# Patient Record
Sex: Female | Born: 1945 | Race: White | Hispanic: No | Smoking: Former smoker
Health system: Southern US, Community
[De-identification: ages and names within clinical notes are randomized; demographics above are authoritative.]

## PROBLEM LIST (undated history)

## (undated) DIAGNOSIS — I7090 Unspecified atherosclerosis: Secondary | ICD-10-CM

## (undated) DIAGNOSIS — N200 Calculus of kidney: Secondary | ICD-10-CM

## (undated) DIAGNOSIS — J439 Emphysema, unspecified: Secondary | ICD-10-CM

## (undated) DIAGNOSIS — J449 Chronic obstructive pulmonary disease, unspecified: Secondary | ICD-10-CM

## (undated) DIAGNOSIS — R7303 Prediabetes: Secondary | ICD-10-CM

## (undated) DIAGNOSIS — F419 Anxiety disorder, unspecified: Secondary | ICD-10-CM

## (undated) DIAGNOSIS — R030 Elevated blood-pressure reading, without diagnosis of hypertension: Secondary | ICD-10-CM

## (undated) DIAGNOSIS — K219 Gastro-esophageal reflux disease without esophagitis: Secondary | ICD-10-CM

## (undated) DIAGNOSIS — K635 Polyp of colon: Secondary | ICD-10-CM

## (undated) DIAGNOSIS — T7840XA Allergy, unspecified, initial encounter: Secondary | ICD-10-CM

## (undated) DIAGNOSIS — I10 Essential (primary) hypertension: Secondary | ICD-10-CM

## (undated) DIAGNOSIS — E785 Hyperlipidemia, unspecified: Secondary | ICD-10-CM

## (undated) DIAGNOSIS — H269 Unspecified cataract: Secondary | ICD-10-CM

## (undated) DIAGNOSIS — G4700 Insomnia, unspecified: Secondary | ICD-10-CM

## (undated) DIAGNOSIS — M940 Chondrocostal junction syndrome [Tietze]: Secondary | ICD-10-CM

## (undated) HISTORY — DX: Chondrocostal junction syndrome (tietze): M94.0

## (undated) HISTORY — DX: Anxiety disorder, unspecified: F41.9

## (undated) HISTORY — DX: Insomnia, unspecified: G47.00

## (undated) HISTORY — PX: ABDOMINAL HYSTERECTOMY: SHX81

## (undated) HISTORY — DX: Prediabetes: R73.03

## (undated) HISTORY — DX: Essential (primary) hypertension: I10

## (undated) HISTORY — DX: Calculus of kidney: N20.0

## (undated) HISTORY — DX: Allergy, unspecified, initial encounter: T78.40XA

## (undated) HISTORY — PX: POLYPECTOMY: SHX149

## (undated) HISTORY — DX: Unspecified cataract: H26.9

## (undated) HISTORY — DX: Emphysema, unspecified: J43.9

## (undated) HISTORY — DX: Polyp of colon: K63.5

## (undated) HISTORY — PX: OTHER SURGICAL HISTORY: SHX169

## (undated) HISTORY — DX: Gastro-esophageal reflux disease without esophagitis: K21.9

## (undated) HISTORY — DX: Hyperlipidemia, unspecified: E78.5

## (undated) HISTORY — PX: COLONOSCOPY: SHX174

---

## 1998-08-07 ENCOUNTER — Ambulatory Visit (HOSPITAL_COMMUNITY): Admission: RE | Admit: 1998-08-07 | Discharge: 1998-08-07 | Payer: Self-pay | Admitting: Internal Medicine

## 1999-03-15 ENCOUNTER — Ambulatory Visit (HOSPITAL_COMMUNITY): Admission: RE | Admit: 1999-03-15 | Discharge: 1999-03-15 | Payer: Self-pay | Admitting: Internal Medicine

## 1999-03-15 ENCOUNTER — Encounter: Payer: Self-pay | Admitting: Internal Medicine

## 1999-08-12 ENCOUNTER — Ambulatory Visit (HOSPITAL_COMMUNITY): Admission: RE | Admit: 1999-08-12 | Discharge: 1999-08-12 | Payer: Self-pay | Admitting: Internal Medicine

## 1999-08-12 ENCOUNTER — Encounter: Payer: Self-pay | Admitting: Internal Medicine

## 2000-08-18 ENCOUNTER — Ambulatory Visit (HOSPITAL_COMMUNITY): Admission: RE | Admit: 2000-08-18 | Discharge: 2000-08-18 | Payer: Self-pay | Admitting: Internal Medicine

## 2000-08-18 ENCOUNTER — Encounter: Payer: Self-pay | Admitting: Internal Medicine

## 2001-08-26 ENCOUNTER — Encounter: Payer: Self-pay | Admitting: Internal Medicine

## 2001-08-26 ENCOUNTER — Ambulatory Visit (HOSPITAL_COMMUNITY): Admission: RE | Admit: 2001-08-26 | Discharge: 2001-08-26 | Payer: Self-pay | Admitting: Internal Medicine

## 2002-09-19 ENCOUNTER — Encounter: Payer: Self-pay | Admitting: Internal Medicine

## 2002-09-19 ENCOUNTER — Ambulatory Visit (HOSPITAL_COMMUNITY): Admission: RE | Admit: 2002-09-19 | Discharge: 2002-09-19 | Payer: Self-pay | Admitting: Family Medicine

## 2003-02-06 ENCOUNTER — Encounter: Payer: Self-pay | Admitting: Internal Medicine

## 2003-02-06 ENCOUNTER — Ambulatory Visit (HOSPITAL_COMMUNITY): Admission: RE | Admit: 2003-02-06 | Discharge: 2003-02-06 | Payer: Self-pay | Admitting: Internal Medicine

## 2004-01-17 ENCOUNTER — Ambulatory Visit (HOSPITAL_COMMUNITY): Admission: RE | Admit: 2004-01-17 | Discharge: 2004-01-17 | Payer: Self-pay | Admitting: Internal Medicine

## 2005-01-20 ENCOUNTER — Ambulatory Visit (HOSPITAL_COMMUNITY): Admission: RE | Admit: 2005-01-20 | Discharge: 2005-01-20 | Payer: Self-pay | Admitting: Internal Medicine

## 2006-01-23 ENCOUNTER — Ambulatory Visit (HOSPITAL_COMMUNITY): Admission: RE | Admit: 2006-01-23 | Discharge: 2006-01-23 | Payer: Self-pay | Admitting: Internal Medicine

## 2006-08-04 ENCOUNTER — Ambulatory Visit: Payer: Self-pay | Admitting: Gastroenterology

## 2006-08-12 ENCOUNTER — Encounter: Payer: Self-pay | Admitting: Gastroenterology

## 2006-08-12 ENCOUNTER — Ambulatory Visit: Payer: Self-pay | Admitting: Gastroenterology

## 2007-01-25 ENCOUNTER — Ambulatory Visit (HOSPITAL_COMMUNITY): Admission: RE | Admit: 2007-01-25 | Discharge: 2007-01-25 | Payer: Self-pay | Admitting: Internal Medicine

## 2007-10-22 ENCOUNTER — Ambulatory Visit (HOSPITAL_COMMUNITY): Admission: RE | Admit: 2007-10-22 | Discharge: 2007-10-22 | Payer: Self-pay | Admitting: Internal Medicine

## 2008-02-15 ENCOUNTER — Ambulatory Visit (HOSPITAL_COMMUNITY): Admission: RE | Admit: 2008-02-15 | Discharge: 2008-02-15 | Payer: Self-pay | Admitting: Internal Medicine

## 2009-04-02 ENCOUNTER — Ambulatory Visit (HOSPITAL_COMMUNITY): Admission: RE | Admit: 2009-04-02 | Discharge: 2009-04-02 | Payer: Self-pay | Admitting: Internal Medicine

## 2010-04-17 ENCOUNTER — Ambulatory Visit (HOSPITAL_COMMUNITY): Admission: RE | Admit: 2010-04-17 | Discharge: 2010-04-17 | Payer: Self-pay | Admitting: Internal Medicine

## 2011-01-18 ENCOUNTER — Encounter: Payer: Self-pay | Admitting: Internal Medicine

## 2011-03-30 LAB — HM DEXA SCAN

## 2011-04-16 ENCOUNTER — Other Ambulatory Visit (HOSPITAL_COMMUNITY): Payer: Self-pay | Admitting: Internal Medicine

## 2011-04-16 DIAGNOSIS — R05 Cough: Secondary | ICD-10-CM

## 2011-04-16 DIAGNOSIS — Z13820 Encounter for screening for osteoporosis: Secondary | ICD-10-CM

## 2011-04-16 DIAGNOSIS — Z1231 Encounter for screening mammogram for malignant neoplasm of breast: Secondary | ICD-10-CM

## 2011-04-24 ENCOUNTER — Ambulatory Visit (HOSPITAL_COMMUNITY)
Admission: RE | Admit: 2011-04-24 | Discharge: 2011-04-24 | Disposition: A | Discharge status: Home / Self Care | Payer: Medicare Other | Source: Ambulatory Visit | Attending: Internal Medicine | Admitting: Internal Medicine

## 2011-04-24 ENCOUNTER — Ambulatory Visit (HOSPITAL_COMMUNITY): Payer: Self-pay

## 2011-04-24 ENCOUNTER — Other Ambulatory Visit (HOSPITAL_COMMUNITY): Payer: Self-pay

## 2011-04-24 DIAGNOSIS — Z13820 Encounter for screening for osteoporosis: Secondary | ICD-10-CM

## 2011-04-24 DIAGNOSIS — Z1231 Encounter for screening mammogram for malignant neoplasm of breast: Secondary | ICD-10-CM

## 2011-04-24 DIAGNOSIS — R05 Cough: Secondary | ICD-10-CM

## 2011-04-24 DIAGNOSIS — R29890 Loss of height: Secondary | ICD-10-CM | POA: Insufficient documentation

## 2011-05-16 NOTE — Assessment & Plan Note (Signed)
Alison Campbell                           GASTROENTEROLOGY OFFICE NOTE   NAME:Alison Campbell, Alison Campbell                       MRN:          [id number]  DATE:08/04/2006                            DOB:          [birth year]    GI CONSULT:  Mrs. 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  She is referred through the courtesy of Dr. Oneta Rack for  evaluation of a change in bowel habits and a need for colonoscopy exam.   Mrs. Wilmot has had rather regular bowel movements until July of this year  when she became constipated after taking an Imodium because of an episode of  diarrhea.  Since that time, she has had some bloating.  She has had pressure  in the rectum with difficulty evacuating her stool, despite taking fiber  pills, Senokot and stool softeners.  She has had no real abdominal pain,  gas, bloating, melena or hematochezia.  She denies upper GI or hepatobiliary  complaints.  She has never had colonoscopy or barium enema exams, except in  1972 before a hysterectomy, and she apparently had a negative barium enema.  She is status post total abdominal hysterectomy and bilateral oophorectomy  for unclear reasons.   She follows a regular diet and denies any specific food intolerances.  She  has had no anorexia or weight loss, fever, chills or systemic complaints.  She denies abuse of NSAIDs or salicylates, and does not take Celebrex for  COX II inhibitors.   PAST MEDICAL HISTORY:  1.  Mild hypertension.  2.  Hyperlipidemia.  3.  History of fibrocystic breast disease.   MEDICATIONS:  Her only medication at this time was Vytorin 10/80 half a  tablet every other day for hypercholesterolemia.   On review of her records from Dr. Oneta Rack, he does give a history of GERD,  but has not required treatment for such.   ALLERGIES:  She has had reactions in the past to [facility].   FAMILY HISTORY:  Remarkable for breast cancer in her mother.   Family history  is remarkable for atherosclerosis and diabetes.   SOCIAL HISTORY:  The patient is married and lives with her husband.  She has  some college education.  She smoked a pack of cigarettes per day for more  than 40 years.  She denies ethanol abuse.   REVIEW OF SYSTEMS:  Entirely noncontributory.   LABORATORY DATA:  Recent metabolic profile on June 25, 2006 was normal,  except for borderline glucose of 69, and she had a normal liver profile.   PHYSICAL EXAMINATION:  GENERAL:  She is a healthy-appearing middle-aged  white female in no distress, appearing her stated age.  VITAL SIGNS:  She is 5 feet, 4 inches tall and weighs 131 pounds.  Blood  pressure is 122/76 and pulse was 76 and regular.  I could no appreciate  stigmata of chronic liver disease.  NECK:  Her thyroid was palpable but not nodular and nontender.  CHEST:  Clear to auscultation and auscultation.  HEART:  She appeared to be in a regular rhythm without significant  murmurs,  gallops or rubs.  ABDOMEN:  There was no hepatosplenomegaly, abdominal masses or tenderness.  Bowel sounds were normal.  EXTREMITIES:  Peripheral extremities were unremarkable.  MENTAL STATUS:  Clear.  RECTAL:  Inspection of the rectum was unremarkable, as was rectal exam with  rather pasty, sticky stool which was guaiac negative.   ASSESSMENT:  Mrs. Weber has definitely had a change in her bowel pattern  over the last few months.  She is greatly concerned because she has a  husband who has had colon carcinoma, which has benefits metastatic in  nature.  She never had colonoscopy screening.  I certainly agree with this  maneuver at this time.  I suspect some of her constipation is related to  lack of fiber and fluids in her diet.   RECOMMENDATIONS:  1.  Outpatient colonoscopy exam at her convenience.  2.  High fiber diet and would place her on daily Benefiber with liberal p.o.      fluids.  3.  Consider Amitizia 24 mcg twice a day for  constipation.  4.  Other medications as per Dr. Oneta Rack.                                   Vania Rea. Jarold Motto, MD, [place] [place], [place]   DRP/MedQ  DD:  08/04/2006  DT:  08/04/2006  Job #:  [id number]   cc:   Lucky Cowboy, MD

## 2011-08-05 ENCOUNTER — Ambulatory Visit (HOSPITAL_COMMUNITY)
Admission: RE | Admit: 2011-08-05 | Discharge: 2011-08-05 | Disposition: A | Discharge status: Home / Self Care | Payer: Medicare Other | Source: Ambulatory Visit | Attending: Internal Medicine | Admitting: Internal Medicine

## 2011-08-05 ENCOUNTER — Other Ambulatory Visit (HOSPITAL_COMMUNITY): Payer: Self-pay | Admitting: Internal Medicine

## 2011-08-05 DIAGNOSIS — M25529 Pain in unspecified elbow: Secondary | ICD-10-CM | POA: Insufficient documentation

## 2011-08-05 DIAGNOSIS — M25569 Pain in unspecified knee: Secondary | ICD-10-CM

## 2011-08-05 DIAGNOSIS — W19XXXA Unspecified fall, initial encounter: Secondary | ICD-10-CM | POA: Insufficient documentation

## 2011-08-06 ENCOUNTER — Ambulatory Visit (AMBULATORY_SURGERY_CENTER): Payer: Medicare Other | Admitting: *Deleted

## 2011-08-06 DIAGNOSIS — Z86010 Personal history of colonic polyps: Secondary | ICD-10-CM

## 2011-08-06 DIAGNOSIS — Z1211 Encounter for screening for malignant neoplasm of colon: Secondary | ICD-10-CM

## 2011-08-06 MED ORDER — PEG-KCL-NACL-NASULF-NA ASC-C 100 G PO SOLR: 1.0000 | Freq: Once | ORAL | Status: DC

## 2011-08-19 ENCOUNTER — Other Ambulatory Visit: Payer: Self-pay | Admitting: Internal Medicine

## 2011-08-19 ENCOUNTER — Ambulatory Visit
Admission: RE | Admit: 2011-08-19 | Discharge: 2011-08-19 | Disposition: A | Discharge status: Home / Self Care | Payer: Medicare Other | Source: Ambulatory Visit | Attending: Internal Medicine | Admitting: Internal Medicine

## 2011-08-19 ENCOUNTER — Telehealth: Payer: Self-pay | Admitting: Gastroenterology

## 2011-08-19 DIAGNOSIS — H5712 Ocular pain, left eye: Secondary | ICD-10-CM

## 2011-08-19 NOTE — Telephone Encounter (Signed)
Yes

## 2011-08-19 NOTE — Telephone Encounter (Signed)
pts colonoscopy is actually tomorrow 08/20/2011. Do you want to charge???

## 2011-08-19 NOTE — Telephone Encounter (Signed)
Please bill pt for cx of colonoscopy per agreement signed, per Dr Jarold Motto

## 2011-08-20 ENCOUNTER — Other Ambulatory Visit: Payer: Medicare Other | Admitting: Gastroenterology

## 2011-09-10 ENCOUNTER — Ambulatory Visit (AMBULATORY_SURGERY_CENTER): Payer: Medicare Other | Admitting: Gastroenterology

## 2011-09-10 ENCOUNTER — Encounter: Payer: Self-pay | Admitting: Gastroenterology

## 2011-09-10 DIAGNOSIS — K635 Polyp of colon: Secondary | ICD-10-CM

## 2011-09-10 DIAGNOSIS — D126 Benign neoplasm of colon, unspecified: Secondary | ICD-10-CM

## 2011-09-10 DIAGNOSIS — Z1211 Encounter for screening for malignant neoplasm of colon: Secondary | ICD-10-CM

## 2011-09-10 DIAGNOSIS — K5730 Diverticulosis of large intestine without perforation or abscess without bleeding: Secondary | ICD-10-CM

## 2011-09-10 DIAGNOSIS — Z860100 Personal history of colon polyps, unspecified: Secondary | ICD-10-CM

## 2011-09-10 MED ORDER — SODIUM CHLORIDE 0.9 % IV SOLN: 500.0000 mL | INTRAVENOUS | Status: DC

## 2011-09-10 NOTE — Patient Instructions (Signed)
Please refer to blue and green discharge instruction sheets. 

## 2011-09-11 ENCOUNTER — Telehealth: Payer: Self-pay | Admitting: *Deleted

## 2011-09-11 NOTE — Telephone Encounter (Signed)

## 2011-09-17 ENCOUNTER — Encounter: Payer: Self-pay | Admitting: Gastroenterology

## 2012-02-03 DIAGNOSIS — L301 Dyshidrosis [pompholyx]: Secondary | ICD-10-CM | POA: Diagnosis not present

## 2012-04-19 DIAGNOSIS — I10 Essential (primary) hypertension: Secondary | ICD-10-CM | POA: Diagnosis not present

## 2012-04-19 DIAGNOSIS — E559 Vitamin D deficiency, unspecified: Secondary | ICD-10-CM | POA: Diagnosis not present

## 2012-04-19 DIAGNOSIS — R7309 Other abnormal glucose: Secondary | ICD-10-CM | POA: Diagnosis not present

## 2012-04-19 DIAGNOSIS — Z79899 Other long term (current) drug therapy: Secondary | ICD-10-CM | POA: Diagnosis not present

## 2012-04-19 DIAGNOSIS — Z1212 Encounter for screening for malignant neoplasm of rectum: Secondary | ICD-10-CM | POA: Diagnosis not present

## 2012-04-19 DIAGNOSIS — E782 Mixed hyperlipidemia: Secondary | ICD-10-CM | POA: Diagnosis not present

## 2012-04-20 ENCOUNTER — Other Ambulatory Visit (HOSPITAL_COMMUNITY): Payer: Self-pay | Admitting: Internal Medicine

## 2012-04-20 ENCOUNTER — Other Ambulatory Visit: Payer: Self-pay | Admitting: Internal Medicine

## 2012-04-20 DIAGNOSIS — Z1231 Encounter for screening mammogram for malignant neoplasm of breast: Secondary | ICD-10-CM

## 2012-04-23 ENCOUNTER — Ambulatory Visit (HOSPITAL_COMMUNITY)
Admission: RE | Admit: 2012-04-23 | Discharge: 2012-04-23 | Disposition: A | Discharge status: Home / Self Care | Payer: Medicare Other | Source: Ambulatory Visit | Attending: Internal Medicine | Admitting: Internal Medicine

## 2012-04-23 ENCOUNTER — Other Ambulatory Visit (HOSPITAL_COMMUNITY): Payer: Self-pay | Admitting: Internal Medicine

## 2012-04-23 DIAGNOSIS — J438 Other emphysema: Secondary | ICD-10-CM | POA: Insufficient documentation

## 2012-04-23 DIAGNOSIS — I10 Essential (primary) hypertension: Secondary | ICD-10-CM | POA: Insufficient documentation

## 2012-04-23 DIAGNOSIS — J984 Other disorders of lung: Secondary | ICD-10-CM | POA: Diagnosis not present

## 2012-04-23 DIAGNOSIS — Z0000 Encounter for general adult medical examination without abnormal findings: Secondary | ICD-10-CM | POA: Insufficient documentation

## 2012-04-28 ENCOUNTER — Ambulatory Visit
Admission: RE | Admit: 2012-04-28 | Discharge: 2012-04-28 | Disposition: A | Discharge status: Home / Self Care | Payer: Medicare Other | Source: Ambulatory Visit | Attending: Internal Medicine | Admitting: Internal Medicine

## 2012-04-28 DIAGNOSIS — Z1231 Encounter for screening mammogram for malignant neoplasm of breast: Secondary | ICD-10-CM | POA: Diagnosis not present

## 2012-09-29 DIAGNOSIS — Z23 Encounter for immunization: Secondary | ICD-10-CM | POA: Diagnosis not present

## 2012-11-10 DIAGNOSIS — R935 Abnormal findings on diagnostic imaging of other abdominal regions, including retroperitoneum: Secondary | ICD-10-CM | POA: Diagnosis not present

## 2012-11-10 DIAGNOSIS — R109 Unspecified abdominal pain: Secondary | ICD-10-CM | POA: Diagnosis not present

## 2012-11-30 DIAGNOSIS — H6690 Otitis media, unspecified, unspecified ear: Secondary | ICD-10-CM | POA: Diagnosis not present

## 2012-12-07 DIAGNOSIS — M949 Disorder of cartilage, unspecified: Secondary | ICD-10-CM | POA: Diagnosis not present

## 2012-12-07 DIAGNOSIS — E559 Vitamin D deficiency, unspecified: Secondary | ICD-10-CM | POA: Diagnosis not present

## 2012-12-07 DIAGNOSIS — E785 Hyperlipidemia, unspecified: Secondary | ICD-10-CM | POA: Diagnosis not present

## 2012-12-13 DIAGNOSIS — E785 Hyperlipidemia, unspecified: Secondary | ICD-10-CM | POA: Diagnosis not present

## 2012-12-13 DIAGNOSIS — E559 Vitamin D deficiency, unspecified: Secondary | ICD-10-CM | POA: Diagnosis not present

## 2013-01-19 DIAGNOSIS — H6690 Otitis media, unspecified, unspecified ear: Secondary | ICD-10-CM | POA: Diagnosis not present

## 2013-01-31 DIAGNOSIS — H9209 Otalgia, unspecified ear: Secondary | ICD-10-CM | POA: Diagnosis not present

## 2013-02-21 DIAGNOSIS — H60399 Other infective otitis externa, unspecified ear: Secondary | ICD-10-CM | POA: Diagnosis not present

## 2013-08-24 DIAGNOSIS — L301 Dyshidrosis [pompholyx]: Secondary | ICD-10-CM | POA: Diagnosis not present

## 2013-09-20 DIAGNOSIS — R030 Elevated blood-pressure reading, without diagnosis of hypertension: Secondary | ICD-10-CM | POA: Diagnosis not present

## 2013-09-20 DIAGNOSIS — R5381 Other malaise: Secondary | ICD-10-CM | POA: Diagnosis not present

## 2013-09-20 DIAGNOSIS — E782 Mixed hyperlipidemia: Secondary | ICD-10-CM | POA: Diagnosis not present

## 2013-09-20 DIAGNOSIS — E559 Vitamin D deficiency, unspecified: Secondary | ICD-10-CM | POA: Diagnosis not present

## 2013-09-20 DIAGNOSIS — Z23 Encounter for immunization: Secondary | ICD-10-CM | POA: Diagnosis not present

## 2013-09-20 DIAGNOSIS — Z111 Encounter for screening for respiratory tuberculosis: Secondary | ICD-10-CM | POA: Diagnosis not present

## 2013-09-20 DIAGNOSIS — R7309 Other abnormal glucose: Secondary | ICD-10-CM | POA: Diagnosis not present

## 2013-09-20 DIAGNOSIS — Z1212 Encounter for screening for malignant neoplasm of rectum: Secondary | ICD-10-CM | POA: Diagnosis not present

## 2013-09-20 DIAGNOSIS — Z79899 Other long term (current) drug therapy: Secondary | ICD-10-CM | POA: Diagnosis not present

## 2013-09-23 ENCOUNTER — Other Ambulatory Visit (HOSPITAL_COMMUNITY): Payer: Self-pay | Admitting: Internal Medicine

## 2013-09-23 DIAGNOSIS — Z1231 Encounter for screening mammogram for malignant neoplasm of breast: Secondary | ICD-10-CM

## 2013-09-23 DIAGNOSIS — M810 Age-related osteoporosis without current pathological fracture: Secondary | ICD-10-CM

## 2013-09-23 DIAGNOSIS — Z720 Tobacco use: Secondary | ICD-10-CM

## 2013-09-27 DIAGNOSIS — H905 Unspecified sensorineural hearing loss: Secondary | ICD-10-CM | POA: Diagnosis not present

## 2013-09-27 DIAGNOSIS — H903 Sensorineural hearing loss, bilateral: Secondary | ICD-10-CM | POA: Diagnosis not present

## 2013-10-13 ENCOUNTER — Ambulatory Visit (HOSPITAL_COMMUNITY)
Admission: RE | Admit: 2013-10-13 | Discharge: 2013-10-13 | Disposition: A | Discharge status: Home / Self Care | Payer: Medicare Other | Source: Ambulatory Visit | Attending: Internal Medicine | Admitting: Internal Medicine

## 2013-10-13 DIAGNOSIS — F17200 Nicotine dependence, unspecified, uncomplicated: Secondary | ICD-10-CM | POA: Diagnosis not present

## 2013-10-13 DIAGNOSIS — I700 Atherosclerosis of aorta: Secondary | ICD-10-CM | POA: Diagnosis not present

## 2013-10-13 DIAGNOSIS — R091 Pleurisy: Secondary | ICD-10-CM | POA: Diagnosis not present

## 2013-10-13 DIAGNOSIS — Z1231 Encounter for screening mammogram for malignant neoplasm of breast: Secondary | ICD-10-CM | POA: Insufficient documentation

## 2013-10-13 DIAGNOSIS — M810 Age-related osteoporosis without current pathological fracture: Secondary | ICD-10-CM | POA: Diagnosis not present

## 2013-10-13 DIAGNOSIS — Z13820 Encounter for screening for osteoporosis: Secondary | ICD-10-CM | POA: Insufficient documentation

## 2013-10-13 DIAGNOSIS — Z780 Asymptomatic menopausal state: Secondary | ICD-10-CM | POA: Diagnosis not present

## 2013-10-13 DIAGNOSIS — J449 Chronic obstructive pulmonary disease, unspecified: Secondary | ICD-10-CM | POA: Insufficient documentation

## 2013-10-13 DIAGNOSIS — Z720 Tobacco use: Secondary | ICD-10-CM

## 2013-10-13 DIAGNOSIS — J4489 Other specified chronic obstructive pulmonary disease: Secondary | ICD-10-CM | POA: Insufficient documentation

## 2013-10-18 ENCOUNTER — Other Ambulatory Visit: Payer: Self-pay | Admitting: Internal Medicine

## 2013-10-18 DIAGNOSIS — R928 Other abnormal and inconclusive findings on diagnostic imaging of breast: Secondary | ICD-10-CM

## 2013-11-02 ENCOUNTER — Other Ambulatory Visit: Payer: Self-pay | Admitting: Internal Medicine

## 2013-11-02 ENCOUNTER — Ambulatory Visit
Admission: RE | Admit: 2013-11-02 | Discharge: 2013-11-02 | Disposition: A | Discharge status: Home / Self Care | Payer: Medicare Other | Source: Ambulatory Visit | Attending: Internal Medicine | Admitting: Internal Medicine

## 2013-11-02 DIAGNOSIS — R928 Other abnormal and inconclusive findings on diagnostic imaging of breast: Secondary | ICD-10-CM

## 2013-11-02 DIAGNOSIS — N6009 Solitary cyst of unspecified breast: Secondary | ICD-10-CM | POA: Diagnosis not present

## 2013-11-04 ENCOUNTER — Encounter: Payer: Self-pay | Admitting: Internal Medicine

## 2013-11-07 ENCOUNTER — Ambulatory Visit: Payer: Self-pay | Admitting: Internal Medicine

## 2013-11-07 ENCOUNTER — Encounter: Payer: Self-pay | Admitting: Internal Medicine

## 2013-11-07 VITALS — BP 130/76 | HR 68 | Temp 97.7°F | Resp 18 | Ht 63.5 in | Wt 138.6 lb

## 2013-11-07 DIAGNOSIS — I10 Essential (primary) hypertension: Secondary | ICD-10-CM | POA: Insufficient documentation

## 2013-11-07 DIAGNOSIS — R7309 Other abnormal glucose: Secondary | ICD-10-CM | POA: Insufficient documentation

## 2013-11-07 DIAGNOSIS — J42 Unspecified chronic bronchitis: Secondary | ICD-10-CM | POA: Insufficient documentation

## 2013-11-07 DIAGNOSIS — E559 Vitamin D deficiency, unspecified: Secondary | ICD-10-CM | POA: Insufficient documentation

## 2013-11-07 DIAGNOSIS — E782 Mixed hyperlipidemia: Secondary | ICD-10-CM | POA: Diagnosis not present

## 2013-11-07 DIAGNOSIS — J449 Chronic obstructive pulmonary disease, unspecified: Secondary | ICD-10-CM

## 2013-11-07 DIAGNOSIS — Z79899 Other long term (current) drug therapy: Secondary | ICD-10-CM | POA: Diagnosis not present

## 2013-11-07 NOTE — Patient Instructions (Signed)
Continue diet and meds as discussed. Further disposition pending results of labs. Cholesterol Cholesterol is a white, waxy, fat-like protein needed by your body in small amounts. The liver makes all the cholesterol you need. It is carried from the liver by the blood through the blood vessels. Deposits (plaque) may build up on blood vessel walls. This makes the arteries narrower and stiffer. Plaque increases the risk for heart attack and stroke. You cannot feel your cholesterol level even if it is very high. The only way to know is by a blood test to check your lipid (fats) levels. Once you know your cholesterol levels, you should keep a record of the test results. Work with your caregiver to to keep your levels in the desired range. WHAT THE RESULTS MEAN:  Total cholesterol is a rough measure of all the cholesterol in your blood.  LDL is the so-called bad cholesterol. This is the type that deposits cholesterol in the walls of the arteries. You want this level to be low.  HDL is the good cholesterol because it cleans the arteries and carries the LDL away. You want this level to be high.  Triglycerides are fat that the body can either burn for energy or store. High levels are closely linked to heart disease. DESIRED LEVELS:  Total cholesterol below 200.  LDL below 100 for people at risk, below 70 for very high risk.  HDL above 50 is good, above 60 is best.  Triglycerides below 150. HOW TO LOWER YOUR CHOLESTEROL:  Diet.  Choose fish or white meat chicken and [place], roasted or baked. Limit fatty cuts of red meat, fried foods, and processed meats, such as sausage and lunch meat.  Eat lots of fresh fruits and vegetables. Choose whole grains, beans, pasta, potatoes and cereals.  Use only small amounts of olive, corn or canola oils. Avoid butter, mayonnaise, shortening or palm kernel oils. Avoid foods with trans-fats.  Use skim/nonfat milk and low-fat/nonfat yogurt and cheeses. Avoid whole  milk, cream, ice cream, egg yolks and cheeses. Healthy desserts include angel food cake, ginger snaps, animal crackers, hard candy, popsicles, and low-fat/nonfat frozen yogurt. Avoid pastries, cakes, pies and cookies.  Exercise.  A regular program helps decrease LDL and raises HDL.  Helps with weight control.  Do things that increase your activity level like gardening, walking, or taking the stairs.  Medication.  May be prescribed by your caregiver to help lowering cholesterol and the risk for heart disease.  You may need medicine even if your levels are normal if you have several risk factors. HOME CARE INSTRUCTIONS   Follow your diet and exercise programs as suggested by your caregiver.  Take medications as directed.  Have blood work done when your caregiver feels it is necessary. MAKE SURE YOU:   Understand these instructions.  Will watch your condition.  Will get help right away if you are not doing well or get worse. Document Released: 09/09/2001 Document Revised: 03/08/2012 Document Reviewed: 03/01/2008 [facility] Patient Information 2014 [place], [place].

## 2013-11-07 NOTE — Progress Notes (Signed)
Patient ID: Alison Campbell, female   DOB: [birth year], 67 y.o.   MRN: [id number]  HPI: Patient presents for 6 week follow up ,hyperlipidemia with hx/o labile hypertension  Hyperlipidemia is controlled with recent switch to pravastatin for cost concerns. Patient denies myalgias or other med SE's.   BP has been controlled at home. Today's BP is 130/76. Patient denies any cardiac type chest pain, palpitations, dyspnea/orthopnea/PND, dizziness, claudication, or dependent edema.     Medication List              ALPRAZOLAM PO  Take 0.5 tablets by mouth as needed.     CALCIUM PO  Take 2 tablets by mouth daily. 600 mg  2 per day     FOSAMAX PO  Take 1 tablet by mouth once a week.     MULTIVITAMIN PO  Take 2 tablets by mouth daily.     pravastatin 40 MG tablet  Commonly known as:  PRAVACHOL  Take 40 mg by mouth daily.     VITAMIN D PO  Take 1 tablet by mouth daily.     VITAMIN E PO  Take 1 tablet by mouth daily.                Allergies  Allergen Reactions  . Fish Oil Diarrhea    Systems Review: Constitutional: Denies fever, chills, wt changes, headaches, insomnia, fatigue, night sweats, change in appetite. Eyes: Denies redness, blurred vision, diplopia, discharge, itchy, watery eyes.  ENT: Denies discharge, congestion, post nasal drip, epistaxis, sore throat, earache, hearing loss, dental pain, Tinnitus, Vertigo, Sinus pain, snoring.  CV: Denies chest pain, palpitations, irregular heartbeat, syncope, dyspnea, diaphoresis, orthopnea, PND, claudication, edema Respiratory: denies cough, dyspnea, DOE, pleurisy, hoarseness, laryngitis, wheezing.  Gastrointestinal: Denies dysphagia, odynophagia, heartburn, reflux, water brash, pain, cramps, nausea, vomiting, bloating, diarrhea, constipation, hematemesis, melena, hematochezia, jaundice, hemorrhoids Genitourinary: Denies dysuria, frequency, urgency, nocturia, hesitancy, discharge, hematuria, flank pain Musculoskeletal: Denies  arthralgias, myalgias, stiffness, Jt. Swelling, pain, limp, strain/sprain.  Skin: Denies pruritus, rash, hives, warts, acne, eczema, change in skin lesion(s) Neuro: No weakness, tremor, incoordination, spasms, paresthesia, pain Psychiatric: Denies confusion, memory loss, sensory loss Endo: Denies change in weight, skin, hair change, nocturia, diabetic polys, paresthesias, visual blurring, hyper/hypo-glycemic episodes.  Heme/Lymph: Excessive bleeding, bruising, enlarged lymph nodes  PMHx: Labile HTN, Hyperlipidemia, COPD/Chronic Bronchitis, Prediabetes, Vitamin D Deficiency, Colon Polyps, GERD, IBS, kedney stones, and osteoporosis.  FHx: Reviewed / unchanged  SHx: Reviewed / unchanged  Filed Vitals:   11/07/13 1346  BP: 130/76  Pulse: 68  Temp: 97.7 F (36.5 C)  Resp: 18    Estimated body mass index is 24.16 kg/(m^2) as calculated from the following:   Height as of this encounter: 5' 3.5" (1.613 m).   Weight as of this encounter: 138 lb 9.6 oz (62.869 kg).  On Exam: Appears well nourished - in no distress. HEENT: WNL Neck: Supple. Car 2+/2+. Thyroid nl. No LN, bruits or JVD. Chest: Respirations nl, clear  & equal BS w/o  rales, rhonchi, wheezing or stridor.  Cor: Heart sounds normal w/ regular rate and rhythm without sig. murmurs, gallops,clicks, or rubs. Peripheral pulses normal and equal  without edema.  Musculoskeletal: Full ROM all peripheral extremities, joint stability, 5/5 strength, and normal gait.  Skin: Warm, dry without exposed rashes, lesions, ecchymosis apparent.  Neuro: Cranial nerves intact, reflexes equal bilaterally. Sensory-motor testing grossly intact. Tendon reflexes grossly intact.  Pysch: Alert & oriented x 3. Insight and judgement nl & appropriate. No ideations.  Assessment and Plan:  1. Hypertension - Continue medication, monitor blood pressure at home. Continue diet/meds.  2. Hyperlipidemia - Continue diet/meds, exercise,& lifestyle modifications.  Continue monitor cholesterol/LFT's.   3. Pre-diabetes/Insulin Resistance - Continue diet, exercise, lifestyle modifications. Monitor appropriate labs.  4. Vitamin D Deficiency - Continue supplementation.

## 2013-11-08 LAB — HEPATIC FUNCTION PANEL
AST: 14 U/L (ref 0–37)
Albumin: 4.1 g/dL (ref 3.5–5.2)
Alkaline Phosphatase: 50 U/L (ref 39–117)
Total Bilirubin: 0.2 mg/dL — ABNORMAL LOW (ref 0.3–1.2)

## 2013-11-08 LAB — LIPID PANEL: HDL: 42 mg/dL (ref >39)

## 2014-01-04 ENCOUNTER — Ambulatory Visit (INDEPENDENT_AMBULATORY_CARE_PROVIDER_SITE_OTHER): Payer: Medicare Other | Admitting: Internal Medicine

## 2014-01-04 ENCOUNTER — Encounter: Payer: Self-pay | Admitting: Internal Medicine

## 2014-01-04 VITALS — BP 140/82 | HR 76 | Temp 97.7°F | Resp 16 | Wt 143.2 lb

## 2014-01-04 DIAGNOSIS — E559 Vitamin D deficiency, unspecified: Secondary | ICD-10-CM

## 2014-01-04 DIAGNOSIS — I10 Essential (primary) hypertension: Secondary | ICD-10-CM

## 2014-01-04 DIAGNOSIS — E782 Mixed hyperlipidemia: Secondary | ICD-10-CM

## 2014-01-04 DIAGNOSIS — Z79899 Other long term (current) drug therapy: Secondary | ICD-10-CM

## 2014-01-04 DIAGNOSIS — M159 Polyosteoarthritis, unspecified: Secondary | ICD-10-CM

## 2014-01-04 DIAGNOSIS — R7309 Other abnormal glucose: Secondary | ICD-10-CM

## 2014-01-04 DIAGNOSIS — L259 Unspecified contact dermatitis, unspecified cause: Secondary | ICD-10-CM

## 2014-01-04 DIAGNOSIS — F411 Generalized anxiety disorder: Secondary | ICD-10-CM

## 2014-01-04 LAB — CBC WITH DIFFERENTIAL/PLATELET
Basophils Absolute: 0 10*3/uL (ref 0.0–0.1)
Basophils Relative: 0 % (ref 0–1)
Eosinophils Absolute: 0.1 10*3/uL (ref 0.0–0.7)
Eosinophils Relative: 1 % (ref 0–5)
HCT: 41.4 % (ref 36.0–46.0)
HEMOGLOBIN: 13.8 g/dL (ref 12.0–15.0)
LYMPHS ABS: 2.2 10*3/uL (ref 0.7–4.0)
LYMPHS PCT: 25 % (ref 12–46)
MCH: 30.3 pg (ref 26.0–34.0)
MCHC: 33.3 g/dL (ref 30.0–36.0)
MCV: 90.8 fL (ref 78.0–100.0)
Monocytes Absolute: 0.4 10*3/uL (ref 0.1–1.0)
Monocytes Relative: 5 % (ref 3–12)
NEUTROS ABS: 6.1 10*3/uL (ref 1.7–7.7)
NEUTROS PCT: 69 % (ref 43–77)
PLATELETS: 302 10*3/uL (ref 150–400)
RBC: 4.56 MIL/uL (ref 3.87–5.11)
RDW: 13.4 % (ref 11.5–15.5)
WBC: 8.8 10*3/uL (ref 4.0–10.5)

## 2014-01-04 LAB — HEMOGLOBIN A1C
HEMOGLOBIN A1C: 5.5 % (ref <5.7)
MEAN PLASMA GLUCOSE: 111 mg/dL (ref <117)

## 2014-01-04 MED ORDER — MELOXICAM 15 MG PO TABS: ORAL_TABLET | ORAL | Status: DC

## 2014-01-04 MED ORDER — TRIAMCINOLONE ACETONIDE 0.1 % EX CREA: 1.0000 "application " | TOPICAL_CREAM | Freq: Two times a day (BID) | CUTANEOUS | Status: DC

## 2014-01-04 MED ORDER — ALPRAZOLAM 1 MG PO TABS: ORAL_TABLET | ORAL | Status: DC

## 2014-01-04 NOTE — Patient Instructions (Signed)

## 2014-01-04 NOTE — Progress Notes (Signed)
Patient ID: Alison Campbell, female   DOB: 15-Nov-1946, 68 y.o.   MRN: [id number]   This very nice 68 y.o. MWF presents for 3 month follow up with Hypertension, Hyperlipidemia, Pre-Diabetes and Vitamin D Deficiency.    Patient has labile HTN for several years being monitored expectantly. Today's BP: 140/82 mmHg . Patient denies any cardiac type chest pain, palpitations, dyspnea/orthopnea/PND, dizziness, claudication, or dependent edema.   Hyperlipidemia is controlled with diet. Last Cholesterol was  205, Triglycerides were 222, HDL 50 and LDL 44 - at goal . Patient denies myalgias or other med SE's.    Also, the patient has history of PreDiabetes with A1c 6.0% in Apr 2011 and with last A1c of 5.6% in Sept 2014. Patient denies any symptoms of reactive hypoglycemia, diabetic polys, paresthesias or visual blurring.   Further, Patient has history of Vitamin D Deficiency of 31 in 2008 and with last vitamin D of 52 in Sept 2014. Patient supplements vitamin D without any suspected side-effects.  Medication Sig Dispense Refill  . Alendronate Sodium (FOSAMAX PO) Take 1 tablet by mouth once a week.        Marland Kitchen CALCIUM PO Take 2 tablets by mouth daily. 600 mg  2 per day      . Cholecalciferol (VITAMIN D PO) Take 2,000 Units by mouth 2 (two) times daily.       . Multiple Vitamin (MULTIVITAMIN PO) Take 2 tablets by mouth daily.        . pravastatin (PRAVACHOL) 40 MG tablet Take 40 mg by mouth daily.         Allergies  Allergen Reactions  . Fish Oil Diarrhea    PMHx:   Past Medical History  Diagnosis Date  . Hyperlipidemia   . Osteoporosis   . Emphysema of lung   . Hypertension   . GERD (gastroesophageal reflux disease)   . Pre-diabetes   . Kidney stones   . Colon polyps   . Anxiety   . Insomnia     FHx:    Reviewed / unchanged  SHx:    Reviewed / unchanged  Systems Review: Constitutional: Denies fever, chills, wt changes, headaches, insomnia, fatigue, night sweats, change in appetite. Eyes:  Denies redness, blurred vision, diplopia, discharge, itchy, watery eyes.  ENT: Denies discharge, congestion, post nasal drip, epistaxis, sore throat, earache, hearing loss, dental pain, tinnitus, vertigo, sinus pain, snoring.  CV: Denies chest pain, palpitations, irregular heartbeat, syncope, dyspnea, diaphoresis, orthopnea, PND, claudication, edema. Respiratory: denies cough, dyspnea, DOE, pleurisy, hoarseness, laryngitis, wheezing.  Gastrointestinal: Denies dysphagia, odynophagia, heartburn, reflux, water brash, abdominal pain or cramps, nausea, vomiting, bloating, diarrhea, constipation, hematemesis, melena, hematochezia,  or hemorrhoids. Genitourinary: Denies dysuria, frequency, urgency, nocturia, hesitancy, discharge, hematuria, flank pain. Musculoskeletal: Denies arthralgias, myalgias, stiffness, jt. swelling, pain, limp, strain/sprain.  Skin: Denies pruritus, rash, hives, warts, acne, eczema, change in skin lesion(s). Neuro: No weakness, tremor, incoordination, spasms, paresthesia, or pain. Psychiatric: Denies confusion, memory loss, or sensory loss. Endo: Denies change in weight, skin, hair change.  Heme/Lymph: No excessive bleeding, bruising, orenlarged lymph nodes.  BP: 140/82  Pulse: 76  Temp: 97.7 F (36.5 C)  Resp: 16    Estimated body mass index is 24.97 kg/(m^2) as calculated from the following:   Height as of 11/07/13: 5' 3.5" (1.613 m).   Weight as of this encounter: 143 lb 3.2 oz (64.955 kg).  On Exam: Appears well nourished - in no distress. Eyes: PERRLA, EOMs, conjunctiva no swelling or erythema. Sinuses: No frontal/maxillary  tenderness ENT/Mouth: EAC's clear, TM's nl w/o erythema, bulging. Nares clear w/o erythema, swelling, exudates. Oropharynx clear without erythema or exudates. Oral hygiene is good. Tongue normal, non obstructing. Hearing intact.  Neck: Supple. Thyroid nl. Car 2+/2+ without bruits, nodes or JVD. Chest: Respirations nl with BS clear & equal w/o  rales, rhonchi, wheezing or stridor.  Cor: Heart sounds normal w/ regular rate and rhythm without sig. murmurs, gallops, clicks, or rubs. Peripheral pulses normal and equal  without edema.  Abdomen: Soft & bowel sounds normal. Non-tender w/o guarding, rebound, hernias, masses, or organomegaly.  Lymphatics: Unremarkable.  Musculoskeletal: Full ROM all peripheral extremities, joint stability, 5/5 strength, and normal gait.  Skin: Warm, dry without exposed rashes, lesions, ecchymosis apparent.  Neuro: Cranial nerves intact, reflexes equal bilaterally. Sensory-motor testing grossly intact. Tendon reflexes grossly intact.  Pysch: Alert & oriented x 3. Insight and judgement nl & appropriate. No ideations.  Assessment and Plan:  1. Hypertension - Continue monitor blood pressure at home. Continue diet/meds same.  2. Hyperlipidemia - Continue diet, exercise,& lifestyle modifications. Continue monitor periodic cholesterol/liver & renal functions   3. Pre-diabetes - Continue diet, exercise, lifestyle modifications. Monitor appropriate labs.  4. Vitamin D Deficiency - Continue supplementation.  Recommended regular exercise, BP monitoring, weight control, and discussed med and SE's. Recommended labs to assess and monitor clinical status. Further disposition pending results of labs.

## 2014-01-05 LAB — HEPATIC FUNCTION PANEL
ALBUMIN: 4.4 g/dL (ref 3.5–5.2)
ALK PHOS: 55 U/L (ref 39–117)
ALT: 16 U/L (ref 0–35)
AST: 16 U/L (ref 0–37)
Bilirubin, Direct: 0.1 mg/dL (ref 0.0–0.3)
Indirect Bilirubin: 0.2 mg/dL (ref 0.0–0.9)
TOTAL PROTEIN: 7.2 g/dL (ref 6.0–8.3)
Total Bilirubin: 0.3 mg/dL (ref 0.3–1.2)

## 2014-01-05 LAB — BASIC METABOLIC PANEL WITH GFR
BUN: 16 mg/dL (ref 6–23)
CHLORIDE: 106 meq/L (ref 96–112)
CO2: 26 meq/L (ref 19–32)
CREATININE: 0.97 mg/dL (ref 0.50–1.10)
Calcium: 9.5 mg/dL (ref 8.4–10.5)
GFR, Est African American: 69 mL/min
GFR, Est Non African American: 60 mL/min
GLUCOSE: 85 mg/dL (ref 70–99)
Potassium: 4.2 mEq/L (ref 3.5–5.3)
Sodium: 140 mEq/L (ref 135–145)

## 2014-01-05 LAB — LIPID PANEL
Cholesterol: 169 mg/dL (ref 0–200)
HDL: 44 mg/dL (ref >39)
LDL CALC: 74 mg/dL (ref 0–99)
Total CHOL/HDL Ratio: 3.8 Ratio
Triglycerides: 256 mg/dL — ABNORMAL HIGH (ref <150)
VLDL: 51 mg/dL — ABNORMAL HIGH (ref 0–40)

## 2014-01-05 LAB — TSH: TSH: 0.545 u[IU]/mL (ref 0.350–4.500)

## 2014-01-05 LAB — MAGNESIUM: Magnesium: 2 mg/dL (ref 1.5–2.5)

## 2014-01-05 LAB — INSULIN, FASTING: INSULIN FASTING, SERUM: 12 u[IU]/mL (ref 3–28)

## 2014-02-12 ENCOUNTER — Encounter: Payer: Self-pay | Admitting: Internal Medicine

## 2014-02-27 DIAGNOSIS — L301 Dyshidrosis [pompholyx]: Secondary | ICD-10-CM | POA: Diagnosis not present

## 2014-03-06 DIAGNOSIS — S61409A Unspecified open wound of unspecified hand, initial encounter: Secondary | ICD-10-CM | POA: Diagnosis not present

## 2014-04-04 ENCOUNTER — Telehealth: Payer: Self-pay | Admitting: *Deleted

## 2014-04-04 NOTE — Telephone Encounter (Signed)
Pharmacy called with label error.  RX from 12/2013 said Xanax 1 mg 1/2 to 1 tab 2-3 times a day and pharmacy labeled bottle 1/2 - 1 tab 3 times a day.  Dr Melford Aase aware and OK for 3 times a day.

## 2014-04-05 ENCOUNTER — Encounter: Payer: Self-pay | Admitting: Emergency Medicine

## 2014-04-05 ENCOUNTER — Ambulatory Visit (INDEPENDENT_AMBULATORY_CARE_PROVIDER_SITE_OTHER): Payer: Medicare Other | Admitting: Emergency Medicine

## 2014-04-05 VITALS — BP 126/64 | HR 72 | Temp 98.0°F | Resp 16 | Ht 63.5 in | Wt 140.0 lb

## 2014-04-05 DIAGNOSIS — E782 Mixed hyperlipidemia: Secondary | ICD-10-CM

## 2014-04-05 DIAGNOSIS — Z0000 Encounter for general adult medical examination without abnormal findings: Secondary | ICD-10-CM

## 2014-04-05 DIAGNOSIS — Z1331 Encounter for screening for depression: Secondary | ICD-10-CM

## 2014-04-05 DIAGNOSIS — R7309 Other abnormal glucose: Secondary | ICD-10-CM

## 2014-04-05 DIAGNOSIS — Z789 Other specified health status: Secondary | ICD-10-CM

## 2014-04-05 DIAGNOSIS — E559 Vitamin D deficiency, unspecified: Secondary | ICD-10-CM | POA: Diagnosis not present

## 2014-04-05 DIAGNOSIS — Z79899 Other long term (current) drug therapy: Secondary | ICD-10-CM

## 2014-04-05 LAB — CBC WITH DIFFERENTIAL/PLATELET
BASOS PCT: 0 % (ref 0–1)
Basophils Absolute: 0 10*3/uL (ref 0.0–0.1)
EOS PCT: 2 % (ref 0–5)
Eosinophils Absolute: 0.2 10*3/uL (ref 0.0–0.7)
HEMATOCRIT: 42.4 % (ref 36.0–46.0)
HEMOGLOBIN: 14.4 g/dL (ref 12.0–15.0)
Lymphocytes Relative: 22 % (ref 12–46)
Lymphs Abs: 2.3 10*3/uL (ref 0.7–4.0)
MCH: 30.4 pg (ref 26.0–34.0)
MCHC: 34 g/dL (ref 30.0–36.0)
MCV: 89.5 fL (ref 78.0–100.0)
MONO ABS: 0.6 10*3/uL (ref 0.1–1.0)
MONOS PCT: 6 % (ref 3–12)
NEUTROS ABS: 7.3 10*3/uL (ref 1.7–7.7)
Neutrophils Relative %: 70 % (ref 43–77)
Platelets: 275 10*3/uL (ref 150–400)
RBC: 4.74 MIL/uL (ref 3.87–5.11)
RDW: 13.4 % (ref 11.5–15.5)
WBC: 10.4 10*3/uL (ref 4.0–10.5)

## 2014-04-05 LAB — HEMOGLOBIN A1C
Hgb A1c MFr Bld: 5.6 % (ref <5.7)
Mean Plasma Glucose: 114 mg/dL (ref <117)

## 2014-04-05 NOTE — Progress Notes (Signed)
Patient ID: Alison Campbell, female   DOB: January 26, 1946, 68 y.o.   MRN: [id number] Subjective:   Alison Campbell is a 68 y.o. female who presents for Medicare Annual Wellness Visit and 3 month follow up on hypertension, prediabetes, hyperlipidemia, vitamin D def.  Date of last medicare wellness visit is unknown.   Patient notes BP is controlled with diet/exercise she has not been on BP RX. She has had elevated readings in the past but not consecutive.  Her blood pressure has been controlled at home, today their BP is BP: 126/64 mmHg She does workout. She denies chest pain, shortness of breath, dizziness.  She is on cholesterol medication and denies myalgias. Her cholesterol is at goal. The cholesterol last visit was:   Lab Results  Component Value Date   CHOL 170 04/05/2014   HDL 47 04/05/2014   LDLCALC 89 04/05/2014   TRIG 168* 04/05/2014   CHOLHDL 3.6 04/05/2014   She has been working on diet and exercise for prediabetes, and denies foot ulcerations, paresthesia of the feet and polydipsia. Last A1C in the office was:  Lab Results  Component Value Date   HGBA1C 5.6 04/05/2014   Patient is on Vitamin D supplement.  Names of Other Physician/Practitioners you currently use: Patient Care Team: [name], MD as PCP - General (Internal Medicine) Milus Banister, MD as Attending Physician (Gastroenterology) Sable Feil, MD as Consulting Physician (Gastroenterology) Rozetta Nunnery, MD as Consulting Physician (Otolaryngology) Simona Huh, MD as Consulting Physician (Dermatology) [facility], [facility])  Medication Review Current Outpatient Prescriptions on File Prior to Visit  Medication Sig Dispense Refill  . Alendronate Sodium (FOSAMAX PO) Take 1 tablet by mouth once a week.        . ALPRAZolam (XANAX) 1 MG tablet 1/2 to 1 tablet 2 to 3 x daily as needed for anxiety or sleep  90 tablet  5  . CALCIUM PO Take 2 tablets by mouth daily. 600 mg  2 per day      .  Cholecalciferol (VITAMIN D PO) Take 2,000 Units by mouth daily.       . Multiple Vitamin (MULTIVITAMIN PO) Take 2 tablets by mouth daily.        . pravastatin (PRAVACHOL) 40 MG tablet Take 40 mg by mouth daily.       No current facility-administered medications on file prior to visit.   Allergies  Allergen Reactions  . Fish Oil Diarrhea     Current Problems (verified) Patient Active Problem List   Diagnosis Date Noted  . Unspecified essential hypertension 11/07/2013  . Mixed hyperlipidemia 11/07/2013  . Other abnormal glucose 11/07/2013  . Obstructive chronic bronchitis without exacerbation 11/07/2013  . Unspecified vitamin D deficiency 11/07/2013  . Diverticulosis of colon (without mention of hemorrhage) 09/10/2011  . Colon polyp 09/10/2011  . Special screening for malignant neoplasms, colon 09/10/2011  . Personal history of colonic polyps 09/10/2011    Screening Tests Health Maintenance  Topic Date Due  . Mammogram  04/28/2014  . Influenza Vaccine  07/29/2014  . Colonoscopy  09/09/2014  . Tetanus/tdap  02/27/2019  . Pneumococcal Polysaccharide Vaccine Age 46 And Over  Completed  . Zostavax  Completed     Immunization History  Administered Date(s) Administered  . Influenza, High Dose Seasonal PF 08/29/2013  . Pneumococcal Polysaccharide-23 03/29/2010  . Tdap 02/26/2009  . Zoster 12/29/2008    Preventative care: Last colonoscopy: 09/10/11 Last mammogram: 10/13/13 Last pap smear/pelvic exam:> 5 years Patient declines  DEXA:10/13/13 Osteoporosis CXR:10/14/14COPD  Prior vaccinations: TD or Tdap: 02/2009  Influenza:08/2013 Pneumococcal: 03/2010 Shingles/Zostavax: 2010  History reviewed:  Past Medical History  Diagnosis Date  . Hyperlipidemia   . Osteoporosis   . Emphysema of lung   . Hypertension   . GERD (gastroesophageal reflux disease)   . Pre-diabetes   . Kidney stones   . Colon polyps   . Anxiety   . Insomnia    Past Surgical History  Procedure  Laterality Date  . Hysterectomy    . Colonoscopy    . Polypectomy     History  Substance Use Topics  . Smoking status: Current Every Day Smoker -- 0.50 packs/day for 32 years  . Smokeless tobacco: Not on file  . Alcohol Use: No   Family History  Problem Relation Age of Onset  . Heart disease Mother   . Breast cancer Mother   . Heart disease Father   . Diabetes Father   . Colon cancer Neg Hx   . Esophageal cancer Neg Hx   . Stomach cancer Neg Hx   . Cancer Brother     Lung  . Bipolar disorder Daughter     Risk Factors: Osteoporosis: postmenopausal estrogen deficiency History of fracture in the past year: no  Tobacco History  Substance Use Topics  . Smoking status: Current Every Day Smoker -- 0.50 packs/day for 32 years  . Smokeless tobacco: Not on file  . Alcohol Use: No   She does smoke.  Patient is not a former smoker. Are there smokers in your home (other than you)?  No  Alcohol Current alcohol use: none  Caffeine Current caffeine use: coffee 10 /day  Exercise Exercise limitations: The patient has no exercise limitations. Current exercise: housecleaning, walking and yard work  Nutrition/Diet Current diet: in general, a "healthy" diet    Cardiac risk factors: advanced age (older than 62 for men, 94 for women) and dyslipidemia.  Depression Screen Nurse depression screen reviewed.  (Note: if answer to either of the following is "Yes", a more complete depression screening is indicated)   Q1: Over the past two weeks, have you felt down, depressed or hopeless? No  Q2: Over the past two weeks, have you felt little interest or pleasure in doing things? No  Have you lost interest or pleasure in daily life? No  Do you often feel hopeless? No  Do you cry easily over simple problems? No  Activities of Daily Living Nurse ADLs screen reviewed.  In your present state of health, do you have any difficulty performing the following activities?:  Driving?  No Managing money?  No Feeding yourself? No Getting from bed to chair? No Climbing a flight of stairs? No Preparing food and eating?: No Bathing or showering? No Getting dressed: No Getting to the toilet? No Using the toilet:No Moving around from place to place: No In the past year have you fallen or had a near fall?:No   Are you sexually active?  No  Do you have more than one partner?  No  Vision Difficulties: No  Hearing Difficulties:Mild loss screen at Dr Wynelle Link 09/27/13 Do you often ask people to speak up or repeat themselves? No Do you experience ringing or noises in your ears? No Do you have difficulty understanding soft or whispered voices? No  Cognition  Do you feel that you have a problem with memory?No  Do you often misplace items? No  Do you feel safe at home?  Yes  Advanced directives Does  patient have a Health Care Power of Attorney? Yes, Clydia Llano- daughter Does patient have a Living Will? Yes    Objective:     Vision and hearing screens reviewed.   Blood pressure 126/64, pulse 72, temperature 98 F (36.7 C), temperature source Temporal, resp. rate 16, height 5' 3.5" (1.613 m), weight 140 lb (63.504 kg). Body mass index is 24.41 kg/(m^2).  General appearance: alert, no distress, WD/WN,  female Cognitive Testing  Alert? Yes  Normal Appearance?Yes  Oriented to person? Yes  Place? Yes   Time? Yes  Recall of three objects?  Yes  Can perform simple calculations? Yes  Displays appropriate judgment?Yes  Can read the correct time from a watch face?Yes  HEENT: normocephalic, sclerae anicteric, TMs pearly, nares patent, no discharge or erythema, pharynx normal Oral cavity: MMM, no lesions Neck: supple, no lymphadenopathy, no thyromegaly, no masses Heart: RRR, normal S1, S2, no murmurs Lungs: CTA bilaterally, no wheezes, rhonchi, or rales Abdomen: +bs, soft, non tender, non distended, no masses, no hepatomegaly, no splenomegaly Musculoskeletal:  nontender, no swelling, no obvious deformity Extremities: no edema, no cyanosis, no clubbing Pulses: 2+ symmetric, upper and lower extremities, normal cap refill Skin: Palms of hands with mild erythema/scaling. Neurological: alert, oriented x 3, CN2-12 intact, strength normal upper extremities and lower extremities, sensation normal throughout, DTRs 2+ throughout, no cerebellar signs, gait normal Psychiatric: normal affect, behavior normal, pleasant  Breast: Patient declines Gyn:  Patient declines  Rectal: Patient declines    Assessment:  1. Medicare wellness Update- Update screening labs/ History/ Immunizations/ Testing as needed. Advised healthy diet, QD exercise, increase H20 and continue RX/ Vitamins AD.  2.3 month F/U for Labile HTN, Cholesterol, Pre-Dm, D. Deficient. Needs healthy diet, cardio QD and obtain healthy weight. Check Labs, Check BP if >130/80 call office  3. Tobacco Dep- advised cessation techniques and need for d/c to decrease Risk  4. ? Psoriasis of hands- Keep Derm f/u, try adding Zyrtec and vaseline soaks at [facility]     Plan:   During the course of the visit the patient was educated and counseled about appropriate screening and preventive services including:    Diabetes screening  Nutrition counseling   Smoking cessation counseling  Screening recommendations, referrals: ALL FOLLOWING UP TO DATE OR DECLINES  Vaccinations: Tdap vaccine no  Influenza vaccine no Pneumococcal vaccine no Shingles vaccine no Hep B vaccine no  Nutrition assessed and recommended  Colonoscopy no Mammogram no Pap smear no Pelvic exam no Recommended yearly ophthalmology/optometry visit for glaucoma screening and checkup Recommended yearly dental visit for hygiene and checkup Advanced directives - no  Conditions/risks identified: BMI: Discussed weight loss, diet, and increase physical activity.  Increase physical activity: AHA recommends 150 minutes of physical activity a  week.  Medications reviewed DEXA- not indicated Diabetes is at goal, ACE/ARB therapy: No, Reason not on Ace Inhibitor/ARB therapy:  not a diabetic Urinary Incontinence is not an issue: discussed non pharmacology and pharmacology options.  Fall risk: low- discussed PT, home fall assessment, medications.   Medicare Attestation I have personally reviewed: The patient's medical and social history Their use of alcohol, tobacco or illicit drugs Their current medications and supplements The patient's functional ability including ADLs,fall risks, home safety risks, cognitive, and hearing and visual impairment Diet and physical activities Evidence for depression or mood disorders  The patient's weight, height, BMI, and visual acuity have been recorded in the chart.  I have made referrals, counseling, and provided education to the patient based on  review of the above and I have provided the patient with a written personalized care plan for preventive services.     Ardis Hughs, PA-C   04/10/2014    CPT H2094 first AWV CPT [phone] subsequent AWV

## 2014-04-05 NOTE — Patient Instructions (Signed)
We want weight loss that will last so you should lose 1-2 pounds a week.  THAT IS IT! Please pick THREE things a month to change. Once it is a habit check off the item. Then pick another three items off the list to become habits.  If you are already doing a habit on the list GREAT!  Cross that item off! o Don't drink your calories. Ie, alcohol, soda, fruit juice, and sweet tea.  o Drink more water. Drink a glass when you feel hungry or before each meal.  o Eat breakfast - Complex carb and protein (likeDannon light and fit yogurt, oatmeal, fruit, eggs, [place] bacon). o Measure your cereal.  Eat no more than one cup a day. (ie [place]) o Eat an apple a day. o Add a vegetable a day. o Try a new vegetable a month. o Use Pam! Stop using oil or butter to cook. o Don't finish your plate or use smaller plates. o Share your dessert. o Eat sugar free Jello for dessert or frozen grapes. o Don't eat 2-3 hours before bed. o Switch to whole wheat bread, pasta, and brown rice. o Make healthier choices when you eat out. No fries! o Pick baked chicken, NOT fried. o Don't forget to SLOW DOWN when you eat. It is not going anywhere.  o Take the stairs. o Park far away in the parking lot o [employer] (or weights) for 10 minutes while watching TV. o Walk at work for 10 minutes during break. o Walk outside 1 time a week with your friend, kids, dog, or significant other. o Start a walking group at [place] the mall as much as you can tolerate.  o Keep a food diary. o Weigh yourself daily. o Walk for 15 minutes 3 days per week. o Cook at home more often and eat out less.  If life happens and you go back to old habits, it is okay.  Just start over. You can do it!   If you experience chest pain, get short of breath, or tired during the exercise, please stop immediately and inform your doctor.  Fat and Cholesterol Control Diet Fat and cholesterol levels in your blood and organs are influenced by your  diet. High levels of fat and cholesterol may lead to diseases of the heart, small and large blood vessels, gallbladder, liver, and pancreas. CONTROLLING FAT AND CHOLESTEROL WITH DIET Although exercise and lifestyle factors are important, your diet is key. That is because certain foods are known to raise cholesterol and others to lower it. The goal is to balance foods for their effect on cholesterol and more importantly, to replace saturated and trans fat with other types of fat, such as monounsaturated fat, polyunsaturated fat, and omega-3 fatty acids. On average, a person should consume no more than 15 to 17 g of saturated fat daily. Saturated and trans fats are considered "bad" fats, and they will raise LDL cholesterol. Saturated fats are primarily found in animal products such as meats, butter, and cream. However, that does not mean you need to give up all your favorite foods. Today, there are good tasting, low-fat, low-cholesterol substitutes for most of the things you like to eat. Choose low-fat or nonfat alternatives. Choose round or loin cuts of red meat. These types of cuts are lowest in fat and cholesterol. Chicken (without the skin), fish, veal, and ground [place] breast are great choices. Eliminate fatty meats, such as hot dogs and salami. Even shellfish have  little or no saturated fat. Have a 3 oz (85 g) portion when you eat lean meat, poultry, or fish. Trans fats are also called "partially hydrogenated oils." They are oils that have been scientifically manipulated so that they are solid at room temperature resulting in a longer shelf life and improved taste and texture of foods in which they are added. Trans fats are found in stick margarine, some tub margarines, cookies, crackers, and baked goods.  When baking and cooking, oils are a great substitute for butter. The monounsaturated oils are especially beneficial since it is believed they lower LDL and raise HDL. The oils you should avoid entirely  are saturated tropical oils, such as coconut and palm.  Remember to eat a lot from food groups that are naturally free of saturated and trans fat, including fish, fruit, vegetables, beans, grains (barley, rice, couscous, bulgur wheat), and pasta (without cream sauces).  IDENTIFYING FOODS THAT LOWER FAT AND CHOLESTEROL  Soluble fiber may lower your cholesterol. This type of fiber is found in fruits such as apples, vegetables such as broccoli, potatoes, and carrots, legumes such as beans, peas, and lentils, and grains such as barley. Foods fortified with plant sterols (phytosterol) may also lower cholesterol. You should eat at least 2 g per day of these foods for a cholesterol lowering effect.  Read package labels to identify low-saturated fats, trans fat free, and low-fat foods at the supermarket. Select cheeses that have only 2 to 3 g saturated fat per ounce. Use a heart-healthy tub margarine that is free of trans fats or partially hydrogenated oil. When buying baked goods (cookies, crackers), avoid partially hydrogenated oils. Breads and muffins should be made from whole grains (whole-wheat or whole oat flour, instead of "flour" or "enriched flour"). Buy non-creamy canned soups with reduced salt and no added fats.  FOOD PREPARATION TECHNIQUES  Never deep-fry. If you must fry, either stir-fry, which uses very little fat, or use non-stick cooking sprays. When possible, broil, bake, or roast meats, and steam vegetables. Instead of putting butter or margarine on vegetables, use lemon and herbs, applesauce, and cinnamon (for squash and sweet potatoes). Use nonfat yogurt, salsa, and low-fat dressings for salads.  LOW-SATURATED FAT / LOW-FAT FOOD SUBSTITUTES Meats / Saturated Fat (g)  Avoid: Steak, marbled (3 oz/85 g) / 11 g  Choose: Steak, lean (3 oz/85 g) / 4 g  Avoid: Hamburger (3 oz/85 g) / 7 g  Choose: Hamburger, lean (3 oz/85 g) / 5 g  Avoid: Ham (3 oz/85 g) / 6 g  Choose: Ham, lean cut (3 oz/85  g) / 2.4 g  Avoid: Chicken, with skin, dark meat (3 oz/85 g) / 4 g  Choose: Chicken, skin removed, dark meat (3 oz/85 g) / 2 g  Avoid: Chicken, with skin, light meat (3 oz/85 g) / 2.5 g  Choose: Chicken, skin removed, light meat (3 oz/85 g) / 1 g Dairy / Saturated Fat (g)  Avoid: Whole milk (1 cup) / 5 g  Choose: Low-fat milk, 2% (1 cup) / 3 g  Choose: Low-fat milk, 1% (1 cup) / 1.5 g  Choose: Skim milk (1 cup) / 0.3 g  Avoid: Hard cheese (1 oz/28 g) / 6 g  Choose: Skim milk cheese (1 oz/28 g) / 2 to 3 g  Avoid: Cottage cheese, 4% fat (1 cup) / 6.5 g  Choose: Low-fat cottage cheese, 1% fat (1 cup) / 1.5 g  Avoid: Ice cream (1 cup) / 9 g  Choose: Sherbet (  1 cup) / 2.5 g  Choose: Nonfat frozen yogurt (1 cup) / 0.3 g  Choose: Frozen fruit bar / trace  Avoid: Whipped cream (1 tbs) / 3.5 g  Choose: Nondairy whipped topping (1 tbs) / 1 g Condiments / Saturated Fat (g)  Avoid: Mayonnaise (1 tbs) / 2 g  Choose: Low-fat mayonnaise (1 tbs) / 1 g  Avoid: Butter (1 tbs) / 7 g  Choose: Extra light margarine (1 tbs) / 1 g  Avoid: Coconut oil (1 tbs) / 11.8 g  Choose: Olive oil (1 tbs) / 1.8 g  Choose: Corn oil (1 tbs) / 1.7 g  Choose: Safflower oil (1 tbs) / 1.2 g  Choose: Sunflower oil (1 tbs) / 1.4 g  Choose: Soybean oil (1 tbs) / 2.4 g  Choose: Canola oil (1 tbs) / 1 g Document Released: 12/15/2005 Document Revised: 04/11/2013 Document Reviewed: 06/05/2011 [employer] Patient Information 2014 [place], [place]. Smoking Cessation, Tips for Success If you are ready to quit smoking, congratulations! You have chosen to help yourself be healthier. Cigarettes bring nicotine, tar, carbon monoxide, and other irritants into your body. Your lungs, heart, and blood vessels will be able to work better without these poisons. There are many different ways to quit smoking. Nicotine gum, nicotine patches, a nicotine inhaler, or nicotine nasal spray can help with physical craving.  Hypnosis, support groups, and medicines help break the habit of smoking. WHAT THINGS CAN I DO TO MAKE QUITTING EASIER?  Here are some tips to help you quit for good:  Pick a date when you will quit smoking completely. Tell all of your friends and family about your plan to quit on that date.  Do not try to slowly cut down on the number of cigarettes you are smoking. Pick a quit date and quit smoking completely starting on that day.  Throw away all cigarettes.   Clean and remove all ashtrays from your home, work, and car.   On a card, write down your reasons for quitting. Carry the card with you and read it when you get the urge to smoke.   Cleanse your body of nicotine. Drink enough water and fluids to keep your urine clear or pale yellow. Do this after quitting to flush the nicotine from your body.   Learn to predict your moods. Do not let a bad situation be your excuse to have a cigarette. Some situations in your life might tempt you into wanting a cigarette.   Never have "just one" cigarette. It leads to wanting another and another. Remind yourself of your decision to quit.   Change habits associated with smoking. If you smoked while driving or when feeling stressed, try other activities to replace smoking. Stand up when drinking your coffee. Brush your teeth after eating. Sit in a different chair when you read the paper. Avoid alcohol while trying to quit, and try to drink fewer caffeinated beverages. Alcohol and caffeine may urge you to smoke.   Avoid foods and drinks that can trigger a desire to smoke, such as sugary or spicy foods and alcohol.   Ask people who smoke not to smoke around you.   Have something planned to do right after eating or having a cup of coffee. For example, plan to take a walk or exercise.   Try a relaxation exercise to calm you down and decrease your stress. Remember, you may be tense and nervous for the first 2 weeks after you quit, but this will  pass.  Find new activities to keep your hands busy. Play with a pen, coin, or rubber band. Doodle or draw things on paper.   Brush your teeth right after eating. This will help cut down on the craving for the taste of tobacco after meals. You can also try mouthwash.   Use oral substitutes in place of cigarettes. Try using lemon drops, carrots, cinnamon sticks, or chewing gum. Keep them handy so they are available when you have the urge to smoke.   When you have the urge to smoke, try deep breathing.   Designate your home as a nonsmoking area.   If you are a heavy smoker, ask your health care provider about a prescription for nicotine chewing gum. It can ease your withdrawal from nicotine.   Reward yourself. Set aside the cigarette money you save and buy yourself something nice.   Look for support from others. Join a support group or smoking cessation program. Ask someone at home or at work to help you with your plan to quit smoking.   Always ask yourself, "Do I need this cigarette or is this just a reflex?" Tell yourself, "Today, I choose not to smoke," or "I do not want to smoke." You are reminding yourself of your decision to quit.  Do not replace cigarette smoking with electronic cigarettes (commonly called e-cigarettes). The safety of e-cigarettes is unknown, and some may contain harmful chemicals.  If you relapse, do not give up! Plan ahead and think about what you will do the next time you get the urge to smoke.  HOW WILL I FEEL WHEN I QUIT SMOKING? You may have symptoms of withdrawal because your body is used to nicotine (the addictive substance in cigarettes). You may crave cigarettes, be irritable, feel very hungry, cough often, get headaches, or have difficulty concentrating. The withdrawal symptoms are only temporary. They are strongest when you first quit but will go away within 10 14 days. When withdrawal symptoms occur, stay in control. Think about your reasons for  quitting. Remind yourself that these are signs that your body is healing and getting used to being without cigarettes. Remember that withdrawal symptoms are easier to treat than the major diseases that smoking can cause.  Even after the withdrawal is over, expect periodic urges to smoke. However, these cravings are generally short lived and will go away whether you smoke or not. Do not smoke!  WHAT RESOURCES ARE AVAILABLE TO HELP ME QUIT SMOKING? Your health care provider can direct you to community resources or hospitals for support, which may include:  Group support.  Education.  Hypnosis.  Therapy. Document Released: 09/12/2004 Document Revised: 10/05/2013 Document Reviewed: 06/02/2013 [facility] Patient Information 2014 [place], [place].

## 2014-04-06 LAB — BASIC METABOLIC PANEL WITH GFR
BUN: 19 mg/dL (ref 6–23)
CALCIUM: 9.4 mg/dL (ref 8.4–10.5)
CO2: 27 mEq/L (ref 19–32)
CREATININE: 0.94 mg/dL (ref 0.50–1.10)
Chloride: 108 mEq/L (ref 96–112)
GFR, Est African American: 72 mL/min
GFR, Est Non African American: 62 mL/min
GLUCOSE: 79 mg/dL (ref 70–99)
Potassium: 4.2 mEq/L (ref 3.5–5.3)
Sodium: 142 mEq/L (ref 135–145)

## 2014-04-06 LAB — MAGNESIUM: Magnesium: 2 mg/dL (ref 1.5–2.5)

## 2014-04-06 LAB — HEPATIC FUNCTION PANEL
ALBUMIN: 4.4 g/dL (ref 3.5–5.2)
ALT: 14 U/L (ref 0–35)
AST: 14 U/L (ref 0–37)
Alkaline Phosphatase: 47 U/L (ref 39–117)
Bilirubin, Direct: 0.1 mg/dL (ref 0.0–0.3)
Indirect Bilirubin: 0.2 mg/dL (ref 0.2–1.2)
TOTAL PROTEIN: 6.6 g/dL (ref 6.0–8.3)
Total Bilirubin: 0.3 mg/dL (ref 0.2–1.2)

## 2014-04-06 LAB — LIPID PANEL
CHOL/HDL RATIO: 3.6 ratio
Cholesterol: 170 mg/dL (ref 0–200)
HDL: 47 mg/dL (ref >39)
LDL Cholesterol: 89 mg/dL (ref 0–99)
Triglycerides: 168 mg/dL — ABNORMAL HIGH (ref <150)
VLDL: 34 mg/dL (ref 0–40)

## 2014-04-06 LAB — INSULIN, FASTING: Insulin fasting, serum: 9 u[IU]/mL (ref 3–28)

## 2014-06-01 ENCOUNTER — Other Ambulatory Visit: Payer: Self-pay | Admitting: Internal Medicine

## 2014-07-05 ENCOUNTER — Ambulatory Visit: Payer: Self-pay | Admitting: Physician Assistant

## 2014-07-25 DIAGNOSIS — L301 Dyshidrosis [pompholyx]: Secondary | ICD-10-CM | POA: Diagnosis not present

## 2014-07-26 DIAGNOSIS — Z79899 Other long term (current) drug therapy: Secondary | ICD-10-CM | POA: Diagnosis not present

## 2014-07-28 ENCOUNTER — Encounter: Payer: Self-pay | Admitting: Gastroenterology

## 2014-08-25 DIAGNOSIS — H2510 Age-related nuclear cataract, unspecified eye: Secondary | ICD-10-CM | POA: Diagnosis not present

## 2014-09-28 ENCOUNTER — Encounter: Payer: Self-pay | Admitting: Emergency Medicine

## 2014-10-26 ENCOUNTER — Encounter: Payer: Self-pay | Admitting: Internal Medicine

## 2014-12-12 ENCOUNTER — Ambulatory Visit: Payer: Self-pay

## 2014-12-26 DIAGNOSIS — K130 Diseases of lips: Secondary | ICD-10-CM | POA: Diagnosis not present

## 2015-01-05 DIAGNOSIS — L309 Dermatitis, unspecified: Secondary | ICD-10-CM | POA: Diagnosis not present

## 2015-02-02 ENCOUNTER — Ambulatory Visit (INDEPENDENT_AMBULATORY_CARE_PROVIDER_SITE_OTHER): Payer: Medicare Other | Admitting: Internal Medicine

## 2015-02-02 ENCOUNTER — Encounter: Payer: Self-pay | Admitting: Internal Medicine

## 2015-02-02 VITALS — BP 132/84 | HR 76 | Temp 99.1°F | Resp 16 | Ht 64.0 in | Wt 142.6 lb

## 2015-02-02 DIAGNOSIS — R7309 Other abnormal glucose: Secondary | ICD-10-CM | POA: Diagnosis not present

## 2015-02-02 DIAGNOSIS — E559 Vitamin D deficiency, unspecified: Secondary | ICD-10-CM

## 2015-02-02 DIAGNOSIS — E782 Mixed hyperlipidemia: Secondary | ICD-10-CM | POA: Diagnosis not present

## 2015-02-02 DIAGNOSIS — Z79899 Other long term (current) drug therapy: Secondary | ICD-10-CM | POA: Diagnosis not present

## 2015-02-02 DIAGNOSIS — Z1331 Encounter for screening for depression: Secondary | ICD-10-CM

## 2015-02-02 DIAGNOSIS — I10 Essential (primary) hypertension: Secondary | ICD-10-CM

## 2015-02-02 DIAGNOSIS — J449 Chronic obstructive pulmonary disease, unspecified: Secondary | ICD-10-CM

## 2015-02-02 DIAGNOSIS — Z1212 Encounter for screening for malignant neoplasm of rectum: Secondary | ICD-10-CM

## 2015-02-02 DIAGNOSIS — Z23 Encounter for immunization: Secondary | ICD-10-CM

## 2015-02-02 DIAGNOSIS — J4489 Other specified chronic obstructive pulmonary disease: Secondary | ICD-10-CM

## 2015-02-02 DIAGNOSIS — Z9181 History of falling: Secondary | ICD-10-CM

## 2015-02-02 LAB — BASIC METABOLIC PANEL WITH GFR
BUN: 23 mg/dL (ref 6–23)
CHLORIDE: 104 meq/L (ref 96–112)
CO2: 28 mEq/L (ref 19–32)
Calcium: 9.5 mg/dL (ref 8.4–10.5)
Creat: 1.06 mg/dL (ref 0.50–1.10)
GFR, EST NON AFRICAN AMERICAN: 53 mL/min — AB
GFR, Est African American: 61 mL/min
Glucose, Bld: 79 mg/dL (ref 70–99)
POTASSIUM: 4 meq/L (ref 3.5–5.3)
Sodium: 141 mEq/L (ref 135–145)

## 2015-02-02 LAB — HEPATIC FUNCTION PANEL
ALK PHOS: 50 U/L (ref 39–117)
ALT: 21 U/L (ref 0–35)
AST: 13 U/L (ref 0–37)
Albumin: 4.1 g/dL (ref 3.5–5.2)
BILIRUBIN INDIRECT: 0.3 mg/dL (ref 0.2–1.2)
Bilirubin, Direct: 0.1 mg/dL (ref 0.0–0.3)
TOTAL PROTEIN: 6.6 g/dL (ref 6.0–8.3)
Total Bilirubin: 0.4 mg/dL (ref 0.2–1.2)

## 2015-02-02 LAB — HEMOGLOBIN A1C
HEMOGLOBIN A1C: 5.6 % (ref <5.7)
Mean Plasma Glucose: 114 mg/dL (ref <117)

## 2015-02-02 LAB — CBC WITH DIFFERENTIAL/PLATELET
BASOS ABS: 0 10*3/uL (ref 0.0–0.1)
Basophils Relative: 0 % (ref 0–1)
Eosinophils Absolute: 0.3 10*3/uL (ref 0.0–0.7)
Eosinophils Relative: 2 % (ref 0–5)
HCT: 42.8 % (ref 36.0–46.0)
Hemoglobin: 14.2 g/dL (ref 12.0–15.0)
Lymphocytes Relative: 18 % (ref 12–46)
Lymphs Abs: 2.3 10*3/uL (ref 0.7–4.0)
MCH: 30.1 pg (ref 26.0–34.0)
MCHC: 33.2 g/dL (ref 30.0–36.0)
MCV: 90.7 fL (ref 78.0–100.0)
MPV: 10.9 fL (ref 8.6–12.4)
Monocytes Absolute: 0.8 10*3/uL (ref 0.1–1.0)
Monocytes Relative: 6 % (ref 3–12)
NEUTROS ABS: 9.5 10*3/uL — AB (ref 1.7–7.7)
Neutrophils Relative %: 74 % (ref 43–77)
Platelets: 294 10*3/uL (ref 150–400)
RBC: 4.72 MIL/uL (ref 3.87–5.11)
RDW: 13.2 % (ref 11.5–15.5)
WBC: 12.8 10*3/uL — ABNORMAL HIGH (ref 4.0–10.5)

## 2015-02-02 LAB — LIPID PANEL
Cholesterol: 180 mg/dL (ref 0–200)
HDL: 52 mg/dL (ref >39)
LDL CALC: 83 mg/dL (ref 0–99)
Total CHOL/HDL Ratio: 3.5 Ratio
Triglycerides: 224 mg/dL — ABNORMAL HIGH (ref <150)
VLDL: 45 mg/dL — ABNORMAL HIGH (ref 0–40)

## 2015-02-02 LAB — MAGNESIUM: Magnesium: 2.1 mg/dL (ref 1.5–2.5)

## 2015-02-02 NOTE — Progress Notes (Signed)
Patient ID: Alison Campbell, female   DOB: Oct 20, 1946, 69 y.o.   MRN: [id number]  Annual Comprehensive Examination  This very nice 69 y.o. [facility] presents for complete physical.  Patient has been followed for HTN, Prediabetes, Hyperlipidemia, and Vitamin D Deficiency.    Patient has hx/o labile HTN for years and has been monitored expectantly.  Patient's BP has been controlled and today's BP was  132/84 mmHg. Patient denies any cardiac symptoms as chest pain, palpitations, shortness of breath, dizziness or ankle swelling.    Patient's hyperlipidemia is controlled with diet and medications. Patient denies myalgias or other medication SE's. Last lipids were at goal - Total  Chol170; HDL 47; LDL 89; Trig 168 on 04/05/2014.   Patient has  prediabetes predating since Apr 2011 with an A1 6.0% and patient denies reactive hypoglycemic symptoms, visual blurring, diabetic polys, or paresthesias. Last A1c was 5.6% on 04/05/2014.   Finally, patient has history of Vitamin D Deficiency and last Vitamin D was  48 on 02/02/2015.  Medication Sig  . Alendronate /  FOSAMAX  Take 1 tablet by mouth once a week.    . ALPRAZolam  1 MG tablet 1/2 to 1 tablet 2 to 3 x daily as needed for anxiety or sleep  . CALCIUM  Take 2 tablets by mouth daily. 600 mg  2 per day  . VITAMIN D Take 2,000 Units by mouth daily.   . Multiple Vitamin  Take 2 tablets by mouth daily.    . Pravastatin 40 MG tablet Take 40 mg by mouth daily.   Allergies  Allergen Reactions  . Fish Oil Diarrhea   Past Medical History  Diagnosis Date  . Hyperlipidemia   . Osteoporosis   . Emphysema of lung   . Hypertension   . GERD (gastroesophageal reflux disease)   . Pre-diabetes   . Kidney stones   . Colon polyps   . Anxiety   . Insomnia    Health Maintenance  Topic Date Due  . MAMMOGRAM  04/28/2014  . INFLUENZA VACCINE  07/29/2014  . COLONOSCOPY  09/09/2014  . TETANUS/TDAP  02/27/2019  . DEXA SCAN  Completed  . PNEUMOCOCCAL POLYSACCHARIDE VACCINE  AGE 34 AND OVER  Completed  . ZOSTAVAX  Completed   Immunization History  Administered Date(s) Administered  . Influenza, High Dose Seasonal PF 08/29/2013  . Pneumococcal Conjugate-13 02/02/2015  . Pneumococcal Polysaccharide-23 03/29/2010  . Tdap 02/26/2009  . Zoster 12/29/2008   Past Surgical History  Procedure Laterality Date  . Hysterectomy    . Colonoscopy    . Polypectomy     Family History  Problem Relation Age of Onset  . Heart disease Mother   . Breast cancer Mother   . Heart disease Father   . Diabetes Father   . Colon cancer Neg Hx   . Esophageal cancer Neg Hx   . Stomach cancer Neg Hx   . Cancer Brother     Lung  . Bipolar disorder Daughter    History  Substance Use Topics  . Smoking status: Former Smoker -- 0.50 packs/day for 32 years    Quit date: 08/29/2014  . Smokeless tobacco: No  . Alcohol Use: No    ROS Constitutional: Denies fever, chills, weight loss/gain, headaches, insomnia, fatigue, night sweats, and change in appetite. Eyes: Denies redness, blurred vision, diplopia, discharge, itchy, watery eyes.  ENT: Denies discharge, congestion, post nasal drip, epistaxis, sore throat, earache, hearing loss, dental pain, Tinnitus, Vertigo, Sinus pain, snoring.  Cardio:  Denies chest pain, palpitations, irregular heartbeat, syncope, dyspnea, diaphoresis, orthopnea, PND, claudication, edema Respiratory: denies cough, dyspnea, DOE, pleurisy, hoarseness, laryngitis, wheezing.  Gastrointestinal: Denies dysphagia, heartburn, reflux, water brash, pain, cramps, nausea, vomiting, bloating, diarrhea, constipation, hematemesis, melena, hematochezia, jaundice, hemorrhoids Genitourinary: Denies dysuria, frequency, urgency, nocturia, hesitancy, discharge, hematuria, flank pain Breast: Breast lumps, nipple discharge, bleeding.  Musculoskeletal: Denies arthralgia, myalgia, stiffness, Jt. Swelling, pain, limp, and strain/sprain. Denies falls. Skin: Denies puritis, rash, hives,  warts, acne, eczema, changing in skin lesion Neuro: No weakness, tremor, incoordination, spasms, paresthesia, pain Psychiatric: Denies confusion, memory loss, sensory loss. Denies Depression. Endocrine: Denies change in weight, skin, hair change, nocturia, and paresthesia, diabetic polys, visual blurring, hyper / hypo glycemic episodes.  Heme/Lymph: No excessive bleeding, bruising, enlarged lymph nodes.  Physical Exam  BP   132/84   P   76  T   99.1 F   R   16  Ht   5\' 4"    Wt   142 lb 9.6 oz     BMI 24.47    General Appearance: Well nourished and in no apparent distress. Eyes: PERRLA, EOMs, conjunctiva no swelling or erythema, normal fundi and vessels. Sinuses: No frontal/maxillary tenderness ENT/Mouth: EACs patent / TMs  nl. Nares clear without erythema, swelling, mucoid exudates. Oral hygiene is good. No erythema, swelling, or exudate. Tongue normal, non-obstructing. Tonsils not swollen or erythematous. Hearing normal.  Neck: Supple, thyroid normal. No bruits, nodes or JVD. Respiratory: Respiratory effort normal.  BS equal and clear bilateral without rales, rhonci, wheezing or stridor. Cardio: Heart sounds are normal with regular rate and rhythm and no murmurs, rubs or gallops. Peripheral pulses are normal and equal bilaterally without edema. No aortic or femoral bruits. Chest: symmetric with normal excursions and percussion. Breasts: Symmetric, without lumps, nipple discharge, retractions, or fibrocystic changes.  Abdomen: Flat, soft, with bowl sounds. Nontender, no guarding, rebound, hernias, masses, or organomegaly.  Lymphatics: Non tender without lymphadenopathy.  Musculoskeletal: Full ROM all peripheral extremities, joint stability, 5/5 strength, and normal gait. Skin: Warm and dry without rashes, lesions, cyanosis, clubbing or  ecchymosis.  Neuro: Cranial nerves intact, reflexes equal bilaterally. Normal muscle tone, no cerebellar symptoms. Sensation intact.  Pysch: Awake and  oriented X 3, normal affect, Insight and Judgment appropriate.   Assessment and Plan  1. Labile Hypertension  - Microalbumin / creatinine urine ratio - EKG 12-Lead - [place], RETROPERITNL ABD,  LTD - TSH - Expectant monitoring  2. Mixed hyperlipidemia  - Lipid panel - Continue Pravastatin  3. Abnormal glucose  - Hemoglobin A1c - Insulin, fasting  4. Vitamin D deficiency  - Vit D  25 hydroxy   5. COPD  - Check CXR  6. Medication management  - Urine Microscopic - CBC with Differential/Platelet - BASIC METABOLIC PANEL WITH GFR - Hepatic function panel - Magnesium  7. Screening for rectal cancer  - POC Hemoccult Bld/Stl   8. Depression screen  - Screen Negative  9. At low risk for fall   10. Need for prophylactic vaccination against Streptococcus pneumoniae (pneumococcus)  - Pneumococcal conjugate vaccine 13-valent   Continue prudent diet as discussed, weight control, BP monitoring, regular exercise, and medications. Discussed med's effects and SE's. Screening labs and tests as requested with regular follow-up as recommended.

## 2015-02-02 NOTE — Patient Instructions (Signed)

## 2015-02-03 ENCOUNTER — Encounter: Payer: Self-pay | Admitting: Internal Medicine

## 2015-02-03 LAB — URINALYSIS, MICROSCOPIC ONLY
Bacteria, UA: NONE SEEN
Casts: NONE SEEN
Crystals: NONE SEEN
SQUAMOUS EPITHELIAL / LPF: NONE SEEN

## 2015-02-03 LAB — MICROALBUMIN / CREATININE URINE RATIO
CREATININE, URINE: 137.6 mg/dL
MICROALB/CREAT RATIO: 3.6 mg/g (ref 0.0–30.0)
Microalb, Ur: 0.5 mg/dL (ref <2.0)

## 2015-02-03 LAB — VITAMIN D 25 HYDROXY (VIT D DEFICIENCY, FRACTURES): VIT D 25 HYDROXY: 48 ng/mL (ref 30–100)

## 2015-02-03 LAB — TSH: TSH: 0.975 u[IU]/mL (ref 0.350–4.500)

## 2015-02-03 LAB — INSULIN, FASTING: Insulin fasting, serum: 5 u[IU]/mL (ref 2.0–19.6)

## 2015-02-05 ENCOUNTER — Other Ambulatory Visit: Payer: Self-pay | Admitting: Internal Medicine

## 2015-02-05 DIAGNOSIS — Z1231 Encounter for screening mammogram for malignant neoplasm of breast: Secondary | ICD-10-CM

## 2015-02-08 ENCOUNTER — Other Ambulatory Visit: Payer: Self-pay | Admitting: *Deleted

## 2015-02-08 DIAGNOSIS — F411 Generalized anxiety disorder: Secondary | ICD-10-CM

## 2015-02-08 MED ORDER — ALPRAZOLAM 1 MG PO TABS: ORAL_TABLET | ORAL | Status: DC

## 2015-02-08 MED ORDER — ALENDRONATE SODIUM 70 MG PO TABS: 70.0000 mg | ORAL_TABLET | ORAL | Status: DC

## 2015-02-08 MED ORDER — PRAVASTATIN SODIUM 40 MG PO TABS: 40.0000 mg | ORAL_TABLET | Freq: Every day | ORAL | Status: DC

## 2015-02-13 ENCOUNTER — Ambulatory Visit (HOSPITAL_COMMUNITY): Payer: Medicare Other

## 2015-02-14 ENCOUNTER — Ambulatory Visit (HOSPITAL_COMMUNITY)
Admission: RE | Admit: 2015-02-14 | Discharge: 2015-02-14 | Disposition: A | Discharge status: Home / Self Care | Payer: Medicare Other | Source: Ambulatory Visit | Attending: Internal Medicine | Admitting: Internal Medicine

## 2015-02-14 DIAGNOSIS — I10 Essential (primary) hypertension: Secondary | ICD-10-CM | POA: Diagnosis not present

## 2015-02-14 DIAGNOSIS — K219 Gastro-esophageal reflux disease without esophagitis: Secondary | ICD-10-CM | POA: Insufficient documentation

## 2015-02-14 DIAGNOSIS — J449 Chronic obstructive pulmonary disease, unspecified: Secondary | ICD-10-CM

## 2015-02-14 DIAGNOSIS — F17200 Nicotine dependence, unspecified, uncomplicated: Secondary | ICD-10-CM | POA: Diagnosis not present

## 2015-02-14 DIAGNOSIS — Z1231 Encounter for screening mammogram for malignant neoplasm of breast: Secondary | ICD-10-CM

## 2015-02-14 DIAGNOSIS — I700 Atherosclerosis of aorta: Secondary | ICD-10-CM | POA: Diagnosis not present

## 2015-02-14 DIAGNOSIS — J441 Chronic obstructive pulmonary disease with (acute) exacerbation: Secondary | ICD-10-CM | POA: Diagnosis not present

## 2015-02-14 DIAGNOSIS — J984 Other disorders of lung: Secondary | ICD-10-CM | POA: Diagnosis not present

## 2015-02-14 DIAGNOSIS — F17210 Nicotine dependence, cigarettes, uncomplicated: Secondary | ICD-10-CM | POA: Insufficient documentation

## 2015-02-15 ENCOUNTER — Other Ambulatory Visit: Payer: Self-pay | Admitting: *Deleted

## 2015-02-15 DIAGNOSIS — Z1212 Encounter for screening for malignant neoplasm of rectum: Secondary | ICD-10-CM

## 2015-02-15 LAB — POC HEMOCCULT BLD/STL (HOME/3-CARD/SCREEN)
Card #3 Fecal Occult Blood, POC: NEGATIVE
FECAL OCCULT BLD: NEGATIVE
Fecal Occult Blood, POC: NEGATIVE

## 2015-03-02 DIAGNOSIS — L0291 Cutaneous abscess, unspecified: Secondary | ICD-10-CM | POA: Diagnosis not present

## 2015-03-07 DIAGNOSIS — L232 Allergic contact dermatitis due to cosmetics: Secondary | ICD-10-CM | POA: Diagnosis not present

## 2015-03-26 ENCOUNTER — Encounter: Payer: Self-pay | Admitting: Gastroenterology

## 2015-05-29 ENCOUNTER — Ambulatory Visit: Payer: Self-pay | Admitting: Physician Assistant

## 2015-06-21 ENCOUNTER — Encounter: Payer: Self-pay | Admitting: Physician Assistant

## 2015-06-21 ENCOUNTER — Ambulatory Visit (INDEPENDENT_AMBULATORY_CARE_PROVIDER_SITE_OTHER): Payer: Medicare Other | Admitting: Physician Assistant

## 2015-06-21 VITALS — BP 142/78 | HR 84 | Temp 97.7°F | Resp 16 | Ht 63.5 in | Wt 146.0 lb

## 2015-06-21 DIAGNOSIS — R002 Palpitations: Secondary | ICD-10-CM | POA: Diagnosis not present

## 2015-06-21 DIAGNOSIS — Z0001 Encounter for general adult medical examination with abnormal findings: Secondary | ICD-10-CM

## 2015-06-21 DIAGNOSIS — Z6825 Body mass index (BMI) 25.0-25.9, adult: Secondary | ICD-10-CM | POA: Diagnosis not present

## 2015-06-21 DIAGNOSIS — E559 Vitamin D deficiency, unspecified: Secondary | ICD-10-CM

## 2015-06-21 DIAGNOSIS — Z79899 Other long term (current) drug therapy: Secondary | ICD-10-CM

## 2015-06-21 DIAGNOSIS — I10 Essential (primary) hypertension: Secondary | ICD-10-CM

## 2015-06-21 DIAGNOSIS — E782 Mixed hyperlipidemia: Secondary | ICD-10-CM

## 2015-06-21 DIAGNOSIS — Z1331 Encounter for screening for depression: Secondary | ICD-10-CM

## 2015-06-21 DIAGNOSIS — Z9181 History of falling: Secondary | ICD-10-CM

## 2015-06-21 DIAGNOSIS — F17200 Nicotine dependence, unspecified, uncomplicated: Secondary | ICD-10-CM

## 2015-06-21 DIAGNOSIS — R7309 Other abnormal glucose: Secondary | ICD-10-CM | POA: Diagnosis not present

## 2015-06-21 DIAGNOSIS — R6889 Other general symptoms and signs: Secondary | ICD-10-CM

## 2015-06-21 DIAGNOSIS — Z0000 Encounter for general adult medical examination without abnormal findings: Secondary | ICD-10-CM

## 2015-06-21 DIAGNOSIS — J449 Chronic obstructive pulmonary disease, unspecified: Secondary | ICD-10-CM

## 2015-06-21 DIAGNOSIS — K5730 Diverticulosis of large intestine without perforation or abscess without bleeding: Secondary | ICD-10-CM

## 2015-06-21 DIAGNOSIS — M810 Age-related osteoporosis without current pathological fracture: Secondary | ICD-10-CM | POA: Insufficient documentation

## 2015-06-21 DIAGNOSIS — K635 Polyp of colon: Secondary | ICD-10-CM

## 2015-06-21 LAB — CBC WITH DIFFERENTIAL/PLATELET
Basophils Absolute: 0.1 10*3/uL (ref 0.0–0.1)
Basophils Relative: 1 % (ref 0–1)
Eosinophils Absolute: 0.2 10*3/uL (ref 0.0–0.7)
Eosinophils Relative: 2 % (ref 0–5)
HEMATOCRIT: 42.4 % (ref 36.0–46.0)
Hemoglobin: 14.2 g/dL (ref 12.0–15.0)
LYMPHS PCT: 27 % (ref 12–46)
Lymphs Abs: 2.1 10*3/uL (ref 0.7–4.0)
MCH: 30.4 pg (ref 26.0–34.0)
MCHC: 33.5 g/dL (ref 30.0–36.0)
MCV: 90.8 fL (ref 78.0–100.0)
MONO ABS: 0.5 10*3/uL (ref 0.1–1.0)
MPV: 10.4 fL (ref 8.6–12.4)
Monocytes Relative: 6 % (ref 3–12)
NEUTROS PCT: 64 % (ref 43–77)
Neutro Abs: 4.9 10*3/uL (ref 1.7–7.7)
Platelets: 348 10*3/uL (ref 150–400)
RBC: 4.67 MIL/uL (ref 3.87–5.11)
RDW: 13.2 % (ref 11.5–15.5)
WBC: 7.6 10*3/uL (ref 4.0–10.5)

## 2015-06-21 LAB — HEMOGLOBIN A1C
Hgb A1c MFr Bld: 5.7 % — ABNORMAL HIGH (ref <5.7)
MEAN PLASMA GLUCOSE: 117 mg/dL — AB (ref <117)

## 2015-06-21 NOTE — Patient Instructions (Addendum)
I think it is possible that you have sleep apnea. It can cause interrupted sleep, headaches, frequent awakenings, fatigue, dry mouth, fast/slow heart beats, memory issues, anxiety/depression, swelling, numbness tingling hands/feet, weight gain, shortness of breath, and the list goes on. Sleep apnea needs to be ruled out because if it is left untreated it does eventually lead to abnormal heart beats, lung failure or heart failure as well as increasing the risk of heart attack and stroke. There are masks you can wear OR a mouth piece that I can give you information about. Often times though people feel MUCH better after getting treatment.   Sleep Apnea  Sleep apnea is a sleep disorder characterized by abnormal pauses in breathing while you sleep. When your breathing pauses, the level of oxygen in your blood decreases. This causes you to move out of deep sleep and into light sleep. As a result, your quality of sleep is poor, and the system that carries your blood throughout your body (cardiovascular system) experiences stress. If sleep apnea remains untreated, the following conditions can develop:  High blood pressure (hypertension).  Coronary artery disease.  Inability to achieve or maintain an erection (impotence).  Impairment of your thought process (cognitive dysfunction). There are three types of sleep apnea: 1. Obstructive sleep apnea--Pauses in breathing during sleep because of a blocked airway. 2. Central sleep apnea--Pauses in breathing during sleep because the area of the brain that controls your breathing does not send the correct signals to the muscles that control breathing. 3. Mixed sleep apnea--A combination of both obstructive and central sleep apnea.  RISK FACTORS The following risk factors can increase your risk of developing sleep apnea:  Being overweight.  Smoking.  Having narrow passages in your nose and throat.  Being of older age.  Being female.  Alcohol use.   Sedative and tranquilizer use.  Ethnicity. Among individuals younger than 35 years, African Americans are at increased risk of sleep apnea. SYMPTOMS   Difficulty staying asleep.  Daytime sleepiness and fatigue.  Loss of energy.  Irritability.  Loud, heavy snoring.  Morning headaches.  Trouble concentrating.  Forgetfulness.  Decreased interest in sex. DIAGNOSIS  In order to diagnose sleep apnea, your caregiver will perform a physical examination. Your caregiver may suggest that you take a home sleep test. Your caregiver may also recommend that you spend the night in a sleep lab. In the sleep lab, several monitors record information about your heart, lungs, and brain while you sleep. Your leg and arm movements and blood oxygen level are also recorded. TREATMENT The following actions may help to resolve mild sleep apnea:  Sleeping on your side.   Using a decongestant if you have nasal congestion.   Avoiding the use of depressants, including alcohol, sedatives, and narcotics.   Losing weight and modifying your diet if you are overweight. There also are devices and treatments to help open your airway:  Oral appliances. These are custom-made mouthpieces that shift your lower jaw forward and slightly open your bite. This opens your airway.  Devices that create positive airway pressure. This positive pressure "splints" your airway open to help you breathe better during sleep. The following devices create positive airway pressure:  Continuous positive airway pressure (CPAP) device. The CPAP device creates a continuous level of air pressure with an air pump. The air is delivered to your airway through a mask while you sleep. This continuous pressure keeps your airway open.  Nasal expiratory positive airway pressure (EPAP) device. The EPAP device  creates positive air pressure as you exhale. The device consists of single-use valves, which are inserted into each nostril and held in  place by adhesive. The valves create very little resistance when you inhale but create much more resistance when you exhale. That increased resistance creates the positive airway pressure. This positive pressure while you exhale keeps your airway open, making it easier to breath when you inhale again.  Bilevel positive airway pressure (BPAP) device. The BPAP device is used mainly in patients with central sleep apnea. This device is similar to the CPAP device because it also uses an air pump to deliver continuous air pressure through a mask. However, with the BPAP machine, the pressure is set at two different levels. The pressure when you exhale is lower than the pressure when you inhale.  Surgery. Typically, surgery is only done if you cannot comply with less invasive treatments or if the less invasive treatments do not improve your condition. Surgery involves removing excess tissue in your airway to create a wider passage way. Document Released: 12/05/2002 Document Revised: 04/11/2013 Document Reviewed: 04/22/2012 [facility] Patient Information 2015 [place], [place]. This information is not intended to replace advice given to you by your health care provider. Make sure you discuss any questions you have with your health care provider.  Palpitations A palpitation is the feeling that your heartbeat is irregular or is faster than normal. It may feel like your heart is fluttering or skipping a beat. Palpitations are usually not a serious problem. However, in some cases, you may need further medical evaluation. CAUSES  Palpitations can be caused by:  Smoking.  Caffeine or other stimulants, such as diet pills or energy drinks.  Alcohol.  Stress and anxiety.  Strenuous physical activity.  Fatigue.  Certain medicines.  Heart disease, especially if you have a history of irregular heart rhythms (arrhythmias), such as atrial fibrillation, atrial flutter, or supraventricular tachycardia.  An  improperly working pacemaker or defibrillator. DIAGNOSIS  To find the cause of your palpitations, your health care provider will take your medical history and perform a physical exam. Your health care provider may also have you take a test called an ambulatory electrocardiogram (ECG). An ECG records your heartbeat patterns over a 24-hour period. You may also have other tests, such as:  Transthoracic echocardiogram (TTE). During echocardiography, sound waves are used to evaluate how blood flows through your heart.  Transesophageal echocardiogram (TEE).  Cardiac monitoring. This allows your health care provider to monitor your heart rate and rhythm in real time.  Holter monitor. This is a portable device that records your heartbeat and can help diagnose heart arrhythmias. It allows your health care provider to track your heart activity for several days, if needed.  Stress tests by exercise or by giving medicine that makes the heart beat faster. TREATMENT  Treatment of palpitations depends on the cause of your symptoms and can vary greatly. Most cases of palpitations do not require any treatment other than time, relaxation, and monitoring your symptoms. Other causes, such as atrial fibrillation, atrial flutter, or supraventricular tachycardia, usually require further treatment. HOME CARE INSTRUCTIONS   Avoid:  Caffeinated coffee, tea, soft drinks, diet pills, and energy drinks.  Chocolate.  Alcohol.  Stop smoking if you smoke.  Reduce your stress and anxiety. Things that can help you relax include:  A method of controlling things in your body, such as your heartbeats, with your mind (biofeedback).  Yoga.  Meditation.  Physical activity such as swimming, jogging, or walking.  Get plenty of rest and sleep. SEEK MEDICAL CARE IF:   You continue to have a fast or irregular heartbeat beyond 24 hours.  Your palpitations occur more often. SEEK IMMEDIATE MEDICAL CARE IF:  You have  chest pain or shortness of breath.  You have a severe headache.  You feel dizzy or you faint. MAKE SURE YOU:  Understand these instructions.  Will watch your condition.  Will get help right away if you are not doing well or get worse. Document Released: 12/12/2000 Document Revised: 12/20/2013 Document Reviewed: 02/13/2012 [facility] Patient Information 2015 [place], [place]. This information is not intended to replace advice given to you by your health care provider. Make sure you discuss any questions you have with your health care provider.   Preventive Care for Adults A healthy lifestyle and preventive care can promote health and wellness. Preventive health guidelines for women include the following key practices.  A routine yearly physical is a good way to check with your health care provider about your health and preventive screening. It is a chance to share any concerns and updates on your health and to receive a thorough exam.  Visit your dentist for a routine exam and preventive care every 6 months. Brush your teeth twice a day and floss once a day. Good oral hygiene prevents tooth decay and gum disease.  The frequency of eye exams is based on your age, health, family medical history, use of contact lenses, and other factors. Follow your health care provider's recommendations for frequency of eye exams.  Eat a healthy diet. Foods like vegetables, fruits, whole grains, low-fat dairy products, and lean protein foods contain the nutrients you need without too many calories. Decrease your intake of foods high in solid fats, added sugars, and salt. Eat the right amount of calories for you.Get information about a proper diet from your health care provider, if necessary.  Regular physical exercise is one of the most important things you can do for your health. Most adults should get at least 150 minutes of moderate-intensity exercise (any activity that increases your heart rate and causes  you to sweat) each week. In addition, most adults need muscle-strengthening exercises on 2 or more days a week.  Maintain a healthy weight. The body mass index (BMI) is a screening tool to identify possible weight problems. It provides an estimate of body fat based on height and weight. Your health care provider can find your BMI and can help you achieve or maintain a healthy weight.For adults 20 years and older:  A BMI below 18.5 is considered underweight.  A BMI of 18.5 to 24.9 is normal.  A BMI of 25 to 29.9 is considered overweight.  A BMI of 30 and above is considered obese.  Maintain normal blood lipids and cholesterol levels by exercising and minimizing your intake of saturated fat. Eat a balanced diet with plenty of fruit and vegetables. If your lipid or cholesterol levels are high, you are over 50, or you are at high risk for heart disease, you may need your cholesterol levels checked more frequently.Ongoing high lipid and cholesterol levels should be treated with medicines if diet and exercise are not working.  If you smoke, find out from your health care provider how to quit. If you do not use tobacco, do not start.  Lung cancer screening is recommended for adults aged 37-80 years who are at high risk for developing lung cancer because of a history of smoking. A yearly low-dose CT scan  of the lungs is recommended for people who have at least a 30-pack-year history of smoking and are a current smoker or have quit within the past 15 years. A pack year of smoking is smoking an average of 1 pack of cigarettes a day for 1 year (for example: 1 pack a day for 30 years or 2 packs a day for 15 years). Yearly screening should continue until the smoker has stopped smoking for at least 15 years. Yearly screening should be stopped for people who develop a health problem that would prevent them from having lung cancer treatment.  Avoid use of street drugs. Do not share needles with anyone. Ask for  help if you need support or instructions about stopping the use of drugs.  High blood pressure causes heart disease and increases the risk of stroke.  Ongoing high blood pressure should be treated with medicines if weight loss and exercise do not work.  If you are 32-76 years old, ask your health care provider if you should take aspirin to prevent strokes.  Diabetes screening involves taking a blood sample to check your fasting blood sugar level. This should be done once every 3 years, after age 75, if you are within normal weight and without risk factors for diabetes. Testing should be considered at a younger age or be carried out more frequently if you are overweight and have at least 1 risk factor for diabetes.  Breast cancer screening is essential preventive care for women. You should practice "breast self-awareness." This means understanding the normal appearance and feel of your breasts and may include breast self-examination. Any changes detected, no matter how small, should be reported to a health care provider. Women in their 5s and 30s should have a clinical breast exam (CBE) by a health care provider as part of a regular health exam every 1 to 3 years. After age 75, women should have a CBE every year. Starting at age 39, women should consider having a mammogram (breast X-ray test) every year. Women who have a family history of breast cancer should talk to their health care provider about genetic screening. Women at a high risk of breast cancer should talk to their health care providers about having an MRI and a mammogram every year.  Breast cancer gene (BRCA)-related cancer risk assessment is recommended for women who have family members with BRCA-related cancers. BRCA-related cancers include breast, ovarian, tubal, and peritoneal cancers. Having family members with these cancers may be associated with an increased risk for harmful changes (mutations) in the breast cancer genes BRCA1 and BRCA2.  Results of the assessment will determine the need for genetic counseling and BRCA1 and BRCA2 testing.  Routine pelvic exams to screen for cancer are no longer recommended for nonpregnant women who are considered low risk for cancer of the pelvic organs (ovaries, uterus, and vagina) and who do not have symptoms. Ask your health care provider if a screening pelvic exam is right for you.  If you have had past treatment for cervical cancer or a condition that could lead to cancer, you need Pap tests and screening for cancer for at least 20 years after your treatment. If Pap tests have been discontinued, your risk factors (such as having a new sexual partner) need to be reassessed to determine if screening should be resumed. Some women have medical problems that increase the chance of getting cervical cancer. In these cases, your health care provider may recommend more frequent screening and Pap tests.  Colorectal cancer can be detected and often prevented. Most routine colorectal cancer screening begins at the age of 39 years and continues through age 59 years. However, your health care provider may recommend screening at an earlier age if you have risk factors for colon cancer. On a yearly basis, your health care provider may provide home test kits to check for hidden blood in the stool. Use of a small camera at the end of a tube, to directly examine the colon (sigmoidoscopy or colonoscopy), can detect the earliest forms of colorectal cancer. Talk to your health care provider about this at age 22, when routine screening begins. Direct exam of the colon should be repeated every 5-10 years through age 91 years, unless early forms of pre-cancerous polyps or small growths are found.  Osteoporosis is a disease in which the bones lose minerals and strength with aging. This can result in serious bone fractures or breaks. The risk of osteoporosis can be identified using a bone density scan. Women ages 58 years and  over and women at risk for fractures or osteoporosis should discuss screening with their health care providers. Ask your health care provider whether you should take a calcium supplement or vitamin D to reduce the rate of osteoporosis.  Menopause can be associated with physical symptoms and risks. Hormone replacement therapy is available to decrease symptoms and risks. You should talk to your health care provider about whether hormone replacement therapy is right for you.  Use sunscreen. Apply sunscreen liberally and repeatedly throughout the day. You should seek shade when your shadow is shorter than you. Protect yourself by wearing long sleeves, pants, a wide-brimmed hat, and sunglasses year round, whenever you are outdoors.  Once a month, do a whole body skin exam, using a mirror to look at the skin on your back. Tell your health care provider of new moles, moles that have irregular borders, moles that are larger than a pencil eraser, or moles that have changed in shape or color.  Stay current with required vaccines (immunizations).  Influenza vaccine. All adults should be immunized every year.  Tetanus, diphtheria, and acellular pertussis (Td, Tdap) vaccine. Pregnant women should receive 1 dose of Tdap vaccine during each pregnancy. The dose should be obtained regardless of the length of time since the last dose. Immunization is preferred during the 27th-36th week of gestation. An adult who has not previously received Tdap or who does not know her vaccine status should receive 1 dose of Tdap. This initial dose should be followed by tetanus and diphtheria toxoids (Td) booster doses every 10 years. Adults with an unknown or incomplete history of completing a 3-dose immunization series with Td-containing vaccines should begin or complete a primary immunization series including a Tdap dose. Adults should receive a Td booster every 10 years.    Zoster vaccine. One dose is recommended for adults aged  48 years or older unless certain conditions are present.    Pneumococcal 13-valent conjugate (PCV13) vaccine. When indicated, a person who is uncertain of her immunization history and has no record of immunization should receive the PCV13 vaccine. An adult aged 23 years or older who has certain medical conditions and has not been previously immunized should receive 1 dose of PCV13 vaccine. This PCV13 should be followed with a dose of pneumococcal polysaccharide (PPSV23) vaccine. The PPSV23 vaccine dose should be obtained at least 8 weeks after the dose of PCV13 vaccine. An adult aged 57 years or older who has certain medical conditions  and previously received 1 or more doses of PPSV23 vaccine should receive 1 dose of PCV13. The PCV13 vaccine dose should be obtained 1 or more years after the last PPSV23 vaccine dose.    Pneumococcal polysaccharide (PPSV23) vaccine. When PCV13 is also indicated, PCV13 should be obtained first. All adults aged 64 years and older should be immunized. An adult younger than age 36 years who has certain medical conditions should be immunized. Any person who resides in a nursing home or long-term care facility should be immunized. An adult smoker should be immunized. People with an immunocompromised condition and certain other conditions should receive both PCV13 and PPSV23 vaccines. People with human immunodeficiency virus (HIV) infection should be immunized as soon as possible after diagnosis. Immunization during chemotherapy or radiation therapy should be avoided. Routine use of PPSV23 vaccine is not recommended for American Indians, [place] Natives, or people younger than 65 years unless there are medical conditions that require PPSV23 vaccine. When indicated, people who have unknown immunization and have no record of immunization should receive PPSV23 vaccine. One-time revaccination 5 years after the first dose of PPSV23 is recommended for people aged 19-64 years who have  chronic kidney failure, nephrotic syndrome, asplenia, or immunocompromised conditions. People who received 1-2 doses of PPSV23 before age 29 years should receive another dose of PPSV23 vaccine at age 37 years or later if at least 5 years have passed since the previous dose. Doses of PPSV23 are not needed for people immunized with PPSV23 at or after age 73 years.   Preventive Services / Frequency  Ages 61 years and over  Blood pressure check.  Lipid and cholesterol check.  Lung cancer screening. / Every year if you are aged 32-80 years and have a 30-pack-year history of smoking and currently smoke or have quit within the past 15 years. Yearly screening is stopped once you have quit smoking for at least 15 years or develop a health problem that would prevent you from having lung cancer treatment.  Clinical breast exam.** / Every year after age 33 years.  BRCA-related cancer risk assessment.** / For women who have family members with a BRCA-related cancer (breast, ovarian, tubal, or peritoneal cancers).  Mammogram.** / Every year beginning at age 70 years and continuing for as long as you are in good health. Consult with your health care provider.  Pap test.** / Every 3 years starting at age 69 years through age 37 or 37 years with 3 consecutive normal Pap tests. Testing can be stopped between 65 and 70 years with 3 consecutive normal Pap tests and no abnormal Pap or HPV tests in the past 10 years.  Fecal occult blood test (FOBT) of stool. / Every year beginning at age 14 years and continuing until age 76 years. You may not need to do this test if you get a colonoscopy every 10 years.  Flexible sigmoidoscopy or colonoscopy.** / Every 5 years for a flexible sigmoidoscopy or every 10 years for a colonoscopy beginning at age 60 years and continuing until age 57 years.  Hepatitis C blood test.** / For all people born from [birth year] through 1967 and any individual with known risks for hepatitis  C.  Osteoporosis screening.** / A one-time screening for women ages 6 years and over and women at risk for fractures or osteoporosis.  Skin self-exam. / Monthly.  Influenza vaccine. / Every year.  Tetanus, diphtheria, and acellular pertussis (Tdap/Td) vaccine.** / 1 dose of Td every 10 years.  Zoster vaccine.** /  1 dose for adults aged 57 years or older.  Pneumococcal 13-valent conjugate (PCV13) vaccine.** / Consult your health care provider.  Pneumococcal polysaccharide (PPSV23) vaccine.** / 1 dose for all adults aged 19 years and older. Screening for abdominal aortic aneurysm (AAA)  by ultrasound is recommended for people who have history of high blood pressure or who are current or former smokers.

## 2015-06-21 NOTE — Progress Notes (Signed)
Patient ID: Alison Campbell, female   DOB: 06/24/1946, 69 y.o.   MRN: [id number]   Medicare wellness and 3 month follow up  Assessment:   1. Essential hypertension - continue medications, DASH diet, exercise and monitor at home. Call if greater than 130/80.  - CBC with Differential/Platelet - BASIC METABOLIC PANEL WITH GFR - Hepatic function panel - TSH  2. Mixed hyperlipidemia -continue medications, check lipids, decrease fatty foods, increase activity.  - Lipid panel  3. Abnormal glucose Discussed general issues about diabetes pathophysiology and management., Educational material distributed., Suggested low cholesterol diet., Encouraged aerobic exercise., Discussed foot care., Reminded to get yearly retinal exam. - Hemoglobin A1c - Insulin, fasting  4. Obstructive chronic bronchitis without exacerbation Advised to stop smoking, will get CXR next year, continue meds.   5. Vitamin D deficiency Continue supplement  6. Medication management - Magnesium  7. Diverticulosis of large intestine without hemorrhage Non tender, will monitor  8. Colon polyp Follow up 2017  9. At low risk for fall No falls  10. Depression screen negative  11. Routine general medical examination at a health care facility Up to date vaccines  12. Tobacco use disorder Smoking cessation-  instruction/counseling given, counseled patient on the dangers of tobacco use, advised patient to stop smoking, and reviewed strategies to maximize success, patient not ready to quit at this time.   13. Palpitations Likely due to viral gastroenteritis/dehydrations, check labs, check EKG with smoking history, also does have crowded mouth, discussed possible sleep apnea.  - EKG 12-Lead  14. Osteoporosis - DG Bone Density; Future   Plan:   During the course of the visit the patient was educated and counseled about appropriate screening and preventive services including:   Diabetes screening  Nutrition  counseling   Smoking cessation counseling  Conditions/risks identified: BMI: Discussed weight loss, diet, and increase physical activity.  Increase physical activity: AHA recommends 150 minutes of physical activity a week.  Medications reviewed DEXA- due this year Diabetes is at goal, ACE/ARB therapy: No, Reason not on Ace Inhibitor/ARB therapy:  not a diabetic Urinary Incontinence is not an issue: discussed non pharmacology and pharmacology options.  Fall risk: low- discussed PT, home fall assessment, medications.   Subjective:   Alison Campbell is a 69 y.o. female who presents for Medicare Annual Wellness Visit and 3 month follow up on hypertension, prediabetes, hyperlipidemia, vitamin D def.  Date of last medicare wellness visit 03/2014  Her blood pressure has been controlled at home, today their BP is BP: (!) 142/78 mmHg She does not workout. She denies chest pain, shortness of breath, dizziness.  She is on cholesterol medication, pravastatin and denies myalgias. Her cholesterol is at goal. The cholesterol last visit was:   Lab Results  Component Value Date   CHOL 180 02/02/2015   HDL 52 02/02/2015   LDLCALC 83 02/02/2015   TRIG 224* 02/02/2015   CHOLHDL 3.5 02/02/2015   She has been working on diet and exercise for prediabetes, and denies foot ulcerations, paresthesia of the feet and polydipsia. Last A1C in the office was:  Lab Results  Component Value Date   HGBA1C 5.6 02/02/2015   Patient is on Vitamin D supplement. Lab Results  Component Value Date   VD25OH 48 02/02/2015   She is on fosamax and last DEXA was 2014, she is due for DEXA.  Sunday went out to eat for her birthday, had Ab pain, diarrhea that night and then she got clammy, light headed, dizzy,  went back to bed and that night felt better. Her daughter is an Therapist, sports and states her heart rate was elevated and she had an extra beat. She is a smoker.   Medication Review Current Outpatient Prescriptions on File Prior  to Visit  Medication Sig Dispense Refill  . alendronate (FOSAMAX) 70 MG tablet Take 1 tablet (70 mg total) by mouth once a week. Take with a full glass of water on an empty stomach. 12 tablet 3  . ALPRAZolam (XANAX) 1 MG tablet 1/2 to 1 tablet 2 to 3 x daily as needed for anxiety or sleep 270 tablet 1  . CALCIUM PO Take 2 tablets by mouth daily. 600 mg  2 per day    . Cholecalciferol (VITAMIN D PO) Take 2,000 Units by mouth daily.     . Multiple Vitamin (MULTIVITAMIN PO) Take 2 tablets by mouth daily.      . pravastatin (PRAVACHOL) 40 MG tablet Take 1 tablet (40 mg total) by mouth daily. 90 tablet 4   No current facility-administered medications on file prior to visit.   Allergies  Allergen Reactions  . Fish Oil Diarrhea     Current Problems (verified) Patient Active Problem List   Diagnosis Date Noted  . Medication management 02/02/2015  . Essential hypertension 11/07/2013  . Mixed hyperlipidemia 11/07/2013  . Abnormal glucose 11/07/2013  . Obstructive chronic bronchitis without exacerbation 11/07/2013  . Vitamin D deficiency 11/07/2013  . Diverticulosis of colon (without mention of hemorrhage) 09/10/2011  . Colon polyp 09/10/2011    Screening Tests Health Maintenance  Topic Date Due  . COLONOSCOPY  09/09/2014  . INFLUENZA VACCINE  07/30/2015  . MAMMOGRAM  02/14/2017  . TETANUS/TDAP  02/27/2019  . DEXA SCAN  Completed  . ZOSTAVAX  Completed  . PNA vac Low Risk Adult  Completed     Immunization History  Administered Date(s) Administered  . Influenza, High Dose Seasonal PF 08/29/2013  . Pneumococcal Conjugate-13 02/02/2015  . Pneumococcal Polysaccharide-23 03/29/2010  . Tdap 02/26/2009  . Zoster 12/29/2008   Preventative care: Last colonoscopy: 09/10/11 Last mammogram: 02/14/2015 Last pap smear/pelvic exam:> 5 years Patient declines   DEXA:10/13/13 Osteoporosis- will get with MGM in Feb 2016 CXR:01/2015 COPD  Prior vaccinations: TD or Tdap:  02/2009  Influenza:08/2013 Pneumococcal: 03/2010 Prevnar 13: 2016 Shingles/Zostavax: 2010  Names of Other Physician/Practitioners you currently use: Patient Care Team: [name], MD as PCP - General (Internal Medicine) Milus Banister, MD as Attending Physician (Gastroenterology) Sable Feil, MD as Consulting Physician (Gastroenterology) Rozetta Nunnery, MD as Consulting Physician (Otolaryngology) Druscilla Brownie, MD as Consulting Physician (Dermatology) [employer], (Eye)  History reviewed:  Past Medical History  Diagnosis Date  . Hyperlipidemia   . Osteoporosis   . Emphysema of lung   . Hypertension   . GERD (gastroesophageal reflux disease)   . Pre-diabetes   . Kidney stones   . Colon polyps   . Anxiety   . Insomnia    Past Surgical History  Procedure Laterality Date  . Hysterectomy    . Colonoscopy    . Polypectomy     Family History  Problem Relation Age of Onset  . Heart disease Mother   . Breast cancer Mother   . Heart disease Father   . Diabetes Father   . Colon cancer Neg Hx   . Esophageal cancer Neg Hx   . Stomach cancer Neg Hx   . Cancer Brother     Lung  . Bipolar disorder Daughter  Risk Factors: Osteoporosis: postmenopausal estrogen deficiency History of fracture in the past year: no  Tobacco History  Substance Use Topics  . Smoking status: Former Smoker -- 0.50 packs/day for 32 years    Quit date: 08/29/2014  . Smokeless tobacco: Not on file  . Alcohol Use: No   She does smoke. 41 years.  Are there smokers in your home (other than you)?  No  Alcohol Current alcohol use: none  Caffeine Current caffeine use: coffee 10 /day  Exercise Exercise limitations: The patient has no exercise limitations. Current exercise: housecleaning, walking and yard work  Nutrition/Diet Current diet: in general, a "healthy" diet    Cardiac risk factors: advanced age (older than 52 for men, 42 for women) and  dyslipidemia.  Depression Screen   Q1: Over the past two weeks, have you felt down, depressed or hopeless? No  Q2: Over the past two weeks, have you felt little interest or pleasure in doing things? No  Have you lost interest or pleasure in daily life? No  Do you often feel hopeless? No  Do you cry easily over simple problems? No  Activities of Daily Living In your present state of health, do you have any difficulty performing the following activities?:  Driving? No Managing money?  No Feeding yourself? No Getting from bed to chair? No Climbing a flight of stairs? No Preparing food and eating?: No Bathing or showering? No Getting dressed: No Getting to the toilet? No Using the toilet:No Moving around from place to place: No In the past year have you fallen or had a near fall?:No   Are you sexually active?  No  Do you have more than one partner?  No  Vision Difficulties: No  Hearing Difficulties:Mild loss screen at Dr Wynelle Link 09/27/13 Do you often ask people to speak up or repeat themselves? No Do you experience ringing or noises in your ears? No Do you have difficulty understanding soft or whispered voices? No  Cognition  Do you feel that you have a problem with memory?No  Do you often misplace items? No  Do you feel safe at home?  Yes  Advanced directives Does patient have a [place]? Yes, Clydia Llano- daughter Does patient have a Living Will? Yes  Objective:   Blood pressure 142/78, pulse 84, temperature 97.7 F (36.5 C), resp. rate 16, height 5' 3.5" (1.613 m), weight 146 lb (66.225 kg). Body mass index is 25.45 kg/(m^2).  General appearance: alert, no distress, WD/WN,  female Cognitive Testing  Alert? Yes  Normal Appearance?Yes  Oriented to person? Yes  Place? Yes   Time? Yes  Recall of three objects?  Yes  Can perform simple calculations? Yes  Displays appropriate judgment?Yes  Can read the correct time from a watch  face?Yes  HEENT: normocephalic, sclerae anicteric, TMs pearly, nares patent, no discharge or erythema, pharynx normal, crowded mouth.  Oral cavity: MMM, no lesions Neck: supple, no lymphadenopathy, no thyromegaly, no masses Heart: RRR, normal S1, S2, no murmurs Lungs: CTA bilaterally, no wheezes, rhonchi, or rales Abdomen: +bs, soft, non tender, non distended, no masses, no hepatomegaly, no splenomegaly Musculoskeletal: nontender, no swelling, no obvious deformity Extremities: no edema, no cyanosis, no clubbing Pulses: 2+ symmetric, upper and lower extremities, normal cap refill Skin: Palms of hands and soles of feet with mild erythema/scaling. Neurological: alert, oriented x 3, CN2-12 intact, strength normal upper extremities and lower extremities, sensation normal throughout, DTRs 2+ throughout, no cerebellar signs, gait normal Psychiatric: normal  affect, behavior normal, pleasant  Breast: Patient declines Gyn:  Patient declines  Rectal: Patient declines   Medicare Attestation I have personally reviewed: The patient's medical and social history Their use of alcohol, tobacco or illicit drugs Their current medications and supplements The patient's functional ability including ADLs,fall risks, home safety risks, cognitive, and hearing and visual impairment Diet and physical activities Evidence for depression or mood disorders  The patient's weight, height, BMI, and visual acuity have been recorded in the chart.  I have made referrals, counseling, and provided education to the patient based on review of the above and I have provided the patient with a written personalized care plan for preventive services.     Vicie Mutters, PA-C   06/21/2015

## 2015-06-22 LAB — BASIC METABOLIC PANEL WITH GFR
BUN: 20 mg/dL (ref 6–23)
CO2: 25 mEq/L (ref 19–32)
Calcium: 9.3 mg/dL (ref 8.4–10.5)
Chloride: 105 mEq/L (ref 96–112)
Creat: 0.96 mg/dL (ref 0.50–1.10)
GFR, EST AFRICAN AMERICAN: 69 mL/min
GFR, EST NON AFRICAN AMERICAN: 60 mL/min
Glucose, Bld: 87 mg/dL (ref 70–99)
Potassium: 4.2 mEq/L (ref 3.5–5.3)
SODIUM: 142 meq/L (ref 135–145)

## 2015-06-22 LAB — HEPATIC FUNCTION PANEL
ALBUMIN: 4.3 g/dL (ref 3.5–5.2)
ALK PHOS: 57 U/L (ref 39–117)
ALT: 17 U/L (ref 0–35)
AST: 15 U/L (ref 0–37)
BILIRUBIN TOTAL: 0.3 mg/dL (ref 0.2–1.2)
Bilirubin, Direct: 0.1 mg/dL (ref 0.0–0.3)
Indirect Bilirubin: 0.2 mg/dL (ref 0.2–1.2)
Total Protein: 7 g/dL (ref 6.0–8.3)

## 2015-06-22 LAB — TSH: TSH: 0.522 u[IU]/mL (ref 0.350–4.500)

## 2015-06-22 LAB — LIPID PANEL
Cholesterol: 162 mg/dL (ref 0–200)
HDL: 39 mg/dL — ABNORMAL LOW (ref >=46)
LDL CALC: 77 mg/dL (ref 0–99)
TRIGLYCERIDES: 230 mg/dL — AB (ref <150)
Total CHOL/HDL Ratio: 4.2 Ratio
VLDL: 46 mg/dL — ABNORMAL HIGH (ref 0–40)

## 2015-06-22 LAB — MAGNESIUM: MAGNESIUM: 2.3 mg/dL (ref 1.5–2.5)

## 2015-06-22 LAB — INSULIN, FASTING: Insulin fasting, serum: 8.5 u[IU]/mL (ref 2.0–19.6)

## 2015-08-30 ENCOUNTER — Ambulatory Visit (INDEPENDENT_AMBULATORY_CARE_PROVIDER_SITE_OTHER): Payer: Medicare Other | Admitting: Internal Medicine

## 2015-08-30 ENCOUNTER — Encounter: Payer: Self-pay | Admitting: Internal Medicine

## 2015-08-30 VITALS — BP 134/72 | HR 84 | Temp 97.0°F | Resp 16 | Ht 64.0 in | Wt 144.0 lb

## 2015-08-30 DIAGNOSIS — Z6825 Body mass index (BMI) 25.0-25.9, adult: Secondary | ICD-10-CM

## 2015-08-30 DIAGNOSIS — E782 Mixed hyperlipidemia: Secondary | ICD-10-CM | POA: Diagnosis not present

## 2015-08-30 DIAGNOSIS — I10 Essential (primary) hypertension: Secondary | ICD-10-CM | POA: Diagnosis not present

## 2015-08-30 DIAGNOSIS — E559 Vitamin D deficiency, unspecified: Secondary | ICD-10-CM | POA: Diagnosis not present

## 2015-08-30 DIAGNOSIS — R7309 Other abnormal glucose: Secondary | ICD-10-CM | POA: Diagnosis not present

## 2015-08-30 DIAGNOSIS — Z79899 Other long term (current) drug therapy: Secondary | ICD-10-CM

## 2015-08-30 LAB — BASIC METABOLIC PANEL WITH GFR
BUN: 24 mg/dL (ref 7–25)
CALCIUM: 9.8 mg/dL (ref 8.6–10.4)
CO2: 26 mmol/L (ref 20–31)
CREATININE: 1.12 mg/dL — AB (ref 0.60–0.93)
Chloride: 104 mmol/L (ref 98–110)
GFR, EST AFRICAN AMERICAN: 57 mL/min — AB (ref >=60)
GFR, Est Non African American: 50 mL/min — ABNORMAL LOW (ref >=60)
Glucose, Bld: 96 mg/dL (ref 65–99)
POTASSIUM: 4 mmol/L (ref 3.5–5.3)
Sodium: 140 mmol/L (ref 135–146)

## 2015-08-30 LAB — TSH: TSH: 0.779 u[IU]/mL (ref 0.350–4.500)

## 2015-08-30 LAB — CBC WITH DIFFERENTIAL/PLATELET
BASOS ABS: 0 10*3/uL (ref 0.0–0.1)
Basophils Relative: 0 % (ref 0–1)
Eosinophils Absolute: 0.2 10*3/uL (ref 0.0–0.7)
Eosinophils Relative: 2 % (ref 0–5)
HCT: 42.2 % (ref 36.0–46.0)
Hemoglobin: 13.9 g/dL (ref 12.0–15.0)
LYMPHS PCT: 22 % (ref 12–46)
Lymphs Abs: 2.1 10*3/uL (ref 0.7–4.0)
MCH: 29.7 pg (ref 26.0–34.0)
MCHC: 32.9 g/dL (ref 30.0–36.0)
MCV: 90.2 fL (ref 78.0–100.0)
MONOS PCT: 7 % (ref 3–12)
MPV: 10.6 fL (ref 8.6–12.4)
Monocytes Absolute: 0.7 10*3/uL (ref 0.1–1.0)
NEUTROS ABS: 6.5 10*3/uL (ref 1.7–7.7)
NEUTROS PCT: 69 % (ref 43–77)
PLATELETS: 297 10*3/uL (ref 150–400)
RBC: 4.68 MIL/uL (ref 3.87–5.11)
RDW: 13.8 % (ref 11.5–15.5)
WBC: 9.4 10*3/uL (ref 4.0–10.5)

## 2015-08-30 LAB — MAGNESIUM: MAGNESIUM: 1.9 mg/dL (ref 1.5–2.5)

## 2015-08-30 LAB — HEPATIC FUNCTION PANEL
ALBUMIN: 4 g/dL (ref 3.6–5.1)
ALT: 24 U/L (ref 6–29)
AST: 17 U/L (ref 10–35)
Alkaline Phosphatase: 64 U/L (ref 33–130)
BILIRUBIN TOTAL: 0.4 mg/dL (ref 0.2–1.2)
Bilirubin, Direct: 0.1 mg/dL (ref <=0.2)
Indirect Bilirubin: 0.3 mg/dL (ref 0.2–1.2)
TOTAL PROTEIN: 6.5 g/dL (ref 6.1–8.1)

## 2015-08-30 LAB — HEMOGLOBIN A1C
Hgb A1c MFr Bld: 5.6 % (ref <5.7)
MEAN PLASMA GLUCOSE: 114 mg/dL (ref <117)

## 2015-08-30 LAB — LIPID PANEL
CHOLESTEROL: 176 mg/dL (ref 125–200)
HDL: 38 mg/dL — AB (ref >=46)
LDL Cholesterol: 84 mg/dL (ref <130)
TRIGLYCERIDES: 271 mg/dL — AB (ref <150)
Total CHOL/HDL Ratio: 4.6 Ratio (ref <=5.0)
VLDL: 54 mg/dL — ABNORMAL HIGH (ref <30)

## 2015-08-30 MED ORDER — ASPIRIN 81 MG PO TBEC: DELAYED_RELEASE_TABLET | ORAL | Status: DC

## 2015-08-30 NOTE — Patient Instructions (Signed)

## 2015-08-30 NOTE — Progress Notes (Signed)
Patient ID: Alison Campbell, female   DOB: December 23, 1946, 69 y.o.   MRN: [id number]   This very nice 69 y.o. MWF presents for 3 month follow up with Hypertension, Hyperlipidemia, Pre-Diabetes and Vitamin D Deficiency.    Patient has hx/o labile HTN & is monitored expectantly.  BP has been controlled and today's BP: 134/72 mmHg. Patient has had no complaints of any cardiac type chest pain, palpitations, dyspnea/orthopnea/PND, dizziness, claudication, or dependent edema.   Hyperlipidemia is controlled with diet & meds. Patient denies myalgias or other med SE's. Last Lipids were at goal - Cholesterol 162; HDL 39; LDL 77; and elevated Triglycerides 230 on 06/21/2015.   Also, the patient has history of PreDiabetes and has had no symptoms of reactive hypoglycemia, diabetic polys, paresthesias or visual blurring.  Last A1c was 5.7% on 06/21/2015.   Further, the patient also has history of Vitamin D Deficiency and supplements vitamin D without any suspected side-effects. Last vitamin D was  48 on 02/02/2015.     Medication Sig  . alendronate (FOSAMAX) 70 MG tablet Take 1 tablet once a week.   . ALPRAZolam (XANAX) 1 MG tablet 1/2 to 1 tablet 2 to 3 x daily as needed for anxiety or sleep  . CALCIUM  600 mg Take 2 tablets  daily.   2 per day  . VITAMIN D  Take 2,000 Units  daily.   . Multiple Vitamin  Take 2 tablets  daily.    . pravastatin  40 MG tablet Take 1 tab daily.   Allergies  Allergen Reactions  . Fish Oil Diarrhea   PMHx:   Past Medical History  Diagnosis Date  . Hyperlipidemia   . Osteoporosis   . Emphysema of lung   . Hypertension   . GERD (gastroesophageal reflux disease)   . Pre-diabetes   . Kidney stones   . Colon polyps   . Anxiety   . Insomnia    Immunization History  Administered Date(s) Administered  . Influenza, High Dose Seasonal PF 08/29/2013  . Pneumococcal Conjugate-13 02/02/2015  . Pneumococcal Polysaccharide-23 03/29/2010  . Tdap 02/26/2009  . Zoster 12/29/2008    Past Surgical History  Procedure Laterality Date  . Hysterectomy    . Colonoscopy    . Polypectomy     FHx:    Reviewed / unchanged  SHx:    Reviewed / unchanged  Systems Review:  Constitutional: Denies fever, chills, wt changes, headaches, insomnia, fatigue, night sweats, change in appetite. Eyes: Denies redness, blurred vision, diplopia, discharge, itchy, watery eyes.  ENT: Denies discharge, congestion, post nasal drip, epistaxis, sore throat, earache, hearing loss, dental pain, tinnitus, vertigo, sinus pain, snoring.  CV: Denies chest pain, palpitations, irregular heartbeat, syncope, dyspnea, diaphoresis, orthopnea, PND, claudication or edema. Respiratory: denies cough, dyspnea, DOE, pleurisy, hoarseness, laryngitis, wheezing.  Gastrointestinal: Denies dysphagia, odynophagia, heartburn, reflux, water brash, abdominal pain or cramps, nausea, vomiting, bloating, diarrhea, constipation, hematemesis, melena, hematochezia  or hemorrhoids. Genitourinary: Denies dysuria, frequency, urgency, nocturia, hesitancy, discharge, hematuria or flank pain. Musculoskeletal: Denies arthralgias, myalgias, stiffness, jt. swelling, pain, limping or strain/sprain.  Skin: Denies pruritus, rash, hives, warts, acne, eczema or change in skin lesion(s). Neuro: No weakness, tremor, incoordination, spasms, paresthesia or pain. Psychiatric: Denies confusion, memory loss or sensory loss. Endo: Denies change in weight, skin or hair change.  Heme/Lymph: No excessive bleeding, bruising or enlarged lymph nodes.  Physical Exam  BP 134/72 mmHg  Pulse 84  Temp(Src) 97 F (36.1 C)  Resp 16  Ht 5'  4" (1.626 m)  Wt 144 lb (65.318 kg)  BMI 24.71 kg/m2  Appears well nourished and in no distress. Eyes: PERRLA, EOMs, conjunctiva no swelling or erythema. Sinuses: No frontal/maxillary tenderness ENT/Mouth: EAC's clear, TM's nl w/o erythema, bulging. Nares clear w/o erythema, swelling, exudates. Oropharynx clear  without erythema or exudates. Oral hygiene is good. Tongue normal, non obstructing. Hearing intact.  Neck: Supple. Thyroid nl. Car 2+/2+ without bruits, nodes or JVD. Chest: Respirations nl with BS clear & equal w/o rales, rhonchi, wheezing or stridor.  Cor: Heart sounds normal w/ regular rate and rhythm without sig. murmurs, gallops, clicks, or rubs. Peripheral pulses normal and equal  without edema.  Abdomen: Soft & bowel sounds normal. Non-tender w/o guarding, rebound, hernias, masses, or organomegaly.  Lymphatics: Unremarkable.  Musculoskeletal: Full ROM all peripheral extremities, joint stability, 5/5 strength, and normal gait.  Skin: Warm, dry without exposed rashes, lesions or ecchymosis apparent.  Neuro: Cranial nerves intact, reflexes equal bilaterally. Sensory-motor testing grossly intact. Tendon reflexes grossly intact.  Pysch: Alert & oriented x 3.  Insight and judgement nl & appropriate. No ideations.  Assessment and Plan:  1. Essential hypertension  - TSH  2. Mixed hyperlipidemia  - Lipid panel  3. Abnormal glucose  - Hemoglobin A1c - Insulin, random  4. Vitamin D deficiency  - Vit D  25 hydroxy   5. Medication management  - CBC with Differential/Platelet - BASIC METABOLIC PANEL WITH GFR - Hepatic function panel - Magnesium  6. BMI 25.0-25.9,adult   Recommended regular exercise, BP monitoring, weight control, and discussed med and SE's. Recommended labs to assess and monitor clinical status. Further disposition pending results of labs. Over 30 minutes of exam, counseling, chart review was performed

## 2015-08-31 LAB — VITAMIN D 25 HYDROXY (VIT D DEFICIENCY, FRACTURES): VIT D 25 HYDROXY: 43 ng/mL (ref 30–100)

## 2015-08-31 LAB — INSULIN, RANDOM: INSULIN: 28.1 u[IU]/mL — AB (ref 2.0–19.6)

## 2015-10-25 ENCOUNTER — Ambulatory Visit (INDEPENDENT_AMBULATORY_CARE_PROVIDER_SITE_OTHER): Payer: Medicare Other | Admitting: Internal Medicine

## 2015-10-25 ENCOUNTER — Encounter: Payer: Self-pay | Admitting: Internal Medicine

## 2015-10-25 VITALS — BP 122/70 | HR 84 | Temp 98.0°F | Resp 16 | Ht 63.5 in | Wt 142.0 lb

## 2015-10-25 DIAGNOSIS — Z23 Encounter for immunization: Secondary | ICD-10-CM | POA: Diagnosis not present

## 2015-10-25 DIAGNOSIS — M940 Chondrocostal junction syndrome [Tietze]: Secondary | ICD-10-CM | POA: Diagnosis not present

## 2015-10-25 DIAGNOSIS — R0781 Pleurodynia: Secondary | ICD-10-CM

## 2015-10-25 MED ORDER — PREDNISONE 20 MG PO TABS: ORAL_TABLET | ORAL | Status: DC

## 2015-10-25 NOTE — Progress Notes (Signed)
   Subjective:    Patient ID: Alison Campbell, female    DOB: 12-Sep-1946, 69 y.o.   MRN: [id number]  HPI  Patient presents to the office for evaluation of chest pain which has been going on x 1 month.  She reports that she hit her chest on the corner of the dressing table.  She has had pain going on since then.  She reports that the pain is aching and sometimes moderate to severe.  She can take tylenol and also aspirin which helps to relieve the pain temporarily.  She does have history of both costochonritis and also osteoporosis.  She reports that heavy lifting exacerbates the pain.  She reports that tight bras and heat tend to make her chest feel better.    Review of Systems  Constitutional: Negative for fever and chills.  HENT: Negative for congestion, ear pain, postnasal drip, rhinorrhea, sinus pressure and sore throat.   Respiratory: Negative for chest tightness and shortness of breath.   Cardiovascular: Positive for chest pain. Negative for palpitations and leg swelling.  Gastrointestinal: Negative for nausea and vomiting.       Objective:   Physical Exam  Constitutional: She is oriented to person, place, and time. She appears well-developed and well-nourished. No distress.  HENT:  Head: Normocephalic.  Mouth/Throat: Oropharynx is clear and moist. No oropharyngeal exudate.  Eyes: Conjunctivae are normal. No scleral icterus.  Neck: Normal range of motion. Neck supple. No JVD present. No thyromegaly present.  Cardiovascular: Normal rate, regular rhythm, normal heart sounds and intact distal pulses.  Exam reveals no gallop and no friction rub.   No murmur heard. Pulmonary/Chest: Effort normal and breath sounds normal. No respiratory distress. She has no wheezes. She has no rales. She exhibits tenderness.  No palpable crepitus of the bones or skin.  Mildly tender to palpation of the breast bone.    Abdominal: Soft. Bowel sounds are normal. She exhibits no distension and no mass. There is  no tenderness. There is no rebound and no guarding.  Musculoskeletal: Normal range of motion.  Lymphadenopathy:    She has no cervical adenopathy.  Neurological: She is alert and oriented to person, place, and time.  Skin: Skin is warm and dry. She is not diaphoretic.  Psychiatric: She has a normal mood and affect. Her behavior is normal. Judgment and thought content normal.  Nursing note and vitals reviewed.   Filed Vitals:   10/25/15 1442  BP: 122/70  Pulse: 84  Temp: 98 F (36.7 C)  Resp: 16          Assessment & Plan:    1. Costochondritis -prednisone -cont heat pad -tylenol and aspirin  2. Rib pain on right side   3. Need for prophylactic vaccination and inoculation against influenza -flu

## 2015-10-25 NOTE — Patient Instructions (Signed)
Rib Contusion A rib contusion is a deep bruise on your rib area. Contusions are the result of a blunt trauma that causes bleeding and injury to the tissues under the skin. A rib contusion may involve bruising of the ribs and of the skin and muscles in the area. The skin overlying the contusion may turn blue, purple, or yellow. Minor injuries will give you a painless contusion, but more severe contusions may stay painful and swollen for a few weeks. CAUSES  A contusion is usually caused by a blow, trauma, or direct force to an area of the body. This often occurs while playing contact sports. SYMPTOMS  Swelling and redness of the injured area.  Discoloration of the injured area.  Tenderness and soreness of the injured area.  Pain with or without movement. DIAGNOSIS  The diagnosis can be made by taking a medical history and performing a physical exam. An X-ray, CT scan, or MRI may be needed to determine if there were any associated injuries, such as broken bones (fractures) or internal injuries. TREATMENT  Often, the best treatment for a rib contusion is rest. Icing or applying cold compresses to the injured area may help reduce swelling and inflammation. Deep breathing exercises may be recommended to reduce the risk of partial lung collapse and pneumonia. Over-the-counter or prescription medicines may also be recommended for pain control. HOME CARE INSTRUCTIONS   Apply ice to the injured area:  Put ice in a plastic bag.  Place a towel between your skin and the bag.  Leave the ice on for 20 minutes, 2-3 times per day.  Take medicines only as directed by your health care provider.  Rest the injured area. Avoid strenuous activity and any activities or movements that cause pain. Be careful during activities and avoid bumping the injured area.  Perform deep-breathing exercises as directed by your health care provider.  Do not lift anything that is heavier than 5 lb (2.3 kg) until your  health care provider approves.  Do not use any tobacco products, including cigarettes, chewing tobacco, or electronic cigarettes. If you need help quitting, ask your health care provider. SEEK MEDICAL CARE IF:   You have increased bruising or swelling.  You have pain that is not controlled with treatment.  You have a fever. SEEK IMMEDIATE MEDICAL CARE IF:   You have difficulty breathing or shortness of breath.  You develop a continual cough, or you cough up thick or bloody sputum.  You feel sick to your stomach (nauseous), you throw up (vomit), or you have abdominal pain.   This information is not intended to replace advice given to you by your health care provider. Make sure you discuss any questions you have with your health care provider.   Document Released: 09/09/2001 Document Revised: 01/05/2015 Document Reviewed: 09/26/2014 [employer] Interactive Patient Education 2016 [employer]  

## 2015-11-27 ENCOUNTER — Other Ambulatory Visit: Payer: Self-pay | Admitting: Internal Medicine

## 2016-02-15 ENCOUNTER — Encounter: Payer: Self-pay | Admitting: Gastroenterology

## 2016-02-18 ENCOUNTER — Ambulatory Visit
Admission: RE | Admit: 2016-02-18 | Discharge: 2016-02-18 | Disposition: A | Discharge status: Home / Self Care | Payer: Medicare Other | Source: Ambulatory Visit | Attending: Physician Assistant | Admitting: Physician Assistant

## 2016-02-18 ENCOUNTER — Other Ambulatory Visit: Payer: Self-pay | Admitting: Physician Assistant

## 2016-02-18 DIAGNOSIS — Z1231 Encounter for screening mammogram for malignant neoplasm of breast: Secondary | ICD-10-CM

## 2016-02-18 DIAGNOSIS — M810 Age-related osteoporosis without current pathological fracture: Secondary | ICD-10-CM | POA: Diagnosis not present

## 2016-02-20 ENCOUNTER — Encounter: Payer: Self-pay | Admitting: Internal Medicine

## 2016-02-20 ENCOUNTER — Ambulatory Visit (INDEPENDENT_AMBULATORY_CARE_PROVIDER_SITE_OTHER): Payer: Medicare Other | Admitting: Internal Medicine

## 2016-02-20 VITALS — BP 158/78 | HR 88 | Temp 97.3°F | Resp 16 | Ht 63.5 in | Wt 144.4 lb

## 2016-02-20 DIAGNOSIS — E559 Vitamin D deficiency, unspecified: Secondary | ICD-10-CM

## 2016-02-20 DIAGNOSIS — E782 Mixed hyperlipidemia: Secondary | ICD-10-CM

## 2016-02-20 DIAGNOSIS — Z1389 Encounter for screening for other disorder: Secondary | ICD-10-CM | POA: Diagnosis not present

## 2016-02-20 DIAGNOSIS — Z79899 Other long term (current) drug therapy: Secondary | ICD-10-CM | POA: Diagnosis not present

## 2016-02-20 DIAGNOSIS — F17200 Nicotine dependence, unspecified, uncomplicated: Secondary | ICD-10-CM

## 2016-02-20 DIAGNOSIS — J449 Chronic obstructive pulmonary disease, unspecified: Secondary | ICD-10-CM

## 2016-02-20 DIAGNOSIS — Z1212 Encounter for screening for malignant neoplasm of rectum: Secondary | ICD-10-CM | POA: Diagnosis not present

## 2016-02-20 DIAGNOSIS — I10 Essential (primary) hypertension: Secondary | ICD-10-CM | POA: Diagnosis not present

## 2016-02-20 DIAGNOSIS — R7309 Other abnormal glucose: Secondary | ICD-10-CM | POA: Diagnosis not present

## 2016-02-20 DIAGNOSIS — Z1331 Encounter for screening for depression: Secondary | ICD-10-CM

## 2016-02-20 LAB — CBC WITH DIFFERENTIAL/PLATELET
BASOS PCT: 0 % (ref 0–1)
Basophils Absolute: 0 10*3/uL (ref 0.0–0.1)
EOS ABS: 0.2 10*3/uL (ref 0.0–0.7)
Eosinophils Relative: 3 % (ref 0–5)
HCT: 41.1 % (ref 36.0–46.0)
HEMOGLOBIN: 13.9 g/dL (ref 12.0–15.0)
Lymphocytes Relative: 24 % (ref 12–46)
Lymphs Abs: 1.8 10*3/uL (ref 0.7–4.0)
MCH: 30.9 pg (ref 26.0–34.0)
MCHC: 33.8 g/dL (ref 30.0–36.0)
MCV: 91.3 fL (ref 78.0–100.0)
MONO ABS: 0.5 10*3/uL (ref 0.1–1.0)
MONOS PCT: 7 % (ref 3–12)
MPV: 10.8 fL (ref 8.6–12.4)
NEUTROS ABS: 5 10*3/uL (ref 1.7–7.7)
Neutrophils Relative %: 66 % (ref 43–77)
Platelets: 306 10*3/uL (ref 150–400)
RBC: 4.5 MIL/uL (ref 3.87–5.11)
RDW: 13.5 % (ref 11.5–15.5)
WBC: 7.6 10*3/uL (ref 4.0–10.5)

## 2016-02-20 NOTE — Patient Instructions (Signed)
Recommend Adult Low Dose Aspirin or   coated  Aspirin 81 mg daily   To reduce risk of Colon Cancer 20 %,   Skin Cancer 26 % ,   Melanoma 46%   and   Pancreatic cancer 60%   ++++++++++++++++++++++++++++++++++++++++++++++++++++++ Vitamin D goal   is between 70-100.   Please make sure that you are taking your Vitamin D as directed.   It is very important as a natural anti-inflammatory   helping hair, skin, and nails, as well as reducing stroke and heart attack risk.   It helps your bones and helps with mood.  It also decreases numerous cancer risks so please take it as directed.   Low Vit D is associated with a 200-300% higher risk for CANCER   and 200-300% higher risk for HEART   ATTACK  &  STROKE.   .....................................Marland Kitchen  It is also associated with higher death rate at younger ages,   autoimmune diseases like Rheumatoid arthritis, Lupus, Multiple Sclerosis.     Also many other serious conditions, like depression, Alzheimer's  Dementia, infertility, muscle aches, fatigue, fibromyalgia - just to name a few.  ++++++++++++++++++++++++++++++++++++++++++++++++  Recommend the book "The END of DIETING" by Dr Excell Seltzer   & the book "The END of DIABETES " by Dr Excell Seltzer  At [email or web address] - get book & Audio CD's     Being diabetic has a  300% increased risk for heart attack, stroke, cancer, and alzheimer- type vascular dementia. It is very important that you work harder with diet by avoiding all foods that are white. Avoid white rice (brown & wild rice is OK), white potatoes (sweetpotatoes in moderation is OK), White bread or wheat bread or anything made out of white flour like bagels, donuts, rolls, buns, biscuits, cakes, pastries, cookies, pizza crust, and pasta (made from white flour & egg whites) - vegetarian pasta or spinach or wheat pasta is OK. Multigrain breads like Arnold's or Pepperidge [employer], or multigrain sandwich thins or flatbreads.  Diet,  exercise and weight loss can reverse and cure diabetes in the early stages.  Diet, exercise and weight loss is very important in the control and prevention of complications of diabetes which affects every system in your body, ie. Brain - dementia/stroke, eyes - glaucoma/blindness, heart - heart attack/heart failure, kidneys - dialysis, stomach - gastric paralysis, intestines - malabsorption, nerves - severe painful neuritis, circulation - gangrene & loss of a leg(s), and finally cancer and Alzheimers.    I recommend avoid fried & greasy foods,  sweets/candy, white rice (brown or wild rice or Quinoa is OK), white potatoes (sweet potatoes are OK) - anything made from white flour - bagels, doughnuts, rolls, buns, biscuits,white and wheat breads, pizza crust and traditional pasta made of white flour & egg white(vegetarian pasta or spinach or wheat pasta is OK).  Multi-grain bread is OK - like multi-grain flat bread or sandwich thins. Avoid alcohol in excess. Exercise is also important.    Eat all the vegetables you want - avoid meat, especially red meat and dairy - especially cheese.  Cheese is the most concentrated form of trans-fats which is the worst thing to clog up our arteries. Veggie cheese is OK which can be found in the fresh produce section at [employer] or [employer] or [employer]  ++++++++++++++++++++++++++++++++++++++++++++++++++ DASH Eating Plan  DASH stands for "Dietary Approaches to Stop Hypertension."   The DASH eating plan is a healthy eating plan that has been shown to reduce high blood  pressure (hypertension). Additional health benefits may include reducing the risk of type 2 diabetes mellitus, heart disease, and stroke. The DASH eating plan may also help with weight loss.  WHAT DO I NEED TO KNOW ABOUT THE DASH EATING PLAN?  For the DASH eating plan, you will follow these general guidelines:  Choose foods with a percent daily value for sodium of less than 5% (as listed on the food  label).  Use salt-free seasonings or herbs instead of table salt or sea salt.  Check with your health care provider or pharmacist before using salt substitutes.  Eat lower-sodium products, often labeled as "lower sodium" or "no salt added."  Eat fresh foods.  Eat more vegetables, fruits, and low-fat dairy products.    Choose whole grains. Look for the word "whole" as the first word in the ingredient list.  Choose fish   Limit sweets, desserts, sugars, and sugary drinks.  Choose heart-healthy fats.  Eat veggie cheese   Eat more home-cooked food and less restaurant, buffet, and fast food.  Limit fried foods.  [place]uffaker foods using methods other than frying.  Limit canned vegetables. If you do use them, rinse them well to decrease the sodium.  When eating at a restaurant, ask that your food be prepared with less salt, or no salt if possible.                      WHAT FOODS CAN I EAT?  Read Dr Fara Olden Fuhrman's books on The End of Dieting & The End of Diabetes  Grains  Whole grain or whole wheat bread. Brown rice. Whole grain or whole wheat pasta. Quinoa, bulgur, and whole grain cereals. Low-sodium cereals. Corn or whole wheat flour tortillas. Whole grain cornbread. Whole grain crackers. Low-sodium crackers.  Vegetables  Fresh or frozen vegetables (raw, steamed, roasted, or grilled). Low-sodium or reduced-sodium tomato and vegetable juices. Low-sodium or reduced-sodium tomato sauce and paste. Low-sodium or reduced-sodium canned vegetables.   Fruits  All fresh, canned (in natural juice), or frozen fruits.  Protein Products   All fish and seafood.  Dried beans, peas, or lentils. Unsalted nuts and seeds. Unsalted canned beans.  Dairy  Low-fat dairy products, such as skim or 1% milk, 2% or reduced-fat cheeses, low-fat ricotta or cottage cheese, or plain low-fat yogurt. Low-sodium or reduced-sodium cheeses.  Fats and Oils  Tub margarines without trans fats. Light or  reduced-fat mayonnaise and salad dressings (reduced sodium). Avocado. Safflower, olive, or canola oils. Natural peanut or almond butter.  Other  Unsalted popcorn and pretzels. The items listed above may not be a complete list of recommended foods or beverages. Contact your dietitian for more options.  +++++++++++++++++++++++++++++++++++++++++++  WHAT FOODS ARE NOT RECOMMENDED?  Grains/ White flour or wheat flour  White bread. White pasta. White rice. Refined cornbread. Bagels and croissants. Crackers that contain trans fat.  Vegetables  Creamed or fried vegetables. Vegetables in a . Regular canned vegetables. Regular canned tomato sauce and paste. Regular tomato and vegetable juices.  Fruits  Dried fruits. Canned fruit in light or heavy syrup. Fruit juice.  Meat and Other Protein Products  Meat in general - RED mwaet & White meat.  Fatty cuts of meat. Ribs, chicken wings, bacon, sausage, bologna, salami, chitterlings, fatback, hot dogs, bratwurst, and packaged luncheon meats.  Dairy  Whole or 2% milk, cream, half-and-half, and cream cheese. Whole-fat or sweetened yogurt. Full-fat cheeses or blue cheese. Nondairy creamers and whipped toppings. Processed cheese, cheese spreads, or  cheese curds.  Condiments  Onion and garlic salt, seasoned salt, table salt, and sea salt. Canned and packaged gravies. Worcestershire sauce. Tartar sauce. Barbecue sauce. Teriyaki sauce. Soy sauce, including reduced sodium. Steak sauce. Fish sauce. Oyster sauce. Cocktail sauce. Horseradish. Ketchup and mustard. Meat flavorings and tenderizers. Bouillon cubes. Hot sauce. Tabasco sauce. Marinades. Taco seasonings. Relishes.  Fats and Oils Butter, stick margarine, lard, shortening and bacon fat. Coconut, palm kernel, or palm oils. Regular salad dressings.  Pickles and olives. Salted popcorn and pretzels.  The items listed above may not be a complete list of foods and beverages to avoid.   Preventive  Care for Adults  A healthy lifestyle and preventive care can promote health and wellness. Preventive health guidelines for women include the following key practices.  A routine yearly physical is a good way to check with your health care provider about your health and preventive screening. It is a chance to share any concerns and updates on your health and to receive a thorough exam.  Visit your dentist for a routine exam and preventive care every 6 months. Brush your teeth twice a day and floss once a day. Good oral hygiene prevents tooth decay and gum disease.  The frequency of eye exams is based on your age, health, family medical history, use of contact lenses, and other factors. Follow your health care provider's recommendations for frequency of eye exams.  Eat a healthy diet. Foods like vegetables, fruits, whole grains, low-fat dairy products, and lean protein foods contain the nutrients you need without too many calories. Decrease your intake of foods high in solid fats, added sugars, and salt. Eat the right amount of calories for you.Get information about a proper diet from your health care provider, if necessary.  Regular physical exercise is one of the most important things you can do for your health. Most adults should get at least 150 minutes of moderate-intensity exercise (any activity that increases your heart rate and causes you to sweat) each week. In addition, most adults need muscle-strengthening exercises on 2 or more days a week.  Maintain a healthy weight. The body mass index (BMI) is a screening tool to identify possible weight problems. It provides an estimate of body fat based on height and weight. Your health care provider can find your BMI and can help you achieve or maintain a healthy weight.For adults 20 years and older:  A BMI below 18.5 is considered underweight.  A BMI of 18.5 to 24.9 is normal.  A BMI of 25 to 29.9 is considered overweight.  A BMI of 30 and  above is considered obese.  Maintain normal blood lipids and cholesterol levels by exercising and minimizing your intake of saturated fat. Eat a balanced diet with plenty of fruit and vegetables. If your lipid or cholesterol levels are high, you are over 50, or you are at high risk for heart disease, you may need your cholesterol levels checked more frequently.Ongoing high lipid and cholesterol levels should be treated with medicines if diet and exercise are not working.  If you smoke, find out from your health care provider how to quit. If you do not use tobacco, do not start.  Lung cancer screening is recommended for adults aged 56-80 years who are at high risk for developing lung cancer because of a history of smoking. A yearly low-dose CT scan of the lungs is recommended for people who have at least a 30-pack-year history of smoking and are a current smoker or  have quit within the past 15 years. A pack year of smoking is smoking an average of 1 pack of cigarettes a day for 1 year (for example: 1 pack a day for 30 years or 2 packs a day for 15 years). Yearly screening should continue until the smoker has stopped smoking for at least 15 years. Yearly screening should be stopped for people who develop a health problem that would prevent them from having lung cancer treatment.  Avoid use of street drugs. Do not share needles with anyone. Ask for help if you need support or instructions about stopping the use of drugs.  High blood pressure causes heart disease and increases the risk of stroke.  Ongoing high blood pressure should be treated with medicines if weight loss and exercise do not work.  If you are 52-67 years old, ask your health care provider if you should take aspirin to prevent strokes.  Diabetes screening involves taking a blood sample to check your fasting blood sugar level. This should be done once every 3 years, after age 9, if you are within normal weight and without risk factors for  diabetes. Testing should be considered at a younger age or be carried out more frequently if you are overweight and have at least 1 risk factor for diabetes.  Breast cancer screening is essential preventive care for women. You should practice "breast self-awareness." This means understanding the normal appearance and feel of your breasts and may include breast self-examination. Any changes detected, no matter how small, should be reported to a health care provider. Women in their 45s and 30s should have a clinical breast exam (CBE) by a health care provider as part of a regular health exam every 1 to 3 years. After age 76, women should have a CBE every year. Starting at age 54, women should consider having a mammogram (breast X-ray test) every year. Women who have a family history of breast cancer should talk to their health care provider about genetic screening. Women at a high risk of breast cancer should talk to their health care providers about having an MRI and a mammogram every year.  Breast cancer gene (BRCA)-related cancer risk assessment is recommended for women who have family members with BRCA-related cancers. BRCA-related cancers include breast, ovarian, tubal, and peritoneal cancers. Having family members with these cancers may be associated with an increased risk for harmful changes (mutations) in the breast cancer genes BRCA1 and BRCA2. Results of the assessment will determine the need for genetic counseling and BRCA1 and BRCA2 testing.  Routine pelvic exams to screen for cancer are no longer recommended for nonpregnant women who are considered low risk for cancer of the pelvic organs (ovaries, uterus, and vagina) and who do not have symptoms. Ask your health care provider if a screening pelvic exam is right for you.  If you have had past treatment for cervical cancer or a condition that could lead to cancer, you need Pap tests and screening for cancer for at least 20 years after your  treatment. If Pap tests have been discontinued, your risk factors (such as having a new sexual partner) need to be reassessed to determine if screening should be resumed. Some women have medical problems that increase the chance of getting cervical cancer. In these cases, your health care provider may recommend more frequent screening and Pap tests.    Colorectal cancer can be detected and often prevented. Most routine colorectal cancer screening begins at the age of 73 years and  continues through age 49 years. However, your health care provider may recommend screening at an earlier age if you have risk factors for colon cancer. On a yearly basis, your health care provider may provide home test kits to check for hidden blood in the stool. Use of a small camera at the end of a tube, to directly examine the colon (sigmoidoscopy or colonoscopy), can detect the earliest forms of colorectal cancer. Talk to your health care provider about this at age 44, when routine screening begins. Direct exam of the colon should be repeated every 5-10 years through age 46 years, unless early forms of pre-cancerous polyps or small growths are found.  Osteoporosis is a disease in which the bones lose minerals and strength with aging. This can result in serious bone fractures or breaks. The risk of osteoporosis can be identified using a bone density scan. Women ages 26 years and over and women at risk for fractures or osteoporosis should discuss screening with their health care providers. Ask your health care provider whether you should take a calcium supplement or vitamin D to reduce the rate of osteoporosis.  Menopause can be associated with physical symptoms and risks. Hormone replacement therapy is available to decrease symptoms and risks. You should talk to your health care provider about whether hormone replacement therapy is right for you.  Use sunscreen. Apply sunscreen liberally and repeatedly throughout the day. You  should seek shade when your shadow is shorter than you. Protect yourself by wearing long sleeves, pants, a wide-brimmed hat, and sunglasses year round, whenever you are outdoors.  Once a month, do a whole body skin exam, using a mirror to look at the skin on your back. Tell your health care provider of new moles, moles that have irregular borders, moles that are larger than a pencil eraser, or moles that have changed in shape or color.  Stay current with required vaccines (immunizations).  Influenza vaccine. All adults should be immunized every year.  Tetanus, diphtheria, and acellular pertussis (Td, Tdap) vaccine. Pregnant women should receive 1 dose of Tdap vaccine during each pregnancy. The dose should be obtained regardless of the length of time since the last dose. Immunization is preferred during the 27th-36th week of gestation. An adult who has not previously received Tdap or who does not know her vaccine status should receive 1 dose of Tdap. This initial dose should be followed by tetanus and diphtheria toxoids (Td) booster doses every 10 years. Adults with an unknown or incomplete history of completing a 3-dose immunization series with Td-containing vaccines should begin or complete a primary immunization series including a Tdap dose. Adults should receive a Td booster every 10 years.    Zoster vaccine. One dose is recommended for adults aged 55 years or older unless certain conditions are present.    Pneumococcal 13-valent conjugate (PCV13) vaccine. When indicated, a person who is uncertain of her immunization history and has no record of immunization should receive the PCV13 vaccine. An adult aged 46 years or older who has certain medical conditions and has not been previously immunized should receive 1 dose of PCV13 vaccine. This PCV13 should be followed with a dose of pneumococcal polysaccharide (PPSV23) vaccine. The PPSV23 vaccine dose should be obtained at least 8 weeks after the dose  of PCV13 vaccine. An adult aged 8 years or older who has certain medical conditions and previously received 1 or more doses of PPSV23 vaccine should receive 1 dose of PCV13. The PCV13 vaccine dose should  be obtained 1 or more years after the last PPSV23 vaccine dose.    Pneumococcal polysaccharide (PPSV23) vaccine. When PCV13 is also indicated, PCV13 should be obtained first. All adults aged 72 years and older should be immunized. An adult younger than age 76 years who has certain medical conditions should be immunized. Any person who resides in a nursing home or long-term care facility should be immunized. An adult smoker should be immunized. People with an immunocompromised condition and certain other conditions should receive both PCV13 and PPSV23 vaccines. People with human immunodeficiency virus (HIV) infection should be immunized as soon as possible after diagnosis. Immunization during chemotherapy or radiation therapy should be avoided. Routine use of PPSV23 vaccine is not recommended for American Indians, [place] Natives, or people younger than 65 years unless there are medical conditions that require PPSV23 vaccine. When indicated, people who have unknown immunization and have no record of immunization should receive PPSV23 vaccine. One-time revaccination 5 years after the first dose of PPSV23 is recommended for people aged 19-64 years who have chronic kidney failure, nephrotic syndrome, asplenia, or immunocompromised conditions. People who received 1-2 doses of PPSV23 before age 47 years should receive another dose of PPSV23 vaccine at age 69 years or later if at least 5 years have passed since the previous dose. Doses of PPSV23 are not needed for people immunized with PPSV23 at or after age 77 years.   Preventive Services / Frequency  Ages 39 years and over  Blood pressure check.  Lipid and cholesterol check.  Lung cancer screening. / Every year if you are aged 28-80 years and have a  30-pack-year history of smoking and currently smoke or have quit within the past 15 years. Yearly screening is stopped once you have quit smoking for at least 15 years or develop a health problem that would prevent you from having lung cancer treatment.  Clinical breast exam.** / Every year after age 54 years.  BRCA-related cancer risk assessment.** / For women who have family members with a BRCA-related cancer (breast, ovarian, tubal, or peritoneal cancers).  Mammogram.** / Every year beginning at age 58 years and continuing for as long as you are in good health. Consult with your health care provider.  Pap test.** / Every 3 years starting at age 6 years through age 11 or 53 years with 3 consecutive normal Pap tests. Testing can be stopped between 65 and 70 years with 3 consecutive normal Pap tests and no abnormal Pap or HPV tests in the past 10 years.  Fecal occult blood test (FOBT) of stool. / Every year beginning at age 96 years and continuing until age 60 years. You may not need to do this test if you get a colonoscopy every 10 years.  Flexible sigmoidoscopy or colonoscopy.** / Every 5 years for a flexible sigmoidoscopy or every 10 years for a colonoscopy beginning at age 18 years and continuing until age 79 years.  Hepatitis C blood test.** / For all people born from [birth year] through 1967 and any individual with known risks for hepatitis C.  Osteoporosis screening.** / A one-time screening for women ages 32 years and over and women at risk for fractures or osteoporosis.  Skin self-exam. / Monthly.  Influenza vaccine. / Every year.  Tetanus, diphtheria, and acellular pertussis (Tdap/Td) vaccine.** / 1 dose of Td every 10 years.  Zoster vaccine.** / 1 dose for adults aged 21 years or older.  Pneumococcal 13-valent conjugate (PCV13) vaccine.** / Consult your health care provider.  Pneumococcal polysaccharide (PPSV23) vaccine.** / 1 dose for all adults aged 68 years and older. Screening  for abdominal aortic aneurysm (AAA)  by ultrasound is recommended for people who have history of high blood pressure or who are current or former smokers.

## 2016-02-20 NOTE — Progress Notes (Addendum)
Patient ID: Alison Campbell, female   DOB: 1946/08/03, 69 y.o.   MRN: [id number]  Comprehensive Evaluation &  Examination  This very nice 70 y.o. [facility] presents for a comprehensive evaluation and management of multiple medical co-morbidities.  Patient has been followed for HTN, Prediabetes, Hyperlipidemia and Vitamin D Deficiency.   Patient has hx/o labile HTN and has been monitored proactively. Patient's BP has been controlled at home and patient denies any cardiac symptoms as chest pain, palpitations, shortness of breath, dizziness or ankle swelling. Today's BP is elevated  158/78.   Patient's hyperlipidemia is controlled with diet and medications. Patient self d/c'd her pravastatin due to myalgias with resolution of her sx's. Last lipids on treatment were at goal with Cholesterol 176; HDL 38*; LDL 84; but elevated Triglycerides 271 on 08/30/2015.    Patient has prediabetes predating since 2011 with A1c 6.0% and patient denies reactive hypoglycemic symptoms, visual blurring, diabetic polys, or paresthesias. Last A1c was 5.6% on 08/30/2015.    Finally, patient has history of Vitamin D Deficiency of "35" in 2008 and last Vitamin D was 43 on 08/30/2015.    Medication Sig  . alendronate  70 MG  Take 1 tablet (70 mg total) by mouth once a week. Take with a full glass of water on an empty stomach.  . ALPRAZolam (1 MG  1/2 to 1 tablet 2 to 3 x daily as needed for anxiety or sleep  . aspirin 81 MG EC  Take 1 tablet daily  . CALCIUM  Take 2 tablets by mouth daily. 600 mg  2 per day  . VITAMIN D  Take 2,000 Units by mouth daily.   . Multiple Vitamin  Take 2 tablets by mouth daily.    . pravastatin  40 MG TAKE 1 TABLET (40 MG TOTAL) BY MOUTH DAILY. (Patient not taking: Reported on 02/20/2016)   Allergies  Allergen Reactions  . Fish Oil Diarrhea   Past Medical History  Diagnosis Date  . Hyperlipidemia   . Osteoporosis   . Emphysema of lung ([place])   . Hypertension   . GERD (gastroesophageal reflux disease)    . Pre-diabetes   . Kidney stones   . Colon polyps   . Anxiety   . Insomnia    Health Maintenance  Topic Date Due  . Hepatitis C Screening  02/07/1946  . COLONOSCOPY  09/09/2014  . INFLUENZA VACCINE  07/29/2016  . MAMMOGRAM  02/17/2018  . TETANUS/TDAP  02/27/2019  . DEXA SCAN  Completed  . ZOSTAVAX  Completed  . PNA vac Low Risk Adult  Completed   Immunization History  Administered Date(s) Administered  . Influenza, High Dose Seasonal PF 08/29/2013, 10/25/2015  . Pneumococcal Conjugate-13 02/02/2015  . Pneumococcal Polysaccharide-23 03/29/2010  . Tdap 02/26/2009  . Zoster 12/29/2008   Past Surgical History  Procedure Laterality Date  . Hysterectomy    . Colonoscopy    . Polypectomy     Family History  Problem Relation Age of Onset  . Heart disease Mother   . Breast cancer Mother   . Heart disease Father   . Diabetes Father   . Colon cancer Neg Hx   . Esophageal cancer Neg Hx   . Stomach cancer Neg Hx   . Cancer Brother     Lung  . Bipolar disorder Daughter    Social History  Substance Use Topics  . Smoking status: Current Every Day Smoker -- 0.50 packs/day for 32 years  . Smokeless tobacco: None  .  Alcohol Use: No    ROS Constitutional: Denies fever, chills, weight loss/gain, headaches, insomnia,  night sweats, and change in appetite. Does c/o fatigue. Eyes: Denies redness, blurred vision, diplopia, discharge, itchy, watery eyes.  ENT: Denies discharge, congestion, post nasal drip, epistaxis, sore throat, earache, hearing loss, dental pain, Tinnitus, Vertigo, Sinus pain, snoring.  Cardio: Denies chest pain, palpitations, irregular heartbeat, syncope, dyspnea, diaphoresis, orthopnea, PND, claudication, edema Respiratory: denies cough, dyspnea, DOE, pleurisy, hoarseness, laryngitis, wheezing.  Gastrointestinal: Denies dysphagia, heartburn, reflux, water brash, pain, cramps, nausea, vomiting, bloating, diarrhea, constipation, hematemesis, melena, hematochezia,  jaundice, hemorrhoids Genitourinary: Denies dysuria, frequency, urgency, nocturia, hesitancy, discharge, hematuria, flank pain Breast: Breast lumps, nipple discharge, bleeding.  Musculoskeletal: Denies arthralgia, myalgia, stiffness, Jt. Swelling, pain, limp, and strain/sprain. Denies falls. Skin: Denies puritis, rash, hives, warts, acne, eczema, changing in skin lesion Neuro: No weakness, tremor, incoordination, spasms, paresthesia, pain Psychiatric: Denies confusion, memory loss, sensory loss. Denies Depression. Endocrine: Denies change in weight, skin, hair change, nocturia, and paresthesia, diabetic polys, visual blurring, hyper / hypo glycemic episodes.  Heme/Lymph: No excessive bleeding, bruising, enlarged lymph nodes.  Physical Exam  BP 158/78 mmHg  Pulse 88  Temp(Src) 97.3 F (36.3 C)  Resp 16  Ht 5' 3.5" (1.613 m)  Wt 144 lb 6.4 oz (65.499 kg)  BMI 25.17 kg/m2  General Appearance: Well nourished and in no apparent distress. Eyes: PERRLA, EOMs, conjunctiva no swelling or erythema, normal fundi and vessels. Sinuses: No frontal/maxillary tenderness ENT/Mouth: EACs patent / TMs  nl. Nares clear without erythema, swelling, mucoid exudates. Oral hygiene is good. No erythema, swelling, or exudate. Tongue normal, non-obstructing. Tonsils not swollen or erythematous. Hearing normal.  Neck: Supple, thyroid normal. No bruits, nodes or JVD. Respiratory: Respiratory effort normal.  BS equal and clear bilateral without rales, rhonci, wheezing or stridor. Cardio: Heart sounds are normal with regular rate and rhythm and no murmurs, rubs or gallops. Peripheral pulses are normal and equal bilaterally without edema. No aortic or femoral bruits. Chest: symmetric with normal excursions and percussion. Breasts: Symmetric, without lumps, nipple discharge, retractions, or fibrocystic changes.  Abdomen: Flat, soft, with bowl sounds. Nontender, no guarding, rebound, hernias, masses, or organomegaly.   Lymphatics: Non tender without lymphadenopathy.  Genitourinary:  Musculoskeletal: Full ROM all peripheral extremities, joint stability, 5/5 strength, and normal gait. Skin: Warm and dry without rashes, lesions, cyanosis, clubbing or  ecchymosis.  Neuro: Cranial nerves intact, reflexes equal bilaterally. Normal muscle tone, no cerebellar symptoms. Sensation intact.  Pysch: Alert and oriented X 3, normal affect, Insight and Judgment appropriate.   Assessment and Plan  1. Essential hypertension  - Microalbumin / creatinine urine ratio - EKG 12-Lead - [place], RETROPERITNL ABD,  LTD - TSH  2. Mixed hyperlipidemia  - Lipid panel - TSH  3. Abnormal glucose  - Hemoglobin A1c - Insulin, random  4. Vitamin D deficiency  - VITAMIN D 25 Hydroxy  5. Obstructive chronic bronchitis without exacerbation ([place])   6. Screening for rectal cancer  - POC Hemoccult Bld/Stl   7. Depression screen   8. Medication management  - Urinalysis, Routine w reflex microscopic  - CBC with Differential/Platelet - BASIC METABOLIC PANEL WITH GFR - Hepatic function panel - Magnesium   Continue prudent diet as discussed, weight control, BP monitoring, regular exercise, and medications. Discussed med's effects and SE's. Screening labs and tests as requested with regular follow-up as recommended. Over 40 minutes of exam, counseling, chart review and high complex critical decision making was performed.

## 2016-02-21 ENCOUNTER — Other Ambulatory Visit: Payer: Self-pay | Admitting: Internal Medicine

## 2016-02-21 LAB — MAGNESIUM: MAGNESIUM: 1.9 mg/dL (ref 1.5–2.5)

## 2016-02-21 LAB — MICROALBUMIN / CREATININE URINE RATIO
Creatinine, Urine: 244 mg/dL (ref 20–320)
MICROALB/CREAT RATIO: 6 ug/mg{creat} (ref <30)
Microalb, Ur: 1.5 mg/dL

## 2016-02-21 LAB — BASIC METABOLIC PANEL WITH GFR
BUN: 21 mg/dL (ref 7–25)
CALCIUM: 9.4 mg/dL (ref 8.6–10.4)
CO2: 27 mmol/L (ref 20–31)
CREATININE: 1.12 mg/dL — AB (ref 0.60–0.93)
Chloride: 106 mmol/L (ref 98–110)
GFR, Est African American: 57 mL/min — ABNORMAL LOW (ref >=60)
GFR, Est Non African American: 50 mL/min — ABNORMAL LOW (ref >=60)
GLUCOSE: 114 mg/dL — AB (ref 65–99)
Potassium: 3.9 mmol/L (ref 3.5–5.3)
SODIUM: 142 mmol/L (ref 135–146)

## 2016-02-21 LAB — HEPATIC FUNCTION PANEL
ALBUMIN: 3.8 g/dL (ref 3.6–5.1)
ALT: 16 U/L (ref 6–29)
AST: 14 U/L (ref 10–35)
Alkaline Phosphatase: 53 U/L (ref 33–130)
BILIRUBIN DIRECT: 0.1 mg/dL (ref <=0.2)
Indirect Bilirubin: 0.2 mg/dL (ref 0.2–1.2)
TOTAL PROTEIN: 6.4 g/dL (ref 6.1–8.1)
Total Bilirubin: 0.3 mg/dL (ref 0.2–1.2)

## 2016-02-21 LAB — LIPID PANEL
CHOLESTEROL: 216 mg/dL — AB (ref 125–200)
HDL: 38 mg/dL — ABNORMAL LOW (ref >=46)
LDL Cholesterol: 122 mg/dL (ref <130)
Total CHOL/HDL Ratio: 5.7 Ratio — ABNORMAL HIGH (ref <=5.0)
Triglycerides: 282 mg/dL — ABNORMAL HIGH (ref <150)
VLDL: 56 mg/dL — ABNORMAL HIGH (ref <30)

## 2016-02-21 LAB — URINALYSIS, MICROSCOPIC ONLY: YEAST: NONE SEEN [HPF]

## 2016-02-21 LAB — URINALYSIS, ROUTINE W REFLEX MICROSCOPIC
BILIRUBIN URINE: NEGATIVE
GLUCOSE, UA: NEGATIVE
HGB URINE DIPSTICK: NEGATIVE
KETONES UR: NEGATIVE
Nitrite: NEGATIVE
PROTEIN: NEGATIVE
Specific Gravity, Urine: 1.026 (ref 1.001–1.035)
pH: 5 (ref 5.0–8.0)

## 2016-02-21 LAB — TSH: TSH: 0.96 mIU/L

## 2016-02-21 LAB — HEMOGLOBIN A1C
HEMOGLOBIN A1C: 5.6 % (ref <5.7)
MEAN PLASMA GLUCOSE: 114 mg/dL (ref <117)

## 2016-02-21 LAB — VITAMIN D 25 HYDROXY (VIT D DEFICIENCY, FRACTURES): VIT D 25 HYDROXY: 38 ng/mL (ref 30–100)

## 2016-02-21 LAB — INSULIN, RANDOM: INSULIN: 29 u[IU]/mL — AB (ref 2.0–19.6)

## 2016-02-21 MED ORDER — ATORVASTATIN CALCIUM 80 MG PO TABS: ORAL_TABLET | ORAL | Status: DC

## 2016-02-22 NOTE — Addendum Note (Signed)
Addended by: [name] on: 02/22/2016 04:11 PM   Modules accepted: Level of Service

## 2016-03-12 ENCOUNTER — Other Ambulatory Visit: Payer: Self-pay | Admitting: *Deleted

## 2016-03-12 DIAGNOSIS — Z1212 Encounter for screening for malignant neoplasm of rectum: Secondary | ICD-10-CM

## 2016-03-12 LAB — POC HEMOCCULT BLD/STL (HOME/3-CARD/SCREEN)
Card #3 Fecal Occult Blood, POC: NEGATIVE
FECAL OCCULT BLD: NEGATIVE
Fecal Occult Blood, POC: NEGATIVE

## 2016-04-23 DIAGNOSIS — B353 Tinea pedis: Secondary | ICD-10-CM | POA: Diagnosis not present

## 2016-04-23 DIAGNOSIS — B351 Tinea unguium: Secondary | ICD-10-CM | POA: Diagnosis not present

## 2016-05-19 ENCOUNTER — Other Ambulatory Visit: Payer: Self-pay | Admitting: Internal Medicine

## 2016-05-19 DIAGNOSIS — F411 Generalized anxiety disorder: Secondary | ICD-10-CM

## 2016-05-19 MED ORDER — ALPRAZOLAM 1 MG PO TABS: ORAL_TABLET | ORAL | Status: DC

## 2016-05-19 MED ORDER — ALENDRONATE SODIUM 70 MG PO TABS: 70.0000 mg | ORAL_TABLET | ORAL | Status: DC

## 2016-05-20 ENCOUNTER — Ambulatory Visit (INDEPENDENT_AMBULATORY_CARE_PROVIDER_SITE_OTHER): Payer: Medicare Other | Admitting: Internal Medicine

## 2016-05-20 ENCOUNTER — Encounter: Payer: Self-pay | Admitting: Internal Medicine

## 2016-05-20 VITALS — BP 126/70 | HR 92 | Temp 98.2°F | Resp 18 | Ht 63.5 in | Wt 144.0 lb

## 2016-05-20 DIAGNOSIS — Z79899 Other long term (current) drug therapy: Secondary | ICD-10-CM | POA: Diagnosis not present

## 2016-05-20 DIAGNOSIS — E782 Mixed hyperlipidemia: Secondary | ICD-10-CM

## 2016-05-20 DIAGNOSIS — I10 Essential (primary) hypertension: Secondary | ICD-10-CM | POA: Diagnosis not present

## 2016-05-20 DIAGNOSIS — E559 Vitamin D deficiency, unspecified: Secondary | ICD-10-CM | POA: Diagnosis not present

## 2016-05-20 DIAGNOSIS — R7309 Other abnormal glucose: Secondary | ICD-10-CM

## 2016-05-20 LAB — CBC WITH DIFFERENTIAL/PLATELET
BASOS PCT: 1 %
Basophils Absolute: 83 cells/uL (ref 0–200)
EOS PCT: 3 %
Eosinophils Absolute: 249 cells/uL (ref 15–500)
HCT: 40.1 % (ref 35.0–45.0)
Hemoglobin: 13.2 g/dL (ref 11.7–15.5)
Lymphocytes Relative: 21 %
Lymphs Abs: 1743 cells/uL (ref 850–3900)
MCH: 30.3 pg (ref 27.0–33.0)
MCHC: 32.9 g/dL (ref 32.0–36.0)
MCV: 92 fL (ref 80.0–100.0)
MONOS PCT: 7 %
MPV: 9.9 fL (ref 7.5–12.5)
Monocytes Absolute: 581 cells/uL (ref 200–950)
Neutro Abs: 5644 cells/uL (ref 1500–7800)
Neutrophils Relative %: 68 %
PLATELETS: 321 10*3/uL (ref 140–400)
RBC: 4.36 MIL/uL (ref 3.80–5.10)
RDW: 13.6 % (ref 11.0–15.0)
WBC: 8.3 10*3/uL (ref 3.8–10.8)

## 2016-05-20 LAB — BASIC METABOLIC PANEL WITH GFR
BUN: 23 mg/dL (ref 7–25)
CHLORIDE: 106 mmol/L (ref 98–110)
CO2: 26 mmol/L (ref 20–31)
CREATININE: 1.06 mg/dL — AB (ref 0.60–0.93)
Calcium: 9.6 mg/dL (ref 8.6–10.4)
GFR, EST AFRICAN AMERICAN: 61 mL/min (ref >=60)
GFR, Est Non African American: 53 mL/min — ABNORMAL LOW (ref >=60)
Glucose, Bld: 85 mg/dL (ref 65–99)
POTASSIUM: 4.4 mmol/L (ref 3.5–5.3)
SODIUM: 140 mmol/L (ref 135–146)

## 2016-05-20 LAB — HEPATIC FUNCTION PANEL
ALT: 16 U/L (ref 6–29)
AST: 14 U/L (ref 10–35)
Albumin: 4.1 g/dL (ref 3.6–5.1)
Alkaline Phosphatase: 61 U/L (ref 33–130)
BILIRUBIN DIRECT: 0.1 mg/dL (ref <=0.2)
BILIRUBIN TOTAL: 0.3 mg/dL (ref 0.2–1.2)
Indirect Bilirubin: 0.2 mg/dL (ref 0.2–1.2)
Total Protein: 6.6 g/dL (ref 6.1–8.1)

## 2016-05-20 LAB — TSH: TSH: 1.18 m[IU]/L

## 2016-05-20 LAB — LIPID PANEL
CHOLESTEROL: 177 mg/dL (ref 125–200)
HDL: 38 mg/dL — ABNORMAL LOW (ref >=46)
LDL Cholesterol: 90 mg/dL (ref <130)
TRIGLYCERIDES: 246 mg/dL — AB (ref <150)
Total CHOL/HDL Ratio: 4.7 Ratio (ref <=5.0)
VLDL: 49 mg/dL — ABNORMAL HIGH (ref <30)

## 2016-05-20 LAB — HEMOGLOBIN A1C
Hgb A1c MFr Bld: 5.6 % (ref <5.7)
MEAN PLASMA GLUCOSE: 114 mg/dL

## 2016-05-20 MED ORDER — PREDNISONE 20 MG PO TABS: ORAL_TABLET | ORAL | Status: DC

## 2016-05-20 MED ORDER — PSEUDOEPH-BROMPHEN-DM 30-2-10 MG/5ML PO SYRP: 5.0000 mL | ORAL_SOLUTION | Freq: Four times a day (QID) | ORAL | Status: DC | PRN

## 2016-05-21 NOTE — Progress Notes (Signed)
Assessment and Plan:  Hypertension:  -Continue medication,  -monitor blood pressure at home.  -Continue DASH diet.   -Reminder to go to the ER if any CP, SOB, nausea, dizziness, severe HA, changes vision/speech, left arm numbness and tingling, and jaw pain.  Cholesterol: -Continue diet and exercise.  -Check cholesterol.   Pre-diabetes: -Continue diet and exercise.  -Check A1C  Vitamin D Def: -check level -continue medications.   Allergic rhinitis -claritin -prednisone -bromphenermine pseudoephredine -nasal saline  Continue diet and meds as discussed. Further disposition pending results of labs.  HPI 70 y.o. female  presents for 3 month follow up with hypertension, hyperlipidemia, prediabetes and vitamin D.   Her blood pressure has been controlled at home, today their BP is BP: 126/70 mmHg.   She does not workout. She denies chest pain, shortness of breath, dizziness.   She is on cholesterol medication and denies myalgias. Her cholesterol is at goal. The cholesterol last visit was:   Lab Results  Component Value Date   CHOL 177 05/20/2016   HDL 38* 05/20/2016   LDLCALC 90 05/20/2016   TRIG 246* 05/20/2016   CHOLHDL 4.7 05/20/2016     She has been working on diet and exercise for prediabetes, and denies foot ulcerations, hyperglycemia, hypoglycemia , increased appetite, nausea, paresthesia of the feet, polydipsia, polyuria, visual disturbances, vomiting and weight loss. Last A1C in the office was:  Lab Results  Component Value Date   HGBA1C 5.6 05/20/2016    Patient is on Vitamin D supplement.  Lab Results  Component Value Date   VD25OH 38 02/20/2016      Current Medications:  Current Outpatient Prescriptions on File Prior to Visit  Medication Sig Dispense Refill  . alendronate (FOSAMAX) 70 MG tablet Take 1 tablet (70 mg total) by mouth once a week. Take with a full glass of water on an empty stomach. 12 tablet 3  . ALPRAZolam (XANAX) 1 MG tablet 1/2 to 1  tablet 2 to 3 x daily as needed for anxiety or sleep 270 tablet 1  . aspirin 81 MG EC tablet Take 1 tablet daily    . atorvastatin (LIPITOR) 80 MG tablet Take 1/2 to 1 tablet daily or as directed for Cholesterol 30 tablet 5  . CALCIUM PO Take 2 tablets by mouth daily. 600 mg  2 per day    . Cholecalciferol (VITAMIN D PO) Take 2,000 Units by mouth daily.     . Multiple Vitamin (MULTIVITAMIN PO) Take 2 tablets by mouth daily.       No current facility-administered medications on file prior to visit.    Medical History:  Past Medical History  Diagnosis Date  . Hyperlipidemia   . Osteoporosis   . Emphysema of lung ([place])   . Hypertension   . GERD (gastroesophageal reflux disease)   . Pre-diabetes   . Kidney stones   . Colon polyps   . Anxiety   . Insomnia     Allergies:  Allergies  Allergen Reactions  . Fish Oil Diarrhea     Review of Systems:  Review of Systems  Constitutional: Negative for fever, chills and malaise/fatigue.  HENT: Positive for congestion. Negative for ear pain and sore throat.   Respiratory: Negative for cough, sputum production, shortness of breath and wheezing.   Cardiovascular: Negative for chest pain, palpitations and leg swelling.  Gastrointestinal: Negative for heartburn, nausea, diarrhea, constipation, blood in stool and melena.  Genitourinary: Negative.   Skin: Negative.   Neurological: Negative for dizziness,  sensory change, loss of consciousness and headaches.  Psychiatric/Behavioral: Negative for depression. The patient is not nervous/anxious and does not have insomnia.     Family history- Review and unchanged  Social history- Review and unchanged  Physical Exam: BP 126/70 mmHg  Pulse 92  Temp(Src) 98.2 F (36.8 C) (Temporal)  Resp 18  Ht 5' 3.5" (1.613 m)  Wt 144 lb (65.318 kg)  BMI 25.11 kg/m2 Wt Readings from Last 3 Encounters:  05/20/16 144 lb (65.318 kg)  02/20/16 144 lb 6.4 oz (65.499 kg)  10/25/15 142 lb (64.411 kg)     General Appearance: Well nourished well developed, in no apparent distress. Eyes: PERRLA, EOMs, conjunctiva no swelling or erythema ENT/Mouth: Ear canals normal without obstruction, swelling, erythma, discharge.  TMs normal bilaterally.  Oropharynx moist, clear, without exudate, or postoropharyngeal swelling. Neck: Supple, thyroid normal,no cervical adenopathy  Respiratory: Respiratory effort normal, Breath sounds clear A&P without rhonchi, wheeze, or rale.  No retractions, no accessory usage. Cardio: RRR with no MRGs. Brisk peripheral pulses without edema.  Abdomen: Soft, + BS,  Non tender, no guarding, rebound, hernias, masses. Musculoskeletal: Full ROM, 5/5 strength, Normal gait Skin: Warm, dry without rashes, lesions, ecchymosis.  Neuro: Awake and oriented X 3, Cranial nerves intact. Normal muscle tone, no cerebellar symptoms. Psych: Normal affect, Insight and Judgment appropriate.    Starlyn Skeans, PA-C 12:58 PM [facility]

## 2016-05-23 ENCOUNTER — Ambulatory Visit: Payer: Self-pay | Admitting: Internal Medicine

## 2016-05-28 ENCOUNTER — Ambulatory Visit: Payer: Self-pay | Admitting: Internal Medicine

## 2016-08-07 ENCOUNTER — Encounter: Payer: Self-pay | Admitting: Internal Medicine

## 2016-08-07 ENCOUNTER — Other Ambulatory Visit: Payer: Self-pay | Admitting: *Deleted

## 2016-08-07 ENCOUNTER — Ambulatory Visit (INDEPENDENT_AMBULATORY_CARE_PROVIDER_SITE_OTHER): Payer: Medicare Other | Admitting: Internal Medicine

## 2016-08-07 VITALS — BP 122/80 | HR 80 | Temp 97.9°F | Resp 16 | Ht 63.5 in | Wt 148.6 lb

## 2016-08-07 DIAGNOSIS — R1310 Dysphagia, unspecified: Secondary | ICD-10-CM | POA: Diagnosis not present

## 2016-08-07 MED ORDER — ALENDRONATE SODIUM 70 MG PO TABS: 70.0000 mg | ORAL_TABLET | ORAL | 3 refills | Status: DC

## 2016-08-07 NOTE — Patient Instructions (Signed)
Dysphagia Swallowing problems (dysphagia) occur when solids and liquids seem to stick in your throat on the way down to your stomach, or the food takes longer to get to the stomach. Other symptoms include regurgitating food, noises coming from the throat, chest discomfort with swallowing, and a feeling of fullness or the feeling of something being stuck in your throat when swallowing. When blockage in your throat is complete, it may be associated with drooling. CAUSES  Problems with swallowing may occur because of problems with the muscles. The food cannot be propelled in the usual manner into your stomach. You may have ulcers, scar tissue, or inflammation in the tube down which food travels from your mouth to your stomach (esophagus), which blocks food from passing normally into the stomach. Causes of inflammation include:  Acid reflux from your stomach into your esophagus.  Infection.  Radiation treatment for cancer.  Medicines taken without enough fluids to wash them down into your stomach. You may have nerve problems that prevent signals from being sent to the muscles of your esophagus to contract and move your food down to your stomach. Globus pharyngeus is a relatively common problem in which there is a sense of an obstruction or difficulty in swallowing, without any physical abnormalities of the swallowing passages being found. This problem usually improves over time with reassurance and testing to rule out other causes. DIAGNOSIS Dysphagia can be diagnosed and its cause can be determined by tests in which you swallow a white substance that helps illuminate the inside of your throat (contrast medium) while X-rays are taken. Sometimes a flexible telescope that is inserted down your throat (endoscopy) to look at your esophagus and stomach is used. TREATMENT   If the dysphagia is caused by acid reflux or infection, medicines may be used.  If the dysphagia is caused by problems with your  swallowing muscles, swallowing therapy may be used to help you strengthen your swallowing muscles.  If the dysphagia is caused by a blockage or mass, procedures to remove the blockage may be done. HOME CARE INSTRUCTIONS  Try to eat soft food that is easier to swallow and check your weight on a daily basis to be sure that it is not decreasing.  Be sure to drink liquids when sitting upright (not lying down). SEEK MEDICAL CARE IF:  You are losing weight because you are unable to swallow.  You are coughing when you drink liquids (aspiration).  You are coughing up partially digested food. SEEK IMMEDIATE MEDICAL CARE IF:  You are unable to swallow your own saliva .  You are having shortness of breath or a fever, or both.  You have a hoarse voice along with difficulty swallowing. MAKE SURE YOU:  Understand these instructions.  Will watch your condition.  Will get help right away if you are not doing well or get worse.

## 2016-08-07 NOTE — Progress Notes (Signed)
  Subjective:    Patient ID: Alison Campbell, female    DOB: 07-Aug-1946, 70 y.o.   MRN: [id number]  HPI This very nice 70 yo MWF smoker of 40+ yr duration presents with c/o intermittent dysphagia in the hypopharyngeal area. Describes as a sensation of "food stuck  in back of throat". Has occas "acid reflux" from coffee. Denies hoarseness or wheezing. Has occas dry cough.   Outpatient Medications Prior to Visit  Medication Sig  . ALPRAZolam (XANAX) 1 MG tablet 1/2 to 1 tablet 2 to 3 x daily as needed for anxiety or sleep  . aspirin 81 MG EC tablet Take 1 tablet daily  . atorvastatin (LIPITOR) 80 MG tablet Take 1/2 to 1 tablet daily or as directed for Cholesterol  . CALCIUM PO Take 2 tablets by mouth daily. 600 mg  2 per day  . Cholecalciferol (VITAMIN D PO) Take 2,000 Units by mouth daily.   . Multiple Vitamin (MULTIVITAMIN PO) Take 2 tablets by mouth daily.    Marland Kitchen alendronate (FOSAMAX) 70 MG tablet Take 1 tablet (70 mg total) by mouth once a week  . loratadine (CLARITIN) 10 MG tablet Take 10 mg by mouth daily.   Allergies  Allergen Reactions  . Fish Oil Diarrhea   Past Medical History:  Diagnosis Date  . Anxiety   . Colon polyps   . Emphysema of lung ([place])   . GERD (gastroesophageal reflux disease)   . Hyperlipidemia   . Hypertension   . Insomnia   . Kidney stones   . Osteoporosis   . Pre-diabetes    Review of Systems  10 point systems review negative except as above.    Objective:   Physical Exam  BP 122/80   Pulse 80   Temp 97.9 F (36.6 C)   Resp 16   Ht 5' 3.5" (1.613 m)   Wt 148 lb 9.6 oz (67.4 kg)   BMI 25.91 kg/m   HEENT - Eac's patent. TM's Nl. EOM's full. PERRLA. NasoOroPharynx clear. Neck - supple. Nl Thyroid. Carotids 2+ & No bruits, nodes, JVD Chest - Clear equal BS. Cor - Nl HS. RRR w/o sig MGR.  MS- FROM w/o deformities. Muscle power, tone and bulk Nl. Gait Nl. Neuro -  Nl w/o focal abnormalities.    Assessment & Plan:   1. Dysphagia  - Ambulatory  referral to ENT

## 2016-08-11 DIAGNOSIS — R490 Dysphonia: Secondary | ICD-10-CM | POA: Diagnosis not present

## 2016-08-25 ENCOUNTER — Ambulatory Visit (INDEPENDENT_AMBULATORY_CARE_PROVIDER_SITE_OTHER): Payer: Medicare Other | Admitting: Internal Medicine

## 2016-08-25 ENCOUNTER — Encounter: Payer: Self-pay | Admitting: Internal Medicine

## 2016-08-25 VITALS — BP 120/80 | HR 64 | Temp 97.5°F | Resp 16 | Ht 63.5 in | Wt 147.2 lb

## 2016-08-25 DIAGNOSIS — E782 Mixed hyperlipidemia: Secondary | ICD-10-CM | POA: Diagnosis not present

## 2016-08-25 DIAGNOSIS — R7309 Other abnormal glucose: Secondary | ICD-10-CM

## 2016-08-25 DIAGNOSIS — Z79899 Other long term (current) drug therapy: Secondary | ICD-10-CM | POA: Diagnosis not present

## 2016-08-25 DIAGNOSIS — I10 Essential (primary) hypertension: Secondary | ICD-10-CM

## 2016-08-25 DIAGNOSIS — E559 Vitamin D deficiency, unspecified: Secondary | ICD-10-CM | POA: Diagnosis not present

## 2016-08-25 LAB — BASIC METABOLIC PANEL WITH GFR
BUN: 19 mg/dL (ref 7–25)
CHLORIDE: 108 mmol/L (ref 98–110)
CO2: 28 mmol/L (ref 20–31)
CREATININE: 1.15 mg/dL — AB (ref 0.60–0.93)
Calcium: 9.9 mg/dL (ref 8.6–10.4)
GFR, Est African American: 55 mL/min — ABNORMAL LOW (ref >=60)
GFR, Est Non African American: 48 mL/min — ABNORMAL LOW (ref >=60)
Glucose, Bld: 81 mg/dL (ref 65–99)
POTASSIUM: 4.2 mmol/L (ref 3.5–5.3)
Sodium: 142 mmol/L (ref 135–146)

## 2016-08-25 LAB — HEMOGLOBIN A1C
Hgb A1c MFr Bld: 5.3 %
Mean Plasma Glucose: 105 mg/dL

## 2016-08-25 LAB — LIPID PANEL
CHOLESTEROL: 164 mg/dL (ref 125–200)
HDL: 41 mg/dL — ABNORMAL LOW (ref >=46)
LDL Cholesterol: 79 mg/dL (ref <130)
TRIGLYCERIDES: 220 mg/dL — AB (ref <150)
Total CHOL/HDL Ratio: 4 Ratio (ref <=5.0)
VLDL: 44 mg/dL — AB (ref <30)

## 2016-08-25 LAB — CBC WITH DIFFERENTIAL/PLATELET
BASOS ABS: 0 {cells}/uL (ref 0–200)
BASOS PCT: 0 %
EOS ABS: 273 {cells}/uL (ref 15–500)
Eosinophils Relative: 3 %
HCT: 42.5 % (ref 35.0–45.0)
HEMOGLOBIN: 14.2 g/dL (ref 11.7–15.5)
LYMPHS ABS: 2366 {cells}/uL (ref 850–3900)
Lymphocytes Relative: 26 %
MCH: 30.7 pg (ref 27.0–33.0)
MCHC: 33.4 g/dL (ref 32.0–36.0)
MCV: 92 fL (ref 80.0–100.0)
MONO ABS: 637 {cells}/uL (ref 200–950)
MONOS PCT: 7 %
MPV: 10.4 fL (ref 7.5–12.5)
NEUTROS ABS: 5824 {cells}/uL (ref 1500–7800)
Neutrophils Relative %: 64 %
PLATELETS: 356 10*3/uL (ref 140–400)
RBC: 4.62 MIL/uL (ref 3.80–5.10)
RDW: 13.3 % (ref 11.0–15.0)
WBC: 9.1 10*3/uL (ref 3.8–10.8)

## 2016-08-25 LAB — HEPATIC FUNCTION PANEL
ALT: 17 U/L (ref 6–29)
AST: 15 U/L (ref 10–35)
Albumin: 4.3 g/dL (ref 3.6–5.1)
Alkaline Phosphatase: 68 U/L (ref 33–130)
BILIRUBIN INDIRECT: 0.2 mg/dL (ref 0.2–1.2)
Bilirubin, Direct: 0.1 mg/dL (ref <=0.2)
TOTAL PROTEIN: 7.1 g/dL (ref 6.1–8.1)
Total Bilirubin: 0.3 mg/dL (ref 0.2–1.2)

## 2016-08-25 LAB — MAGNESIUM: MAGNESIUM: 2.1 mg/dL (ref 1.5–2.5)

## 2016-08-25 LAB — TSH: TSH: 1.75 mIU/L

## 2016-08-25 NOTE — Patient Instructions (Signed)

## 2016-08-25 NOTE — Progress Notes (Signed)
[facility]                       Alison Campbell, M.D.        Uvaldo Bristle. [name], P.A.-C       Starlyn Skeans, P.A.-C   [facility]                [address] [place] [id number] Telephone [phone] Telefax [phone] ______________________________________________________________________     This very nice 70y.o.WWF presents for 6 month follow up with labile Hypertension, Hyperlipidemia, Pre-Diabetes and Vitamin D Deficiency. Patient also has hx/o Osteoporosis and is on therapy with Alendronate.      Patient is monitored expectantly with hx/o labile HTN & BP has been controlled and today's BP is  120/80. Patient has had no complaints of any cardiac type chest pain, palpitations, dyspnea/orthopnea/PND, dizziness, claudication, or dependent edema.     Hyperlipidemia is controlled with diet & meds. Patient denies myalgias or other med SE's. Last Lipids were at goal albeit elevated Trig's. Lab Results  Component Value Date   CHOL 177 05/20/2016   HDL 38 (L) 05/20/2016   LDLCALC 90 05/20/2016   TRIG 246 (H) 05/20/2016   CHOLHDL 4.7 05/20/2016      Also, the patient has history of PreDiabetes  With 6.0% in 2011and has had no symptoms of reactive hypoglycemia, diabetic polys, paresthesias or visual blurring.  Last A1c was at goal: Lab Results  Component Value Date   HGBA1C 5.6 05/20/2016      Further, the patient also has history of Vitamin D Deficiency  and supplements vitamin D without any suspected side-effects. Last vitamin D was still very low:  Lab Results  Component Value Date   VD25OH 38 02/20/2016   Current Outpatient Prescriptions on File Prior to Visit  Medication Sig  . alendronate (FOSAMAX) 70 MG Take 1 tab once a week.  . ALPRAZolam (XANAX) 1 MG 1/2 to 1 tab 2 to 3 x daily as needed   . aspirin 81 MG EC  Take 1 tablet daily  . CALCIUM PO Take 2 tablets daily. 600 mg   .  VITAMIN D Take 2,000 Units by mouth daily.   . Multiple Vitamin Take 2 tablets by mouth daily.    Marland Kitchen atorvastatin ) 80 MG tablet Take 1/2 to 1 tab daily - takes 1/2 tab 2 x/wk   Allergies  Allergen Reactions  . Fish Oil Diarrhea   PMHx:   Past Medical History:  Diagnosis Date  . Anxiety   . Colon polyps   . Emphysema of lung ([place])   . GERD (gastroesophageal reflux disease)   . Hyperlipidemia   . Hypertension   . Insomnia   . Kidney stones   . Osteoporosis   . Pre-diabetes    Immunization History  Administered Date(s) Administered  . Influenza, High Dose Seasonal PF 08/29/2013, 10/25/2015  . Pneumococcal Conjugate-13 02/02/2015  . Pneumococcal Polysaccharide-23 03/29/2010  . Tdap 02/26/2009  . Zoster 12/29/2008   Past Surgical History:  Procedure Laterality Date  . COLONOSCOPY    . hysterectomy    . POLYPECTOMY     FHx:    Reviewed / unchanged  SHx:    Reviewed / unchanged  Systems Review:  Constitutional: Denies fever, chills, wt changes, headaches, insomnia, fatigue, night sweats, change in appetite. Eyes: Denies redness,  blurred vision, diplopia, discharge, itchy, watery eyes.  ENT: Denies discharge, congestion, post nasal drip, epistaxis, sore throat, earache, hearing loss, dental pain, tinnitus, vertigo, sinus pain, snoring.  CV: Denies chest pain, palpitations, irregular heartbeat, syncope, dyspnea, diaphoresis, orthopnea, PND, claudication or edema. Respiratory: denies cough, dyspnea, DOE, pleurisy, hoarseness, laryngitis, wheezing.  Gastrointestinal: Denies dysphagia, odynophagia, heartburn, reflux, water brash, abdominal pain or cramps, nausea, vomiting, bloating, diarrhea, constipation, hematemesis, melena, hematochezia  or hemorrhoids. Genitourinary: Denies dysuria, frequency, urgency, nocturia, hesitancy, discharge, hematuria or flank pain. Musculoskeletal: Denies arthralgias, myalgias, stiffness, jt. swelling, pain, limping or strain/sprain.  Skin: Denies  pruritus, rash, hives, warts, acne, eczema or change in skin lesion(s). Neuro: No weakness, tremor, incoordination, spasms, paresthesia or pain. Psychiatric: Denies confusion, memory loss or sensory loss. Endo: Denies change in weight, skin or hair change.  Heme/Lymph: No excessive bleeding, bruising or enlarged lymph nodes.  Physical Exam BP 120/80   Pulse 64   Temp 97.5 F (36.4 C)   Resp 16   Ht 5' 3.5" (1.613 m)   Wt 147 lb 3.2 oz (66.8 kg)   BMI 25.67 kg/m   Appears well nourished and in no distress.  Eyes: PERRLA, EOMs, conjunctiva no swelling or erythema. Sinuses: No frontal/maxillary tenderness ENT/Mouth: EAC's clear, TM's nl w/o erythema, bulging. Nares clear w/o erythema, swelling, exudates. Oropharynx clear without erythema or exudates. Oral hygiene is good. Tongue normal, non obstructing. Hearing intact.  Neck: Supple. Thyroid nl. Car 2+/2+ without bruits, nodes or JVD. Chest: Respirations nl with BS clear & equal w/o rales, rhonchi, wheezing or stridor.  Cor: Heart sounds normal w/ regular rate and rhythm without sig. murmurs, gallops, clicks, or rubs. Peripheral pulses normal and equal  without edema.  Abdomen: Soft & bowel sounds normal. Non-tender w/o guarding, rebound, hernias, masses, or organomegaly.  Lymphatics: Unremarkable.  Musculoskeletal: Full ROM all peripheral extremities, joint stability, 5/5 strength, and normal gait.  Skin: Warm, dry without exposed rashes, lesions or ecchymosis apparent.  Neuro: Cranial nerves intact, reflexes equal bilaterally. Sensory-motor testing grossly intact. Tendon reflexes grossly intact.  Pysch: Alert & oriented x 3.  Insight and judgement nl & appropriate. No ideations.  Assessment and Plan:   1. Essential hypertension  - Continue medication, monitor blood pressure at home. Continue DASH diet. Reminder to go to the ER if any CP, SOB, nausea, dizziness, severe HA, changes vision/speech, left arm numbness and tingling and  jaw pain. - TSH  2. Mixed hyperlipidemia  - Continue diet/meds, exercise,& lifestyle modifications. Continue monitor periodic cholesterol/liver & renal functions  - Lipid panel - TSH  3. Abnormal glucose  - Continue diet, exercise, lifestyle modifications. Monitor appropriate labs. - Hemoglobin A1c - Insulin, random  4. Vitamin D deficiency  - Continue supplementation. - VITAMIN D 25 Hydroxy   5. Medication management  - CBC with Differential/Platelet - BASIC METABOLIC PANEL WITH GFR - Hepatic function panel - Magnesium     Recommended regular exercise, BP monitoring, weight control, and discussed med and SE's. Recommended labs to assess and monitor clinical status. Further disposition pending results of labs. Over 30 minutes of exam, counseling, chart review was performed

## 2016-08-26 LAB — INSULIN, RANDOM: INSULIN: 5.5 u[IU]/mL (ref 2.0–19.6)

## 2016-08-26 LAB — VITAMIN D 25 HYDROXY (VIT D DEFICIENCY, FRACTURES): Vit D, 25-Hydroxy: 74 ng/mL (ref 30–100)

## 2016-09-03 ENCOUNTER — Encounter (HOSPITAL_BASED_OUTPATIENT_CLINIC_OR_DEPARTMENT_OTHER): Payer: Self-pay | Admitting: *Deleted

## 2016-09-08 ENCOUNTER — Ambulatory Visit: Payer: Self-pay | Admitting: Otolaryngology

## 2016-09-08 ENCOUNTER — Other Ambulatory Visit: Payer: Self-pay | Admitting: Otolaryngology

## 2016-09-08 NOTE — H&P (Signed)
PREOPERATIVE H&P  Chief Complaint: throat complaints  HPI: Alison Campbell is a 70 y.o. female who presents for evaluation of throat complaints for several months. She describes food frequently getting caght in her throat. And points to a sensation high in the throat midline. On exam she has a very elongated uvula. She also snores. She's taken to the OR for uvulectomy. Hypopharynx and larynx otherwise was normal to exam.  Past Medical History:  Diagnosis Date  . Anxiety   . Colon polyps   . GERD (gastroesophageal reflux disease)    coffee related  . Hyperlipidemia   . Insomnia   . Kidney stones   . Osteoporosis   . Pre-diabetes    Past Surgical History:  Procedure Laterality Date  . ABDOMINAL HYSTERECTOMY    . COLONOSCOPY    . hysterectomy    . POLYPECTOMY     Social History   Social History  . Marital status: Widowed    Spouse name: N/A  . Number of children: N/A  . Years of education: N/A   Social History Main Topics  . Smoking status: Current Every Day Smoker    Packs/day: 0.50    Years: 32.00  . Smokeless tobacco: Never Used  . Alcohol use No  . Drug use: No  . Sexual activity: Not Asked   Other Topics Concern  . None   Social History Narrative  . None   Family History  Problem Relation Age of Onset  . Heart disease Mother   . Breast cancer Mother   . Heart disease Father   . Diabetes Father   . Cancer Brother     Lung  . Bipolar disorder Daughter   . Colon cancer Neg Hx   . Esophageal cancer Neg Hx   . Stomach cancer Neg Hx    Allergies  Allergen Reactions  . Fish Oil Diarrhea   Prior to Admission medications   Medication Sig Start Date End Date Taking? Authorizing Provider  alendronate (FOSAMAX) 70 MG tablet Take 1 tablet (70 mg total) by mouth once a week. Take with a full glass of water on an empty stomach. 08/07/16  Yes [name], MD  ALPRAZolam Duanne Moron) 1 MG tablet 1/2 to 1 tablet 2 to 3 x daily as needed for anxiety or sleep 05/19/16   Yes Starlyn Skeans, PA-C  aspirin 81 MG EC tablet Take 1 tablet daily 08/30/15  Yes [name], MD  atorvastatin (LIPITOR) 80 MG tablet Take 1/2 to 1 tablet daily or as directed for Cholesterol 02/21/16 09/03/16 Yes [name], MD  CALCIUM PO Take 2 tablets by mouth daily. 600 mg  2 per day   Yes Historical Provider, MD  Cholecalciferol (VITAMIN D PO) Take 2,000 Units by mouth daily.    Yes Historical Provider, MD  Multiple Vitamin (MULTIVITAMIN PO) Take 2 tablets by mouth daily.     Yes Historical Provider, MD     Positive ROS: per HPI  All other systems have been reviewed and were otherwise negative with the exception of those mentioned in the HPI and as above.  Physical Exam: There were no vitals filed for this visit.  General: Alert, no acute distress Oral: Normal oral mucosa and tonsils. Very elongated uvula. Nasal: Clear nasal passages Neck: No palpable adenopathy or thyroid nodules Ear: Ear canal is clear with normal appearing TMs Cardiovascular: Regular rate and rhythm, no murmur.  Respiratory: Clear to auscultation Neurologic: Alert and oriented x 3   Assessment/Plan: [place]  for Procedure(s): UVULECTOMY   Melony Overly, MD 09/08/2016 10:29 AM

## 2016-09-09 ENCOUNTER — Ambulatory Visit (HOSPITAL_BASED_OUTPATIENT_CLINIC_OR_DEPARTMENT_OTHER)
Admission: RE | Admit: 2016-09-09 | Discharge: 2016-09-09 | Disposition: A | Discharge status: Home / Self Care | Payer: Medicare Other | Source: Ambulatory Visit | Attending: Otolaryngology | Admitting: Otolaryngology

## 2016-09-09 ENCOUNTER — Ambulatory Visit (HOSPITAL_BASED_OUTPATIENT_CLINIC_OR_DEPARTMENT_OTHER): Payer: Medicare Other | Admitting: Anesthesiology

## 2016-09-09 ENCOUNTER — Encounter (HOSPITAL_BASED_OUTPATIENT_CLINIC_OR_DEPARTMENT_OTHER)
Admission: RE | Disposition: A | Discharge status: Home / Self Care | Payer: Self-pay | Source: Ambulatory Visit | Attending: Otolaryngology

## 2016-09-09 ENCOUNTER — Encounter (HOSPITAL_BASED_OUTPATIENT_CLINIC_OR_DEPARTMENT_OTHER): Payer: Self-pay | Admitting: *Deleted

## 2016-09-09 DIAGNOSIS — E785 Hyperlipidemia, unspecified: Secondary | ICD-10-CM | POA: Insufficient documentation

## 2016-09-09 DIAGNOSIS — K219 Gastro-esophageal reflux disease without esophagitis: Secondary | ICD-10-CM | POA: Diagnosis not present

## 2016-09-09 DIAGNOSIS — R1310 Dysphagia, unspecified: Secondary | ICD-10-CM | POA: Diagnosis not present

## 2016-09-09 DIAGNOSIS — J449 Chronic obstructive pulmonary disease, unspecified: Secondary | ICD-10-CM | POA: Diagnosis not present

## 2016-09-09 DIAGNOSIS — Q386 Other congenital malformations of mouth: Secondary | ICD-10-CM | POA: Diagnosis not present

## 2016-09-09 DIAGNOSIS — Z7982 Long term (current) use of aspirin: Secondary | ICD-10-CM | POA: Insufficient documentation

## 2016-09-09 DIAGNOSIS — R7303 Prediabetes: Secondary | ICD-10-CM | POA: Insufficient documentation

## 2016-09-09 DIAGNOSIS — F17200 Nicotine dependence, unspecified, uncomplicated: Secondary | ICD-10-CM | POA: Insufficient documentation

## 2016-09-09 DIAGNOSIS — M810 Age-related osteoporosis without current pathological fracture: Secondary | ICD-10-CM | POA: Insufficient documentation

## 2016-09-09 DIAGNOSIS — F419 Anxiety disorder, unspecified: Secondary | ICD-10-CM | POA: Insufficient documentation

## 2016-09-09 DIAGNOSIS — K1379 Other lesions of oral mucosa: Secondary | ICD-10-CM | POA: Diagnosis not present

## 2016-09-09 DIAGNOSIS — I10 Essential (primary) hypertension: Secondary | ICD-10-CM | POA: Diagnosis not present

## 2016-09-09 HISTORY — PX: UVULECTOMY: SHX2631

## 2016-09-09 SURGERY — UVULECTOMY
Anesthesia: General

## 2016-09-09 MED ORDER — GLYCOPYRROLATE 0.2 MG/ML IJ SOLN: 0.2000 mg | Freq: Once | INTRAMUSCULAR | Status: DC | PRN

## 2016-09-09 MED ORDER — DEXAMETHASONE SODIUM PHOSPHATE 10 MG/ML IJ SOLN
INTRAMUSCULAR | Status: AC
  Filled 2016-09-09: qty 1

## 2016-09-09 MED ORDER — SUCCINYLCHOLINE CHLORIDE 200 MG/10ML IV SOSY
PREFILLED_SYRINGE | INTRAVENOUS | Status: AC
  Filled 2016-09-09: qty 10

## 2016-09-09 MED ORDER — ONDANSETRON HCL 4 MG/2ML IJ SOLN
INTRAMUSCULAR | Status: DC | PRN
  Administered 2016-09-09: 4 mg via INTRAVENOUS

## 2016-09-09 MED ORDER — CEFAZOLIN SODIUM-DEXTROSE 2-4 GM/100ML-% IV SOLN
2.0000 g | INTRAVENOUS | Status: AC
  Administered 2016-09-09: 2 g via INTRAVENOUS

## 2016-09-09 MED ORDER — FENTANYL CITRATE (PF) 100 MCG/2ML IJ SOLN
50.0000 ug | INTRAMUSCULAR | Status: DC | PRN
  Administered 2016-09-09 (×2): 50 ug via INTRAVENOUS

## 2016-09-09 MED ORDER — CEFAZOLIN SODIUM-DEXTROSE 2-4 GM/100ML-% IV SOLN
INTRAVENOUS | Status: AC
  Filled 2016-09-09: qty 100

## 2016-09-09 MED ORDER — FENTANYL CITRATE (PF) 100 MCG/2ML IJ SOLN
INTRAMUSCULAR | Status: AC
  Filled 2016-09-09: qty 2

## 2016-09-09 MED ORDER — LIDOCAINE 2% (20 MG/ML) 5 ML SYRINGE
INTRAMUSCULAR | Status: AC
  Filled 2016-09-09: qty 5

## 2016-09-09 MED ORDER — SCOPOLAMINE 1 MG/3DAYS TD PT72: 1.0000 | MEDICATED_PATCH | Freq: Once | TRANSDERMAL | Status: DC | PRN

## 2016-09-09 MED ORDER — BACITRACIN ZINC 500 UNIT/GM EX OINT
TOPICAL_OINTMENT | CUTANEOUS | Status: AC
  Filled 2016-09-09: qty 0.9

## 2016-09-09 MED ORDER — PROPOFOL 10 MG/ML IV BOLUS
INTRAVENOUS | Status: AC
  Filled 2016-09-09: qty 20

## 2016-09-09 MED ORDER — PROPOFOL 500 MG/50ML IV EMUL
INTRAVENOUS | Status: AC
  Filled 2016-09-09: qty 50

## 2016-09-09 MED ORDER — LIDOCAINE HCL 2 % IJ SOLN
INTRAMUSCULAR | Status: AC
  Filled 2016-09-09: qty 20

## 2016-09-09 MED ORDER — LACTATED RINGERS IV SOLN
INTRAVENOUS | Status: DC
  Administered 2016-09-09: 07:00:00 via INTRAVENOUS

## 2016-09-09 MED ORDER — DEXAMETHASONE SODIUM PHOSPHATE 4 MG/ML IJ SOLN
INTRAMUSCULAR | Status: DC | PRN
  Administered 2016-09-09: 10 mg via INTRAVENOUS

## 2016-09-09 MED ORDER — MIDAZOLAM HCL 2 MG/2ML IJ SOLN: 1.0000 mg | INTRAMUSCULAR | Status: DC | PRN

## 2016-09-09 MED ORDER — MIDAZOLAM HCL 2 MG/2ML IJ SOLN
INTRAMUSCULAR | Status: AC
  Filled 2016-09-09: qty 2

## 2016-09-09 MED ORDER — ARTIFICIAL TEARS OP OINT
TOPICAL_OINTMENT | OPHTHALMIC | Status: AC
  Filled 2016-09-09: qty 3.5

## 2016-09-09 MED ORDER — HYDROCODONE-ACETAMINOPHEN 7.5-325 MG/15ML PO SOLN: 10.0000 mL | Freq: Four times a day (QID) | ORAL | 0 refills | Status: DC | PRN

## 2016-09-09 MED ORDER — LIDOCAINE HCL (CARDIAC) 20 MG/ML IV SOLN
INTRAVENOUS | Status: DC | PRN
  Administered 2016-09-09: 100 mg via INTRAVENOUS

## 2016-09-09 MED ORDER — EPHEDRINE 5 MG/ML INJ
INTRAVENOUS | Status: AC
  Filled 2016-09-09: qty 10

## 2016-09-09 MED ORDER — SUCCINYLCHOLINE CHLORIDE 20 MG/ML IJ SOLN
INTRAMUSCULAR | Status: DC | PRN
  Administered 2016-09-09: 100 mg via INTRAVENOUS

## 2016-09-09 MED ORDER — ATROPINE SULFATE 0.4 MG/ML IJ SOLN
INTRAMUSCULAR | Status: AC
  Filled 2016-09-09: qty 1

## 2016-09-09 MED ORDER — FENTANYL CITRATE (PF) 100 MCG/2ML IJ SOLN
25.0000 ug | INTRAMUSCULAR | Status: DC | PRN
  Administered 2016-09-09: 25 ug via INTRAVENOUS

## 2016-09-09 MED ORDER — PROPOFOL 10 MG/ML IV BOLUS
INTRAVENOUS | Status: DC | PRN
  Administered 2016-09-09: 100 mg via INTRAVENOUS
  Administered 2016-09-09: 50 mg via INTRAVENOUS

## 2016-09-09 MED ORDER — LIDOCAINE HCL (PF) 1 % IJ SOLN
INTRAMUSCULAR | Status: AC
  Filled 2016-09-09: qty 30

## 2016-09-09 MED ORDER — ONDANSETRON HCL 4 MG/2ML IJ SOLN: 4.0000 mg | Freq: Once | INTRAMUSCULAR | Status: DC | PRN

## 2016-09-09 MED ORDER — LIDOCAINE-EPINEPHRINE 1 %-1:100000 IJ SOLN
INTRAMUSCULAR | Status: AC
  Filled 2016-09-09: qty 1

## 2016-09-09 MED ORDER — ONDANSETRON HCL 4 MG/2ML IJ SOLN
INTRAMUSCULAR | Status: AC
  Filled 2016-09-09: qty 2

## 2016-09-09 MED ORDER — BUPIVACAINE-EPINEPHRINE (PF) 0.5% -1:200000 IJ SOLN
INTRAMUSCULAR | Status: AC
  Filled 2016-09-09: qty 30

## 2016-09-09 SURGICAL SUPPLY — 22 items
CANISTER SUCT 1200ML W/VALVE (MISCELLANEOUS) ×3 IMPLANT
CLEANER CAUTERY TIP 5X5 PAD (MISCELLANEOUS) IMPLANT
COVER MAYO STAND STRL (DRAPES) ×3 IMPLANT
ELECT COATED BLADE 2.86 ST (ELECTRODE) ×3 IMPLANT
ELECT REM PT RETURN 9FT ADLT (ELECTROSURGICAL) ×3
ELECTRODE REM PT RTRN 9FT ADLT (ELECTROSURGICAL) ×1 IMPLANT
GLOVE SS BIOGEL STRL SZ 7.5 (GLOVE) ×1 IMPLANT
GLOVE SUPERSENSE BIOGEL SZ 7.5 (GLOVE) ×2
GOWN STRL REUS W/ TWL LRG LVL3 (GOWN DISPOSABLE) ×1 IMPLANT
GOWN STRL REUS W/TWL LRG LVL3 (GOWN DISPOSABLE) ×2
MARKER SKIN DUAL TIP RULER LAB (MISCELLANEOUS) IMPLANT
NS IRRIG 1000ML POUR BTL (IV SOLUTION) IMPLANT
PAD CLEANER CAUTERY TIP 5X5 (MISCELLANEOUS)
PENCIL FOOT CONTROL (ELECTRODE) ×3 IMPLANT
SHEET MEDIUM DRAPE 40X70 STRL (DRAPES) ×3 IMPLANT
SPONGE GAUZE 4X4 12PLY STER LF (GAUZE/BANDAGES/DRESSINGS) ×3 IMPLANT
SUT CHROMIC 3 0 SH 27 (SUTURE) IMPLANT
SYR BULB 3OZ (MISCELLANEOUS) ×3 IMPLANT
TOWEL OR 17X24 6PK STRL BLUE (TOWEL DISPOSABLE) ×3 IMPLANT
TUBE CONNECTING 20'X1/4 (TUBING) ×1
TUBE CONNECTING 20X1/4 (TUBING) ×2 IMPLANT
YANKAUER SUCT BULB TIP NO VENT (SUCTIONS) ×3 IMPLANT

## 2016-09-09 NOTE — Anesthesia Preprocedure Evaluation (Addendum)
Anesthesia Evaluation  Patient identified by MRN, date of birth, ID band Patient awake    Reviewed: Allergy & Precautions, NPO status , Patient's Chart, lab work & pertinent test results  History of Anesthesia Complications Negative for: history of anesthetic complications  Airway Mallampati: II  TM Distance: >3 FB Neck ROM: Full    Dental  (+) Teeth Intact, Dental Advisory Given   Pulmonary COPD, Current Smoker (0.5 PPD),    Pulmonary exam normal breath sounds clear to auscultation       Cardiovascular hypertension, Normal cardiovascular exam Rhythm:Regular Rate:Normal     Neuro/Psych PSYCHIATRIC DISORDERS Anxiety negative neurological ROS     GI/Hepatic Neg liver ROS, GERD  ,  Endo/Other  negative endocrine ROS  Renal/GU negative Renal ROS     Musculoskeletal negative musculoskeletal ROS (+)   Abdominal   Peds  Hematology negative hematology ROS (+)   Anesthesia Other Findings Day of surgery medications reviewed with the patient.  Reproductive/Obstetrics negative OB ROS                            Anesthesia Physical Anesthesia Plan  ASA: II  Anesthesia Plan: General   Post-op Pain Management:    Induction: Intravenous  Airway Management Planned: Oral ETT  Additional Equipment:   Intra-op Plan:   Post-operative Plan: Extubation in OR  Informed Consent: I have reviewed the patients History and Physical, chart, labs and discussed the procedure including the risks, benefits and alternatives for the proposed anesthesia with the patient or authorized representative who has indicated his/her understanding and acceptance.   Dental advisory given  Plan Discussed with: CRNA  Anesthesia Plan Comments: (Risks/benefits of general anesthesia discussed with patient including risk of damage to teeth, lips, gum, and tongue, nausea/vomiting, allergic reactions to medications, and the  possibility of heart attack, stroke and death.  All patient questions answered.  Patient wishes to proceed.)        Anesthesia Quick Evaluation

## 2016-09-09 NOTE — Anesthesia Postprocedure Evaluation (Signed)
Anesthesia Post Note  Patient: MAELENE BANKEY  Procedure(s) Performed: Procedure(s) (LRB): PARTIAL UVULECTOMY, direct laryngoscopy (N/A)  Patient location during evaluation: PACU Anesthesia Type: General Level of consciousness: awake and alert Pain management: pain level controlled Vital Signs Assessment: post-procedure vital signs reviewed and stable Respiratory status: spontaneous breathing, nonlabored ventilation, respiratory function stable and patient connected to nasal cannula oxygen Cardiovascular status: blood pressure returned to baseline and stable Postop Assessment: no signs of nausea or vomiting Anesthetic complications: no    Last Vitals:  Vitals:   09/09/16 0900 09/09/16 0919  BP: 136/79 137/65  Pulse: 87 83  Resp: 19 16  Temp:  36.2 C    Last Pain:  Vitals:   09/09/16 0919  TempSrc:   PainSc: [place] Edward Turk

## 2016-09-09 NOTE — Anesthesia Procedure Notes (Signed)
Procedure Name: Intubation Date/Time: 09/09/2016 7:40 AM Performed by: Melynda Ripple D Pre-anesthesia Checklist: Patient identified, Emergency Drugs available, Suction available and Patient being monitored Patient Re-evaluated:Patient Re-evaluated prior to inductionOxygen Delivery Method: Circle system utilized Preoxygenation: Pre-oxygenation with 100% oxygen Intubation Type: IV induction Ventilation: Mask ventilation without difficulty Laryngoscope Size: Mac and 3 Grade View: Grade II Tube type: Oral Tube size: 6.5 mm Number of attempts: 2 Airway Equipment and Method: Stylet,  Oral airway and Video-laryngoscopy Placement Confirmation: ETT inserted through vocal cords under direct vision,  positive ETCO2 and breath sounds checked- equal and bilateral Secured at: 23 cm Tube secured with: Tape Dental Injury: Teeth and Oropharynx as per pre-operative assessment  Difficulty Due To: Difficulty was unanticipated, Difficult Airway- due to anterior larynx and Difficult Airway- due to dentition

## 2016-09-09 NOTE — Op Note (Signed)
NAMETENNLEY, SALDIERNA                ACCOUNT NO.:  [id number]  MEDICAL RECORD NO.:  [id number]  LOCATION:                                 FACILITY:  PHYSICIAN:  Leonides Sake. Lucia Gaskins, M.D.DATE OF BIRTH:  [birth year]  DATE OF PROCEDURE:  09/09/2016 DATE OF DISCHARGE:                              OPERATIVE REPORT   PREOPERATIVE DIAGNOSIS:  Dysphagia with chronic throat complaints and snoring.  POSTOPERATIVE DIAGNOSIS:  Dysphagia with chronic throat complaints and snoring, elongated uvula.  OPERATIONS PERFORMED: 1. Direct laryngoscopy. 2. Uvulectomy.  SURGEON:  Leonides Sake. Lucia Gaskins, M.D.  ANESTHESIA:  General endotracheal.  COMPLICATIONS:  None.  BRIEF CLINICAL NOTE:  [name] is a 70 year old female who has had chronic throat complaints for several months.  She describes through getting caught in the back of her throat.  She is a smoker and smokes about a half a pack a day, but used to smoke a little bit heavier.  On exam in the office, she is status post tonsillectomy, but has a very elongated uvula and has some prominent base of tongue lingual tonsil tissue.  She is taken to operating room for uvulectomy, but because of difficult intubation, she also underwent a direct laryngoscopy.  DESCRIPTION OF PROCEDURE:  The patient underwent general endotracheal anesthesia.  Anesthesiologist had a little bit difficulty intubating her, he had to use the glide scope.  There was some fullness in the base of tongue area.  She received 2 g Ancef IV preoperatively as well as 10 mg of Decadron.  Direct laryngoscopy was performed.  On direct laryngoscopy, she had prominent lingual tonsil tissue, but no ulcerations or discrete masses noted.  On palpation of base of tongue, it was relatively soft.  Epiglottis, vocal cords, false cords were clear otherwise.  Piriform sinuses were clear.  Next, a mouth gag was used to expose the oropharynx in the uvula, was transected at its base using  a cautery.  A small vertical incision was made on either side of the uvula, transecting some of the soft palate for about 6 to 8 mm.  There was no substantial bleeding.  This completed the procedure.  The patient was awoken from anesthesia and transferred to recovery room, postop doing well.  DISPOSITION:  She is discharged home later this morning on Tylenol, Motrin, or hydrocodone elixir p.r.n. pain.  We will have him follow up in my office in 2 weeks for recheck.    ______________________________ Leonides Sake. Lucia Gaskins, M.D.   ______________________________ Leonides Sake. Lucia Gaskins, M.D.    CEN/MEDQ  D:  09/09/2016  T:  09/09/2016  Job:  [id number]  cc:   [name], M.D.

## 2016-09-09 NOTE — Interval H&P Note (Signed)
History and Physical Interval Note:  09/09/2016 7:27 AM  Alison Campbell  has presented today for surgery, with the diagnosis of UVULA   The various methods of treatment have been discussed with the patient and family. After consideration of risks, benefits and other options for treatment, the patient has consented to  Procedure(s) with comments: PARTIAL UVULECTOMY (N/A) - PARTIAL UVULECTOMY as a surgical intervention .  The patient's history has been reviewed, patient examined, no change in status, stable for surgery.  I have reviewed the patient's chart and labs.  Questions were answered to the patient's satisfaction.     Coleman Kalas

## 2016-09-09 NOTE — Discharge Instructions (Signed)
Drink plenty of liquids. And diet as tolerated  Tylenol, motrin or hydrocodone elixir 10-15 cc every 6 hrs prn pain Call office for follow up appt in 10-14 days  Post Anesthesia Home Care Instructions  Activity: Get plenty of rest for the remainder of the day. A responsible adult should stay with you for 24 hours following the procedure.  For the next 24 hours, DO NOT: -Drive a car -Paediatric nurse -Drink alcoholic beverages -Take any medication unless instructed by your physician -Make any legal decisions or sign important papers.  Meals: Start with liquid foods such as gelatin or soup. Progress to regular foods as tolerated. Avoid greasy, spicy, heavy foods. If nausea and/or vomiting occur, drink only clear liquids until the nausea and/or vomiting subsides. Call your physician if vomiting continues.  Special Instructions/Symptoms: Your throat may feel dry or sore from the anesthesia or the breathing tube placed in your throat during surgery. If this causes discomfort, gargle with warm salt water. The discomfort should disappear within 24 hours.  If you had a scopolamine patch placed behind your ear for the management of post- operative nausea and/or vomiting:  1. The medication in the patch is effective for 72 hours, after which it should be removed.  Wrap patch in a tissue and discard in the trash. Wash hands thoroughly with soap and water. 2. You may remove the patch earlier than 72 hours if you experience unpleasant side effects which may include dry mouth, dizziness or visual disturbances. 3. Avoid touching the patch. Wash your hands with soap and water after contact with the patch.

## 2016-09-09 NOTE — Transfer of Care (Signed)
Immediate Anesthesia Transfer of Care Note  Patient: Alison Campbell  Procedure(s) Performed: Procedure(s) with comments: PARTIAL UVULECTOMY, direct laryngoscopy (N/A) - PARTIAL UVULECTOMY, direct laryngoscopy  Patient Location: PACU  Anesthesia Type:General  Level of Consciousness: awake and alert   Airway & Oxygen Therapy: Patient Spontanous Breathing and Patient connected to face mask oxygen  Post-op Assessment: Report given to RN and Post -op Vital signs reviewed and stable  Post vital signs: Reviewed and stable  Last Vitals:  Vitals:   09/09/16 0631  BP: (!) 111/96  Pulse: 84  Resp: 18  Temp: 36.7 C    Last Pain:  Vitals:   09/09/16 0631  TempSrc: Oral         Complications: No apparent anesthesia complications

## 2016-09-10 ENCOUNTER — Encounter (HOSPITAL_BASED_OUTPATIENT_CLINIC_OR_DEPARTMENT_OTHER): Payer: Self-pay | Admitting: Otolaryngology

## 2016-12-12 ENCOUNTER — Ambulatory Visit: Payer: Self-pay | Admitting: Physician Assistant

## 2017-01-13 DIAGNOSIS — H2513 Age-related nuclear cataract, bilateral: Secondary | ICD-10-CM | POA: Diagnosis not present

## 2017-03-22 NOTE — Progress Notes (Addendum)
[facility] [name], M.D.    Uvaldo Bristle. [name], P.A.-C      Starlyn Skeans, P.A.-C  [facility]                [address] [place] [address] Telephone [phone] Telefax [phone]  Annual Screening/Preventative Visit & Comprehensive Evaluation &  Examination     This very nice 71 y.o.  [facility] presents for a Screening/Preventative Visit & comprehensive evaluation and management of multiple medical co-morbidities.  Patient has been followed for Labile HTN, Prediabetes, Hyperlipidemia and Vitamin D Deficiency. Patient is treated for Osteoporosis. Also, patient has COPD. Patient has a near 44 yr history of smoking 1 ppd. Patient has nicotine addiction and is asymptomatic.  Patient is counseled on importance of smoking cessation and she is agreeable and shares decision making for Low Dose screening CT Lung Scan.       Patient has labile HTN monitored expectantly for years. Patient's BP has been controlled at home and patient denies any cardiac symptoms as chest pain, palpitations, shortness of breath, dizziness or ankle swelling. Today's BP is at goal - 122/80.      Patient's hyperlipidemia is controlled with diet and medications. Patient denies myalgias or other medication SE's. Last lipids were at goal albeit elevated Trig's: Lab Results  Component Value Date   CHOL 164 08/25/2016   HDL 41 (L) 08/25/2016   LDLCALC 79 08/25/2016   TRIG 220 (H) 08/25/2016   CHOLHDL 4.0 08/25/2016      Patient has prediabetes (A1c 6.0% in 2011)  and patient denies reactive hypoglycemic symptoms, visual blurring, diabetic polys, or paresthesias. Last A1c was at goal:  Lab Results  Component Value Date   HGBA1C 5.3 08/25/2016      Finally, patient has history of Vitamin D Deficiency ("35" in 2008) and last Vitamin D was at goal: Lab Results  Component Value Date   VD25OH 74 08/25/2016   Current  Outpatient Prescriptions on File Prior to Visit  Medication Sig  . alendronate  70 MG  Take 1 tablet (70 mg total) by mouth once a week.   . ALPRAZolam  1 MG  1/2-1 tab 2 to 3 x daily as needed   . aspirin 81 MG EC  Take 1 tablet daily  . atorvastatin  80 MG  Take 1/2-1 tab daily   . CALCIUM  600 mg  Take 2 tab daily  . VITAMIN D Take 2,000 Units  daily.   . Multiple Vitamin  Take 2 tab daily.     Allergies  Allergen Reactions  . Fish Oil Diarrhea   Past Medical History:  Diagnosis Date  . Anxiety   . Colon polyps   . GERD (gastroesophageal reflux disease)    coffee related  . Hyperlipidemia   . Insomnia   . Kidney stones   . Osteoporosis   . Pre-diabetes    Health Maintenance  Topic Date Due  . Hepatitis C Screening  [birth year]  . COLONOSCOPY  09/09/2014  . INFLUENZA VACCINE  07/29/2016  . MAMMOGRAM  02/17/2018  . TETANUS/TDAP  02/27/2019  . DEXA SCAN  Completed  . PNA vac Low Risk Adult  Completed   Immunization History  Administered Date(s) Administered  . Influenza, High Dose Seasonal PF 08/29/2013, 10/25/2015  . Pneumococcal Conjugate-13 02/02/2015  . Pneumococcal Polysaccharide-23 03/29/2010  . Tdap 02/26/2009  .  Zoster 12/29/2008   Past Surgical History:  Procedure Laterality Date  . ABDOMINAL HYSTERECTOMY    . COLONOSCOPY    . hysterectomy    . POLYPECTOMY    . UVULECTOMY N/A 09/09/2016   Procedure: PARTIAL UVULECTOMY, direct laryngoscopy;  Surgeon: Rozetta Nunnery, MD;  Location: [facility];  Service: ENT;  Laterality: N/A;  PARTIAL UVULECTOMY, direct laryngoscopy    Social History  Substance Use Topics  . Smoking status: Current Every Day Smoker    Packs/day: 0.50    Years: 32.00  . Smokeless tobacco: Never Used  . Alcohol use No    ROS Constitutional: Denies fever, chills, weight loss/gain, headaches, insomnia,  night sweats, and change in appetite. Does c/o fatigue. Eyes: Denies redness, blurred vision, diplopia,  discharge, itchy, watery eyes.  ENT: Denies discharge, congestion, post nasal drip, epistaxis, sore throat, earache, hearing loss, dental pain, Tinnitus, Vertigo, Sinus pain, snoring.  Cardio: Denies chest pain, palpitations, irregular heartbeat, syncope, dyspnea, diaphoresis, orthopnea, PND, claudication, edema Respiratory: denies cough, dyspnea, DOE, pleurisy, hoarseness, laryngitis, wheezing.  Gastrointestinal: Denies dysphagia, heartburn, reflux, water brash, pain, cramps, nausea, vomiting, bloating, diarrhea, constipation, hematemesis, melena, hematochezia, jaundice, hemorrhoids Genitourinary: Denies dysuria, frequency, urgency, nocturia, hesitancy, discharge, hematuria, flank pain Breast: Breast lumps, nipple discharge, bleeding.  Musculoskeletal: Denies arthralgia, myalgia, stiffness, Jt. Swelling, pain, limp, and strain/sprain. Denies falls. Skin: Denies puritis, rash, hives, warts, acne, eczema, changing in skin lesion Neuro: No weakness, tremor, incoordination, spasms, paresthesia, pain Psychiatric: Denies confusion, memory loss, sensory loss. Denies Depression. Endocrine: Denies change in weight, skin, hair change, nocturia, and paresthesia, diabetic polys, visual blurring, hyper / hypo glycemic episodes.  Heme/Lymph: No excessive bleeding, bruising, enlarged lymph nodes.  Physical Exam  BP 122/80   Pulse 76   Temp 97.5 F (36.4 C)   Resp 16   Ht 5' 3.5" (1.613 m)   Wt 146 lb 6.4 oz (66.4 kg)   BMI 25.53 kg/m   General Appearance: Well nourished and in no apparent distress.  Eyes: PERRLA, EOMs, conjunctiva no swelling or erythema, normal fundi and vessels. Sinuses: No frontal/maxillary tenderness ENT/Mouth: EACs patent / TMs  nl. Nares clear without erythema, swelling, mucoid exudates. Oral hygiene is good. No erythema, swelling, or exudate. Tongue normal, non-obstructing. Tonsils not swollen or erythematous. Hearing normal.  Neck: Supple, thyroid normal. No bruits, nodes or  JVD. Respiratory: Respiratory effort normal.  BS equal and clear bilateral without rales, rhonci, wheezing or stridor. Cardio: Heart sounds are normal with regular rate and rhythm and no murmurs, rubs or gallops. Peripheral pulses are normal and equal bilaterally without edema. No aortic or femoral bruits. Chest: symmetric with normal excursions and percussion. Breasts: Symmetric, without lumps, nipple discharge, retractions, or fibrocystic changes.  Abdomen: Flat, soft with bowel sounds active. Nontender, no guarding, rebound, hernias, masses, or organomegaly.  Lymphatics: Non tender without lymphadenopathy.  Genitourinary:  Musculoskeletal: Full ROM all peripheral extremities, joint stability, 5/5 strength, and normal gait. Skin: Warm and dry without rashes, lesions, cyanosis, clubbing or  ecchymosis.  Neuro: Cranial nerves intact, reflexes equal bilaterally. Normal muscle tone, no cerebellar symptoms. Sensation intact.  Pysch: Alert and oriented X 3, normal affect, Insight and Judgment appropriate.   Assessment and Plan  1. Annual Preventative Screening Examination  2. Essential hypertension  - EKG 12-Lead - Urinalysis, Routine w reflex microscopic - Microalbumin / creatinine urine ratio - CBC with Differential/Platelet - BASIC METABOLIC PANEL WITH GFR - Hepatic function panel - Magnesium - TSH  3. Mixed  hyperlipidemia  - EKG 12-Lead - Lipid panel - TSH  4. Prediabetes  - EKG 12-Lead - Hemoglobin A1c - Insulin, random  5. Vitamin D deficiency  - VITAMIN D 25 Hydroxy  6. 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 Screening for rectal cancer  - POC Hemoccult Bld/Stl   8. Screening for ischemic heart disease  - EKG 12-Lead  9. Palpitations  - EKG 12-Lead  10. Tobacco use disorder  - EKG 12-Lead  11. Medication management  - Urinalysis, Routine w reflex microscopic - CBC with Differential/Platelet - BASIC METABOLIC PANEL WITH GFR - Hepatic function  panel - Magnesium - Lipid panel - TSH - Hemoglobin A1c - Insulin, random - VITAMIN D 25 Hydroxy        Continue prudent diet as discussed, weight control, BP monitoring, regular exercise, and medications. Discussed med's effects and SE's. Screening labs and tests as requested with regular follow-up as recommended. Over 40 minutes of exam, counseling, chart review and high complex critical decision making was performed.

## 2017-03-22 NOTE — Patient Instructions (Signed)

## 2017-03-23 ENCOUNTER — Ambulatory Visit (INDEPENDENT_AMBULATORY_CARE_PROVIDER_SITE_OTHER): Payer: Medicare Other | Admitting: Internal Medicine

## 2017-03-23 ENCOUNTER — Encounter: Payer: Self-pay | Admitting: Internal Medicine

## 2017-03-23 VITALS — BP 122/80 | HR 76 | Temp 97.5°F | Resp 16 | Ht 63.5 in | Wt 146.4 lb

## 2017-03-23 DIAGNOSIS — J4489 Other specified chronic obstructive pulmonary disease: Secondary | ICD-10-CM

## 2017-03-23 DIAGNOSIS — E782 Mixed hyperlipidemia: Secondary | ICD-10-CM | POA: Diagnosis not present

## 2017-03-23 DIAGNOSIS — Z0001 Encounter for general adult medical examination with abnormal findings: Secondary | ICD-10-CM

## 2017-03-23 DIAGNOSIS — E559 Vitamin D deficiency, unspecified: Secondary | ICD-10-CM

## 2017-03-23 DIAGNOSIS — I10 Essential (primary) hypertension: Secondary | ICD-10-CM | POA: Diagnosis not present

## 2017-03-23 DIAGNOSIS — Z79899 Other long term (current) drug therapy: Secondary | ICD-10-CM

## 2017-03-23 DIAGNOSIS — Z136 Encounter for screening for cardiovascular disorders: Secondary | ICD-10-CM

## 2017-03-23 DIAGNOSIS — M810 Age-related osteoporosis without current pathological fracture: Secondary | ICD-10-CM

## 2017-03-23 DIAGNOSIS — J449 Chronic obstructive pulmonary disease, unspecified: Secondary | ICD-10-CM | POA: Diagnosis not present

## 2017-03-23 DIAGNOSIS — R7303 Prediabetes: Secondary | ICD-10-CM

## 2017-03-23 DIAGNOSIS — F17210 Nicotine dependence, cigarettes, uncomplicated: Secondary | ICD-10-CM

## 2017-03-23 DIAGNOSIS — Z1212 Encounter for screening for malignant neoplasm of rectum: Secondary | ICD-10-CM

## 2017-03-23 DIAGNOSIS — R002 Palpitations: Secondary | ICD-10-CM | POA: Diagnosis not present

## 2017-03-23 DIAGNOSIS — F17200 Nicotine dependence, unspecified, uncomplicated: Secondary | ICD-10-CM

## 2017-03-23 LAB — BASIC METABOLIC PANEL WITH GFR
BUN: 22 mg/dL (ref 7–25)
CO2: 24 mmol/L (ref 20–31)
CREATININE: 1.18 mg/dL — AB (ref 0.60–0.93)
Calcium: 9.4 mg/dL (ref 8.6–10.4)
Chloride: 110 mmol/L (ref 98–110)
GFR, EST AFRICAN AMERICAN: 53 mL/min — AB (ref >=60)
GFR, Est Non African American: 46 mL/min — ABNORMAL LOW (ref >=60)
GLUCOSE: 79 mg/dL (ref 65–99)
Potassium: 4.1 mmol/L (ref 3.5–5.3)
Sodium: 143 mmol/L (ref 135–146)

## 2017-03-23 LAB — LIPID PANEL
Cholesterol: 174 mg/dL (ref <200)
HDL: 40 mg/dL — AB (ref >50)
LDL Cholesterol: 90 mg/dL (ref <100)
TRIGLYCERIDES: 220 mg/dL — AB (ref <150)
Total CHOL/HDL Ratio: 4.4 Ratio (ref <5.0)
VLDL: 44 mg/dL — ABNORMAL HIGH (ref <30)

## 2017-03-23 LAB — CBC WITH DIFFERENTIAL/PLATELET
BASOS ABS: 0 {cells}/uL (ref 0–200)
Basophils Relative: 0 %
EOS ABS: 344 {cells}/uL (ref 15–500)
Eosinophils Relative: 4 %
HCT: 43 % (ref 35.0–45.0)
Hemoglobin: 14 g/dL (ref 11.7–15.5)
LYMPHS PCT: 27 %
Lymphs Abs: 2322 cells/uL (ref 850–3900)
MCH: 30.4 pg (ref 27.0–33.0)
MCHC: 32.6 g/dL (ref 32.0–36.0)
MCV: 93.5 fL (ref 80.0–100.0)
MONOS PCT: 6 %
MPV: 11.1 fL (ref 7.5–12.5)
Monocytes Absolute: 516 cells/uL (ref 200–950)
NEUTROS PCT: 63 %
Neutro Abs: 5418 cells/uL (ref 1500–7800)
PLATELETS: 299 10*3/uL (ref 140–400)
RBC: 4.6 MIL/uL (ref 3.80–5.10)
RDW: 13.5 % (ref 11.0–15.0)
WBC: 8.6 10*3/uL (ref 3.8–10.8)

## 2017-03-23 LAB — HEPATIC FUNCTION PANEL
ALBUMIN: 4 g/dL (ref 3.6–5.1)
ALT: 18 U/L (ref 6–29)
AST: 16 U/L (ref 10–35)
Alkaline Phosphatase: 62 U/L (ref 33–130)
BILIRUBIN TOTAL: 0.3 mg/dL (ref 0.2–1.2)
Bilirubin, Direct: 0.1 mg/dL (ref <=0.2)
Indirect Bilirubin: 0.2 mg/dL (ref 0.2–1.2)
TOTAL PROTEIN: 6.9 g/dL (ref 6.1–8.1)

## 2017-03-23 LAB — TSH: TSH: 0.91 m[IU]/L

## 2017-03-23 MED ORDER — ALENDRONATE SODIUM 70 MG PO TABS: ORAL_TABLET | ORAL | 3 refills | Status: DC

## 2017-03-23 NOTE — Addendum Note (Signed)
Addended by: [name] on: 03/23/2017 06:57 PM   Modules accepted: Orders

## 2017-03-24 LAB — URINALYSIS, MICROSCOPIC ONLY
Bacteria, UA: NONE SEEN [HPF]
CASTS: NONE SEEN [LPF]
YEAST: NONE SEEN [HPF]

## 2017-03-24 LAB — URINALYSIS, ROUTINE W REFLEX MICROSCOPIC
Bilirubin Urine: NEGATIVE
Glucose, UA: NEGATIVE
HGB URINE DIPSTICK: NEGATIVE
KETONES UR: NEGATIVE
Leukocytes, UA: NEGATIVE
NITRITE: NEGATIVE
PROTEIN: NEGATIVE
Specific Gravity, Urine: 1.026 (ref 1.001–1.035)
pH: 5 (ref 5.0–8.0)

## 2017-03-24 LAB — VITAMIN D 25 HYDROXY (VIT D DEFICIENCY, FRACTURES): VIT D 25 HYDROXY: 71 ng/mL (ref 30–100)

## 2017-03-24 LAB — MAGNESIUM: MAGNESIUM: 2 mg/dL (ref 1.5–2.5)

## 2017-03-24 LAB — MICROALBUMIN / CREATININE URINE RATIO
CREATININE, URINE: 213 mg/dL (ref 20–320)
MICROALB UR: 0.9 mg/dL
Microalb Creat Ratio: 4 mcg/mg creat (ref <30)

## 2017-03-24 LAB — HEMOGLOBIN A1C
Hgb A1c MFr Bld: 5.2 % (ref <5.7)
MEAN PLASMA GLUCOSE: 103 mg/dL

## 2017-03-24 LAB — INSULIN, RANDOM: INSULIN: 10.6 u[IU]/mL (ref 2.0–19.6)

## 2017-04-21 ENCOUNTER — Other Ambulatory Visit: Payer: Self-pay | Admitting: *Deleted

## 2017-04-21 DIAGNOSIS — Z1212 Encounter for screening for malignant neoplasm of rectum: Secondary | ICD-10-CM

## 2017-04-21 LAB — POC HEMOCCULT BLD/STL (HOME/3-CARD/SCREEN)
Card #2 Fecal Occult Blod, POC: NEGATIVE
FECAL OCCULT BLD: NEGATIVE
Fecal Occult Blood, POC: NEGATIVE

## 2017-04-23 ENCOUNTER — Other Ambulatory Visit: Payer: Self-pay | Admitting: Internal Medicine

## 2017-04-23 DIAGNOSIS — F17219 Nicotine dependence, cigarettes, with unspecified nicotine-induced disorders: Secondary | ICD-10-CM

## 2017-04-23 DIAGNOSIS — F17200 Nicotine dependence, unspecified, uncomplicated: Secondary | ICD-10-CM

## 2017-04-24 ENCOUNTER — Other Ambulatory Visit: Payer: Self-pay | Admitting: Orthopedic Surgery

## 2017-04-24 DIAGNOSIS — Z1231 Encounter for screening mammogram for malignant neoplasm of breast: Secondary | ICD-10-CM

## 2017-04-29 NOTE — Addendum Note (Signed)
Addended by: [name] on: 04/29/2017 08:23 PM   Modules accepted: Orders

## 2017-05-08 ENCOUNTER — Ambulatory Visit
Admission: RE | Admit: 2017-05-08 | Discharge: 2017-05-08 | Disposition: A | Discharge status: Home / Self Care | Payer: Medicare Other | Source: Ambulatory Visit | Attending: Internal Medicine | Admitting: Internal Medicine

## 2017-05-08 DIAGNOSIS — F17210 Nicotine dependence, cigarettes, uncomplicated: Secondary | ICD-10-CM

## 2017-05-15 ENCOUNTER — Ambulatory Visit
Admission: RE | Admit: 2017-05-15 | Discharge: 2017-05-15 | Disposition: A | Discharge status: Home / Self Care | Payer: Medicare Other | Source: Ambulatory Visit | Attending: Orthopedic Surgery | Admitting: Orthopedic Surgery

## 2017-05-15 DIAGNOSIS — Z1231 Encounter for screening mammogram for malignant neoplasm of breast: Secondary | ICD-10-CM | POA: Diagnosis not present

## 2017-07-16 ENCOUNTER — Ambulatory Visit: Payer: Self-pay | Admitting: Physician Assistant

## 2017-10-16 ENCOUNTER — Ambulatory Visit: Payer: Self-pay | Admitting: Internal Medicine

## 2017-11-23 NOTE — Progress Notes (Signed)
Assessment and Plan:  Alison Campbell was seen today for abdominal pain.  Diagnoses and all orders for this visit:  Right lower quadrant abdominal pain Diverticulitis vs appendicitis - pain is improving, will treat constipation and with abx - patient to call tomorrow morning with progress, may obtain imaging if doesn't continue to improve.  -     CBC with Differential/Platelet -     BASIC METABOLIC PANEL WITH GFR -     Hepatic function panel -     Urinalysis, Routine w reflex microscopic -     ciprofloxacin (CIPRO) 500 MG tablet; Take 1 tablet (500 mg total) by mouth 2 (two) times daily for 7 days. -     metroNIDAZOLE (FLAGYL) 500 MG tablet; Take 1 tablet (500 mg total) by mouth 3 (three) times daily for 7 days.  Constipation, unspecified constipation type -     lactulose (CHRONULAC) 10 GM/15ML solution; Take 30 mLs (20 g total) by mouth 2 (two) times daily.  Need for vaccination -     Flu vaccine HIGH DOSE PF  Please go to the ER if you have any severe AB pain, unable to hold down food/water, blood in stool or vomit, chest pain, shortness of breath, or any worsening symptoms.   Further disposition pending results of labs. Discussed med's effects and SE's.   Over 30 minutes of exam, counseling, chart review, and critical decision making was performed.   Future Appointments  Date Time Provider [place]  04/19/2018 10:00 AM [name], MD [facility] None    ------------------------------------------------------------------------------------------------------------------   HPI BP 122/78   Pulse 91   Temp (!) 97.3 F (36.3 C)   Ht 5' 3.5" (1.613 m)   Wt 143 lb 9.6 oz (65.1 kg)   SpO2 97%   BMI 25.04 kg/m    71 y.o.female  She reports fever 101.5 2 days ago, fever, chills, constant lower abdominal pain -achy 4/10 if not moving, sharp 7/10 with movement, dizziness. She reports fever was up again the next night, but has resolved. She reports she is somewhat constipated at  baseline and noted she had to strain more than usual last few days, reports she had a more normal BM this morning followed by small amount of diarrhea; still having pain but seems to be improving. She denies N/V, SOB/CP, endorses mild intermittent HA, denies rashes or sick contacts. She denies dysuria/changes in urine character. Denies hematochezia/melena.   She reports she took 400 mg of ibuprofen the first nigh which did help with the pain. Denies anything that makes the pain worse. She reports + history of single episode of diverticulitis over 15 years ago. She has had a total hysterectomy 1972.   No abdominal imaging available in chart for review; she is overdue for colonoscopy after + polyps noted 2012.   Past Medical History:  Diagnosis Date  . Anxiety   . Colon polyps   . GERD (gastroesophageal reflux disease)    coffee related  . Hyperlipidemia   . Insomnia   . Kidney stones   . Osteoporosis   . Pre-diabetes      Allergies  Allergen Reactions  . Fish Oil Diarrhea    Current Outpatient Medications on File Prior to Visit  Medication Sig  . ALPRAZolam (XANAX) 1 MG tablet 1/2 to 1 tablet 2 to 3 x daily as needed for anxiety or sleep  . aspirin 81 MG EC tablet Take 1 tablet daily  . CALCIUM PO Take 2 tablets by mouth daily. [place]  mg  2 per day  . Cholecalciferol (VITAMIN D PO) Take 2,000 Units by mouth daily. Take 5 caps to = 10,000 units daily.  . Multiple Vitamin (MULTIVITAMIN PO) Take 1 tablet by mouth daily.    No current facility-administered medications on file prior to visit.     ROS: all negative except above.   Physical Exam:  BP 122/78   Pulse 91   Temp (!) 97.3 F (36.3 C)   Ht 5' 3.5" (1.613 m)   Wt 143 lb 9.6 oz (65.1 kg)   SpO2 97%   BMI 25.04 kg/m   General Appearance: Well nourished, in no acute distress. Neck: Supple.  Respiratory: Respiratory effort normal, BS equal bilaterally without rales, rhonchi, wheezing or stridor.  Cardio: RRR with no  MRGs. Brisk peripheral pulses without edema.  Abdomen: Soft, + BS.  + tenderness and guarding to RLQ no rebound, palpable hernias or masses. Lymphatics: Non tender without lymphadenopathy.  Musculoskeletal: Full ROM, 5/5 strength, normal gait.  Skin: Warm, dry without rashes, lesions, ecchymosis.  Neuro: Cranial nerves intact. Normal muscle tone, no cerebellar symptoms. Sensation intact.  Psych: Awake and oriented X 3, normal affect, Insight and Judgment appropriate.    Izora Ribas, NP 4:54 PM [facility]

## 2017-11-24 ENCOUNTER — Ambulatory Visit (INDEPENDENT_AMBULATORY_CARE_PROVIDER_SITE_OTHER): Payer: Medicare Other | Admitting: Adult Health

## 2017-11-24 ENCOUNTER — Other Ambulatory Visit: Payer: Self-pay | Admitting: Adult Health

## 2017-11-24 ENCOUNTER — Encounter: Payer: Self-pay | Admitting: Adult Health

## 2017-11-24 VITALS — BP 122/78 | HR 91 | Temp 97.3°F | Ht 63.5 in | Wt 143.6 lb

## 2017-11-24 DIAGNOSIS — M810 Age-related osteoporosis without current pathological fracture: Secondary | ICD-10-CM | POA: Diagnosis not present

## 2017-11-24 DIAGNOSIS — R1031 Right lower quadrant pain: Secondary | ICD-10-CM | POA: Diagnosis not present

## 2017-11-24 DIAGNOSIS — E782 Mixed hyperlipidemia: Secondary | ICD-10-CM

## 2017-11-24 DIAGNOSIS — K5900 Constipation, unspecified: Secondary | ICD-10-CM

## 2017-11-24 DIAGNOSIS — Z23 Encounter for immunization: Secondary | ICD-10-CM | POA: Diagnosis not present

## 2017-11-24 MED ORDER — ATORVASTATIN CALCIUM 80 MG PO TABS: ORAL_TABLET | ORAL | 5 refills | Status: DC

## 2017-11-24 MED ORDER — METRONIDAZOLE 500 MG PO TABS: 500.0000 mg | ORAL_TABLET | Freq: Three times a day (TID) | ORAL | 0 refills | Status: AC

## 2017-11-24 MED ORDER — LACTULOSE 10 GM/15ML PO SOLN: 20.0000 g | Freq: Two times a day (BID) | ORAL | 0 refills | Status: DC

## 2017-11-24 MED ORDER — ALENDRONATE SODIUM 70 MG PO TABS: ORAL_TABLET | ORAL | 3 refills | Status: DC

## 2017-11-24 MED ORDER — CIPROFLOXACIN HCL 500 MG PO TABS: 500.0000 mg | ORAL_TABLET | Freq: Two times a day (BID) | ORAL | 0 refills | Status: AC

## 2017-11-24 NOTE — Patient Instructions (Signed)
Can try the lactulose for constipation Start out with 10mg  or 15 ml once a day, can go up to 30 ml twice a day as needed for constipation Please also make sure you are drinking a lot of water, up to 64-80 oz a day Try to increase your fiber such as whole graines and veggies You can also add a fiber supplement like Citracel or Benefiber, these do not cause gas and bloating and are safe to use.   Walking or moving 20 mins a day can helps as well  If you have severe AB pain, no BM for 3-4 days, or blood in your stool let us know.   Please go to the ER if you have any severe AB pain, unable to hold down food/water, blood in stool or vomit, chest pain, shortness of breath, or any worsening symptoms.     Diverticulitis Diverticulitis is inflammation or infection of small pouches in your colon that form when you have a condition called diverticulosis. The pouches in your colon are called diverticula. Your colon, or large intestine, is where water is absorbed and stool is formed. Complications of diverticulitis can include:  Bleeding.  Severe infection.  Severe pain.  Perforation of your colon.  Obstruction of your colon.  What are the causes? Diverticulitis is caused by bacteria. Diverticulitis happens when stool becomes trapped in diverticula. This allows bacteria to grow in the diverticula, which can lead to inflammation and infection. What increases the risk? People with diverticulosis are at risk for diverticulitis. Eating a diet that does not include enough fiber from fruits and vegetables may make diverticulitis more likely to develop. What are the signs or symptoms? Symptoms of diverticulitis may include:  Abdominal pain and tenderness. The pain is normally located on the left side of the abdomen, but may occur in other areas.  Fever and chills.  Bloating.  Cramping.  Nausea.  Vomiting.  Constipation.  Diarrhea.  Blood in your stool.  How is this diagnosed? Your  health care provider will ask you about your medical history and do a physical exam. You may need to have tests done because many medical conditions can cause the same symptoms as diverticulitis. Tests may include:  Blood tests.  Urine tests.  Imaging tests of the abdomen, including X-rays and CT scans.  When your condition is under control, your health care provider may recommend that you have a colonoscopy. A colonoscopy can show how severe your diverticula are and whether something else is causing your symptoms. How is this treated? Most cases of diverticulitis are mild and can be treated at home. Treatment may include:  Taking over-the-counter pain medicines.  Following a clear liquid diet.  Taking antibiotic medicines by mouth for 7-10 days.  More severe cases may be treated at a hospital. Treatment may include:  Not eating or drinking.  Taking prescription pain medicine.  Receiving antibiotic medicines through an IV tube.  Receiving fluids and nutrition through an IV tube.  Surgery.  Follow these instructions at home:  Follow your health care provider's instructions carefully.  Follow a full liquid diet or other diet as directed by your health care provider. After your symptoms improve, your health care provider may tell you to change your diet. He or she may recommend you eat a high-fiber diet. Fruits and vegetables are good sources of fiber. Fiber makes it easier to pass stool.  Take fiber supplements or probiotics as directed by your health care provider.  Only take medicines as  directed by your health care provider.  Keep all your follow-up appointments. Contact a health care provider if:  Your pain does not improve.  You have a hard time eating food.  Your bowel movements do not return to normal. Get help right away if:  Your pain becomes worse.  Your symptoms do not get better.  Your symptoms suddenly get worse.  You have a fever.  You have  repeated vomiting.  You have bloody or black, tarry stools. This information is not intended to replace advice given to you by your health care provider. Make sure you discuss any questions you have with your health care provider. Document Released: 09/24/2005 Document Revised: 05/22/2016 Document Reviewed: 11/09/2013 Elsevier Interactive Patient Education  2017 [employer].

## 2017-11-25 LAB — URINALYSIS, ROUTINE W REFLEX MICROSCOPIC
BILIRUBIN URINE: NEGATIVE
Bacteria, UA: NONE SEEN /HPF
GLUCOSE, UA: NEGATIVE
Hyaline Cast: NONE SEEN /LPF
NITRITE: NEGATIVE
Specific Gravity, Urine: 1.026 (ref 1.001–1.03)
pH: <=5 (ref 5.0–8.0)

## 2017-11-25 LAB — CBC WITH DIFFERENTIAL/PLATELET
BASOS ABS: 51 {cells}/uL (ref 0–200)
Basophils Relative: 0.3 %
EOS ABS: 255 {cells}/uL (ref 15–500)
Eosinophils Relative: 1.5 %
HEMATOCRIT: 38 % (ref 35.0–45.0)
HEMOGLOBIN: 12.8 g/dL (ref 11.7–15.5)
LYMPHS ABS: 1802 {cells}/uL (ref 850–3900)
MCH: 30.4 pg (ref 27.0–33.0)
MCHC: 33.7 g/dL (ref 32.0–36.0)
MCV: 90.3 fL (ref 80.0–100.0)
MONOS PCT: 6.2 %
MPV: 11.7 fL (ref 7.5–12.5)
NEUTROS PCT: 81.4 %
Neutro Abs: 13838 cells/uL — ABNORMAL HIGH (ref 1500–7800)
PLATELETS: 255 10*3/uL (ref 140–400)
RBC: 4.21 10*6/uL (ref 3.80–5.10)
RDW: 12.3 % (ref 11.0–15.0)
Total Lymphocyte: 10.6 %
WBC mixed population: 1054 cells/uL — ABNORMAL HIGH (ref 200–950)
WBC: 17 10*3/uL — ABNORMAL HIGH (ref 3.8–10.8)

## 2017-11-25 LAB — HEPATIC FUNCTION PANEL
AG RATIO: 1.3 (calc) (ref 1.0–2.5)
ALBUMIN MSPROF: 3.9 g/dL (ref 3.6–5.1)
ALKALINE PHOSPHATASE (APISO): 67 U/L (ref 33–130)
ALT: 14 U/L (ref 6–29)
AST: 13 U/L (ref 10–35)
BILIRUBIN TOTAL: 0.5 mg/dL (ref 0.2–1.2)
Bilirubin, Direct: 0.1 mg/dL (ref 0.0–0.2)
Globulin: 2.9 g/dL (calc) (ref 1.9–3.7)
Indirect Bilirubin: 0.4 mg/dL (calc) (ref 0.2–1.2)
TOTAL PROTEIN: 6.8 g/dL (ref 6.1–8.1)

## 2017-11-25 LAB — BASIC METABOLIC PANEL WITH GFR
BUN / CREAT RATIO: 15 (calc) (ref 6–22)
BUN: 18 mg/dL (ref 7–25)
CO2: 24 mmol/L (ref 20–32)
CREATININE: 1.18 mg/dL — AB (ref 0.60–0.93)
Calcium: 9.2 mg/dL (ref 8.6–10.4)
Chloride: 104 mmol/L (ref 98–110)
GFR, EST AFRICAN AMERICAN: 53 mL/min/{1.73_m2} — AB (ref >=60)
GFR, EST NON AFRICAN AMERICAN: 46 mL/min/{1.73_m2} — AB (ref >=60)
Glucose, Bld: 94 mg/dL (ref 65–99)
Potassium: 4.4 mmol/L (ref 3.5–5.3)
Sodium: 139 mmol/L (ref 135–146)

## 2018-03-03 ENCOUNTER — Encounter: Payer: Self-pay | Admitting: Internal Medicine

## 2018-03-03 ENCOUNTER — Other Ambulatory Visit: Payer: Self-pay | Admitting: Internal Medicine

## 2018-03-03 ENCOUNTER — Ambulatory Visit (INDEPENDENT_AMBULATORY_CARE_PROVIDER_SITE_OTHER): Payer: Medicare Other | Admitting: Internal Medicine

## 2018-03-03 VITALS — BP 134/76 | HR 84 | Temp 97.8°F | Resp 16 | Ht 63.5 in | Wt 145.6 lb

## 2018-03-03 DIAGNOSIS — R079 Chest pain, unspecified: Secondary | ICD-10-CM | POA: Diagnosis not present

## 2018-03-03 DIAGNOSIS — M79602 Pain in left arm: Secondary | ICD-10-CM | POA: Diagnosis not present

## 2018-03-03 LAB — TROPONIN I: Troponin I: <0.01 ng/mL (ref <=0.0)

## 2018-03-03 NOTE — Progress Notes (Signed)
134/76  

## 2018-03-03 NOTE — Progress Notes (Signed)
[facility]  [name], M.D.  Uvaldo Bristle. [name], P.A.-C   [name], [place] [address] [place], [place] [address] Telephone [phone] Telefax [phone]   Subjective:    Patient ID: Alison Campbell, female    DOB: [birth year], 72 y.o.   MRN: [id number]  HPI  This nice 72 yo WWF with labile HTN, 50+ yr smoking hx, HLD, PreDM, COPD presents with c/o 3 episodes of Left arm discomfort occurring at rest w/o associated N/V, dyspnea or diaphoresis.     Medication Sig  . alendronate (FOSAMAX) 70 MG tablet Take 1 tablet every week on an empty stomach with a full glass of water on an empty stomach for 1 hour.  Marland Kitchen ALPRAZolam (XANAX) 1 MG tablet 1/2 to 1 tablet 2 to 3 x daily as needed for anxiety or sleep  . aspirin 81 MG EC tablet Take 1 tablet daily  . atorvastatin (LIPITOR) 80 MG tablet Take 1/2 to 1 tablet daily or as directed for Cholesterol  . CALCIUM PO Take 2 tablets by mouth daily. 600 mg  2 per day  . Cholecalciferol (VITAMIN D PO) Take 2,000 Units by mouth daily. Take 5 caps to = 10,000 units daily.  . Multiple Vitamin (MULTIVITAMIN PO) Take 1 tablet by mouth daily.   Marland Kitchen GENERLAC 10 GM/15ML SOLN TAKE 30 ML BY MOUTH TWICE DAILY   No facility-administered medications prior to visit.    Allergies  Allergen Reactions  . Fish Oil Diarrhea   Past Medical History:  Diagnosis Date  . Anxiety   . Colon polyps   . GERD (gastroesophageal reflux disease)    coffee related  . Hyperlipidemia   . Insomnia   . Kidney stones   . Osteoporosis   . Pre-diabetes    Past Surgical History:  Procedure Laterality Date  . ABDOMINAL HYSTERECTOMY    . COLONOSCOPY    . hysterectomy    . POLYPECTOMY    . UVULECTOMY N/A 09/09/2016   Procedure: PARTIAL UVULECTOMY, direct laryngoscopy;  Surgeon: Rozetta Nunnery, MD;  Location: [facility];  Service: ENT;  Laterality: N/A;  PARTIAL UVULECTOMY,  direct laryngoscopy   Review of Systems  10 point systems review negative except as above.    Objective:   Physical Exam  BP 134/76   Pulse 84   Temp 97.8 F (36.6 C)   Resp 16   Ht 5' 3.5" (1.613 m)   Wt 145 lb 9.6 oz (66 kg)   BMI 25.39 kg/m   In no  Distress.  HEENT - WNL. Neck - supple.  Chest - Clear equal BS. Cor - Nl HS. RRR w/o sig MGR. PP 1(+). No edema. MS- FROM w/o deformities. No area of point tenderness of the distal Rt arm.  Gait Nl. Neuro -  Nl w/o focal abnormalities.  - 12 lead EKG - WNL    Assessment & Plan:   1. Chest pain at rest  - Troponin I Later returned Negative)   - Ambulatory referral to Cardiology  2. Left arm pain  - Ambulatory referral to Cardiology

## 2018-03-07 NOTE — Progress Notes (Signed)
New Outpatient Visit Date: 03/10/2018  Referring Provider: [name], MD [address] [place], [place] [address]  Chief Complaint: Chest and left arm pain  HPI:  Alison Campbell is a 72 y.o. female who is being seen today for the evaluation of chest and left arm pain at the request of Dr. Melford Aase. She has a history of hypertensin, hyperlipidemia, prediabetes, COPD, and extensive tobacco use history. She was seen by Dr. Melford Aase last week and reported 3 episodes of chest and left arm pain at rest without associated symptoms. EKG and troponin I at that visit were normal.oday, Alison Campbell reports 4 episodes of left arm pain that began about 6-8 weeks ago. It often happens when she is sitting, reading and always improves after taking aspirin. She does not have any exertional pain. There are no other accompanying symptomsthe exception of a single episode of nausea that developed while she was lying on her stomach. She has had left-sided chest pain in the past, but none for several months.The pain began under her sternum and radiated around her chest. She thought that it may be due to her statin, as it resolved after stopping the statin. Alison Campbell denies shortness of breath, palpitations, lightheadedness, orthopnea, and edema.  Alison Campbell denies a history of cardiac disease. She reports undergoing a stress test in the 1990's, which she believes was normal. She cannot recall why the test was ordered. Her daughter is a Marine scientist and will be starting a new job in the cardiac OR at [employer] next month; she encouraged her mother to have further cardiac evaluation.  --------------------------------------------------------------------------------------------------  Cardiovascular History & Procedures: Cardiovascular Problems:  Chest pain  Risk Factors:  Hypertension, hyperlipidemia, prediabetes, tobacco use, and age > 39  Cath/PCI:  None  CV Surgery:  None  EP Procedures and  Devices:  None  Non-Invasive Evaluation(s):  Stress test reportedly normal in the 1990's  Recent CV Pertinent Labs: Lab Results  Component Value Date   CHOL 174 03/23/2017   HDL 40 (L) 03/23/2017   LDLCALC 90 03/23/2017   TRIG 220 (H) 03/23/2017   CHOLHDL 4.4 03/23/2017   K 4.4 11/24/2017   MG 2.0 03/23/2017   BUN 18 11/24/2017   CREATININE 1.18 (H) 11/24/2017    --------------------------------------------------------------------------------------------------  Past Medical History:  Diagnosis Date  . Anxiety   . Colon polyps   . GERD (gastroesophageal reflux disease)    coffee related  . Hyperlipidemia   . Insomnia   . Kidney stones   . Osteoporosis   . Pre-diabetes     Past Surgical History:  Procedure Laterality Date  . ABDOMINAL HYSTERECTOMY    . COLONOSCOPY    . hysterectomy    . POLYPECTOMY    . UVULECTOMY N/A 09/09/2016   Procedure: PARTIAL UVULECTOMY, direct laryngoscopy;  Surgeon: Rozetta Nunnery, MD;  Location: [facility];  Service: ENT;  Laterality: N/A;  PARTIAL UVULECTOMY, direct laryngoscopy    Current Meds  Medication Sig  . alendronate (FOSAMAX) 70 MG tablet Take 1 tablet every week on an empty stomach with a full glass of water on an empty stomach for 1 hour.  Marland Kitchen ALPRAZolam (XANAX) 1 MG tablet 1/2 to 1 tablet 2 to 3 x daily as needed for anxiety or sleep  . aspirin 81 MG EC tablet Take 1 tablet daily  . atorvastatin (LIPITOR) 80 MG tablet Take 1/2 to 1 tablet daily or as directed for Cholesterol  . CALCIUM PO Take 2 tablets by  mouth daily. 600 mg  2 per day  . Cholecalciferol (VITAMIN D PO) Take 2,000 Units by mouth daily. Take 5 caps to = 10,000 units daily.  . Multiple Vitamin (MULTIVITAMIN PO) Take 1 tablet by mouth daily.     Allergies: Fish oil  Social History   Socioeconomic History  . Marital status: Widowed    Spouse name: Not on file  . Number of children: Not on file  . Years of education: Not on file  .  Highest education level: Not on file  Social Needs  . Financial resource strain: Not on file  . Food insecurity - worry: Not on file  . Food insecurity - inability: Not on file  . Transportation needs - medical: Not on file  . Transportation needs - non-medical: Not on file  Occupational History  . Not on file  Tobacco Use  . Smoking status: Current Every Day Smoker    Packs/day: 0.50    Years: 32.00    Pack years: 16.00  . Smokeless tobacco: Never Used  Substance and Sexual Activity  . Alcohol use: No    Alcohol/week: 0.0 oz  . Drug use: No  . Sexual activity: Not on file  Other Topics Concern  . Not on file  Social History Narrative  . Not on file    Family History  Problem Relation Age of Onset  . Heart disease Mother   . Breast cancer Mother   . Heart disease Father   . Diabetes Father   . Cancer Brother        Lung  . Bipolar disorder Daughter   . Colon cancer Neg Hx   . Esophageal cancer Neg Hx   . Stomach cancer Neg Hx     Review of Systems: A 12-system review of systems was performed and was negative except as noted in the HPI.  --------------------------------------------------------------------------------------------------  Physical Exam: BP 130/70 (BP Location: Right Arm, Patient Position: Sitting, Cuff Size: Normal)   Pulse 75   Ht 5' 3.5" (1.613 m)   Wt 146 lb (66.2 kg)   BMI 25.46 kg/m   General:  NAD HEENT: No conjunctival pallor or scleral icterus. Moist mucous membranes. OP clear. Neck: Supple without lymphadenopathy, thyromegaly, JVD, or HJR. No carotid bruit. Lungs: Normal work of breathing. Clear to auscultation bilaterally without wheezes or crackles. Heart: Regular rate and rhythm without murmurs, rubs, or gallops. Non-displaced PMI. Abd: Bowel sounds present. Soft, NT/ND without hepatosplenomegaly Ext: No lower extremity edema. Radial, PT, and DP pulses are 2+ bilaterally Skin: Warm and dry without rash. Neuro: CNIII-XII intact.  Strength and fine-touch sensation intact in upper and lower extremities bilaterally. Psych: Normal mood and affect.  EKG:  NSR without abnormalities.  Lab Results  Component Value Date   WBC 17.0 (H) 11/24/2017   HGB 12.8 11/24/2017   HCT 38.0 11/24/2017   MCV 90.3 11/24/2017   PLT 255 11/24/2017    Lab Results  Component Value Date   NA 139 11/24/2017   K 4.4 11/24/2017   CL 104 11/24/2017   CO2 24 11/24/2017   BUN 18 11/24/2017   CREATININE 1.18 (H) 11/24/2017   GLUCOSE 94 11/24/2017   ALT 14 11/24/2017    Lab Results  Component Value Date   CHOL 174 03/23/2017   HDL 40 (L) 03/23/2017   LDLCALC 90 03/23/2017   TRIG 220 (H) 03/23/2017   CHOLHDL 4.4 03/23/2017     --------------------------------------------------------------------------------------------------  ASSESSMENT AND PLAN: Atypical chest and left arm  pain Chest and left arm pain appear to be two distinct entities, as the chest pain resolved several months ago after Alison Campbell stopped her statin. The left arm pain developed more recently and typically comes on after the patient has been seated for extended periods. EKG and exam are normal today. Cardiac risk factors include hypertension, hyperlipidemia, prediabetes, tobacco use, and age > 73. We have agreed to proceed with exercise pharmacologic myocardial perfusion stress test. Of note, non-contrast CT chest for lung cancer screening in 04/2017 showed coronary artery and aortic atherosclerotic calcification.  Hypertension BP upper normal today. No medication changes at this time.  Hyperlipidemia Most recent LDL a year ago was 60. Alison Campbell currently only takes atorvastatin 40 mg twice a week. Repeat lipid panel and escalation of therapy if LDL has risen will need to be considered.  Aortic atherosclerosis Noted on prior CT of the chest. Aggressive risk factor modification and medical therapy recommended, including stating therapy, blood pressure control,  and tobacco cessation.  Follow-up: Return to clinic in 1 month.  Nelva Bush, MD 03/10/2018 10:35 AM

## 2018-03-10 ENCOUNTER — Encounter: Payer: Self-pay | Admitting: Internal Medicine

## 2018-03-10 ENCOUNTER — Ambulatory Visit (INDEPENDENT_AMBULATORY_CARE_PROVIDER_SITE_OTHER): Payer: Medicare Other | Admitting: Internal Medicine

## 2018-03-10 VITALS — BP 130/70 | HR 75 | Ht 63.5 in | Wt 146.0 lb

## 2018-03-10 DIAGNOSIS — R0789 Other chest pain: Secondary | ICD-10-CM

## 2018-03-10 DIAGNOSIS — I700 Atherosclerosis of aorta: Secondary | ICD-10-CM

## 2018-03-10 DIAGNOSIS — I10 Essential (primary) hypertension: Secondary | ICD-10-CM

## 2018-03-10 DIAGNOSIS — E782 Mixed hyperlipidemia: Secondary | ICD-10-CM | POA: Diagnosis not present

## 2018-03-10 NOTE — Patient Instructions (Addendum)
Medication Instructions:  Your physician recommends that you continue on your current medications as directed. Please refer to the Current Medication list given to you today.   Labwork: none  Testing/Procedures: [facility] EXERCISE/TREADMILL MYOVIEW  Your caregiver has ordered a Stress Test with nuclear imaging. The purpose of this test is to evaluate the blood supply to your heart muscle. This procedure is referred to as a "Non-Invasive Stress Test." This is because other than having an IV started in your vein, nothing is inserted or "invades" your body. Cardiac stress tests are done to find areas of poor blood flow to the heart by determining the extent of coronary artery disease (CAD). Some patients exercise on a treadmill, which naturally increases the blood flow to your heart, while others who are  unable to walk on a treadmill due to physical limitations have a pharmacologic/chemical stress agent called Lexiscan . This medicine will mimic walking on a treadmill by temporarily increasing your coronary blood flow.   Please note: these test may take anywhere between 2-4 hours to complete  PLEASE REPORT TO [facility] AT THE FIRST DESK WILL DIRECT YOU WHERE TO GO  Date of Procedure:_____________________________________  Arrival Time for Procedure:______________________________    PLEASE NOTIFY THE OFFICE AT LEAST 24 HOURS IN ADVANCE IF YOU ARE UNABLE TO KEEP YOUR APPOINTMENT.  [phone] AND  PLEASE NOTIFY NUCLEAR MEDICINE AT [facility] AT LEAST 24 HOURS IN ADVANCE IF YOU ARE UNABLE TO KEEP YOUR APPOINTMENT. [phone]  How to prepare for your Myoview test:  1. Do not eat or drink after midnight 2. No caffeine for 24 hours prior to test 3. No smoking 24 hours prior to test. 4. Your medication may be taken with water.  If your doctor stopped a medication because of this test, do not take that medication. 5. Ladies, please do not wear dresses.  Skirts or pants  are appropriate. Please wear a short sleeve shirt. 6. No perfume, cologne or lotion. 7. Wear comfortable walking shoes. No heels!         Follow-Up: Your physician recommends that you schedule a follow-up appointment in: [place] [facility].  If you need a refill on your cardiac medications before your next appointment, please call your pharmacy.    Cardiac Nuclear Scan A cardiac nuclear scan is a test that measures blood flow to the heart when a person is resting and when he or she is exercising. The test looks for problems such as:  Not enough blood reaching a portion of the heart.  The heart muscle not working normally.  You may need this test if:  You have heart disease.  You have had abnormal lab results.  You have had heart surgery or angioplasty.  You have chest pain.  You have shortness of breath.  In this test, a radioactive dye (tracer) is injected into your bloodstream. After the tracer has traveled to your heart, an imaging device is used to measure how much of the tracer is absorbed by or distributed to various areas of your heart. This procedure is usually done at a hospital and takes 2-4 hours. Tell a health care provider about:  Any allergies you have.  All medicines you are taking, including vitamins, herbs, eye drops, creams, and over-the-counter medicines.  Any problems you or family members have had with the use of anesthetic medicines.  Any blood disorders you have.  Any surgeries you have had.  Any medical conditions you  have.  Whether you are pregnant or may be pregnant. What are the risks? Generally, this is a safe procedure. However, problems may occur, including:  Serious chest pain and heart attack. This is only a risk if the stress portion of the test is done.  Rapid heartbeat.  Sensation of warmth in your chest. This usually passes quickly.  What happens before the procedure?  Ask your health care provider about changing  or stopping your regular medicines. This is especially important if you are taking diabetes medicines or blood thinners.  Remove your jewelry on the day of the procedure. What happens during the procedure?  An IV tube will be inserted into one of your veins.  Your health care provider will inject a small amount of radioactive tracer through the tube.  You will wait for 20-40 minutes while the tracer travels through your bloodstream.  Your heart activity will be monitored with an electrocardiogram (ECG).  You will lie down on an exam table.  Images of your heart will be taken for about 15-20 minutes.  You may be asked to exercise on a treadmill or stationary bike. While you exercise, your heart's activity will be monitored with an ECG, and your blood pressure will be checked. If you are unable to exercise, you may be given a medicine to increase blood flow to parts of your heart.  When blood flow to your heart has peaked, a tracer will again be injected through the IV tube.  After 20-40 minutes, you will get back on the exam table and have more images taken of your heart.  When the procedure is over, your IV tube will be removed. The procedure may vary among health care providers and hospitals. Depending on the type of tracer used, scans may need to be repeated 3-4 hours later. What happens after the procedure?  Unless your health care provider tells you otherwise, you may return to your normal schedule, including diet, activities, and medicines.  Unless your health care provider tells you otherwise, you may increase your fluid intake. This will help flush the contrast dye from your body. Drink enough fluid to keep your urine clear or pale yellow.  It is up to you to get your test results. Ask your health care provider, or the department that is doing the test, when your results will be ready. Summary  A cardiac nuclear scan measures the blood flow to the heart when a person is  resting and when he or she is exercising.  You may need this test if you are at risk for heart disease.  Tell your health care provider if you are pregnant.  Unless your health care provider tells you otherwise, increase your fluid intake. This will help flush the contrast dye from your body. Drink enough fluid to keep your urine clear or pale yellow. This information is not intended to replace advice given to you by your health care provider. Make sure you discuss any questions you have with your health care provider. Document Released: 01/09/2005 Document Revised: 12/17/2016 Document Reviewed: 11/23/2013 Elsevier Interactive Patient Education  2017 [employer].

## 2018-03-11 DIAGNOSIS — R0789 Other chest pain: Secondary | ICD-10-CM | POA: Insufficient documentation

## 2018-03-11 DIAGNOSIS — I700 Atherosclerosis of aorta: Secondary | ICD-10-CM | POA: Insufficient documentation

## 2018-03-17 ENCOUNTER — Ambulatory Visit
Admission: RE | Admit: 2018-03-17 | Discharge: 2018-03-17 | Disposition: A | Discharge status: Home / Self Care | Payer: Medicare Other | Source: Ambulatory Visit | Attending: Internal Medicine | Admitting: Internal Medicine

## 2018-03-17 DIAGNOSIS — I10 Essential (primary) hypertension: Secondary | ICD-10-CM | POA: Diagnosis not present

## 2018-03-17 DIAGNOSIS — R0789 Other chest pain: Secondary | ICD-10-CM

## 2018-03-17 DIAGNOSIS — I700 Atherosclerosis of aorta: Secondary | ICD-10-CM

## 2018-03-17 DIAGNOSIS — E782 Mixed hyperlipidemia: Secondary | ICD-10-CM | POA: Diagnosis not present

## 2018-03-17 LAB — NM MYOCAR MULTI W/SPECT W/WALL MOTION / EF
CSEPED: 4 min
CSEPEDS: 53 s
CSEPHR: 92 %
CSEPPHR: 136 {beats}/min
LV sys vol: 12 mL
LVDIAVOL: 41 mL (ref 46–106)
MPHR: 147 {beats}/min
NUC STRESS TID: 0.85
Rest HR: 77 {beats}/min

## 2018-03-17 MED ORDER — TECHNETIUM TC 99M TETROFOSMIN IV KIT
31.9300 | PACK | Freq: Once | INTRAVENOUS | Status: AC | PRN
  Administered 2018-03-17: 31.93 via INTRAVENOUS

## 2018-03-17 MED ORDER — TECHNETIUM TC 99M TETROFOSMIN IV KIT
13.3690 | PACK | Freq: Once | INTRAVENOUS | Status: AC | PRN
  Administered 2018-03-17: 13.369 via INTRAVENOUS

## 2018-04-12 ENCOUNTER — Ambulatory Visit: Payer: Medicare Other | Admitting: Nurse Practitioner

## 2018-04-19 ENCOUNTER — Encounter: Payer: Self-pay | Admitting: Internal Medicine

## 2018-04-19 ENCOUNTER — Ambulatory Visit (INDEPENDENT_AMBULATORY_CARE_PROVIDER_SITE_OTHER): Payer: Medicare Other | Admitting: Internal Medicine

## 2018-04-19 VITALS — BP 140/84 | HR 72 | Temp 97.6°F | Resp 16 | Ht 63.5 in | Wt 145.8 lb

## 2018-04-19 DIAGNOSIS — E559 Vitamin D deficiency, unspecified: Secondary | ICD-10-CM | POA: Diagnosis not present

## 2018-04-19 DIAGNOSIS — R7309 Other abnormal glucose: Secondary | ICD-10-CM | POA: Diagnosis not present

## 2018-04-19 DIAGNOSIS — E782 Mixed hyperlipidemia: Secondary | ICD-10-CM | POA: Diagnosis not present

## 2018-04-19 DIAGNOSIS — R7303 Prediabetes: Secondary | ICD-10-CM | POA: Diagnosis not present

## 2018-04-19 DIAGNOSIS — J42 Unspecified chronic bronchitis: Secondary | ICD-10-CM

## 2018-04-19 DIAGNOSIS — Z1211 Encounter for screening for malignant neoplasm of colon: Secondary | ICD-10-CM

## 2018-04-19 DIAGNOSIS — Z79899 Other long term (current) drug therapy: Secondary | ICD-10-CM

## 2018-04-19 DIAGNOSIS — F17200 Nicotine dependence, unspecified, uncomplicated: Secondary | ICD-10-CM

## 2018-04-19 DIAGNOSIS — Z1212 Encounter for screening for malignant neoplasm of rectum: Secondary | ICD-10-CM

## 2018-04-19 DIAGNOSIS — I10 Essential (primary) hypertension: Secondary | ICD-10-CM | POA: Diagnosis not present

## 2018-04-19 DIAGNOSIS — Z122 Encounter for screening for malignant neoplasm of respiratory organs: Secondary | ICD-10-CM

## 2018-04-19 NOTE — Patient Instructions (Signed)

## 2018-04-19 NOTE — Progress Notes (Signed)
[facility] [name], M.D.     Alison Campbell. Alison Campbell, P.A.-C Alison Campbell, [place] [address] [place] [address] Telephone [phone] Telefax [phone]  Comprehensive Evaluation &  Examination     This very nice 72 y.o. MWF presents for a Screening/Preventative Visit & comprehensive evaluation and management of multiple medical co-morbidities.  Patient has been followed for HTN, HLD, Prediabetes, COPD (50+ yr smoking hx) and Vitamin D Deficiency. Patient is on Fosamax for hx/o Osteoporosis. Due to her long history of smoking over 50 pk years,  I discussed lung cancer screening with  her. She was agreeable to undergo a screening low dose CT scan of the chest.  We discussed smoking cessation techniques/options. I will refer her for a LDCT lung scan & lung cancer screening program. She had a neg LD screening CT lung scan in May 2018.      Patient has been followed expectantly for labile HTN for years. Patient's BP has been controlled at home.On 03/17/2018, she had a normal Myoview Stress Test w/EF 65%.   Patient denies any cardiac symptoms as exertional chest pain, palpitations, shortness of breath, dizziness or ankle swelling. Today's BP was initially sl elevated and was rechecked at goal -  140/84.     Patient's hyperlipidemia is controlled with diet and medications. Patient denies myalgias or other medication SE's. Last lipids were at goal albeit elevated Trig's: Lab Results  Component Value Date   CHOL 174 03/23/2017   HDL 40 (L) 03/23/2017   LDLCALC 90 03/23/2017   TRIG 220 (H) 03/23/2017   CHOLHDL 4.4 03/23/2017      Patient has prediabetes with A1c 6.0% in 2011 and patient denies reactive hypoglycemic symptoms, visual blurring, diabetic polys, or paresthesias. Last A1c was at goal: Lab Results  Component Value Date   HGBA1C 5.2 03/23/2017      Finally, patient has history of Vitamin D  Deficiency ("35"/2008)  and last Vitamin D was at goal: Lab Results  Component Value Date   VD25OH 71 03/23/2017   Current Outpatient Medications on File Prior to Visit  Medication Sig  . Cyanocobalamin (VITAMIN B 12 PO) Take 1 tablet by mouth daily.  Marland Kitchen alendronate (FOSAMAX) 70 MG tablet Take 1 tablet every week on an empty stomach with a full glass of water on an empty stomach for 1 hour.  Marland Kitchen ALPRAZolam (XANAX) 1 MG tablet 1/2 to 1 tablet 2 to 3 x daily as needed for anxiety or sleep  . aspirin 325 MG tablet Take 325 mg by mouth every 6 (six) hours as needed (pain).  Marland Kitchen aspirin 81 MG EC tablet Take 1 tablet daily  . atorvastatin (LIPITOR) 80 MG tablet Take 1/2 to 1 tablet daily or as directed for Cholesterol  . CALCIUM PO Take 2 tablets by mouth daily. 600 mg  2 per day  . Cholecalciferol (VITAMIN D PO) Take 2,000 Units by mouth daily. Take 5 caps to = 10,000 units daily.  . Multiple Vitamin (MULTIVITAMIN PO) Take 1 tablet by mouth daily.    No current facility-administered medications on file prior to visit.    Allergies  Allergen Reactions  . Fish Oil Diarrhea   Past Medical History:  Diagnosis Date  . Anxiety   . Colon polyps   . GERD (gastroesophageal reflux disease)    coffee related  . Hyperlipidemia   . Insomnia   . Kidney stones   . Osteoporosis   .  Pre-diabetes    Health Maintenance  Topic Date Due  . Hepatitis C Screening  1946-07-04  . COLONOSCOPY  09/09/2014  . INFLUENZA VACCINE  07/29/2018  . TETANUS/TDAP  02/27/2019  . MAMMOGRAM  05/16/2019  . DEXA SCAN  Completed  . PNA vac Low Risk Adult  Completed   Immunization History  Administered Date(s) Administered  . Influenza, High Dose Seasonal PF 08/29/2013, 10/25/2015, 11/24/2017  . Pneumococcal Conjugate-13 02/02/2015  . Pneumococcal Polysaccharide-23 03/29/2010  . Tdap 02/26/2009  . Zoster 12/29/2008   Last Colon - 09/10/2011 - Dr Sharlett Iles Adenomatous polyp(s) - recc 3 year f/u   Last MGM -  05/15/2017  Past Surgical History:  Procedure Laterality Date  . ABDOMINAL HYSTERECTOMY    . COLONOSCOPY    . hysterectomy    . POLYPECTOMY    . UVULECTOMY N/A 09/09/2016   Procedure: PARTIAL UVULECTOMY, direct laryngoscopy;  Surgeon: Rozetta Nunnery, MD;  Location: [facility];  Service: ENT;  Laterality: N/A;  PARTIAL UVULECTOMY, direct laryngoscopy   Family History  Problem Relation Age of Onset  . Heart disease Mother   . Breast cancer Mother   . Heart disease Father   . Diabetes Father   . Cancer Brother        Lung  . Bipolar disorder Daughter   . Colon cancer Neg Hx   . Esophageal cancer Neg Hx   . Stomach cancer Neg Hx    Social History   Tobacco Use  . Smoking status: Current Every Day Smoker    Packs/day: 0.50    Years: 50.00    Pack years: 25.00  . Smokeless tobacco: Never Used  Substance Use Topics  . Alcohol use: No    Alcohol/week: 0.0 oz  . Drug use: No    ROS Constitutional: Denies fever, chills, weight loss/gain, headaches, insomnia,  night sweats, and change in appetite. Does c/o fatigue. Eyes: Denies redness, blurred vision, diplopia, discharge, itchy, watery eyes.  ENT: Denies discharge, congestion, post nasal drip, epistaxis, sore throat, earache, hearing loss, dental pain, Tinnitus, Vertigo, Sinus pain, snoring.  Cardio: Denies chest pain, palpitations, irregular heartbeat, syncope, dyspnea, diaphoresis, orthopnea, PND, claudication, edema Respiratory: denies cough, dyspnea, DOE, pleurisy, hoarseness, laryngitis, wheezing.  Gastrointestinal: Denies dysphagia, heartburn, reflux, water brash, pain, cramps, nausea, vomiting, bloating, diarrhea, constipation, hematemesis, melena, hematochezia, jaundice, hemorrhoids Genitourinary: Denies dysuria, frequency, urgency, nocturia, hesitancy, discharge, hematuria, flank pain Breast: Breast lumps, nipple discharge, bleeding.  Musculoskeletal: Denies arthralgia, myalgia, stiffness, Jt. Swelling,  pain, limp, and strain/sprain. Denies falls. Skin: Denies puritis, rash, hives, warts, acne, eczema, changing in skin lesion Neuro: No weakness, tremor, incoordination, spasms, paresthesia, pain Psychiatric: Denies confusion, memory loss, sensory loss. Denies Depression. Endocrine: Denies change in weight, skin, hair change, nocturia, and paresthesia, diabetic polys, visual blurring, hyper / hypo glycemic episodes.  Heme/Lymph: No excessive bleeding, bruising, enlarged lymph nodes.  Physical Exam  BP 140/84   Pulse 72   Temp 97.6 F (36.4 C)   Resp 16   Ht 5' 3.5" (1.613 m)   Wt 145 lb 12.8 oz (66.1 kg)   LMP  (Exact Date)   BMI 25.42 kg/m   General Appearance: Well nourished, well groomed and in no apparent distress.  Eyes: PERRLA, EOMs, conjunctiva no swelling or erythema, normal fundi and vessels. Sinuses: No frontal/maxillary tenderness ENT/Mouth: EACs patent / TMs  nl. Nares clear without erythema, swelling, mucoid exudates. Oral hygiene is good. No erythema, swelling, or exudate. Tongue normal, non-obstructing. Tonsils not swollen  or erythematous. Hearing normal.  Neck: Supple, thyroid not palpable. No bruits, nodes or JVD. Respiratory: Respiratory effort normal.  BS equal and clear bilateral without rales, rhonci, wheezing or stridor. Cardio: Heart sounds are normal with regular rate and rhythm and no murmurs, rubs or gallops. Peripheral pulses are normal and equal bilaterally without edema. No aortic or femoral bruits. Chest: symmetric with normal excursions and percussion. Breasts: Symmetric, without lumps, nipple discharge, retractions, or fibrocystic changes.  Abdomen: Flat, soft with bowel sounds active. Nontender, no guarding, rebound, hernias, masses, or organomegaly.  Lymphatics: Non tender without lymphadenopathy.  Genitourinary:  Musculoskeletal: Full ROM all peripheral extremities, joint stability, 5/5 strength, and normal gait. Skin: Warm and dry without rashes,  lesions, cyanosis, clubbing or  ecchymosis.  Neuro: Cranial nerves intact, reflexes equal bilaterally. Normal muscle tone, no cerebellar symptoms. Sensation intact.  Pysch: Alert and oriented X 3, normal affect, Insight and Judgment appropriate.   Assessment and Plan  1. Essential hypertension  - Urinalysis, Routine w reflex microscopic - Microalbumin / creatinine urine ratio - CBC with Differential/Platelet - COMPLETE METABOLIC PANEL WITH GFR - Magnesium - TSH  2. Hyperlipidemia, mixed  - COMPLETE METABOLIC PANEL WITH GFR - Lipid panel  3. Abnormal glucose  - Hemoglobin A1c - Insulin, random  4. Vitamin D deficiency  Vit D level   5. Prediabetes  - Hemoglobin A1c - Insulin, random  6. Chronic bronchitis   ([place]) 7. Screening for colorectal cancer  - POC Hemoccult Bld/Stl  8. Medication management  - Urinalysis, Routine w reflex microscopic - Microalbumin / creatinine urine ratio - CBC with Differential/Platelet - COMPLETE METABOLIC PANEL WITH GFR - Magnesium - Lipid panel - TSH - Hemoglobin A1c - Insulin, random - VITAMIN D 25 Hydroxyl           Patient was counseled in prudent diet to achieve/maintain BMI less than 25 for weight control, BP monitoring, regular exercise and medications. Discussed med's effects and SE's. Screening labs and tests as requested with regular follow-up as recommended. Over 40 minutes of exam, counseling, chart review and high complex critical decision making was performed.

## 2018-04-20 LAB — COMPLETE METABOLIC PANEL WITH GFR
AG Ratio: 1.7 (calc) (ref 1.0–2.5)
ALBUMIN MSPROF: 4.5 g/dL (ref 3.6–5.1)
ALT: 18 U/L (ref 6–29)
AST: 15 U/L (ref 10–35)
Alkaline phosphatase (APISO): 59 U/L (ref 33–130)
BILIRUBIN TOTAL: 0.3 mg/dL (ref 0.2–1.2)
BUN / CREAT RATIO: 15 (calc) (ref 6–22)
BUN: 17 mg/dL (ref 7–25)
CO2: 28 mmol/L (ref 20–32)
CREATININE: 1.12 mg/dL — AB (ref 0.60–0.93)
Calcium: 9.9 mg/dL (ref 8.6–10.4)
Chloride: 106 mmol/L (ref 98–110)
GFR, EST AFRICAN AMERICAN: 56 mL/min/{1.73_m2} — AB (ref >=60)
GFR, Est Non African American: 49 mL/min/{1.73_m2} — ABNORMAL LOW (ref >=60)
GLUCOSE: 87 mg/dL (ref 65–99)
Globulin: 2.7 g/dL (calc) (ref 1.9–3.7)
Potassium: 4.5 mmol/L (ref 3.5–5.3)
Sodium: 140 mmol/L (ref 135–146)
TOTAL PROTEIN: 7.2 g/dL (ref 6.1–8.1)

## 2018-04-20 LAB — CBC WITH DIFFERENTIAL/PLATELET
BASOS PCT: 0.7 %
Basophils Absolute: 62 cells/uL (ref 0–200)
EOS ABS: 273 {cells}/uL (ref 15–500)
Eosinophils Relative: 3.1 %
HEMATOCRIT: 41.7 % (ref 35.0–45.0)
HEMOGLOBIN: 14.3 g/dL (ref 11.7–15.5)
LYMPHS ABS: 2455 {cells}/uL (ref 850–3900)
MCH: 30.9 pg (ref 27.0–33.0)
MCHC: 34.3 g/dL (ref 32.0–36.0)
MCV: 90.1 fL (ref 80.0–100.0)
MONOS PCT: 6.2 %
MPV: 11.4 fL (ref 7.5–12.5)
NEUTROS ABS: 5465 {cells}/uL (ref 1500–7800)
Neutrophils Relative %: 62.1 %
Platelets: 311 10*3/uL (ref 140–400)
RBC: 4.63 10*6/uL (ref 3.80–5.10)
RDW: 12.4 % (ref 11.0–15.0)
Total Lymphocyte: 27.9 %
WBC mixed population: 546 cells/uL (ref 200–950)
WBC: 8.8 10*3/uL (ref 3.8–10.8)

## 2018-04-20 LAB — HEMOGLOBIN A1C
Hgb A1c MFr Bld: 5.5 % of total Hgb (ref <5.7)
MEAN PLASMA GLUCOSE: 111 (calc)
eAG (mmol/L): 6.2 (calc)

## 2018-04-20 LAB — LIPID PANEL
CHOLESTEROL: 164 mg/dL (ref <200)
HDL: 43 mg/dL — AB (ref >50)
LDL CHOLESTEROL (CALC): 93 mg/dL
Non-HDL Cholesterol (Calc): 121 mg/dL (calc) (ref <130)
TRIGLYCERIDES: 189 mg/dL — AB (ref <150)
Total CHOL/HDL Ratio: 3.8 (calc) (ref <5.0)

## 2018-04-20 LAB — MAGNESIUM: MAGNESIUM: 2 mg/dL (ref 1.5–2.5)

## 2018-04-20 LAB — URINALYSIS, ROUTINE W REFLEX MICROSCOPIC
Bilirubin Urine: NEGATIVE
GLUCOSE, UA: NEGATIVE
Hgb urine dipstick: NEGATIVE
KETONES UR: NEGATIVE
Leukocytes, UA: NEGATIVE
Nitrite: NEGATIVE
PROTEIN: NEGATIVE
Specific Gravity, Urine: 1.016 (ref 1.001–1.03)
pH: <=5 (ref 5.0–8.0)

## 2018-04-20 LAB — VITAMIN D 25 HYDROXY (VIT D DEFICIENCY, FRACTURES): Vit D, 25-Hydroxy: 71 ng/mL (ref 30–100)

## 2018-04-20 LAB — MICROALBUMIN / CREATININE URINE RATIO
Creatinine, Urine: 97 mg/dL (ref 20–275)
Microalb Creat Ratio: 7 mcg/mg creat (ref <30)
Microalb, Ur: 0.7 mg/dL

## 2018-04-20 LAB — TSH: TSH: 1.38 mIU/L (ref 0.40–4.50)

## 2018-04-28 ENCOUNTER — Ambulatory Visit
Admission: RE | Admit: 2018-04-28 | Discharge: 2018-04-28 | Disposition: A | Discharge status: Home / Self Care | Payer: Medicare Other | Source: Ambulatory Visit | Attending: Internal Medicine | Admitting: Internal Medicine

## 2018-04-28 DIAGNOSIS — F17200 Nicotine dependence, unspecified, uncomplicated: Secondary | ICD-10-CM

## 2018-04-28 DIAGNOSIS — R918 Other nonspecific abnormal finding of lung field: Secondary | ICD-10-CM | POA: Diagnosis not present

## 2018-08-06 ENCOUNTER — Ambulatory Visit: Payer: Self-pay | Admitting: Adult Health

## 2018-10-29 ENCOUNTER — Other Ambulatory Visit: Payer: Self-pay | Admitting: Adult Health

## 2018-10-29 DIAGNOSIS — M810 Age-related osteoporosis without current pathological fracture: Secondary | ICD-10-CM

## 2018-11-19 ENCOUNTER — Ambulatory Visit: Payer: Self-pay | Admitting: Internal Medicine

## 2018-11-28 NOTE — Progress Notes (Signed)
Patient ID: Alison Campbell, female   DOB: 06/22/1946, 72 y.o.   MRN: [id number]   Medicare wellness and 3 month follow up  Assessment:   Encounter for Medicare annual wellness exam 1 year followup  Essential hypertension - continue medications, DASH diet, exercise and monitor at home. Call if greater than 130/80.  -     CBC with Differential/Platelet -     COMPLETE METABOLIC PANEL WITH GFR -     TSH  Aortic atherosclerosis (HCC) Control blood pressure, cholesterol, glucose, increase exercise.   Obstructive chronic bronchitis without exacerbation ([place]) Advised to stop smoking - risks discussed. Declines medications.  CT low dose annually- last done 04/2018 Currently stable without respiratory medications  Mixed hyperlipidemia check lipids decrease fatty foods increase activity. -     Lipid panel  Abnormal glucose Discussed disease progression and risks Discussed diet/exercise, weight management and risk modification  Medication management -     Magnesium  Tobacco use disorder Smoking cessation-  instruction/counseling given, counseled patient on the dangers of tobacco use, advised patient to stop smoking, and reviewed strategies to maximize success, patient not ready to quit at this time.   Vitamin D deficiency Continue supplement  Diverticulosis of large intestine without hemorrhage Monitor Increase fiber  Osteoporosis, unspecified osteoporosis type, unspecified pathological fracture presence Has been on fosamax x 5 years Will stop at this time and take a year off Repeat DEXA 2020 ? Need referral for prolia  Polyp of colon, unspecified part of colon, unspecified type UTD  Needs flu shot -     Flu vaccine HIGH DOSE PF    Future Appointments  Date Time Provider [place]  05/16/2019 10:00 AM [name], MD [facility] None     Plan:   During the course of the visit the patient was educated and counseled about appropriate screening and  preventive services including:   Diabetes screening  Nutrition counseling   Smoking cessation counseling  Conditions/risks identified: BMI: Discussed weight loss, diet, and increase physical activity.  Increase physical activity: AHA recommends 150 minutes of physical activity a week.  Medications reviewed DEXA- due this year Diabetes is at goal, ACE/ARB therapy: No, Reason not on Ace Inhibitor/ARB therapy:  not a diabetic Urinary Incontinence is not an issue: discussed non pharmacology and pharmacology options.  Fall risk: low- discussed PT, home fall assessment, medications.   Subjective:   Alison Campbell is a 72 y.o. female who presents for Medicare Annual Wellness Visit and 3 month follow up on hypertension, prediabetes, hyperlipidemia, vitamin D def.   She had normal stress myoview 02/2018. She has COPD, had Ct lung cancer screening 05/201. She is still smoking.  Her blood pressure has been controlled at home, today their BP is BP: 118/76 She does not workout. She denies chest pain, shortness of breath, dizziness.  She is on cholesterol medication, pravastatin and denies myalgias. Her cholesterol is at goal. The cholesterol last visit was:   Lab Results  Component Value Date   CHOL 164 04/19/2018   HDL 43 (L) 04/19/2018   LDLCALC 93 04/19/2018   TRIG 189 (H) 04/19/2018   CHOLHDL 3.8 04/19/2018   She has been working on diet and exercise for prediabetes, and denies foot ulcerations, paresthesia of the feet and polydipsia. Last A1C in the office was:  Lab Results  Component Value Date   HGBA1C 5.5 04/19/2018   Patient is on Vitamin D supplement. Lab Results  Component Value Date   VD25OH 71 04/19/2018  She is on fosamax, she is on x 2015, last DEXA was 12/17/2016 Osteoporosis- - 2.7  She is a smoker, has cut back.  BMI is Body mass index is 25.39 kg/m., she is working on diet and exercise. Wt Readings from Last 3 Encounters:  11/29/18 145 lb 9.6 oz (66 kg)   04/19/18 145 lb 12.8 oz (66.1 kg)  03/10/18 146 lb (66.2 kg)     Medication Review Current Outpatient Medications on File Prior to Visit  Medication Sig Dispense Refill  . alendronate (FOSAMAX) 70 MG tablet TAKE 1 TABLET BY MOUTH EVERY WEEK ON AN EMPTY STOMACH WITH A FULL GLASS OF WATER FOR 1 HOUR 12 tablet 3  . ALPRAZolam (XANAX) 1 MG tablet 1/2 to 1 tablet 2 to 3 x daily as needed for anxiety or sleep 270 tablet 1  . aspirin 325 MG tablet Take 325 mg by mouth every 6 (six) hours as needed (pain).    Marland Kitchen aspirin 81 MG EC tablet Take 1 tablet daily    . CALCIUM PO Take 2 tablets by mouth daily. 600 mg  2 per day    . Cholecalciferol (VITAMIN D PO) Take 2,000 Units by mouth daily. Take 5 caps to = 10,000 units daily.    . Cyanocobalamin (VITAMIN B 12 PO) Take 1 tablet by mouth daily.    . Multiple Vitamin (MULTIVITAMIN PO) Take 1 tablet by mouth daily.     Marland Kitchen atorvastatin (LIPITOR) 80 MG tablet Take 1/2 to 1 tablet daily or as directed for Cholesterol 30 tablet 5   No current facility-administered medications on file prior to visit.    Allergies  Allergen Reactions  . Fish Oil Diarrhea     Current Problems (verified) Patient Active Problem List   Diagnosis Date Noted  . Aortic atherosclerosis ([place]) 03/11/2018  . Atypical chest pain 03/11/2018  . Tobacco use disorder 06/21/2015  . Palpitations 06/21/2015  . Osteoporosis 06/21/2015  . Medication management 02/02/2015  . Essential hypertension 11/07/2013  . Mixed hyperlipidemia 11/07/2013  . Abnormal glucose 11/07/2013  . Obstructive chronic bronchitis without exacerbation ([place]) 11/07/2013  . Vitamin D deficiency 11/07/2013  . Diverticulosis of large intestine 09/10/2011  . Colon polyp 09/10/2011    Screening Tests Health Maintenance  Topic Date Due  . Hepatitis C Screening  February 13, 1946  . COLONOSCOPY  09/09/2014  . TETANUS/TDAP  02/27/2019  . MAMMOGRAM  05/16/2019  . INFLUENZA VACCINE  Completed  . DEXA SCAN  Completed   . PNA vac Low Risk Adult  Completed     Immunization History  Administered Date(s) Administered  . Influenza, High Dose Seasonal PF 08/29/2013, 10/25/2015, 11/24/2017, 11/29/2018  . Pneumococcal Conjugate-13 02/02/2015  . Pneumococcal Polysaccharide-23 03/29/2010  . Tdap 02/26/2009  . Zoster 12/29/2008   Preventative care: Last colonoscopy: 09/10/11 Last mammogram: 05/15/2017 cat B will get may 2020 Last pap smear/pelvic exam:> 5 years Patient declines   DEXA:12/17/2016 Osteoporosis- - 2.7  CXR:01/2015 COPD CT low dose 04/2017- will get next year  Prior vaccinations: TD or Tdap: 02/2009  Influenza:08/2013 Pneumococcal: 03/2010 Prevnar 13: 2016 Shingles/Zostavax: 2010  Names of Other Physician/Practitioners you currently use: Patient Care Team: [name], MD as PCP - General (Internal Medicine) Milus Banister, MD as Attending Physician (Gastroenterology) Sable Feil, MD as Consulting Physician (Gastroenterology) Rozetta Nunnery, MD as Consulting Physician (Otolaryngology) Druscilla Brownie, MD as Consulting Physician (Dermatology) [employer], (Eye)  Allergies Allergies  Allergen Reactions  . Fish Oil Diarrhea    SURGICAL HISTORY She  has a past surgical history that includes hysterectomy; Colonoscopy; Polypectomy; Abdominal hysterectomy; and Uvulectomy (N/A, 09/09/2016). FAMILY HISTORY Her family history includes Bipolar disorder in her daughter; Breast cancer in her mother; Cancer in her brother; Diabetes in her father; Heart disease in her father and mother. SOCIAL HISTORY She  reports that she has been smoking. She has a 25.00 pack-year smoking history. She has never used smokeless tobacco. She reports that she does not drink alcohol or use drugs.  MEDICARE WELLNESS OBJECTIVES: Physical activity:   Cardiac risk factors:   Depression/mood screen:   Depression screen [facility] 2/9 04/19/2018  Decreased Interest 0  Down, Depressed, Hopeless 0  PHQ - 2  Score 0    ADLs:  In your present state of health, do you have any difficulty performing the following activities: 04/19/2018  Hearing? N  Vision? N  Difficulty concentrating or making decisions? N  Walking or climbing stairs? N  Dressing or bathing? N  Doing errands, shopping? N  Some recent data might be hidden     Cognitive Testing  Alert? Yes  Normal Appearance?Yes  Oriented to person? Yes  Place? Yes   Time? Yes  Recall of three objects?  2/3  Can perform simple calculations? Yes  Displays appropriate judgment?Yes  Can read the correct time from a watch face?Yes  EOL planning: Does Patient Have a Medical Advance Directive?: Yes Type of Advance Directive: Healthcare Power of Attorney, Living will [place] in Chart?: No - copy requested  Does patient have a [place]? Yes, Clydia Llano- daughter Does patient have a Living Will? Yes  Objective:   Blood pressure 118/76, pulse 70, temperature 98.1 F (36.7 C), height 5' 3.5" (1.613 m), weight 145 lb 9.6 oz (66 kg), SpO2 97 %. Body mass index is 25.39 kg/m.  General appearance: alert, no distress, WD/WN,  female HEENT: normocephalic, sclerae anicteric, TMs pearly, nares patent, no discharge or erythema, pharynx normal, crowded mouth.  Oral cavity: MMM, no lesions Neck: supple, no lymphadenopathy, no thyromegaly, no masses Heart: RRR, normal S1, S2, no murmurs Lungs: CTA bilaterally, no wheezes, rhonchi, or rales Abdomen: +bs, soft, non tender, non distended, no masses, no hepatomegaly, no splenomegaly Musculoskeletal: nontender, no swelling, no obvious deformity Extremities: no edema, no cyanosis, no clubbing Pulses: 2+ symmetric, upper and lower extremities, normal cap refill Skin: Palms of hands and soles of feet with mild erythema/scaling. Neurological: alert, oriented x 3, CN2-12 intact, strength normal upper extremities and lower extremities, sensation normal  throughout, DTRs 2+ throughout, no cerebellar signs, gait normal Psychiatric: normal affect, behavior normal, pleasant  Breast: Patient declines Gyn:  Patient declines  Rectal: Patient declines   Medicare Attestation I have personally reviewed: The patient's medical and social history Their use of alcohol, tobacco or illicit drugs Their current medications and supplements The patient's functional ability including ADLs,fall risks, home safety risks, cognitive, and hearing and visual impairment Diet and physical activities Evidence for depression or mood disorders  The patient's weight, height, BMI, and visual acuity have been recorded in the chart.  I have made referrals, counseling, and provided education to the patient based on review of the above and I have provided the patient with a written personalized care plan for preventive services.     Vicie Mutters, PA-C   11/29/2018

## 2018-11-29 ENCOUNTER — Ambulatory Visit: Payer: Self-pay | Admitting: Internal Medicine

## 2018-11-29 ENCOUNTER — Encounter: Payer: Self-pay | Admitting: Physician Assistant

## 2018-11-29 ENCOUNTER — Ambulatory Visit (INDEPENDENT_AMBULATORY_CARE_PROVIDER_SITE_OTHER): Payer: Medicare Other | Admitting: Physician Assistant

## 2018-11-29 VITALS — BP 118/76 | HR 70 | Temp 98.1°F | Ht 63.5 in | Wt 145.6 lb

## 2018-11-29 DIAGNOSIS — E559 Vitamin D deficiency, unspecified: Secondary | ICD-10-CM

## 2018-11-29 DIAGNOSIS — K635 Polyp of colon: Secondary | ICD-10-CM | POA: Diagnosis not present

## 2018-11-29 DIAGNOSIS — Z23 Encounter for immunization: Secondary | ICD-10-CM | POA: Diagnosis not present

## 2018-11-29 DIAGNOSIS — I700 Atherosclerosis of aorta: Secondary | ICD-10-CM

## 2018-11-29 DIAGNOSIS — I10 Essential (primary) hypertension: Secondary | ICD-10-CM

## 2018-11-29 DIAGNOSIS — F17200 Nicotine dependence, unspecified, uncomplicated: Secondary | ICD-10-CM

## 2018-11-29 DIAGNOSIS — M810 Age-related osteoporosis without current pathological fracture: Secondary | ICD-10-CM

## 2018-11-29 DIAGNOSIS — E782 Mixed hyperlipidemia: Secondary | ICD-10-CM

## 2018-11-29 DIAGNOSIS — K5730 Diverticulosis of large intestine without perforation or abscess without bleeding: Secondary | ICD-10-CM | POA: Diagnosis not present

## 2018-11-29 DIAGNOSIS — J4489 Other specified chronic obstructive pulmonary disease: Secondary | ICD-10-CM

## 2018-11-29 DIAGNOSIS — Z0001 Encounter for general adult medical examination with abnormal findings: Secondary | ICD-10-CM | POA: Diagnosis not present

## 2018-11-29 DIAGNOSIS — R6889 Other general symptoms and signs: Secondary | ICD-10-CM | POA: Diagnosis not present

## 2018-11-29 DIAGNOSIS — Z79899 Other long term (current) drug therapy: Secondary | ICD-10-CM

## 2018-11-29 DIAGNOSIS — R7309 Other abnormal glucose: Secondary | ICD-10-CM | POA: Diagnosis not present

## 2018-11-29 DIAGNOSIS — J449 Chronic obstructive pulmonary disease, unspecified: Secondary | ICD-10-CM

## 2018-11-29 DIAGNOSIS — Z0000 Encounter for general adult medical examination without abnormal findings: Secondary | ICD-10-CM

## 2018-11-29 NOTE — Patient Instructions (Addendum)
Stop the fosamax when you run out, have been on it for 5 years Need to be off for at least a year and then we will re evaluate  Need DEXa with Mammogram in May Will schedule.    Osteoporosis Osteoporosis is the thinning and loss of density in the bones. Osteoporosis makes the bones more brittle, fragile, and likely to break (fracture). Over time, osteoporosis can cause the bones to become so weak that they fracture after a simple fall. The bones most likely to fracture are the bones in the hip, wrist, and spine. What are the causes? The exact cause is not known. What increases the risk? Anyone can develop osteoporosis. You may be at greater risk if you have a family history of the condition or have poor nutrition. You may also have a higher risk if you are:  Female.  25 years old or older.  A smoker.  Not physically active.  White or Asian.  Slender.  What are the signs or symptoms? A fracture might be the first sign of the disease, especially if it results from a fall or injury that would not usually cause a bone to break. Other signs and symptoms include:  Low back and neck pain.  Stooped posture.  Height loss.  How is this diagnosed? To make a diagnosis, your health care provider may:  Take a medical history.  Perform a physical exam.  Order tests, such as: ? A bone mineral density test. ? A dual-energy X-ray absorptiometry test.  How is this treated? The goal of osteoporosis treatment is to strengthen your bones to reduce your risk of a fracture. Treatment may involve:  Making lifestyle changes, such as: ? Eating a diet rich in calcium. ? Doing weight-bearing and muscle-strengthening exercises. ? Stopping tobacco use. ? Limiting alcohol intake.  Taking medicine to slow the process of bone loss or to increase bone density.  Monitoring your levels of calcium and vitamin D.  Follow these instructions at home:  Include calcium and vitamin D in your diet.  Calcium is important for bone health, and vitamin D helps the body absorb calcium.  Perform weight-bearing and muscle-strengthening exercises as directed by your health care provider.  Do not use any tobacco products, including cigarettes, chewing tobacco, and electronic cigarettes. If you need help quitting, ask your health care provider.  Limit your alcohol intake.  Take medicines only as directed by your health care provider.  Keep all follow-up visits as directed by your health care provider. This is important.  Take precautions at home to lower your risk of falling, such as: ? Keeping rooms well lit and clutter free. ? Installing safety rails on stairs. ? Using rubber mats in the bathroom and other areas that are often wet or slippery. Get help right away if: You fall or injure yourself. This information is not intended to replace advice given to you by your health care provider. Make sure you discuss any questions you have with your health care provider. Document Released: 09/24/2005 Document Revised: 05/19/2016 Document Reviewed: 05/25/2014 Elsevier Interactive Patient Education  [employer].

## 2018-11-30 LAB — COMPLETE METABOLIC PANEL WITH GFR
AG Ratio: 1.7 (calc) (ref 1.0–2.5)
ALT: 16 U/L (ref 6–29)
AST: 14 U/L (ref 10–35)
Albumin: 4.3 g/dL (ref 3.6–5.1)
Alkaline phosphatase (APISO): 60 U/L (ref 33–130)
BUN/Creatinine Ratio: 18 (calc) (ref 6–22)
BUN: 19 mg/dL (ref 7–25)
CALCIUM: 9.8 mg/dL (ref 8.6–10.4)
CO2: 27 mmol/L (ref 20–32)
Chloride: 107 mmol/L (ref 98–110)
Creat: 1.06 mg/dL — ABNORMAL HIGH (ref 0.60–0.93)
GFR, Est African American: 60 mL/min/{1.73_m2} (ref >=60)
GFR, Est Non African American: 52 mL/min/{1.73_m2} — ABNORMAL LOW (ref >=60)
Globulin: 2.6 g/dL (calc) (ref 1.9–3.7)
Glucose, Bld: 79 mg/dL (ref 65–99)
Potassium: 4.4 mmol/L (ref 3.5–5.3)
Sodium: 139 mmol/L (ref 135–146)
Total Bilirubin: 0.3 mg/dL (ref 0.2–1.2)
Total Protein: 6.9 g/dL (ref 6.1–8.1)

## 2018-11-30 LAB — CBC WITH DIFFERENTIAL/PLATELET
Basophils Absolute: 59 cells/uL (ref 0–200)
Basophils Relative: 0.8 %
Eosinophils Absolute: 192 cells/uL (ref 15–500)
Eosinophils Relative: 2.6 %
HCT: 41.8 % (ref 35.0–45.0)
Hemoglobin: 14 g/dL (ref 11.7–15.5)
Lymphs Abs: 2427 cells/uL (ref 850–3900)
MCH: 30.4 pg (ref 27.0–33.0)
MCHC: 33.5 g/dL (ref 32.0–36.0)
MCV: 90.9 fL (ref 80.0–100.0)
MPV: 12 fL (ref 7.5–12.5)
Monocytes Relative: 5.7 %
NEUTROS ABS: 4299 {cells}/uL (ref 1500–7800)
Neutrophils Relative %: 58.1 %
Platelets: 296 10*3/uL (ref 140–400)
RBC: 4.6 10*6/uL (ref 3.80–5.10)
RDW: 12.3 % (ref 11.0–15.0)
Total Lymphocyte: 32.8 %
WBC mixed population: 422 cells/uL (ref 200–950)
WBC: 7.4 10*3/uL (ref 3.8–10.8)

## 2018-11-30 LAB — LIPID PANEL
Cholesterol: 249 mg/dL — ABNORMAL HIGH (ref <200)
HDL: 41 mg/dL — ABNORMAL LOW (ref >50)
NON-HDL CHOLESTEROL (CALC): 208 mg/dL — AB (ref <130)
TRIGLYCERIDES: 439 mg/dL — AB (ref <150)
Total CHOL/HDL Ratio: 6.1 (calc) — ABNORMAL HIGH (ref <5.0)

## 2018-11-30 LAB — TSH: TSH: 0.9 mIU/L (ref 0.40–4.50)

## 2018-11-30 LAB — MAGNESIUM: Magnesium: 2.1 mg/dL (ref 1.5–2.5)

## 2019-02-16 ENCOUNTER — Telehealth: Payer: Self-pay | Admitting: Physician Assistant

## 2019-02-16 MED ORDER — OSELTAMIVIR PHOSPHATE 75 MG PO CAPS: 75.0000 mg | ORAL_CAPSULE | Freq: Two times a day (BID) | ORAL | 0 refills | Status: AC

## 2019-02-16 NOTE — Telephone Encounter (Signed)
Patient has been informed of meds sent into pharmacy and patient voiced understanding. Feb 19th 2020

## 2019-02-16 NOTE — Telephone Encounter (Signed)
Patient with fever, chills, non productive cough x today, her son was diagnosed with the flu. Will send in tamiflu.

## 2019-04-20 ENCOUNTER — Telehealth: Payer: Medicare Other | Admitting: Physician Assistant

## 2019-04-20 DIAGNOSIS — B370 Candidal stomatitis: Secondary | ICD-10-CM | POA: Diagnosis not present

## 2019-04-20 MED ORDER — NYSTATIN 100000 UNIT/ML MT SUSP: OROMUCOSAL | 1 refills | Status: DC

## 2019-04-20 NOTE — Telephone Encounter (Signed)
Virtual Visit via Video Note  I connected with Alison Campbell on @TODAY @ at  by a video enabled telemedicine application and verified that I am speaking with the correct person using two identifiers.   I discussed the limitations of evaluation and management by telemedicine and the availability of in person appointments. The patient expressed understanding and agreed to proceed.  History of Present Illness: 73 y.o. smoking WF calls with "white tongue" no pain, she has used tongue scraper, hydrogen and peroxide and states this has helped some.  No trouble swallowing, no fever, chills.  No inhalers, no prednisone use.    Medications Current Outpatient Medications on File Prior to Visit  Medication Sig  . alendronate (FOSAMAX) 70 MG tablet TAKE 1 TABLET BY MOUTH EVERY WEEK ON AN EMPTY STOMACH WITH A FULL GLASS OF WATER FOR 1 HOUR  . ALPRAZolam (XANAX) 1 MG tablet 1/2 to 1 tablet 2 to 3 x daily as needed for anxiety or sleep  . aspirin 325 MG tablet Take 325 mg by mouth every 6 (six) hours as needed (pain).  Marland Kitchen aspirin 81 MG EC tablet Take 1 tablet daily  . atorvastatin (LIPITOR) 80 MG tablet Take 1/2 to 1 tablet daily or as directed for Cholesterol  . CALCIUM PO Take 2 tablets by mouth daily. 600 mg  2 per day  . Cholecalciferol (VITAMIN D PO) Take 2,000 Units by mouth daily. Take 5 caps to = 10,000 units daily.  . Cyanocobalamin (VITAMIN B 12 PO) Take 1 tablet by mouth daily.  . Multiple Vitamin (MULTIVITAMIN PO) Take 1 tablet by mouth daily.    No current facility-administered medications on file prior to visit.     Problem list She has Diverticulosis of large intestine; Colon polyp; Essential hypertension; Mixed hyperlipidemia; Abnormal glucose; Obstructive chronic bronchitis without exacerbation ([place]); Vitamin D deficiency; Medication management; Tobacco use disorder; Osteoporosis; and Aortic atherosclerosis ([place]) on their problem list.  Observations/Objective: General Appearance:Well  sounding, in no apparent distress.  ENT/Mouth: No hoarseness, No cough for duration of visit.  Respiratory: completing full sentences without distress, without audible wheeze Neuro: Awake and oriented X 3,  Psych:  Insight and Judgment appropriate.   Assessment and Plan: Diagnoses and all orders for this visit:  Thrush -     nystatin (MYCOSTATIN) 100000 UNIT/ML suspension; 5 ml four times a day, retain in mouth as long as possible (Swish and Spit).  Use for 48 hours after symptoms resolve.   No cause for the thrush that we can identify Will treat x 1 week, if not better with smoking history will follow up with a dentist or hear No fever, chills If any changes in symptoms will call the office or go to the ER  Follow Up Instructions:  I discussed the assessment and treatment plan with the patient. The patient was provided an opportunity to ask questions and all were answered. The patient agreed with the plan and demonstrated an understanding of the instructions.   The patient was advised to call back or seek an in-person evaluation if the symptoms worsen or if the condition fails to improve as anticipated.  I provided 15 minutes of non-face-to-face time during this encounter.    Vicie Mutters, PA-C

## 2019-04-20 NOTE — Telephone Encounter (Signed)
-----   Message from Elenor Quinones, [place] sent at 04/20/2019  9:17 AM EDT ----- Regarding: poss thrush Contact: [phone] Patient thinks she has thrush of mouth   Pharmacy: [facility]/ [address]

## 2019-05-15 ENCOUNTER — Encounter: Payer: Self-pay | Admitting: Internal Medicine

## 2019-05-15 NOTE — Patient Instructions (Signed)
>>>>>>>>>>>>>>>>>>>>>>>>>>>>>>>>>>>>>>>>>>>>>>>>>>>>>>> Coronavirus (COVID-19) Are you at risk?  Are you at risk for the Coronavirus (COVID-19)?  To be considered HIGH RISK for Coronavirus (COVID-19), you have to meet the following criteria:  . Traveled to [place], [place], [place], [place] or [place]; or in the [place] to [place], [place], [place]  . or [place]; and have fever, cough, and shortness of breath within the last 2 weeks of travel OR . Been in close contact with a person diagnosed with COVID-19 within the last 2 weeks and have  . fever, cough,and shortness of breath .  . IF YOU DO NOT MEET THESE CRITERIA, YOU ARE CONSIDERED LOW RISK FOR COVID-19.  What to do if you are HIGH RISK for COVID-19?  Marland Kitchen If you are having a medical emergency, call 911. . Seek medical care right away. Before you go to a doctor's office, urgent care or emergency department, .  call ahead and tell them about your recent travel, contact with someone diagnosed with COVID-19  .  and your symptoms.  . You should receive instructions from your physician's office regarding next steps of care.  . When you arrive at healthcare provider, tell the healthcare staff immediately you have returned from  . visiting [place], [place], [place], [place] or [place]; or traveled in the [place] to [place], [place],  . [place] or [place] in the last two weeks or you have been in close contact with a person diagnosed with  . COVID-19 in the last 2 weeks.   . Tell the health care staff about your symptoms: fever, cough and shortness of breath. . After you have been seen by a medical provider, you will be either: o Tested for (COVID-19) and discharged home on quarantine except to seek medical care if  o symptoms worsen, and asked to  - Stay home and avoid contact with others until you get your results (4-5 days)  - Avoid travel on public transportation if possible (such as bus, train, or airplane)  or o Sent to the Emergency Department by EMS for evaluation, COVID-19 testing  and  o possible admission depending on your condition and test results.  What to do if you are LOW RISK for COVID-19?  Reduce your risk of any infection by using the same precautions used for avoiding the common cold or flu:  Marland Kitchen Wash your hands often with soap and warm water for at least 20 seconds.  If soap and water are not readily available,  . use an alcohol-based hand sanitizer with at least 60% alcohol.  . If coughing or sneezing, cover your mouth and nose by coughing or sneezing into the elbow areas of your shirt or coat, .  into a tissue or into your sleeve (not your hands). . Avoid shaking hands with others and consider head nods or verbal greetings only. . Avoid touching your eyes, nose, or mouth with unwashed hands.  . Avoid close contact with people who are sick. . Avoid places or events with large numbers of people in one location, like concerts or sporting events. . Carefully consider travel plans you have or are making. . If you are planning any travel outside or inside the [place], visit the CDC's Travelers' Health webpage for the latest health notices. . If you have some symptoms but not all symptoms, continue to monitor at home and seek medical attention  . if your symptoms worsen. . If you are having a medical emergency, call 911.   . >>>>>>>>>>>>>>>>>>>>>>>>>>>>>>>>> .  We Do NOT Approve of  [facility], [employer]  Our Patients  To Do Home Visits & We Do NOT Approve of [employer] > > > > > > > > > > > > > > > > > > > > > > > > > > > > > > > > > > > > > > >  Preventive Care for Adults  A healthy lifestyle and preventive care can promote health and wellness. Preventive health guidelines for women include the following key practices.  A routine yearly physical is a good way to check with your health care provider about your health and preventive screening. It is a  chance to share any concerns and updates on your health and to receive a thorough exam.  Visit your dentist for a routine exam and preventive care every 6 months. Brush your teeth twice a day and floss once a day. Good oral hygiene prevents tooth decay and gum disease.  The frequency of eye exams is based on your age, health, family medical history, use of contact lenses, and other factors. Follow your health care provider's recommendations for frequency of eye exams.  Eat a healthy diet. Foods like vegetables, fruits, whole grains, low-fat dairy products, and lean protein foods contain the nutrients you need without too many calories. Decrease your intake of foods high in solid fats, added sugars, and salt. Eat the right amount of calories for you. Get information about a proper diet from your health care provider, if necessary.  Regular physical exercise is one of the most important things you can do for your health. Most adults should get at least 150 minutes of moderate-intensity exercise (any activity that increases your heart rate and causes you to sweat) each week. In addition, most adults need muscle-strengthening exercises on 2 or more days a week.  Maintain a healthy weight. The body mass index (BMI) is a screening tool to identify possible weight problems. It provides an estimate of body fat based on height and weight. Your health care provider can find your BMI and can help you achieve or maintain a healthy weight. For adults 20 years and older:  A BMI below 18.5 is considered underweight.  A BMI of 18.5 to 24.9 is normal.  A BMI of 25 to 29.9 is considered overweight.  A BMI of 30 and above is considered obese.  Maintain normal blood lipids and cholesterol levels by exercising and minimizing your intake of saturated fat. Eat a balanced diet with plenty of fruit and vegetables. If your lipid or cholesterol levels are high, you are over 50, or you are at high risk for heart disease,  you may need your cholesterol levels checked more frequently. Ongoing high lipid and cholesterol levels should be treated with medicines if diet and exercise are not working.  If you smoke, find out from your health care provider how to quit. If you do not use tobacco, do not start.  Lung cancer screening is recommended for adults aged 60-80 years who are at high risk for developing lung cancer because of a history of smoking. A yearly low-dose CT scan of the lungs is recommended for people who have at least a 30-pack-year history of smoking and are a current smoker or have quit within the past 15 years. A pack year of smoking is smoking an average of 1 pack of cigarettes a day for 1 year (for example: 1 pack a day for 30 years or 2 packs a  day for 15 years). Yearly screening should continue until the smoker has stopped smoking for at least 15 years. Yearly screening should be stopped for people who develop a health problem that would prevent them from having lung cancer treatment.  Avoid use of street drugs. Do not share needles with anyone. Ask for help if you need support or instructions about stopping the use of drugs.  High blood pressure causes heart disease and increases the risk of stroke.  Ongoing high blood pressure should be treated with medicines if weight loss and exercise do not work.  If you are 16-48 years old, ask your health care provider if you should take aspirin to prevent strokes.  Diabetes screening involves taking a blood sample to check your fasting blood sugar level. This should be done once every 3 years, after age 8, if you are within normal weight and without risk factors for diabetes. Testing should be considered at a younger age or be carried out more frequently if you are overweight and have at least 1 risk factor for diabetes.  Breast cancer screening is essential preventive care for women. You should practice "breast self-awareness." This means understanding the  normal appearance and feel of your breasts and may include breast self-examination. Any changes detected, no matter how small, should be reported to a health care provider. Women in their 28s and 30s should have a clinical breast exam (CBE) by a health care provider as part of a regular health exam every 1 to 3 years. After age 45, women should have a CBE every year. Starting at age 38, women should consider having a mammogram (breast X-ray test) every year. Women who have a family history of breast cancer should talk to their health care provider about genetic screening. Women at a high risk of breast cancer should talk to their health care providers about having an MRI and a mammogram every year.  Breast cancer gene (BRCA)-related cancer risk assessment is recommended for women who have family members with BRCA-related cancers. BRCA-related cancers include breast, ovarian, tubal, and peritoneal cancers. Having family members with these cancers may be associated with an increased risk for harmful changes (mutations) in the breast cancer genes BRCA1 and BRCA2. Results of the assessment will determine the need for genetic counseling and BRCA1 and BRCA2 testing.  Routine pelvic exams to screen for cancer are no longer recommended for nonpregnant women who are considered low risk for cancer of the pelvic organs (ovaries, uterus, and vagina) and who do not have symptoms. Ask your health care provider if a screening pelvic exam is right for you.  If you have had past treatment for cervical cancer or a condition that could lead to cancer, you need Pap tests and screening for cancer for at least 20 years after your treatment. If Pap tests have been discontinued, your risk factors (such as having a new sexual partner) need to be reassessed to determine if screening should be resumed. Some women have medical problems that increase the chance of getting cervical cancer. In these cases, your health care provider may  recommend more frequent screening and Pap tests.    Colorectal cancer can be detected and often prevented. Most routine colorectal cancer screening begins at the age of 69 years and continues through age 55 years. However, your health care provider may recommend screening at an earlier age if you have risk factors for colon cancer. On a yearly basis, your health care provider may provide home test kits to  check for hidden blood in the stool. Use of a small camera at the end of a tube, to directly examine the colon (sigmoidoscopy or colonoscopy), can detect the earliest forms of colorectal cancer. Talk to your health care provider about this at age 50, when routine screening begins.  Direct exam of the colon should be repeated every 5-10 years through age 75 years, unless early forms of pre-cancerous polyps or small growths are found.  Osteoporosis is a disease in which the bones lose minerals and strength with aging. This can result in serious bone fractures or breaks. The risk of osteoporosis can be identified using a bone density scan. Women ages 65 years and over and women at risk for fractures or osteoporosis should discuss screening with their health care providers. Ask your health care provider whether you should take a calcium supplement or vitamin D to reduce the rate of osteoporosis.  Menopause can be associated with physical symptoms and risks. Hormone replacement therapy is available to decrease symptoms and risks. You should talk to your health care provider about whether hormone replacement therapy is right for you.  Use sunscreen. Apply sunscreen liberally and repeatedly throughout the day. You should seek shade when your shadow is shorter than you. Protect yourself by wearing long sleeves, pants, a wide-brimmed hat, and sunglasses year round, whenever you are outdoors.  Once a month, do a whole body skin exam, using a mirror to look at the skin on your back. Tell your health care provider  of new moles, moles that have irregular borders, moles that are larger than a pencil eraser, or moles that have changed in shape or color.  Stay current with required vaccines (immunizations).  Influenza vaccine. All adults should be immunized every year.  Tetanus, diphtheria, and acellular pertussis (Td, Tdap) vaccine. Pregnant women should receive 1 dose of Tdap vaccine during each pregnancy. The dose should be obtained regardless of the length of time since the last dose. Immunization is preferred during the 27th-36th week of gestation. An adult who has not previously received Tdap or who does not know her vaccine status should receive 1 dose of Tdap. This initial dose should be followed by tetanus and diphtheria toxoids (Td) booster doses every 10 years. Adults with an unknown or incomplete history of completing a 3-dose immunization series with Td-containing vaccines should begin or complete a primary immunization series including a Tdap dose. Adults should receive a Td booster every 10 years.    Zoster vaccine. One dose is recommended for adults aged 60 years or older unless certain conditions are present.    Pneumococcal 13-valent conjugate (PCV13) vaccine. When indicated, a person who is uncertain of her immunization history and has no record of immunization should receive the PCV13 vaccine. An adult aged 19 years or older who has certain medical conditions and has not been previously immunized should receive 1 dose of PCV13 vaccine. This PCV13 should be followed with a dose of pneumococcal polysaccharide (PPSV23) vaccine. The PPSV23 vaccine dose should be obtained at least 1 or more year(s) after the dose of PCV13 vaccine. An adult aged 19 years or older who has certain medical conditions and previously received 1 or more doses of PPSV23 vaccine should receive 1 dose of PCV13. The PCV13 vaccine dose should be obtained 1 or more years after the last PPSV23 vaccine dose.    Pneumococcal  polysaccharide (PPSV23) vaccine. When PCV13 is also indicated, PCV13 should be obtained first. All adults aged 65 years and older   should be immunized. An adult younger than age 58 years who has certain medical conditions should be immunized. Any person who resides in a nursing home or long-term care facility should be immunized. An adult smoker should be immunized. People with an immunocompromised condition and certain other conditions should receive both PCV13 and PPSV23 vaccines. People with human immunodeficiency virus (HIV) infection should be immunized as soon as possible after diagnosis. Immunization during chemotherapy or radiation therapy should be avoided. Routine use of PPSV23 vaccine is not recommended for American Indians, [place] Natives, or people younger than 65 years unless there are medical conditions that require PPSV23 vaccine. When indicated, people who have unknown immunization and have no record of immunization should receive PPSV23 vaccine. One-time revaccination 5 years after the first dose of PPSV23 is recommended for people aged 19-64 years who have chronic kidney failure, nephrotic syndrome, asplenia, or immunocompromised conditions. People who received 1-2 doses of PPSV23 before age 82 years should receive another dose of PPSV23 vaccine at age 2 years or later if at least 5 years have passed since the previous dose. Doses of PPSV23 are not needed for people immunized with PPSV23 at or after age 9 years.   Preventive Services / Frequency  Ages 40 years and over  Blood pressure check.  Lipid and cholesterol check.  Lung cancer screening. / Every year if you are aged 49-80 years and have a 30-pack-year history of smoking and currently smoke or have quit within the past 15 years. Yearly screening is stopped once you have quit smoking for at least 15 years or develop a health problem that would prevent you from having lung cancer treatment.  Clinical breast exam.** / Every year  after age 53 years.   BRCA-related cancer risk assessment.** / For women who have family members with a BRCA-related cancer (breast, ovarian, tubal, or peritoneal cancers).  Mammogram.** / Every year beginning at age 56 years and continuing for as long as you are in good health. Consult with your health care provider.  Pap test.** / Every 3 years starting at age 73 years through age 8 or 48 years with 3 consecutive normal Pap tests. Testing can be stopped between 65 and 70 years with 3 consecutive normal Pap tests and no abnormal Pap or HPV tests in the past 10 years.  Fecal occult blood test (FOBT) of stool. / Every year beginning at age 58 years and continuing until age 53 years. You may not need to do this test if you get a colonoscopy every 10 years.  Flexible sigmoidoscopy or colonoscopy.** / Every 5 years for a flexible sigmoidoscopy or every 10 years for a colonoscopy beginning at age 35 years and continuing until age 78 years.  Hepatitis C blood test.** / For all people born from [birth year] through 1967 and any individual with known risks for hepatitis C.  Osteoporosis screening.** / A one-time screening for women ages 54 years and over and women at risk for fractures or osteoporosis.  Skin self-exam. / Monthly.  Influenza vaccine. / Every year.  Tetanus, diphtheria, and acellular pertussis (Tdap/Td) vaccine.** / 1 dose of Td every 10 years.  Zoster vaccine.** / 1 dose for adults aged 48 years or older.  Pneumococcal 13-valent conjugate (PCV13) vaccine.** / Consult your health care provider.  Pneumococcal polysaccharide (PPSV23) vaccine.** / 1 dose for all adults aged 14 years and older. Screening for abdominal aortic aneurysm (AAA)  by ultrasound is recommended for people who have history of high blood pressure  or who are current or former smokers. ++++++++++++++++++++ Recommend Adult Low Dose Aspirin or  coated  Aspirin 81 mg daily  To reduce risk of Colon Cancer 20 %,  Skin  Cancer 26 % ,  Melanoma 46%  and  Pancreatic cancer 60% ++++++++++++++++++++ Vitamin D goal  is between 70-100.  Please make sure that you are taking your Vitamin D as directed.  It is very important as a natural anti-inflammatory  helping hair, skin, and nails, as well as reducing stroke and heart attack risk.  It helps your bones and helps with mood. It also decreases numerous cancer risks so please take it as directed.  Low Vit D is associated with a 200-300% higher risk for CANCER  and 200-300% higher risk for HEART   ATTACK  &  STROKE.   .....................................Marland Kitchen It is also associated with higher death rate at younger ages,  autoimmune diseases like Rheumatoid arthritis, Lupus, Multiple Sclerosis.    Also many other serious conditions, like depression, Alzheimer's Dementia, infertility, muscle aches, fatigue, fibromyalgia - just to name a few. ++++++++++++++++++ Recommend the book "The END of DIETING" by Dr Excell Seltzer  & the book "The END of DIABETES " by Dr Excell Seltzer At [email or web address] - get book & Audio CD's    Being diabetic has a  300% increased risk for heart attack, stroke, cancer, and alzheimer- type vascular dementia. It is very important that you work harder with diet by avoiding all foods that are white. Avoid white rice (brown & wild rice is OK), white potatoes (sweetpotatoes in moderation is OK), White bread or wheat bread or anything made out of white flour like bagels, donuts, rolls, buns, biscuits, cakes, pastries, cookies, pizza crust, and pasta (made from white flour & egg whites) - vegetarian pasta or spinach or wheat pasta is OK. Multigrain breads like Arnold's or Pepperidge [employer], or multigrain sandwich thins or flatbreads.  Diet, exercise and weight loss can reverse and cure diabetes in the early stages.  Diet, exercise and weight loss is very important in the control and prevention of complications of diabetes which affects every system in your body, ie.  Brain - dementia/stroke, eyes - glaucoma/blindness, heart - heart attack/heart failure, kidneys - dialysis, stomach - gastric paralysis, intestines - malabsorption, nerves - severe painful neuritis, circulation - gangrene & loss of a leg(s), and finally cancer and Alzheimers.    I recommend avoid fried & greasy foods,  sweets/candy, white rice (brown or wild rice or Quinoa is OK), white potatoes (sweet potatoes are OK) - anything made from white flour - bagels, doughnuts, rolls, buns, biscuits,white and wheat breads, pizza crust and traditional pasta made of white flour & egg white(vegetarian pasta or spinach or wheat pasta is OK).  Multi-grain bread is OK - like multi-grain flat bread or sandwich thins. Avoid alcohol in excess. Exercise is also important.    Eat all the vegetables you want - avoid meat, especially red meat and dairy - especially cheese.  Cheese is the most concentrated form of trans-fats which is the worst thing to clog up our arteries. Veggie cheese is OK which can be found in the fresh produce section at [employer] or [employer] or [employer]  +++++++++++++++++++ DASH Eating Plan  DASH stands for "Dietary Approaches to Stop Hypertension."   The DASH eating plan is a healthy eating plan that has been shown to reduce high blood pressure (hypertension). Additional health benefits may include reducing the risk of type 2 diabetes mellitus, heart  disease, and stroke. The DASH eating plan may also help with weight loss. WHAT DO I NEED TO KNOW ABOUT THE DASH EATING PLAN? For the DASH eating plan, you will follow these general guidelines:  Choose foods with a percent daily value for sodium of less than 5% (as listed on the food label).  Use salt-free seasonings or herbs instead of table salt or sea salt.  Check with your health care provider or pharmacist before using salt substitutes.  Eat lower-sodium products, often labeled as "lower sodium" or "no salt added."  Eat fresh  foods.  Eat more vegetables, fruits, and low-fat dairy products.  Choose whole grains. Look for the word "whole" as the first word in the ingredient list.  Choose fish   Limit sweets, desserts, sugars, and sugary drinks.  Choose heart-healthy fats.  Eat veggie cheese   Eat more home-cooked food and less restaurant, buffet, and fast food.  Limit fried foods.  Cook foods using methods other than frying.  Limit canned vegetables. If you do use them, rinse them well to decrease the sodium.  When eating at a restaurant, ask that your food be prepared with less salt, or no salt if possible.                      WHAT FOODS CAN I EAT? Read Dr Fara Olden Fuhrman's books on The End of Dieting & The End of Diabetes  Grains Whole grain or whole wheat bread. Brown rice. Whole grain or whole wheat pasta. Quinoa, bulgur, and whole grain cereals. Low-sodium cereals. Corn or whole wheat flour tortillas. Whole grain cornbread. Whole grain crackers. Low-sodium crackers.  Vegetables Fresh or frozen vegetables (raw, steamed, roasted, or grilled). Low-sodium or reduced-sodium tomato and vegetable juices. Low-sodium or reduced-sodium tomato sauce and paste. Low-sodium or reduced-sodium canned vegetables.   Fruits All fresh, canned (in natural juice), or frozen fruits.  Protein Products  All fish and seafood.  Dried beans, peas, or lentils. Unsalted nuts and seeds. Unsalted canned beans.  Dairy Low-fat dairy products, such as skim or 1% milk, 2% or reduced-fat cheeses, low-fat ricotta or cottage cheese, or plain low-fat yogurt. Low-sodium or reduced-sodium cheeses.  Fats and Oils Tub margarines without trans fats. Light or reduced-fat mayonnaise and salad dressings (reduced sodium). Avocado. Safflower, olive, or canola oils. Natural peanut or almond butter.  Other Unsalted popcorn and pretzels. The items listed above may not be a complete list of recommended foods or beverages. Contact your  dietitian for more options.  +++++++++++++++  WHAT FOODS ARE NOT RECOMMENDED? Grains/ White flour or wheat flour White bread. White pasta. White rice. Refined cornbread. Bagels and croissants. Crackers that contain trans fat.  Vegetables  Creamed or fried vegetables. Vegetables in a . Regular canned vegetables. Regular canned tomato sauce and paste. Regular tomato and vegetable juices.  Fruits Dried fruits. Canned fruit in light or heavy syrup. Fruit juice.  Meat and Other Protein Products Meat in general - RED meat & White meat.  Fatty cuts of meat. Ribs, chicken wings, all processed meats as bacon, sausage, bologna, salami, fatback, hot dogs, bratwurst and packaged luncheon meats.  Dairy Whole or 2% milk, cream, half-and-half, and cream cheese. Whole-fat or sweetened yogurt. Full-fat cheeses or blue cheese. Non-dairy creamers and whipped toppings. Processed cheese, cheese spreads, or cheese curds.  Condiments Onion and garlic salt, seasoned salt, table salt, and sea salt. Canned and packaged gravies. Worcestershire sauce. Tartar sauce. Barbecue sauce. Teriyaki sauce. Soy sauce, including  reduced sodium. Steak sauce. Fish sauce. Oyster sauce. Cocktail sauce. Horseradish. Ketchup and mustard. Meat flavorings and tenderizers. Bouillon cubes. Hot sauce. Tabasco sauce. Marinades. Taco seasonings. Relishes.  Fats and Oils Butter, stick margarine, lard, shortening and bacon fat. Coconut, palm kernel, or palm oils. Regular salad dressings.  Pickles and olives. Salted popcorn and pretzels.  The items listed above may not be a complete list of foods and beverages to avoid.

## 2019-05-15 NOTE — Progress Notes (Signed)
[name] [name], M.D.     Alison Campbell. Alison Campbell, P.A.-C [name], [place], [place] [address] Telephone [phone] Telefax [phone] Comprehensive Evaluation &  Examination  History of Present Illness:     This very nice 73 y.o. MWF presents for a  comprehensive evaluation and management of multiple medical co-morbidities.  Patient has been followed for HTN, HLD, Prediabetes  and Vitamin D Deficiency.     Patient has COPD consequent of a 50+ yr smoking hx. The last 2 years,  she had negative LD screening Chest CT's . Unfortunately, she continues to smoke. She was agreeable to undergo another screening low dose CT scan of the chest.  We discussed smoking cessation techniques/options.      Patient has been followed for years with labile HTN predates. Patient's BP has been controlled at home and patient denies any cardiac symptoms as chest pain, palpitations, shortness of breath, dizziness or ankle swelling.  She had a negative stress Myoview (EF 65%) in Mar 2019. Today's BP: 128/74 is at goal.     Patient's hyperlipidemia is not controlled with diet and Atorvastatin. Patient denies myalgias or other medication SE's. Last lipids were not at goal: Lab Results  Component Value Date   CHOL 249 (H) 11/29/2018   HDL 41 (L) 11/29/2018   LDLCALC not calculated 11/29/2018   TRIG 439 (H) 11/29/2018   CHOLHDL 6.1 (H) 11/29/2018      Patient has hx/o prediabetes (A1c 6.0% / 2011) and patient denies reactive hypoglycemic symptoms, visual blurring, diabetic polys or paresthesias. Last A1c was Normal & at goal: Lab Results  Component Value Date   HGBA1C 5.5 04/19/2018      Finally, patient has history of Vitamin D Deficiency ("35" / 2008)  and last Vitamin D was at goal: Lab Results  Component Value Date   VD25OH 71 04/19/2018   Current Outpatient Medications on File Prior to Visit   Medication Sig  . alendronate (FOSAMAX) 70 MG tablet TAKE 1 TABLET BY MOUTH EVERY WEEK ON AN EMPTY STOMACH WITH A FULL GLASS OF WATER FOR 1 HOUR  . ALPRAZolam (XANAX) 1 MG tablet 1/2 to 1 tablet 2 to 3 x daily as needed for anxiety or sleep  . aspirin 81 MG EC tablet Take 1 tablet daily  . CALCIUM PO Take 2 tablets by mouth daily. 600 mg  2 per day  . Cholecalciferol (VITAMIN D PO) Take 2,000 Units by mouth daily. Take 5 caps to = 10,000 units daily.  . Cyanocobalamin (VITAMIN B 12 PO) Take 1 tablet by mouth daily.  . Multiple Vitamin (MULTIVITAMIN PO) Take 1 tablet by mouth daily.    No current facility-administered medications on file prior to visit.    Allergies  Allergen Reactions  . Fish Oil Diarrhea   Past Medical History:  Diagnosis Date  . Anxiety   . Colon polyps   . GERD (gastroesophageal reflux disease)    coffee related  . Hyperlipidemia   . Insomnia   . Kidney stones   . Osteoporosis   . Pre-diabetes    Health Maintenance  Topic Date Due  . Hepatitis C Screening  1946/09/12  . COLONOSCOPY  09/09/2014  . TETANUS/TDAP  02/27/2019  . MAMMOGRAM  05/16/2019  . INFLUENZA VACCINE  07/30/2019  . DEXA SCAN  Completed  . PNA vac Low Risk Adult  Completed   Immunization History  Administered Date(s) Administered  .  Influenza, High Dose Seasonal PF 08/29/2013, 10/25/2015, 11/24/2017, 11/29/2018  . Pneumococcal Conjugate-13 02/02/2015  . Pneumococcal Polysaccharide-23 03/29/2010  . Tdap 02/26/2009  . Zoster 12/29/2008   Last Colon - 09/10/2011 - Dr Sharlett Iles Adenomatous polyp(s) - recc 3 year f/u - overdue Sept 2015  / Sent a letter July 2015 by Dr Ardis Hughs  Last MGM - 03/17/2018  Past Surgical History:  Procedure Laterality Date  . ABDOMINAL HYSTERECTOMY    . COLONOSCOPY    . hysterectomy    . POLYPECTOMY    . UVULECTOMY N/A 09/09/2016   Procedure: PARTIAL UVULECTOMY, direct laryngoscopy;  Surgeon: Rozetta Nunnery, MD;  Location: [facility];   Service: ENT;  Laterality: N/A;  PARTIAL UVULECTOMY, direct laryngoscopy   Family History  Problem Relation Age of Onset  . Heart disease Mother   . Breast cancer Mother   . Heart disease Father   . Diabetes Father   . Cancer Brother        Lung  . Bipolar disorder Daughter   . Colon cancer Neg Hx   . Esophageal cancer Neg Hx   . Stomach cancer Neg Hx    Social History   Tobacco Use  . Smoking status: Current Every Day Smoker    Packs/day: 0.50    Years: 50.00    Pack years: 25.00  . Smokeless tobacco: Never Used  Substance Use Topics  . Alcohol use: No    Alcohol/week: 0.0 standard drinks  . Drug use: No    ROS Constitutional: Denies fever, chills, weight loss/gain, headaches, insomnia,  night sweats, and change in appetite. Does c/o fatigue. Eyes: Denies redness, blurred vision, diplopia, discharge, itchy, watery eyes.  ENT: Denies discharge, congestion, post nasal drip, epistaxis, sore throat, earache, hearing loss, dental pain, Tinnitus, Vertigo, Sinus pain, snoring.  Cardio: Denies chest pain, palpitations, irregular heartbeat, syncope, dyspnea, diaphoresis, orthopnea, PND, claudication, edema Respiratory: denies cough, dyspnea, DOE, pleurisy, hoarseness, laryngitis, wheezing.  Gastrointestinal: Denies dysphagia, heartburn, reflux, water brash, pain, cramps, nausea, vomiting, bloating, diarrhea, constipation, hematemesis, melena, hematochezia, jaundice, hemorrhoids Genitourinary: Denies dysuria, frequency, urgency, nocturia, hesitancy, discharge, hematuria, flank pain Breast: Breast lumps, nipple discharge, bleeding.  Musculoskeletal: Denies arthralgia, myalgia, stiffness, Jt. Swelling, pain, limp, and strain/sprain. Denies falls. Skin: Denies puritis, rash, hives, warts, acne, eczema, changing in skin lesion Neuro: No weakness, tremor, incoordination, spasms, paresthesia, pain Psychiatric: Denies confusion, memory loss, sensory loss. Denies Depression. Endocrine: Denies  change in weight, skin, hair change, nocturia, and paresthesia, diabetic polys, visual blurring, hyper / hypo glycemic episodes.  Heme/Lymph: No excessive bleeding, bruising, enlarged lymph nodes.  Physical Exam  BP 128/74   Pulse 75   Temp 97.7 F (36.5 C)   Ht 5' 3.5" (1.613 m)   Wt 146 lb 9.6 oz (66.5 kg)   SpO2 96%   BMI 25.56 kg/m   General Appearance: Well nourished, well groomed and in no apparent distress.  Eyes: PERRLA, EOMs, conjunctiva no swelling or erythema, normal fundi and vessels. Sinuses: No frontal/maxillary tenderness ENT/Mouth: EACs patent / TMs  nl. Nares clear without erythema, swelling, mucoid exudates. Oral hygiene is good. No erythema, swelling, or exudate. Tongue normal, non-obstructing. Tonsils not swollen or erythematous. Hearing normal.  Neck: Supple, thyroid not palpable. No bruits, nodes or JVD. Respiratory: Respiratory effort normal.  BS equal and clear bilateral without rales, rhonci, wheezing or stridor. Cardio: Heart sounds are normal with regular rate and rhythm and no murmurs, rubs or gallops. Peripheral pulses are normal and equal bilaterally  without edema. No aortic or femoral bruits. Chest: symmetric with normal excursions and percussion. Breasts: Symmetric, without lumps, nipple discharge, retractions, or fibrocystic changes.  Abdomen: Flat, soft with bowel sounds active. Nontender, no guarding, rebound, hernias, masses, or organomegaly.  Lymphatics: Non tender without lymphadenopathy.  Genitourinary:  Musculoskeletal: Full ROM all peripheral extremities, joint stability, 5/5 strength, and normal gait. Skin: Warm and dry without rashes, lesions, cyanosis, clubbing or  ecchymosis.  Neuro: Cranial nerves intact, reflexes equal bilaterally. Normal muscle tone, no cerebellar symptoms. Sensation intact.  Pysch: Alert and oriented X 3, normal affect, Insight and Judgment appropriate.   Assessment and Plan  1. Essential hypertension  - EKG  12-Lead - Urinalysis, Routine w reflex microscopic - Microalbumin / creatinine urine ratio - CBC with Differential/Platelet - COMPLETE METABOLIC PANEL WITH GFR - Magnesium - TSH  2. Hyperlipidemia, mixed  - EKG 12-Lead - Lipid panel - TSH  3. Abnormal glucose  - EKG 12-Lead - Hemoglobin A1c - Insulin, random  4. Vitamin D deficiency  - VITAMIN D 25 Hydroxy (Vit-D Deficiency, Fractures)  5. Prediabetes  - Hemoglobin A1c - Insulin, random  6. Chronic bronchitis ([place])   7. Nicotine use disorder   8. Screening for ischemic heart disease  - EKG 12-Lead  9. FHx: heart disease  - EKG 12-Lead  10. Aortic atherosclerosis (HCC)  - EKG 12-Lead  11. Polyp of colon  - Ambulatory referral to Gastroenterology  12. Encounter for screening for malignant neoplasm of respiratory organs  - CT CHEST LUNG CA SCREEN LOW DOSE W/O CM; Future  13. Medication management  - Urinalysis, Routine w reflex microscopic - Microalbumin / creatinine urine ratio - CBC with Differential/Platelet - COMPLETE METABOLIC PANEL WITH GFR - Magnesium - Lipid panel - TSH - Hemoglobin A1c - Insulin, random - VITAMIN D 25 Hydroxyl       Patient was counseled in prudent diet to achieve/maintain BMI less than 25 for weight control, BP monitoring, regular exercise and medications. Discussed med's effects and SE's. Screening labs and tests as requested with regular follow-up as recommended. I discussed the assessment and treatment plan as above with the patient. The patient was provided an opportunity to ask questions and all were answered. The patient agreed with the plan and demonstrated an understanding of the instructions.  I provided over 40 minutes of exam, counseling, chart review and  complex critical decision making was performed        Kirtland Bouchard, MD

## 2019-05-16 ENCOUNTER — Other Ambulatory Visit: Payer: Self-pay

## 2019-05-16 ENCOUNTER — Encounter: Payer: Self-pay | Admitting: Internal Medicine

## 2019-05-16 ENCOUNTER — Ambulatory Visit (INDEPENDENT_AMBULATORY_CARE_PROVIDER_SITE_OTHER): Payer: Medicare Other | Admitting: Internal Medicine

## 2019-05-16 VITALS — BP 128/74 | HR 75 | Temp 97.7°F | Ht 63.5 in | Wt 146.6 lb

## 2019-05-16 DIAGNOSIS — I700 Atherosclerosis of aorta: Secondary | ICD-10-CM

## 2019-05-16 DIAGNOSIS — E782 Mixed hyperlipidemia: Secondary | ICD-10-CM | POA: Diagnosis not present

## 2019-05-16 DIAGNOSIS — Z79899 Other long term (current) drug therapy: Secondary | ICD-10-CM

## 2019-05-16 DIAGNOSIS — Z8249 Family history of ischemic heart disease and other diseases of the circulatory system: Secondary | ICD-10-CM

## 2019-05-16 DIAGNOSIS — R7303 Prediabetes: Secondary | ICD-10-CM | POA: Diagnosis not present

## 2019-05-16 DIAGNOSIS — J42 Unspecified chronic bronchitis: Secondary | ICD-10-CM

## 2019-05-16 DIAGNOSIS — I10 Essential (primary) hypertension: Secondary | ICD-10-CM

## 2019-05-16 DIAGNOSIS — R7309 Other abnormal glucose: Secondary | ICD-10-CM

## 2019-05-16 DIAGNOSIS — F17200 Nicotine dependence, unspecified, uncomplicated: Secondary | ICD-10-CM | POA: Diagnosis not present

## 2019-05-16 DIAGNOSIS — E559 Vitamin D deficiency, unspecified: Secondary | ICD-10-CM | POA: Diagnosis not present

## 2019-05-16 DIAGNOSIS — Z122 Encounter for screening for malignant neoplasm of respiratory organs: Secondary | ICD-10-CM

## 2019-05-16 DIAGNOSIS — Z136 Encounter for screening for cardiovascular disorders: Secondary | ICD-10-CM | POA: Diagnosis not present

## 2019-05-16 DIAGNOSIS — K635 Polyp of colon: Secondary | ICD-10-CM

## 2019-05-17 ENCOUNTER — Other Ambulatory Visit: Payer: Self-pay | Admitting: *Deleted

## 2019-05-17 ENCOUNTER — Other Ambulatory Visit: Payer: Self-pay | Admitting: Internal Medicine

## 2019-05-17 ENCOUNTER — Encounter: Payer: Self-pay | Admitting: Gastroenterology

## 2019-05-17 DIAGNOSIS — Z1231 Encounter for screening mammogram for malignant neoplasm of breast: Secondary | ICD-10-CM

## 2019-05-17 LAB — CBC WITH DIFFERENTIAL/PLATELET
Absolute Monocytes: 510 cells/uL (ref 200–950)
Basophils Absolute: 57 cells/uL (ref 0–200)
Basophils Relative: 0.7 %
Eosinophils Absolute: 518 cells/uL — ABNORMAL HIGH (ref 15–500)
Eosinophils Relative: 6.4 %
HCT: 41.2 % (ref 35.0–45.0)
Hemoglobin: 13.9 g/dL (ref 11.7–15.5)
Lymphs Abs: 2041 cells/uL (ref 850–3900)
MCH: 31.4 pg (ref 27.0–33.0)
MCHC: 33.7 g/dL (ref 32.0–36.0)
MCV: 93 fL (ref 80.0–100.0)
MPV: 11.3 fL (ref 7.5–12.5)
Monocytes Relative: 6.3 %
Neutro Abs: 4973 cells/uL (ref 1500–7800)
Neutrophils Relative %: 61.4 %
Platelets: 294 10*3/uL (ref 140–400)
RBC: 4.43 10*6/uL (ref 3.80–5.10)
RDW: 12.3 % (ref 11.0–15.0)
Total Lymphocyte: 25.2 %
WBC: 8.1 10*3/uL (ref 3.8–10.8)

## 2019-05-17 LAB — COMPLETE METABOLIC PANEL WITH GFR
AG Ratio: 1.8 (calc) (ref 1.0–2.5)
ALT: 22 U/L (ref 6–29)
AST: 18 U/L (ref 10–35)
Albumin: 4.4 g/dL (ref 3.6–5.1)
Alkaline phosphatase (APISO): 53 U/L (ref 37–153)
BUN/Creatinine Ratio: 20 (calc) (ref 6–22)
BUN: 20 mg/dL (ref 7–25)
CO2: 29 mmol/L (ref 20–32)
Calcium: 9.7 mg/dL (ref 8.6–10.4)
Chloride: 109 mmol/L (ref 98–110)
Creat: 1.02 mg/dL — ABNORMAL HIGH (ref 0.60–0.93)
GFR, Est African American: 63 mL/min/{1.73_m2} (ref >=60)
GFR, Est Non African American: 54 mL/min/{1.73_m2} — ABNORMAL LOW (ref >=60)
Globulin: 2.4 g/dL (calc) (ref 1.9–3.7)
Glucose, Bld: 82 mg/dL (ref 65–99)
Potassium: 4.4 mmol/L (ref 3.5–5.3)
Sodium: 142 mmol/L (ref 135–146)
Total Bilirubin: 0.3 mg/dL (ref 0.2–1.2)
Total Protein: 6.8 g/dL (ref 6.1–8.1)

## 2019-05-17 LAB — HEMOGLOBIN A1C
Hgb A1c MFr Bld: 5.2 % of total Hgb (ref <5.7)
Mean Plasma Glucose: 103 (calc)
eAG (mmol/L): 5.7 (calc)

## 2019-05-17 LAB — URINALYSIS, ROUTINE W REFLEX MICROSCOPIC
Bilirubin Urine: NEGATIVE
Glucose, UA: NEGATIVE
Hgb urine dipstick: NEGATIVE
Ketones, ur: NEGATIVE
Nitrite: NEGATIVE
Protein, ur: NEGATIVE
RBC / HPF: NONE SEEN /HPF (ref 0–2)
Specific Gravity, Urine: 1.023 (ref 1.001–1.03)
pH: <=5 (ref 5.0–8.0)

## 2019-05-17 LAB — LIPID PANEL
Cholesterol: 209 mg/dL — ABNORMAL HIGH (ref <200)
HDL: 38 mg/dL — ABNORMAL LOW (ref >=50)
LDL Cholesterol (Calc): 127 mg/dL (calc) — ABNORMAL HIGH
Non-HDL Cholesterol (Calc): 171 mg/dL (calc) — ABNORMAL HIGH (ref <130)
Total CHOL/HDL Ratio: 5.5 (calc) — ABNORMAL HIGH (ref <5.0)
Triglycerides: 287 mg/dL — ABNORMAL HIGH (ref <150)

## 2019-05-17 LAB — VITAMIN D 25 HYDROXY (VIT D DEFICIENCY, FRACTURES): Vit D, 25-Hydroxy: 77 ng/mL (ref 30–100)

## 2019-05-17 LAB — MAGNESIUM: Magnesium: 2.1 mg/dL (ref 1.5–2.5)

## 2019-05-17 LAB — TSH: TSH: 1.15 mIU/L (ref 0.40–4.50)

## 2019-05-17 LAB — MICROALBUMIN / CREATININE URINE RATIO
Creatinine, Urine: 136 mg/dL (ref 20–275)
Microalb Creat Ratio: 3 mcg/mg creat (ref <30)
Microalb, Ur: 0.4 mg/dL

## 2019-05-17 LAB — INSULIN, RANDOM: Insulin: 6 u[IU]/mL

## 2019-05-17 MED ORDER — EZETIMIBE 10 MG PO TABS: ORAL_TABLET | ORAL | 1 refills | Status: DC

## 2019-05-31 ENCOUNTER — Other Ambulatory Visit (INDEPENDENT_AMBULATORY_CARE_PROVIDER_SITE_OTHER): Payer: Medicare Other

## 2019-05-31 DIAGNOSIS — Z1211 Encounter for screening for malignant neoplasm of colon: Secondary | ICD-10-CM | POA: Diagnosis not present

## 2019-05-31 LAB — POC HEMOCCULT BLD/STL (HOME/3-CARD/SCREEN)
Card #2 Fecal Occult Blod, POC: NEGATIVE
Card #3 Fecal Occult Blood, POC: NEGATIVE
Fecal Occult Blood, POC: NEGATIVE

## 2019-06-10 ENCOUNTER — Ambulatory Visit (AMBULATORY_SURGERY_CENTER): Payer: Self-pay | Admitting: *Deleted

## 2019-06-10 ENCOUNTER — Other Ambulatory Visit: Payer: Self-pay

## 2019-06-10 VITALS — Ht 63.5 in | Wt 146.0 lb

## 2019-06-10 DIAGNOSIS — Z86010 Personal history of colonic polyps: Secondary | ICD-10-CM

## 2019-06-10 MED ORDER — NA SULFATE-K SULFATE-MG SULF 17.5-3.13-1.6 GM/177ML PO SOLN: ORAL | 0 refills | Status: DC

## 2019-06-10 NOTE — Progress Notes (Signed)
Patient is here for PV. Patient denies any allergies to eggs or soy. Patient denies any problems with anesthesia/sedation. Patient denies any oxygen use at home. Patient denies taking any diet/weight loss medications or blood thinners. Patient request low volume prep, she had a hard time drinking prep last time. Pt did not finish last prep. Suprep instructions given to pt.   Pt is aware that care partner will wait in the car during parking lot; if they feel like they will be too hot to wait in the car; they may wait in the lobby.  We want them to wear a mask (we do not have any that we can provide them), practice social distancing, and we will check their temperatures when they get here.  I did remind patient that their care partner needs to stay in the parking lot the entire time. Pt will wear mask into building

## 2019-06-23 ENCOUNTER — Telehealth: Payer: Self-pay | Admitting: Gastroenterology

## 2019-06-23 NOTE — Telephone Encounter (Signed)

## 2019-06-24 ENCOUNTER — Other Ambulatory Visit: Payer: Self-pay

## 2019-06-24 ENCOUNTER — Ambulatory Visit (AMBULATORY_SURGERY_CENTER): Payer: Medicare Other | Admitting: Gastroenterology

## 2019-06-24 ENCOUNTER — Encounter: Payer: Self-pay | Admitting: Gastroenterology

## 2019-06-24 ENCOUNTER — Encounter: Payer: Medicare Other | Admitting: Gastroenterology

## 2019-06-24 VITALS — BP 144/62 | HR 81 | Temp 98.2°F | Resp 16 | Ht 63.0 in | Wt 146.0 lb

## 2019-06-24 DIAGNOSIS — D123 Benign neoplasm of transverse colon: Secondary | ICD-10-CM | POA: Diagnosis not present

## 2019-06-24 DIAGNOSIS — K5730 Diverticulosis of large intestine without perforation or abscess without bleeding: Secondary | ICD-10-CM

## 2019-06-24 DIAGNOSIS — D120 Benign neoplasm of cecum: Secondary | ICD-10-CM | POA: Diagnosis not present

## 2019-06-24 DIAGNOSIS — Z86010 Personal history of colonic polyps: Secondary | ICD-10-CM | POA: Diagnosis not present

## 2019-06-24 DIAGNOSIS — D125 Benign neoplasm of sigmoid colon: Secondary | ICD-10-CM | POA: Diagnosis not present

## 2019-06-24 MED ORDER — SODIUM CHLORIDE 0.9 % IV SOLN: 500.0000 mL | Freq: Once | INTRAVENOUS | Status: DC

## 2019-06-24 NOTE — Progress Notes (Signed)
Pt's states no medical or surgical changes since previsit or office visit.  CW temp JB vitals

## 2019-06-24 NOTE — Op Note (Signed)
[facility] Patient Name: Alison Campbell Procedure Date: 06/24/2019 1:50 PM MRN: [id number] Endoscopist: Milus Banister , MD Age: 73 Referring MD:  Date of Birth: [birth year] Gender: Female Account #: [id number] Procedure:                Colonoscopy Indications:              High risk colon cancer surveillance: Personal                            history of colonic polyps; Colonoscopy 2012 Dr.                            Sharlett Iles removed adenomatous polyps Medicines:                Monitored Anesthesia Care Procedure:                Pre-Anesthesia Assessment:                           - Prior to the procedure, a History and Physical                            was performed, and patient medications and                            allergies were reviewed. The patient's tolerance of                            previous anesthesia was also reviewed. The risks                            and benefits of the procedure and the sedation                            options and risks were discussed with the patient.                            All questions were answered, and informed consent                            was obtained. Prior Anticoagulants: The patient has                            taken no previous anticoagulant or antiplatelet                            agents. ASA Grade Assessment: II - A patient with                            mild systemic disease. After reviewing the risks                            and benefits, the patient was deemed in  satisfactory condition to undergo the procedure.                           After obtaining informed consent, the colonoscope                            was passed under direct vision. Throughout the                            procedure, the patient's blood pressure, pulse, and                            oxygen saturations were monitored continuously. The                            Colonoscope was introduced  through the anus and                            advanced to the the cecum, identified by                            appendiceal orifice and ileocecal valve. The                            colonoscopy was performed without difficulty. The                            patient tolerated the procedure well. The quality                            of the bowel preparation was good. The ileocecal                            valve, appendiceal orifice, and rectum were                            photographed. Scope In: 2:00:55 PM Scope Out: 2:24:06 PM Scope Withdrawal Time: 0 hours 17 minutes 17 seconds  Total Procedure Duration: 0 hours 23 minutes 11 seconds  Findings:                 Two sessile polyps were found in the transverse                            colon and cecum. The polyps were 2 to 4 mm in size.                            These polyps were removed with a cold snare.                            Resection and retrieval were complete.                           A 15 mm polyp was found in the sigmoid colon. The  polyp was sessile. The polyp was removed with a                            saline injection-lift technique using a hot snare,                            piecemeal fashion. Resection and retrieval were                            complete. Following polypectomy the site was                            tattooed with a submucosal injection of Spot                            (carbon black).                           Multiple small and large-mouthed diverticula were                            found in the left colon.                           The exam was otherwise without abnormality on                            direct and retroflexion views. Complications:            No immediate complications. Estimated blood loss:                            None. Estimated Blood Loss:     Estimated blood loss: none. Impression:               - Two 2 to 4 mm polyps in the  transverse colon and                            in the cecum, removed with a cold snare. Resected                            and retrieved. Jar 1.                           - One 15 mm polyp in the sigmoid colon, removed                            using injection-lift and a hot snare, piecemeal                            fashion. Resected and retrieved. Jar 2. The site                            was labled with submucosal injection of Spot.                           -  Diverticulosis in the left colon.                           - The examination was otherwise normal on direct                            and retroflexion views. Recommendation:           - Patient has a contact number available for                            emergencies. The signs and symptoms of potential                            delayed complications were discussed with the                            patient. Return to normal activities tomorrow.                            Written discharge instructions were provided to the                            patient.                           - Resume previous diet.                           - Continue present medications. No ASA or NSAIDs                            for 2 weeks.                           - Repeat colonoscopy is recommended. The                            colonoscopy date will be determined after pathology                            results from today's exam become available for                            review. Milus Banister, MD 06/24/2019 2:32:05 PM This report has been signed electronically.

## 2019-06-24 NOTE — Progress Notes (Signed)
Called to room to assist during endoscopic procedure.  Patient ID and intended procedure confirmed with present staff. Received instructions for my participation in the procedure from the performing physician.  

## 2019-06-24 NOTE — Progress Notes (Signed)
PT taken to PACU. Monitors in place. VSS. Report given to RN. 

## 2019-06-24 NOTE — Patient Instructions (Signed)
3 polyps removed and sent to lab today. Diverticulosis noted. Please refer to report for more details. Please NO NSAIDS FOR 2 WEEKS!!!  THAT'S NO ASPIRIN, IBUPROFEN, ALEVE.  TAKE TYLENOL ONLY IF YOU NEED SOMETHING OVER-THE-COUNTER. Please note the 24/7 phone number circled in red below. There is a list of symptoms to report immediately highlighted yellow.    YOU HAD AN ENDOSCOPIC PROCEDURE TODAY AT [facility]:   Refer to the procedure report that was given to you for any specific questions about what was found during the examination.  If the procedure report does not answer your questions, please call your gastroenterologist to clarify.  If you requested that your care partner not be given the details of your procedure findings, then the procedure report has been included in a sealed envelope for you to review at your convenience later.  YOU SHOULD EXPECT: Some feelings of bloating in the abdomen. Passage of more gas than usual.  Walking can help get rid of the air that was put into your GI tract during the procedure and reduce the bloating. If you had a lower endoscopy (such as a colonoscopy or flexible sigmoidoscopy) you may notice spotting of blood in your stool or on the toilet paper. If you underwent a bowel prep for your procedure, you may not have a normal bowel movement for a few days.  Please Note:  You might notice some irritation and congestion in your nose or some drainage.  This is from the oxygen used during your procedure.  There is no need for concern and it should clear up in a day or so.  SYMPTOMS TO REPORT IMMEDIATELY:   Following lower endoscopy (colonoscopy or flexible sigmoidoscopy):  Excessive amounts of blood in the stool  Significant tenderness or worsening of abdominal pains  Swelling of the abdomen that is new, acute  Fever of 100F or higher   For urgent or emergent issues, a gastroenterologist can be reached at any hour by calling [phone]  [phone].   DIET:  We do recommend a small meal at first, but then you may proceed to your regular diet.  Drink plenty of fluids but you should avoid alcoholic beverages for 24 hours.  ACTIVITY:  You should plan to take it easy for the rest of today and you should NOT DRIVE or use heavy machinery until tomorrow (because of the sedation medicines used during the test).    FOLLOW UP: Our staff will call the number listed on your records 48-72 hours following your procedure to check on you and address any questions or concerns that you may have regarding the information given to you following your procedure. If we do not reach you, we will leave a message.  We will attempt to reach you two times.  During this call, we will ask if you have developed any symptoms of COVID 19. If you develop any symptoms (ie: fever, flu-like symptoms, shortness of breath, cough etc.) before then, please call [phone].  If you test positive for Covid 19 in the 2 weeks post procedure, please call and report this information to [place].    If any biopsies were taken you will be contacted by phone or by letter within the next 1-3 weeks.  Please call us at [phone] if you have not heard about the biopsies in 3 weeks.    SIGNATURES/CONFIDENTIALITY: You and/or your care partner have signed paperwork which will be entered into your electronic medical record.  These signatures attest  to the fact that that the information above on your After Visit Summary has been reviewed and is understood.  Full responsibility of the confidentiality of this discharge information lies with you and/or your care-partner. 

## 2019-06-27 ENCOUNTER — Ambulatory Visit
Admission: RE | Admit: 2019-06-27 | Discharge: 2019-06-27 | Disposition: A | Discharge status: Home / Self Care | Payer: Medicare Other | Source: Ambulatory Visit | Attending: Internal Medicine | Admitting: Internal Medicine

## 2019-06-27 DIAGNOSIS — Z87891 Personal history of nicotine dependence: Secondary | ICD-10-CM | POA: Diagnosis not present

## 2019-06-27 DIAGNOSIS — Z122 Encounter for screening for malignant neoplasm of respiratory organs: Secondary | ICD-10-CM

## 2019-06-28 ENCOUNTER — Telehealth: Payer: Self-pay | Admitting: *Deleted

## 2019-06-28 ENCOUNTER — Telehealth: Payer: Self-pay

## 2019-06-28 NOTE — Telephone Encounter (Signed)
  Follow up Call-  Call back number 06/24/2019  Post procedure Call Back phone  # [phone]  Permission to leave phone message Yes  Some recent data might be hidden    LMOM to call back if she has any questions and any of the following is a yes  1. Have you developed a fever since your procedure?   2.   Have you had an respiratory symptoms (SOB or cough) since your procedure?  3.   Have you tested positive for COVID 19 since your procedure   4.   Have you had any family members/close contacts diagnosed with the COVID 19 since your procedure?     If yes to any of these questions please route to Joylene John, RN and Alphonsa Gin, RN.

## 2019-06-28 NOTE — Telephone Encounter (Signed)
Follow up call made, left a voicemail. 

## 2019-07-04 ENCOUNTER — Encounter: Payer: Self-pay | Admitting: Gastroenterology

## 2019-07-06 ENCOUNTER — Ambulatory Visit
Admission: RE | Admit: 2019-07-06 | Discharge: 2019-07-06 | Disposition: A | Discharge status: Home / Self Care | Payer: Medicare Other | Source: Ambulatory Visit | Attending: Internal Medicine | Admitting: Internal Medicine

## 2019-07-06 ENCOUNTER — Other Ambulatory Visit: Payer: Self-pay

## 2019-07-06 DIAGNOSIS — Z1231 Encounter for screening mammogram for malignant neoplasm of breast: Secondary | ICD-10-CM | POA: Diagnosis not present

## 2019-08-17 NOTE — Progress Notes (Signed)
FOLLOW UP  Assessment and Plan:   Hypertension Well controlled off of medications at this time Monitor blood pressure at home; patient to call if consistently greater than 130/80 Continue DASH diet.   Reminder to go to the ER if any CP, SOB, nausea, dizziness, severe HA, changes vision/speech, left arm numbness and tingling and jaw pain.  Cholesterol Currently above goal; she is newly on zetia; has tolerated statins in the past but very resistant to these agents; would consider low dose/low frequency Continue low cholesterol diet and exercise.  Check lipid panel.   Other abnormal glucose Recent A1Cs at goal Discussed diet/exercise, weight management  Defer A1C; check CMP  BMI 25 Continue to recommend diet heavy in fruits and veggies and low in animal meats, cheeses, and dairy products, appropriate calorie intake Discuss exercise recommendations routinely Continue to monitor weight at each visit  Vitamin D Def At goal at last visit; continue supplementation to maintain goal of 70-100 Defer Vit D level  Tobacco use Discussed risks associated with tobacco use and advised to reduce or quit Patient is not ready to do so, but advised to consider strongly Will follow up at the next visit She is undergoing annual low dose CT  Dyshidrosis Chronic, 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 Further disposition pending results of labs. Discussed med's effects and SE's.   Over 30 minutes of exam, counseling, chart review, and critical decision making was performed.   Future Appointments  Date Time Provider [place]  12/01/2019 11:15 AM Vicie Mutters, PA-C [facility] None  03/06/2020 10:30 AM [name], MD [facility]  None  06/18/2020 10:00 AM [name], MD [facility] None    ----------------------------------------------------------------------------------------------------------------------  HPI 73 y.o. female  presents for 3 month follow up on hypertension, cholesterol, glucose management, weight and vitamin D deficiency.   Patient has COPD consequent of a 50+ yr smoking hx. She has had negative screening Chest CT's . Unfortunately, she continues to smoke. We discussed smoking cessation techniques/options but she is not ready to quit at this time.   She is prescribed xanax PRN for sleep, only takes 1/2 tab 2-3 days a week if struggling to sleep. Has not tried benadryl but willing to do so.   BMI is Body mass index is 25.97 kg/m., she has been working on diet, admits exercise is limited, but active around house and yard.  Wt Readings from Last 3 Encounters:  08/18/19 146 lb 9.6 oz (66.5 kg)  06/24/19 146 lb (66.2 kg)  06/10/19 146 lb (66.2 kg)   She had a negative stress Myoview (EF 65%) in Mar 2019.  Her blood pressure has been controlled at home, today their BP is BP: 140/82  She does not workout. She denies chest pain, shortness of breath, dizziness.   She is on cholesterol medication Zetia and denies myalgias (was on pravastatin, atorvastatin but stopped due to concerns with statin reputation, can't recall andy SE).  Her cholesterol is not at goal. The cholesterol last visit was:   Lab Results  Component Value Date   CHOL 209 (H) 05/16/2019   HDL 38 (L) 05/16/2019   LDLCALC 127 (H) 05/16/2019   TRIG 287 (H) 05/16/2019   CHOLHDL 5.5 (H) 05/16/2019    She has been working  on diet and exercise for hx of prediabetes, and denies increased appetite, nausea, paresthesia of the feet, polydipsia, polyuria and visual disturbances. Last A1C in the office was:  Lab Results  Component Value Date   HGBA1C 5.2 05/16/2019   Patient is on Vitamin D supplement.   Lab Results  Component Value Date    VD25OH 77 05/16/2019        Current Medications:  Current Outpatient Medications on File Prior to Visit  Medication Sig  . ALPRAZolam (XANAX) 1 MG tablet 1/2 to 1 tablet 2 to 3 x daily as needed for anxiety or sleep  . aspirin 81 MG EC tablet Take 1 tablet daily  . CALCIUM PO Take 2 tablets by mouth daily. 600 mg  2 per day  . Cholecalciferol (VITAMIN D PO) Take 2,000 Units by mouth daily. Take 5 caps to = 10,000 units daily.  . Cyanocobalamin (VITAMIN B 12 PO) Take 1 tablet by mouth daily.  Marland Kitchen ezetimibe (ZETIA) 10 MG tablet Take 1 tablet daily for cholesterol.  . Multiple Vitamin (MULTIVITAMIN PO) Take 1 tablet by mouth daily.   . Psyllium (METAMUCIL FIBER PO) Take by mouth daily.   No current facility-administered medications on file prior to visit.      Allergies:  Allergies  Allergen Reactions  . Fish Oil Diarrhea     Medical History:  Past Medical History:  Diagnosis Date  . Anxiety   . Colon polyps   . GERD (gastroesophageal reflux disease)    coffee related  . Hyperlipidemia   . Insomnia   . Kidney stones   . Osteoporosis   . Pre-diabetes    Family history- Reviewed and unchanged Social history- Reviewed and unchanged   Review of Systems:  Review of Systems  Constitutional: Negative for malaise/fatigue and weight loss.  HENT: Negative for hearing loss and tinnitus.   Eyes: Negative for blurred vision and double vision.  Respiratory: Negative for cough, shortness of breath and wheezing.   Cardiovascular: Negative for chest pain, palpitations, orthopnea, claudication and leg swelling.  Gastrointestinal: Negative for abdominal pain, blood in stool, constipation, diarrhea, heartburn, melena, nausea and vomiting.  Genitourinary: Negative.   Musculoskeletal: Negative for joint pain and myalgias.  Skin: Positive for rash (chronic to bil soles).  Neurological: Negative for dizziness, tingling, sensory change, weakness and headaches.  Endo/Heme/Allergies:  Negative for polydipsia.  Psychiatric/Behavioral: Negative.   All other systems reviewed and are negative.    Physical Exam: BP 140/82   Pulse 79   Temp (!) 97 F (36.1 C)   Wt 146 lb 9.6 oz (66.5 kg)   SpO2 97%   BMI 25.97 kg/m  Wt Readings from Last 3 Encounters:  08/18/19 146 lb 9.6 oz (66.5 kg)  06/24/19 146 lb (66.2 kg)  06/10/19 146 lb (66.2 kg)   General Appearance: Well nourished, in no apparent distress. Eyes: PERRLA, EOMs, conjunctiva no swelling or erythema Sinuses: No Frontal/maxillary tenderness ENT/Mouth: Ext aud canals clear, TMs without erythema, bulging. No erythema, swelling, or exudate on post pharynx.  Tonsils not swollen or erythematous. Hearing normal.  Neck: Supple, thyroid normal.  Respiratory: Respiratory effort normal, BS equal bilaterally without rales, rhonchi, wheezing or stridor.  Cardio: RRR with no MRGs. Brisk peripheral pulses without edema.  Abdomen: Soft, + BS.  Non tender, no guarding, rebound, hernias, masses. Lymphatics: Non tender without lymphadenopathy.  Musculoskeletal: Full ROM, 5/5 strength, Normal gait Skin: Warm, dry without lesions, ecchymosis. She has rash to sole of R foot, similar  but less to L sole; erythematous with pustules Neuro: Cranial nerves intact. No cerebellar symptoms.  Psych: Awake and oriented X 3, normal affect, Insight and Judgment appropriate.    Izora Ribas, NP 11:01 AM [facility]

## 2019-08-18 ENCOUNTER — Encounter: Payer: Self-pay | Admitting: Adult Health

## 2019-08-18 ENCOUNTER — Other Ambulatory Visit: Payer: Self-pay

## 2019-08-18 ENCOUNTER — Ambulatory Visit (INDEPENDENT_AMBULATORY_CARE_PROVIDER_SITE_OTHER): Payer: Medicare Other | Admitting: Adult Health

## 2019-08-18 VITALS — BP 140/82 | HR 79 | Temp 97.0°F | Wt 146.6 lb

## 2019-08-18 DIAGNOSIS — G4700 Insomnia, unspecified: Secondary | ICD-10-CM | POA: Diagnosis not present

## 2019-08-18 DIAGNOSIS — E559 Vitamin D deficiency, unspecified: Secondary | ICD-10-CM

## 2019-08-18 DIAGNOSIS — Z79899 Other long term (current) drug therapy: Secondary | ICD-10-CM

## 2019-08-18 DIAGNOSIS — E782 Mixed hyperlipidemia: Secondary | ICD-10-CM

## 2019-08-18 DIAGNOSIS — Z6825 Body mass index (BMI) 25.0-25.9, adult: Secondary | ICD-10-CM

## 2019-08-18 DIAGNOSIS — F17200 Nicotine dependence, unspecified, uncomplicated: Secondary | ICD-10-CM | POA: Diagnosis not present

## 2019-08-18 DIAGNOSIS — L301 Dyshidrosis [pompholyx]: Secondary | ICD-10-CM | POA: Diagnosis not present

## 2019-08-18 DIAGNOSIS — I10 Essential (primary) hypertension: Secondary | ICD-10-CM | POA: Diagnosis not present

## 2019-08-18 DIAGNOSIS — R7309 Other abnormal glucose: Secondary | ICD-10-CM

## 2019-08-18 MED ORDER — DEXAMETHASONE SODIUM PHOSPHATE 10 MG/ML IJ SOLN
10.0000 mg | Freq: Once | INTRAMUSCULAR | Status: AC
  Administered 2019-08-18: 12:00:00 10 mg via INTRAMUSCULAR

## 2019-08-18 NOTE — Patient Instructions (Addendum)
Goals    . Blood Pressure < 130/80    . Exercise 15-20 min of brisk walking daily    . LDL CALC < 100    . Quit Smoking        SMOKING CESSATION  American cancer society  49826415830 for more information or for a free program for smoking cessation help.   You can call QUIT SMART 1-800-QUIT-NOW for free nicotine patches or replacement therapy- if they are out- keep calling  [facility] Can call for smoking cessation classes, [phone]  If you have a smart phone, please look up Smoke Free app, this will help you stay on track and give you information about money you have saved, life that you have gained back and a ton of more information.     ADVANTAGES OF QUITTING SMOKING  Within 20 minutes, blood pressure decreases. Your pulse is at normal level.  After 8 hours, carbon monoxide levels in the blood return to normal. Your oxygen level increases.  After 24 hours, the chance of having a heart attack starts to decrease. Your breath, hair, and body stop smelling like smoke.  After 48 hours, damaged nerve endings begin to recover. Your sense of taste and smell improve.  After 72 hours, the body is virtually free of nicotine. Your bronchial tubes relax and breathing becomes easier.  After 2 to 12 weeks, lungs can hold more air. Exercise becomes easier and circulation improves.  After 1 year, the risk of coronary heart disease is cut in half.  After 5 years, the risk of stroke falls to the same as a nonsmoker.  After 10 years, the risk of lung cancer is cut in half and the risk of other cancers decreases significantly.  After 15 years, the risk of coronary heart disease drops, usually to the level of a nonsmoker.  You will have extra money to spend on things other than cigarettes.      Preventing High Cholesterol Cholesterol is a white, waxy substance similar to fat that the human body needs to help build cells. The liver makes all the cholesterol that a  person's body needs. Having high cholesterol (hypercholesterolemia) increases a person's risk for heart disease and stroke. Extra (excess) cholesterol comes from the food the person eats. High cholesterol can often be prevented with diet and lifestyle changes. If you already have high cholesterol, you can control it with diet and lifestyle changes and with medicine. How can high cholesterol affect me? If you have high cholesterol, deposits (plaques) may build up on the walls of your arteries. The arteries are the blood vessels that carry blood away from your heart. Plaques make the arteries narrower and stiffer. This can limit or block blood flow and cause blood clots to form. Blood clots:  Are tiny balls of cells that form in your blood.  Can move to the heart or brain, causing a heart attack or stroke. Plaques in arteries greatly increase your risk for heart attack and stroke.Making diet and lifestyle changes can reduce your risk for these conditions that may threaten your life. What can increase my risk? This condition is more likely to develop in people who:  Eat foods that are high in saturated fat or cholesterol. Saturated fat is mostly found in: ? Foods that contain animal fat, such as red meat and some dairy products. ? Certain fatty foods made from plants, such as tropical oils.  Are overweight.  Are not getting enough exercise.  Have a family  history of high cholesterol. What actions can I take to prevent this? Nutrition   Eat less saturated fat.  Avoid trans fats (partially hydrogenated oils). These are often found in margarine and in some baked goods, fried foods, and snacks bought in packages.  Avoid precooked or cured meat, such as sausages or meat loaves.  Avoid foods and drinks that have added sugars.  Eat more fruits, vegetables, and whole grains.  Choose healthy sources of protein, such as fish, poultry, lean cuts of red meat, beans, peas, lentils, and  nuts.  Choose healthy sources of fat, such as: ? Nuts. ? Vegetable oils, especially olive oil. ? Fish that have healthy fats (omega-3 fatty acids), such as mackerel or salmon. The items listed above may not be a complete list of recommended foods and beverages. Contact a dietitian for more information. Lifestyle  Lose weight if you are overweight. Losing 5-10 lb (2.3-4.5 kg) can help prevent or control high cholesterol. It can also lower your risk for diabetes and high blood pressure. Ask your health care provider to help you with a diet and exercise plan to lose weight safely.  Do not use any products that contain nicotine or tobacco, such as cigarettes, e-cigarettes, and chewing tobacco. If you need help quitting, ask your health care provider.  Limit your alcohol intake. ? Do not drink alcohol if:  Your health care provider tells you not to drink.  You are pregnant, may be pregnant, or are planning to become pregnant. ? If you drink alcohol:  Limit how much you use to:  0-1 drink a day for women.  0-2 drinks a day for men.  Be aware of how much alcohol is in your drink. In the [place] one drink equals one 12 oz bottle of beer (355 mL), one 5 oz glass of wine (148 mL), or one 1 oz glass of hard liquor (44 mL). Activity   Get enough exercise. Each week, do at least 150 minutes of exercise that takes a medium level of effort (moderate-intensity exercise). ? This is exercise that:  Makes your heart beat faster and makes you breathe harder than usual.  Allows you to still be able to talk. ? You could exercise in short sessions several times a day or longer sessions a few times a week. For example, on 5 days each week, you could walk fast or ride your bike 3 times a day for 10 minutes each time.  Do exercises as told by your health care provider. Medicines  In addition to diet and lifestyle changes, your health care provider may recommend medicines to help lower cholesterol.  This may be a medicine to lower the amount of cholesterol your liver makes. You may need medicine if: ? Diet and lifestyle changes do not lower your cholesterol enough. ? You have high cholesterol and other risk factors for heart disease or stroke.  Take over-the-counter and prescription medicines only as told by your health care provider. General information  Manage your risk factors for high cholesterol. Talk with your health care provider about all your risk factors and how to lower your risk.  Manage other conditions that you have, such as diabetes or high blood pressure (hypertension).  Have blood tests to check your cholesterol levels at regular points in time as told by your health care provider.  Keep all follow-up visits as told by your health care provider. This is important. Where to find more information  American Heart Association: www.heart.org  National Heart,  Lung, and Blood Institute: https://wilson-eaton.com/ Summary  High cholesterol increases your risk for heart disease and stroke. By keeping your cholesterol level low, you can reduce your risk for these conditions.  High cholesterol can often be prevented with diet and lifestyle changes.  Work with your health care provider to manage your risk factors, and have your blood tested regularly. This information is not intended to replace advice given to you by your health care provider. Make sure you discuss any questions you have with your health care provider. Document Released: 12/30/2015 Document Revised: 04/08/2019 Document Reviewed: 08/23/2016 Elsevier Patient Education  2020 [place] Eczema Dyshidrotic eczema (pompholyx) is a type of eczema that causes very itchy (pruritic), fluid-filled blisters (vesicles) to form on the hands and feet. It can affect people of any age, but is more common before the age of 32. There is no cure, but treatment and certain lifestyle changes can help relieve  symptoms. What are the causes? The cause of this condition is not known. What increases the risk? You are more likely to develop this condition if:  You wash your hands frequently.  You have a personal history or family history of eczema, allergies, asthma, or hay fever.  You are allergic to metals such as nickel or cobalt.  You work with cement.  You smoke. What are the signs or symptoms? Symptoms of this condition may affect the hands, feet, or both. Symptoms may come and go (recur), and may include:  Severe itching, which may happen before blisters appear.  Blisters. These may form suddenly. ? In the early stages, blisters may form near the fingertips. ? In severe cases, blisters may grow to large blister masses (bullae). ? Blisters resolve in 2-3 weeks without bursting. This is followed by a dry phase in which itching eases.  Pain and swelling.  Cracks or long, narrow openings (fissures) in the skin.  Severe dryness.  Ridges on the nails. How is this diagnosed? This condition may be diagnosed based on:  A physical exam.  Your symptoms.  Your medical history.  Skin scrapings to rule out a fungal infection.  Testing a swab of fluid for bacteria (culture).  Removing and checking a small piece of skin (biopsy) in order to test for infection or to rule out other conditions.  Skin patch tests. These tests involve taking patches that contain possible allergens and placing them on your back. Your health care provider will wait a few days and then check to see if an allergic reaction occurred. These tests may be done if your health care provider suspects allergic reactions, or to rule out other types of eczema. You may be referred to a health care provider who specializes in the skin (dermatologist) to help diagnose and treat this condition. How is this treated? There is no cure for this condition, but treatment can help relieve symptoms. Depending on how many blisters  you have and how severe they are, your health care provider may suggest:  Avoiding allergens, irritants, or triggers that worsen symptoms. This may involve lifestyle changes such as: ? Using different lotions or soaps. ? Avoiding hot weather or places that will cause you to sweat a lot. ? Managing stress with coping techniques such as relaxation and exercise, and asking for help when you need it. ? Diet changes as recommended by your health care provider.  Using a clean, damp towel (cool compress) to relieve symptoms.  Soaking in a bath that contains  a type of salt that relieves irritation (aluminum acetate soaks).  Medicine taken by mouth to reduce itching (oral antihistamines).  Medicine applied to the skin to reduce swelling and irritation (topical corticosteroids).  Medicine that reduces the activity of the body's disease-fighting system (immunosuppressants) to treat inflammation. This may be given in severe cases.  Antibiotic medicines to treat bacterial infection.  Light therapy (phototherapy). This involves shining ultraviolet (UV) light on affected skin in order to reduce itchiness and inflammation. Follow these instructions at home: Bathing and skin care   Wash skin gently. After bathing or washing your hands, pat your skin dry. Avoid rubbing your skin.  Remove all jewelry before bathing. If the skin under the jewelry stays wet, blisters may form or get worse.  Apply cool compresses as told by your health care provider: ? Soak a clean towel in cool water. ? Wring out excess water until towel is damp. ? Place the towel over affected skin. Leave the towel on for 20 minutes at a time, 2-3 times a day.  Use mild soaps, cleansers, and lotions that do not contain dyes, perfumes, or other irritants.  Keep your skin hydrated. To do this: ? Avoid very hot water. Take lukewarm baths or showers. ? Apply moisturizer within three minutes of bathing. This locks in  moisture. Medicines  Take and apply over-the-counter and prescription medicines only as told by your health care provider.  If you were prescribed antibiotic medicine, take or apply it as told by your health care provider. Do not stop using the antibiotic even if you start to feel better. General instructions  Identify and avoid triggers and allergens.  Keep fingernails short to avoid breaking open the skin while scratching.  Use waterproof gloves to protect your hands when doing work that keeps your hands wet for a long time.  Wear socks to keep your feet dry.  Do not use any products that contain nicotine or tobacco, such as cigarettes and e-cigarettes. If you need help quitting, ask your health care provider.  Keep all follow-up visits as told by your health care provider. This is important. Contact a health care provider if:  You have symptoms that do not go away.  You have signs of infection, such as: ? Crusting, pus, or a bad smell. ? More redness, swelling, or pain. ? Increased warmth in the affected area. Summary  Dyshidrotic eczema (pompholyx) is a type of eczema that causes very itchy (pruritic), fluid-filled blisters (vesicles) to form on the hands and feet.  The cause of this condition is not known.  There is no cure for this condition, but treatment can help relieve symptoms. Treatment depends on how many blisters you have and how severe they are.  Use mild soaps, cleansers, and lotions that do not contain dyes, perfumes, or other irritants. Keep your skin hydrated. This information is not intended to replace advice given to you by your health care provider. Make sure you discuss any questions you have with your health care provider. Document Released: 04/30/2017 Document Revised: 04/06/2019 Document Reviewed: 04/30/2017 Elsevier Patient Education  [place].

## 2019-08-19 LAB — CBC WITH DIFFERENTIAL/PLATELET
Absolute Monocytes: 506 cells/uL (ref 200–950)
Basophils Absolute: 46 cells/uL (ref 0–200)
Basophils Relative: 0.4 %
Eosinophils Absolute: 276 cells/uL (ref 15–500)
Eosinophils Relative: 2.4 %
HCT: 42 % (ref 35.0–45.0)
Hemoglobin: 14 g/dL (ref 11.7–15.5)
Lymphs Abs: 2289 cells/uL (ref 850–3900)
MCH: 30.8 pg (ref 27.0–33.0)
MCHC: 33.3 g/dL (ref 32.0–36.0)
MCV: 92.5 fL (ref 80.0–100.0)
MPV: 11.8 fL (ref 7.5–12.5)
Monocytes Relative: 4.4 %
Neutro Abs: 8384 cells/uL — ABNORMAL HIGH (ref 1500–7800)
Neutrophils Relative %: 72.9 %
Platelets: 287 10*3/uL (ref 140–400)
RBC: 4.54 10*6/uL (ref 3.80–5.10)
RDW: 12.4 % (ref 11.0–15.0)
Total Lymphocyte: 19.9 %
WBC: 11.5 10*3/uL — ABNORMAL HIGH (ref 3.8–10.8)

## 2019-08-19 LAB — COMPLETE METABOLIC PANEL WITH GFR
AG Ratio: 1.7 (calc) (ref 1.0–2.5)
ALT: 18 U/L (ref 6–29)
AST: 15 U/L (ref 10–35)
Albumin: 4.3 g/dL (ref 3.6–5.1)
Alkaline phosphatase (APISO): 63 U/L (ref 37–153)
BUN/Creatinine Ratio: 23 (calc) — ABNORMAL HIGH (ref 6–22)
BUN: 23 mg/dL (ref 7–25)
CO2: 26 mmol/L (ref 20–32)
Calcium: 10.2 mg/dL (ref 8.6–10.4)
Chloride: 106 mmol/L (ref 98–110)
Creat: 1.02 mg/dL — ABNORMAL HIGH (ref 0.60–0.93)
GFR, Est African American: 62 mL/min/{1.73_m2} (ref >=60)
GFR, Est Non African American: 54 mL/min/{1.73_m2} — ABNORMAL LOW (ref >=60)
Globulin: 2.5 g/dL (calc) (ref 1.9–3.7)
Glucose, Bld: 80 mg/dL (ref 65–99)
Potassium: 4.1 mmol/L (ref 3.5–5.3)
Sodium: 141 mmol/L (ref 135–146)
Total Bilirubin: 0.3 mg/dL (ref 0.2–1.2)
Total Protein: 6.8 g/dL (ref 6.1–8.1)

## 2019-08-19 LAB — LIPID PANEL
Cholesterol: 228 mg/dL — ABNORMAL HIGH (ref <200)
HDL: 40 mg/dL — ABNORMAL LOW (ref >=50)
LDL Cholesterol (Calc): 133 mg/dL (calc) — ABNORMAL HIGH
Non-HDL Cholesterol (Calc): 188 mg/dL (calc) — ABNORMAL HIGH (ref <130)
Total CHOL/HDL Ratio: 5.7 (calc) — ABNORMAL HIGH (ref <5.0)
Triglycerides: 386 mg/dL — ABNORMAL HIGH (ref <150)

## 2019-08-19 LAB — TSH: TSH: 0.99 mIU/L (ref 0.40–4.50)

## 2019-08-22 ENCOUNTER — Other Ambulatory Visit: Payer: Self-pay

## 2019-08-22 MED ORDER — ROSUVASTATIN CALCIUM 5 MG PO TABS: ORAL_TABLET | ORAL | 2 refills | Status: DC

## 2019-10-03 ENCOUNTER — Other Ambulatory Visit: Payer: Self-pay | Admitting: Internal Medicine

## 2019-10-03 DIAGNOSIS — M810 Age-related osteoporosis without current pathological fracture: Secondary | ICD-10-CM

## 2019-11-08 ENCOUNTER — Other Ambulatory Visit: Payer: Self-pay | Admitting: Internal Medicine

## 2019-11-08 DIAGNOSIS — E782 Mixed hyperlipidemia: Secondary | ICD-10-CM

## 2019-11-08 MED ORDER — EZETIMIBE 10 MG PO TABS: ORAL_TABLET | ORAL | 3 refills | Status: DC

## 2019-11-28 ENCOUNTER — Encounter: Payer: Self-pay | Admitting: Gastroenterology

## 2019-12-01 ENCOUNTER — Ambulatory Visit: Payer: Self-pay | Admitting: Physician Assistant

## 2019-12-11 NOTE — Progress Notes (Signed)
   History of Present Illness:  This very nice 73 y.o. MWF with HTN, HLD, Prediabetes  and Vitamin D Deficiency presents with c/o  Left arm pains w/o hx/o injury.  Patient has hx/o labile HTN and she had a negative stress Myoview (EF 65%) in  2019.   Medications  .  alendronate (FOSAMAX) 70 MG tablet, TAKE 1 TABLET BY MOUTH EVERY WEEK ON AN EMPTY STOMACH WITH A FULL GLASS OF WATER FOR ONE HOUR .  ezetimibe (ZETIA) 10 MG tablet, Take 1 tablet Daily for Cholesterol. .  rosuvastatin (CRESTOR) 5 MG tablet, Take one tablet at bedtime once a week for Cholesterol. Marland Kitchen  aspirin 81 MG EC tablet, Take 1 tablet daily .  Cyanocobalamin (VITAMIN B 12 PO), Take 1 tablet by mouth daily.  Marland Kitchen  ALPRAZolam (XANAX) 1 MG tablet, 1/2 to 1 tablet 2 to 3 x daily as needed for anxiety or sleep .  CALCIUM PO, Take 2 tablets by mouth daily. 600 mg  2 per day .  Cholecalciferol (VITAMIN D PO), Take 2,000 Units by mouth daily. Take 5 caps to = 10,000 units daily. .  Multiple Vitamin (MULTIVITAMIN PO), Take 1 tablet by mouth daily.  .  Psyllium (METAMUCIL FIBER PO), Take by mouth daily.  Problem list She has Diverticulosis of large intestine; Colon polyp; Essential hypertension; Hyperlipidemia, mixed; Abnormal glucose; Chronic bronchitis ([place]); Vitamin D deficiency; Medication management; Tobacco use disorder; Osteoporosis; Aortic atherosclerosis ([place]); Insomnia; and Dyshidrotic foot dermatitis on their problem list.   Observations/Objective:  BP (!) 142/86   Pulse 72   Temp (!) 96.9 F (36.1 C)   Resp 16   Ht 5' 3.5" (1.613 m)   Wt 146 lb 3.2 oz (66.3 kg)   BMI 25.49 kg/m   HEENT - WNL. Neck - supple.  Chest - Clear equal BS. Cor - Nl HS. RRR w/o sig M MS- FROM w/o deformities.  Gait Nl.  Slight decrease external /internal rotation of the Lt shoulder  with no tenderness about the shoulder. There is tenderness  about the distal or lower arm neat the elbow.  Neuro -  Nl w/o focal abnormalities. Assessment  and Plan:  1. Arm pain, inferior, left  - dexamethasone (DECADRON) 4 MG tablet; Take 1 tab 3 x day - 3 days, then 2 x day - 3 days, then 1 tab daily  Dispense: 20 tablet; Refill: 0  Follow Up Instructions:  - Advised patient if not significantly improved over the next 5-7 days, to call  For Ortho referral.   I discussed the assessment and treatment plan with the patient. The patient was provided an opportunity to ask questions and all were answered. The patient agreed with the plan and demonstrated an understanding of the instructions.   The patient was advised to call back or seek an in-person evaluation if the symptoms worsen or if the condition fails to improve as anticipated.   Kirtland Bouchard, MD

## 2019-12-12 ENCOUNTER — Ambulatory Visit (INDEPENDENT_AMBULATORY_CARE_PROVIDER_SITE_OTHER): Payer: Medicare Other | Admitting: Internal Medicine

## 2019-12-12 ENCOUNTER — Encounter: Payer: Self-pay | Admitting: Internal Medicine

## 2019-12-12 ENCOUNTER — Other Ambulatory Visit: Payer: Self-pay

## 2019-12-12 VITALS — BP 142/86 | HR 72 | Temp 96.9°F | Resp 16 | Ht 63.5 in | Wt 146.2 lb

## 2019-12-12 DIAGNOSIS — M79602 Pain in left arm: Secondary | ICD-10-CM | POA: Diagnosis not present

## 2019-12-12 MED ORDER — DEXAMETHASONE 4 MG PO TABS: ORAL_TABLET | ORAL | 0 refills | Status: DC

## 2019-12-15 ENCOUNTER — Encounter: Payer: Self-pay | Admitting: Gastroenterology

## 2019-12-21 ENCOUNTER — Ambulatory Visit (AMBULATORY_SURGERY_CENTER): Payer: Medicare Other | Admitting: *Deleted

## 2019-12-21 ENCOUNTER — Other Ambulatory Visit: Payer: Self-pay

## 2019-12-21 VITALS — Temp 96.9°F | Ht 63.0 in | Wt 144.0 lb

## 2019-12-21 DIAGNOSIS — Z86010 Personal history of colonic polyps: Secondary | ICD-10-CM

## 2019-12-21 DIAGNOSIS — Z1159 Encounter for screening for other viral diseases: Secondary | ICD-10-CM

## 2019-12-21 NOTE — Progress Notes (Signed)

## 2019-12-21 NOTE — Progress Notes (Signed)
Medication Samples have been provided to the patient.  Drug name: SUPREP     Strength:   Qty: 1   [id number]  Exp.Date: 09/22

## 2019-12-26 NOTE — Addendum Note (Signed)
Addended by: Steva Ready on: 12/26/2019 03:36 PM   Modules accepted: Orders

## 2020-01-04 ENCOUNTER — Ambulatory Visit (INDEPENDENT_AMBULATORY_CARE_PROVIDER_SITE_OTHER): Payer: Medicare Other

## 2020-01-04 ENCOUNTER — Other Ambulatory Visit: Payer: Self-pay | Admitting: Gastroenterology

## 2020-01-04 DIAGNOSIS — Z1159 Encounter for screening for other viral diseases: Secondary | ICD-10-CM | POA: Diagnosis not present

## 2020-01-04 LAB — SARS CORONAVIRUS 2 (TAT 6-24 HRS): SARS Coronavirus 2: NEGATIVE

## 2020-01-09 ENCOUNTER — Encounter: Payer: Self-pay | Admitting: Gastroenterology

## 2020-01-09 ENCOUNTER — Other Ambulatory Visit: Payer: Self-pay

## 2020-01-09 ENCOUNTER — Ambulatory Visit (AMBULATORY_SURGERY_CENTER): Payer: Medicare Other | Admitting: Gastroenterology

## 2020-01-09 VITALS — BP 144/69 | HR 76 | Temp 98.2°F | Resp 22 | Ht 63.0 in | Wt 144.0 lb

## 2020-01-09 DIAGNOSIS — E7800 Pure hypercholesterolemia, unspecified: Secondary | ICD-10-CM | POA: Diagnosis not present

## 2020-01-09 DIAGNOSIS — K219 Gastro-esophageal reflux disease without esophagitis: Secondary | ICD-10-CM | POA: Diagnosis not present

## 2020-01-09 DIAGNOSIS — Z86010 Personal history of colonic polyps: Secondary | ICD-10-CM

## 2020-01-09 MED ORDER — SODIUM CHLORIDE 0.9 % IV SOLN: 500.0000 mL | Freq: Once | INTRAVENOUS | Status: DC

## 2020-01-09 NOTE — Progress Notes (Signed)
Report given to PACU, vss 

## 2020-01-09 NOTE — Progress Notes (Signed)
Pt's states no medical or surgical changes since previsit or office visit. 

## 2020-01-09 NOTE — Op Note (Signed)
[facility] Patient Name: Alison Campbell Procedure Date: 01/09/2020 11:02 AM MRN: [id number] Endoscopist: Milus Banister , MD Age: 74 Referring MD:  Date of Birth: 07/16/1946 Gender: Female Account #: [id number] Procedure:                Colonoscopy Indications:              High risk colon cancer surveillance: Personal                            history of colonic polyps; Colonoscopy 05/2019 three                            adenomatous polyps removed including a 64mm sigmoid                            polyp that required piecemeal resection, site was                            tattooed. Medicines:                Monitored Anesthesia Care Procedure:                Pre-Anesthesia Assessment:                           - Prior to the procedure, a History and Physical                            was performed, and patient medications and                            allergies were reviewed. The patient's tolerance of                            previous anesthesia was also reviewed. The risks                            and benefits of the procedure and the sedation                            options and risks were discussed with the patient.                            All questions were answered, and informed consent                            was obtained. Prior Anticoagulants: The patient has                            taken no previous anticoagulant or antiplatelet                            agents. ASA Grade Assessment: II - A patient with  mild systemic disease. After reviewing the risks                            and benefits, the patient was deemed in                            satisfactory condition to undergo the procedure.                           After obtaining informed consent, the colonoscope                            was passed under direct vision. Throughout the                            procedure, the patient's blood pressure, pulse, and                             oxygen saturations were monitored continuously. The                            Colonoscope was introduced through the anus and                            advanced to the the cecum, identified by                            appendiceal orifice and ileocecal valve. The                            colonoscopy was performed without difficulty. The                            patient tolerated the procedure well. The quality                            of the bowel preparation was good. The ileocecal                            valve, appendiceal orifice, and rectum were                            photographed. Scope In: 11:05:10 AM Scope Out: 11:15:25 AM Scope Withdrawal Time: 0 hours 6 minutes 33 seconds  Total Procedure Duration: 0 hours 10 minutes 15 seconds  Findings:                 Multiple small-mouthed diverticula were found in                            the left colon.                           The site of 05/2019 sigmoid polypectomy was easily  located with aid of the previous Spot tattoo and                            was clear of adenomatous appearing mucosa.                           The exam was otherwise without abnormality on                            direct and retroflexion views. Complications:            No immediate complications. Estimated blood loss:                            None. Estimated Blood Loss:     Estimated blood loss: none. Impression:               - Diverticulosis in the left colon.                           - The site of 05/2019 sigmoid polypectomy was easily                            located with aid of the previous Spot tattoo and                            was clear of adenomatous appearing mucosa.                           - The examination was otherwise normal on direct                            and retroflexion views.                           - No polyps or cancers. Recommendation:           -  Patient has a contact number available for                            emergencies. The signs and symptoms of potential                            delayed complications were discussed with the                            patient. Return to normal activities tomorrow.                            Written discharge instructions were provided to the                            patient.                           - Resume previous diet.                           -  Continue present medications.                           - Repeat colonoscopy in 3 years for surveillance. Milus Banister, MD 01/09/2020 11:19:43 AM This report has been signed electronically.

## 2020-01-09 NOTE — Patient Instructions (Signed)
Please, read all of the handouts given to you by your recovery room nurse.  Thank-you for choosing [place] for your healthcare needs today.  YOU HAD AN ENDOSCOPIC PROCEDURE TODAY AT [facility]:   Refer to the procedure report that was given to you for any specific questions about what was found during the examination.  If the procedure report does not answer your questions, please call your gastroenterologist to clarify.  If you requested that your care partner not be given the details of your procedure findings, then the procedure report has been included in a sealed envelope for you to review at your convenience later.  YOU SHOULD EXPECT: Some feelings of bloating in the abdomen. Passage of more gas than usual.  Walking can help get rid of the air that was put into your GI tract during the procedure and reduce the bloating. If you had a lower endoscopy (such as a colonoscopy or flexible sigmoidoscopy) you may notice spotting of blood in your stool or on the toilet paper. If you underwent a bowel prep for your procedure, you may not have a normal bowel movement for a few days.  Please Note:  You might notice some irritation and congestion in your nose or some drainage.  This is from the oxygen used during your procedure.  There is no need for concern and it should clear up in a day or so.  SYMPTOMS TO REPORT IMMEDIATELY:   Following lower endoscopy (colonoscopy or flexible sigmoidoscopy):  Excessive amounts of blood in the stool  Significant tenderness or worsening of abdominal pains  Swelling of the abdomen that is new, acute  Fever of 100F or higher   For urgent or emergent issues, a gastroenterologist can be reached at any hour by calling [phone].   DIET:  We do recommend a small meal at first, but then you may proceed to your regular diet.  Drink plenty of fluids but you should avoid alcoholic beverages for 24 hours. Try to increase the fiber in your diet, and drink  plenty of water.  ACTIVITY:  You should plan to take it easy for the rest of today and you should NOT DRIVE or use heavy machinery until tomorrow (because of the sedation medicines used during the test).    FOLLOW UP: Our staff will call the number listed on your records 48-72 hours following your procedure to check on you and address any questions or concerns that you may have regarding the information given to you following your procedure. If we do not reach you, we will leave a message.  We will attempt to reach you two times.  During this call, we will ask if you have developed any symptoms of COVID 19. If you develop any symptoms (ie: fever, flu-like symptoms, shortness of breath, cough etc.) before then, please call [phone].  If you test positive for Covid 19 in the 2 weeks post procedure, please call and report this information to [place].     SIGNATURES/CONFIDENTIALITY: You and/or your care partner have signed paperwork which will be entered into your electronic medical record.  These signatures attest to the fact that that the information above on your After Visit Summary has been reviewed and is understood.  Full responsibility of the confidentiality of this discharge information lies with you and/or your care-partner.

## 2020-01-11 ENCOUNTER — Telehealth: Payer: Self-pay | Admitting: *Deleted

## 2020-01-11 NOTE — Telephone Encounter (Signed)
  Follow up Call-  Call back number 01/09/2020 06/24/2019  Post procedure Call Back phone  # [phone]  Permission to leave phone message Yes Yes  Some recent data might be hidden     Patient questions:  Do you have a fever, pain , or abdominal swelling? No. Pain Score  0 *  Have you tolerated food without any problems? Yes.    Have you been able to return to your normal activities? Yes.    Do you have any questions about your discharge instructions: Diet   No. Medications  No. Follow up visit  No.  Do you have questions or concerns about your Care? No.  Actions: * If pain score is 4 or above: No action needed, pain <4.   1. Have you developed a fever since your procedure? no  2.   Have you had an respiratory symptoms (SOB or cough) since your procedure? no  3.   Have you tested positive for COVID 19 since your procedure no  4.   Have you had any family members/close contacts diagnosed with the COVID 19 since your procedure?  no   If yes to any of these questions please route to Joylene John, RN and Alphonsa Gin, Therapist, sports.

## 2020-01-17 ENCOUNTER — Ambulatory Visit (HOSPITAL_COMMUNITY)
Admission: EM | Admit: 2020-01-17 | Discharge: 2020-01-17 | Disposition: A | Discharge status: Home / Self Care | Payer: Medicare Other | Attending: Internal Medicine | Admitting: Internal Medicine

## 2020-01-17 ENCOUNTER — Ambulatory Visit (INDEPENDENT_AMBULATORY_CARE_PROVIDER_SITE_OTHER): Payer: Medicare Other

## 2020-01-17 ENCOUNTER — Encounter (HOSPITAL_COMMUNITY): Payer: Self-pay

## 2020-01-17 DIAGNOSIS — S20212A Contusion of left front wall of thorax, initial encounter: Secondary | ICD-10-CM | POA: Diagnosis not present

## 2020-01-17 DIAGNOSIS — R0781 Pleurodynia: Secondary | ICD-10-CM | POA: Diagnosis not present

## 2020-01-17 DIAGNOSIS — S299XXA Unspecified injury of thorax, initial encounter: Secondary | ICD-10-CM | POA: Diagnosis not present

## 2020-01-17 MED ORDER — ACETAMINOPHEN 500 MG PO TABS: 500.0000 mg | ORAL_TABLET | Freq: Four times a day (QID) | ORAL | 0 refills | Status: DC | PRN

## 2020-01-17 MED ORDER — IBUPROFEN 400 MG PO TABS: 400.0000 mg | ORAL_TABLET | Freq: Four times a day (QID) | ORAL | 0 refills | Status: DC | PRN

## 2020-01-17 NOTE — ED Triage Notes (Signed)
Pt was walking her dog outside when she slipped on her deck and fall. Pt is complaining about left side pain from the fall.

## 2020-01-17 NOTE — ED Provider Notes (Addendum)
Alison Campbell    CSN: [id number] Arrival date & time: 01/17/20  0825      History   Chief Complaint Chief Complaint  Patient presents with  . Fall  . Back Pain    HPI Alison Campbell is a 74 y.o. female comes to urgent care with complaints of left-sided chest pain which started 2 days ago.  Pain is severe, currently 8 out of 10, aggravated by movement, relieved partially by Tylenol/Motrin/heating pad and no radiation.  Not associated with shortness of breath, dizziness or feeling faint.  Patient did not hit her head and denies any headaches.   HPI  Past Medical History:  Diagnosis Date  . Anxiety   . Colon polyps   . Costochondritis   . GERD (gastroesophageal reflux disease)    coffee related  . Hyperlipidemia   . Insomnia   . Osteoporosis   . Pre-diabetes     Patient Active Problem List   Diagnosis Date Noted  . Insomnia   . Dyshidrotic foot dermatitis   . Aortic atherosclerosis ([place]) 03/11/2018  . Tobacco use disorder 06/21/2015  . Osteoporosis 06/21/2015  . Medication management 02/02/2015  . Essential hypertension 11/07/2013  . Hyperlipidemia, mixed 11/07/2013  . Abnormal glucose 11/07/2013  . Chronic bronchitis ([place]) 11/07/2013  . Vitamin D deficiency 11/07/2013  . Diverticulosis of large intestine 09/10/2011  . Colon polyp 09/10/2011    Past Surgical History:  Procedure Laterality Date  . ABDOMINAL HYSTERECTOMY    . COLONOSCOPY  09/10/2011  . hysterectomy    . POLYPECTOMY    . UVULECTOMY N/A 09/09/2016   Procedure: PARTIAL UVULECTOMY, direct laryngoscopy;  Surgeon: Rozetta Nunnery, MD;  Location: [place];  Service: ENT;  Laterality: N/A;  PARTIAL UVULECTOMY, direct laryngoscopy    OB History   No obstetric history on file.      Home Medications    Prior to Admission medications   Medication Sig Start Date End Date Taking? Authorizing Provider  acetaminophen (TYLENOL) 500 MG tablet Take 1 tablet (500 mg total)  by mouth every 6 (six) hours as needed. 01/17/20   LampteyMyrene Galas, MD  alendronate (FOSAMAX) 70 MG tablet TAKE 1 TABLET BY MOUTH EVERY WEEK ON AN EMPTY STOMACH WITH A FULL GLASS OF WATER FOR ONE HOUR 10/03/19   [name], MD  ALPRAZolam Duanne Moron) 1 MG tablet 1/2 to 1 tablet 2 to 3 x daily as needed for anxiety or sleep 05/19/16   Rolene Course, PA-C  aspirin 81 MG EC tablet Take 1 tablet daily 08/30/15   [name], MD  CALCIUM PO Take 2 tablets by mouth daily. 600 mg  2 per day    [provider]  Cholecalciferol (VITAMIN D PO) Take 2,000 Units by mouth daily. Take 5 caps to = 10,000 units daily.    [provider]  Cyanocobalamin (VITAMIN B 12 PO) Take 1 tablet by mouth daily.    [provider]  dexamethasone (DECADRON) 4 MG tablet Take 1 tab 3 x day - 3 days, then 2 x day - 3 days, then 1 tab daily Patient not taking: Reported on 01/09/2020 12/12/19   [name], MD  ezetimibe (ZETIA) 10 MG tablet Take 1 tablet Daily for Cholesterol. 11/08/19   [name], MD  ibuprofen (ADVIL) 400 MG tablet Take 1 tablet (400 mg total) by mouth every 6 (six) hours as needed. 01/17/20   Chase Picket, MD  Multiple Vitamin (MULTIVITAMIN PO) Take 1 tablet  by mouth daily.     [provider]  Psyllium (METAMUCIL FIBER PO) Take by mouth daily.    [provider]    Family History Family History  Problem Relation Age of Onset  . Heart disease Mother   . Heart attack Mother   . Heart disease Father   . Diabetes Father   . Heart attack Father   . Cancer Brother        Lung  . Bipolar disorder Daughter   . Colon cancer Neg Hx   . Esophageal cancer Neg Hx   . Stomach cancer Neg Hx   . Rectal cancer Neg Hx     Social History Social History   Tobacco Use  . Smoking status: Current Every Day Smoker    Packs/day: 0.50    Years: 50.00    Pack years: 25.00    Types: Cigarettes  . Smokeless tobacco: Never Used  Substance Use Topics    . Alcohol use: No    Alcohol/week: 0.0 standard drinks  . Drug use: No     Allergies   Fish oil   Review of Systems Review of Systems  Constitutional: Negative for activity change, chills, fatigue and fever.  HENT: Negative for hearing loss, rhinorrhea and sore throat.   Respiratory: Negative for cough, chest tightness and shortness of breath.   Cardiovascular: Positive for chest pain.  Gastrointestinal: Negative for nausea and vomiting.  Genitourinary: Negative.   Musculoskeletal: Positive for arthralgias and myalgias. Negative for neck pain.  Skin: Negative.   Neurological: Negative for dizziness, light-headedness and headaches.  Psychiatric/Behavioral: Negative for agitation, confusion and decreased concentration.     Physical Exam Triage Vital Signs ED Triage Vitals [01/17/20 0850]  Enc Vitals Group     BP (!) 144/85     Pulse Rate 86     Resp 18     Temp 97.9 F (36.6 C)     Temp Source Oral     SpO2 98 %     Weight      Height      Head Circumference      Peak Flow      Pain Score 8     Pain Loc      Pain Edu?      Excl. in [place]?    No data found.  Updated Vital Signs BP (!) 144/85 (BP Location: Left Arm)   Pulse 86   Temp 97.9 F (36.6 C) (Oral)   Resp 18   SpO2 98%   Visual Acuity Right Eye Distance:   Left Eye Distance:   Bilateral Distance:    Right Eye Near:   Left Eye Near:    Bilateral Near:     Physical Exam Vitals and nursing note reviewed.  Constitutional:      General: She is in acute distress.     Appearance: Normal appearance. She is not ill-appearing.  Eyes:     Extraocular Movements: Extraocular movements intact.     Conjunctiva/sclera: Conjunctivae normal.  Cardiovascular:     Rate and Rhythm: Normal rate and regular rhythm.     Pulses: Normal pulses.     Heart sounds: No murmur. No friction rub.  Pulmonary:     Effort: Pulmonary effort is normal.     Breath sounds: No stridor.  Abdominal:     General: Bowel sounds  are normal. There is no distension.     Palpations: Abdomen is soft.     Tenderness: There is  no abdominal tenderness.  Musculoskeletal:     Cervical back: Normal range of motion. No rigidity or tenderness.     Comments: Tenderness over the left lower chest with maximum point tenderness in the region of the 8-10 ribs  Skin:    General: Skin is warm.     Capillary Refill: Capillary refill takes less than 2 seconds.     Findings: No bruising.  Neurological:     General: No focal deficit present.     Mental Status: She is alert and oriented to person, place, and time.      UC Treatments / Results  Labs (all labs ordered are listed, but only abnormal results are displayed) Labs Reviewed - No data to display  EKG   Radiology DG Ribs Unilateral W/Chest Left  Result Date: 01/17/2020 CLINICAL DATA:  74 year old female with history of trauma from a fall complaining of left-sided rib pain. EXAM: LEFT RIBS AND CHEST - 3+ VIEW COMPARISON:  Chest x-ray 02/14/2015. FINDINGS: Lung volumes are normal. Mild chronic linear scarring in the periphery of the left lung base, unchanged compared to 2016. No consolidative airspace disease. No pleural effusions. No pneumothorax. No pulmonary nodule or mass noted. Chronic bilateral apical pleuroparenchymal thickening and architectural distortion, similar to the prior study, most compatible with areas of chronic post infectious or inflammatory scarring. Pulmonary vasculature and the cardiomediastinal silhouette are within normal limits. Aortic atherosclerosis. Dedicated views of the left ribs demonstrate no acute displaced left-sided rib fractures. IMPRESSION: 1. No acute displaced left-sided rib fractures. 2. No radiographic evidence of acute cardiopulmonary disease. 3. Aortic atherosclerosis. 4. Mild chronic linear scarring in the left lung base, stable compared to prior studies. Electronically Signed   By: Vinnie Langton M.D.   On: 01/17/2020 09:14     Procedures Procedures (including critical care time)  Medications Ordered in UC Medications - No data to display  Initial Impression / Assessment and Plan / UC Course  I have reviewed the triage vital signs and the nursing notes.  Pertinent labs & imaging results that were available during my care of the patient were reviewed by me and considered in my medical decision making (see chart for details).     1.  Left rib contusion: Continue over-the-counter Tylenol/Motrin Warm compress Chest x-ray is negative for rib fracture If patient symptoms worsens she is welcome to return to urgent care to be reevaluated.   Final Clinical Impressions(s) / UC Diagnoses   Final diagnoses:  Rib contusion, left, initial encounter   Discharge Instructions   None    ED Prescriptions    Medication Sig Dispense Auth. Provider   ibuprofen (ADVIL) 400 MG tablet Take 1 tablet (400 mg total) by mouth every 6 (six) hours as needed. 30 tablet Linken Mcglothen, Myrene Galas, MD   acetaminophen (TYLENOL) 500 MG tablet Take 1 tablet (500 mg total) by mouth every 6 (six) hours as needed. 30 tablet Ilsa Bonello, Myrene Galas, MD     PDMP not reviewed this encounter.   Chase Picket, MD 01/17/20 1010    Chase Picket, MD 01/17/20 1010

## 2020-01-24 ENCOUNTER — Ambulatory Visit: Payer: Medicare Other

## 2020-02-02 ENCOUNTER — Ambulatory Visit: Payer: Medicare Other | Attending: Internal Medicine

## 2020-02-02 DIAGNOSIS — Z23 Encounter for immunization: Secondary | ICD-10-CM | POA: Insufficient documentation

## 2020-02-02 NOTE — Progress Notes (Signed)
   Covid-19 Vaccination Clinic  Name:  Alison Campbell    MRN: [id number] DOB: 03-Jul-1946  02/02/2020  Alison Campbell was observed post Covid-19 immunization for 15 minutes without incidence. She was provided with Vaccine Information Sheet and instruction to access the V-Safe system.   Alison Campbell was instructed to call 911 with any severe reactions post vaccine: Marland Kitchen Difficulty breathing  . Swelling of your face and throat  . A fast heartbeat  . A bad rash all over your body  . Dizziness and weakness    Immunizations Administered    Name Date Dose VIS Date Route   Pfizer COVID-19 Vaccine 02/02/2020  5:37 PM 0.3 mL 12/09/2019 Intramuscular   Manufacturer: [place]   Lot: [id number]   [place]: [id number]

## 2020-02-28 ENCOUNTER — Ambulatory Visit: Payer: Medicare Other | Attending: Internal Medicine

## 2020-02-28 DIAGNOSIS — Z23 Encounter for immunization: Secondary | ICD-10-CM

## 2020-02-28 NOTE — Progress Notes (Signed)
   Covid-19 Vaccination Clinic  Name:  BRADI ERKKILA    MRN: [id number] DOB: December 14, 1946  02/28/2020  Ms. Patella was observed post Covid-19 immunization for 15 minutes without incident. She was provided with Vaccine Information Sheet and instruction to access the V-Safe system.   Ms. Pigue was instructed to call 911 with any severe reactions post vaccine: Marland Kitchen Difficulty breathing  . Swelling of face and throat  . A fast heartbeat  . A bad rash all over body  . Dizziness and weakness   Immunizations Administered    Name Date Dose VIS Date Route   Pfizer COVID-19 Vaccine 02/28/2020  9:59 AM 0.3 mL 12/09/2019 Intramuscular   Manufacturer: [place]   Lot: [id number]   [place]: [id number]

## 2020-03-06 ENCOUNTER — Ambulatory Visit: Payer: Medicare Other | Admitting: Internal Medicine

## 2020-05-24 DIAGNOSIS — L308 Other specified dermatitis: Secondary | ICD-10-CM | POA: Diagnosis not present

## 2020-05-24 DIAGNOSIS — D485 Neoplasm of uncertain behavior of skin: Secondary | ICD-10-CM | POA: Diagnosis not present

## 2020-06-17 ENCOUNTER — Encounter: Payer: Self-pay | Admitting: Internal Medicine

## 2020-06-17 NOTE — Patient Instructions (Signed)
Due to recent changes in healthcare laws, you may see the results of your imaging and laboratory studies on MyChart before your provider has had a chance to review them.  We understand that in some cases there may be results that are confusing or concerning to you. Not all laboratory results come back in the same time frame and the provider may be waiting for multiple results in order to interpret others.  Please give [place] 48 hours in order for your provider to thoroughly review all the results before contacting the office for clarification of your results.   +++++++++++++++++++++++++  Vit D  & Vit C 1,000 mg   are recommended to help protect  against the Covid-19 and other Corona viruses.    Also it's recommended  to take  Zinc 50 mg  to help  protect against the Covid-19   and best place to get  is also on [email or web address]  and don't pay more than 6-8 cents /pill !  ================================ Coronavirus (COVID-19) Are you at risk?  Are you at risk for the Coronavirus (COVID-19)?  To be considered HIGH RISK for Coronavirus (COVID-19), you have to meet the following criteria:  . Traveled to [place], [place], [place], [place] or [place]; or in the [place] to [place], [place], [place]  . or [place]; and have fever, cough, and shortness of breath within the last 2 weeks of travel OR . Been in close contact with a person diagnosed with COVID-19 within the last 2 weeks and have  . fever, cough,and shortness of breath .  . IF YOU DO NOT MEET THESE CRITERIA, YOU ARE CONSIDERED LOW RISK FOR COVID-19.  What to do if you are HIGH RISK for COVID-19?  Marland Kitchen If you are having a medical emergency, call 911. . Seek medical care right away. Before you go to a doctor's office, urgent care or emergency department, .  call ahead and tell them about your recent travel, contact with someone diagnosed with COVID-19  .  and your symptoms.  . You should receive instructions from your physician's  office regarding next steps of care.  . When you arrive at healthcare provider, tell the healthcare staff immediately you have returned from  . visiting [place], [place], [place], [place] or [place]; or traveled in the [place] to [place], [place],  . [place] or [place] in the last two weeks or you have been in close contact with a person diagnosed with  . COVID-19 in the last 2 weeks.   . Tell the health care staff about your symptoms: fever, cough and shortness of breath. . After you have been seen by a medical provider, you will be either: o Tested for (COVID-19) and discharged home on quarantine except to seek medical care if  o symptoms worsen, and asked to  - Stay home and avoid contact with others until you get your results (4-5 days)  - Avoid travel on public transportation if possible (such as bus, train, or airplane) or o Sent to the Emergency Department by EMS for evaluation, COVID-19 testing  and  o possible admission depending on your condition and test results.  What to do if you are LOW RISK for COVID-19?  Reduce your risk of any infection by using the same precautions used for avoiding the common cold or flu:  Marland Kitchen Wash your hands often with soap and warm water for at least 20 seconds.  If soap and water are not readily  available,  . use an alcohol-based hand sanitizer with at least 60% alcohol.  . If coughing or sneezing, cover your mouth and nose by coughing or sneezing into the elbow areas of your shirt or coat, .  into a tissue or into your sleeve (not your hands). . Avoid shaking hands with others and consider head nods or verbal greetings only. . Avoid touching your eyes, nose, or mouth with unwashed hands.  . Avoid close contact with people who are sick. . Avoid places or events with large numbers of people in one location, like concerts or sporting events. . Carefully consider travel plans you have or are making. . If you are planning any travel outside or  inside the [place], visit the CDC's Travelers' Health webpage for the latest health notices. . If you have some symptoms but not all symptoms, continue to monitor at home and seek medical attention  . if your symptoms worsen. . If you are having a medical emergency, call 911.   . >>>>>>>>>>>>>>>>>>>>>>>>>>>>>>>>> . We Do NOT Approve of  [facility], [employer]  Our Patients  To Do Home Visits & We Do NOT Approve of [employer] > > > > > > > > > > > > > > > > > > > > > > > > > > > > > > > > > > > > > > >  Preventive Care for Adults  A healthy lifestyle and preventive care can promote health and wellness. Preventive health guidelines for women include the following key practices.  A routine yearly physical is a good way to check with your health care provider about your health and preventive screening. It is a chance to share any concerns and updates on your health and to receive a thorough exam.  Visit your dentist for a routine exam and preventive care every 6 months. Brush your teeth twice a day and floss once a day. Good oral hygiene prevents tooth decay and gum disease.  The frequency of eye exams is based on your age, health, family medical history, use of contact lenses, and other factors. Follow your health care provider's recommendations for frequency of eye exams.  Eat a healthy diet. Foods like vegetables, fruits, whole grains, low-fat dairy products, and lean protein foods contain the nutrients you need without too many calories. Decrease your intake of foods high in solid fats, added sugars, and salt. Eat the right amount of calories for you. Get information about a proper diet from your health care provider, if necessary.  Regular physical exercise is one of the most important things you can do for your health. Most adults should get at least 150 minutes of moderate-intensity exercise (any activity that increases your heart rate and causes you to sweat) each  week. In addition, most adults need muscle-strengthening exercises on 2 or more days a week.  Maintain a healthy weight. The body mass index (BMI) is a screening tool to identify possible weight problems. It provides an estimate of body fat based on height and weight. Your health care provider can find your BMI and can help you achieve or maintain a healthy weight. For adults 20 years and older:  A BMI below 18.5 is considered underweight.  A BMI of 18.5 to 24.9 is normal.  A BMI of 25 to 29.9 is considered overweight.  A BMI of 30 and above is considered obese.  Maintain normal blood lipids and cholesterol levels by exercising and minimizing your intake of  saturated fat. Eat a balanced diet with plenty of fruit and vegetables. If your lipid or cholesterol levels are high, you are over 50, or you are at high risk for heart disease, you may need your cholesterol levels checked more frequently. Ongoing high lipid and cholesterol levels should be treated with medicines if diet and exercise are not working.  If you smoke, find out from your health care provider how to quit. If you do not use tobacco, do not start.  Lung cancer screening is recommended for adults aged 55-80 years who are at high risk for developing lung cancer because of a history of smoking. A yearly low-dose CT scan of the lungs is recommended for people who have at least a 30-pack-year history of smoking and are a current smoker or have quit within the past 15 years. A pack year of smoking is smoking an average of 1 pack of cigarettes a day for 1 year (for example: 1 pack a day for 30 years or 2 packs a day for 15 years). Yearly screening should continue until the smoker has stopped smoking for at least 15 years. Yearly screening should be stopped for people who develop a health problem that would prevent them from having lung cancer treatment.  Avoid use of street drugs. Do not share needles with anyone. Ask for help if you need  support or instructions about stopping the use of drugs.  High blood pressure causes heart disease and increases the risk of stroke.  Ongoing high blood pressure should be treated with medicines if weight loss and exercise do not work.  If you are 75-22 years old, ask your health care provider if you should take aspirin to prevent strokes.  Diabetes screening involves taking a blood sample to check your fasting blood sugar level. This should be done once every 3 years, after age 48, if you are within normal weight and without risk factors for diabetes. Testing should be considered at a younger age or be carried out more frequently if you are overweight and have at least 1 risk factor for diabetes.  Breast cancer screening is essential preventive care for women. You should practice "breast self-awareness." This means understanding the normal appearance and feel of your breasts and may include breast self-examination. Any changes detected, no matter how small, should be reported to a health care provider. Women in their 43s and 30s should have a clinical breast exam (CBE) by a health care provider as part of a regular health exam every 1 to 3 years. After age 55, women should have a CBE every year. Starting at age 58, women should consider having a mammogram (breast X-ray test) every year. Women who have a family history of breast cancer should talk to their health care provider about genetic screening. Women at a high risk of breast cancer should talk to their health care providers about having an MRI and a mammogram every year.  Breast cancer gene (BRCA)-related cancer risk assessment is recommended for women who have family members with BRCA-related cancers. BRCA-related cancers include breast, ovarian, tubal, and peritoneal cancers. Having family members with these cancers may be associated with an increased risk for harmful changes (mutations) in the breast cancer genes BRCA1 and BRCA2. Results of the  assessment will determine the need for genetic counseling and BRCA1 and BRCA2 testing.  Routine pelvic exams to screen for cancer are no longer recommended for nonpregnant women who are considered low risk for cancer of the pelvic organs (ovaries,  uterus, and vagina) and who do not have symptoms. Ask your health care provider if a screening pelvic exam is right for you.  If you have had past treatment for cervical cancer or a condition that could lead to cancer, you need Pap tests and screening for cancer for at least 20 years after your treatment. If Pap tests have been discontinued, your risk factors (such as having a new sexual partner) need to be reassessed to determine if screening should be resumed. Some women have medical problems that increase the chance of getting cervical cancer. In these cases, your health care provider may recommend more frequent screening and Pap tests.    Colorectal cancer can be detected and often prevented. Most routine colorectal cancer screening begins at the age of 62 years and continues through age 28 years. However, your health care provider may recommend screening at an earlier age if you have risk factors for colon cancer. On a yearly basis, your health care provider may provide home test kits to check for hidden blood in the stool. Use of a small camera at the end of a tube, to directly examine the colon (sigmoidoscopy or colonoscopy), can detect the earliest forms of colorectal cancer. Talk to your health care provider about this at age 23, when routine screening begins.  Direct exam of the colon should be repeated every 5-10 years through age 31 years, unless early forms of pre-cancerous polyps or small growths are found.  Osteoporosis is a disease in which the bones lose minerals and strength with aging. This can result in serious bone fractures or breaks. The risk of osteoporosis can be identified using a bone density scan. Women ages 83 years and over and women  at risk for fractures or osteoporosis should discuss screening with their health care providers. Ask your health care provider whether you should take a calcium supplement or vitamin D to reduce the rate of osteoporosis.  Menopause can be associated with physical symptoms and risks. Hormone replacement therapy is available to decrease symptoms and risks. You should talk to your health care provider about whether hormone replacement therapy is right for you.  Use sunscreen. Apply sunscreen liberally and repeatedly throughout the day. You should seek shade when your shadow is shorter than you. Protect yourself by wearing long sleeves, pants, a wide-brimmed hat, and sunglasses year round, whenever you are outdoors.  Once a month, do a whole body skin exam, using a mirror to look at the skin on your back. Tell your health care provider of new moles, moles that have irregular borders, moles that are larger than a pencil eraser, or moles that have changed in shape or color.  Stay current with required vaccines (immunizations).  Influenza vaccine. All adults should be immunized every year.  Tetanus, diphtheria, and acellular pertussis (Td, Tdap) vaccine. Pregnant women should receive 1 dose of Tdap vaccine during each pregnancy. The dose should be obtained regardless of the length of time since the last dose. Immunization is preferred during the 27th-36th week of gestation. An adult who has not previously received Tdap or who does not know her vaccine status should receive 1 dose of Tdap. This initial dose should be followed by tetanus and diphtheria toxoids (Td) booster doses every 10 years. Adults with an unknown or incomplete history of completing a 3-dose immunization series with Td-containing vaccines should begin or complete a primary immunization series including a Tdap dose. Adults should receive a Td booster every 10 years.  Zoster vaccine. One dose is recommended for adults aged 64 years or  older unless certain conditions are present.    Pneumococcal 13-valent conjugate (PCV13) vaccine. When indicated, a person who is uncertain of her immunization history and has no record of immunization should receive the PCV13 vaccine. An adult aged 61 years or older who has certain medical conditions and has not been previously immunized should receive 1 dose of PCV13 vaccine. This PCV13 should be followed with a dose of pneumococcal polysaccharide (PPSV23) vaccine. The PPSV23 vaccine dose should be obtained at least 1 or more year(s) after the dose of PCV13 vaccine. An adult aged 46 years or older who has certain medical conditions and previously received 1 or more doses of PPSV23 vaccine should receive 1 dose of PCV13. The PCV13 vaccine dose should be obtained 1 or more years after the last PPSV23 vaccine dose.    Pneumococcal polysaccharide (PPSV23) vaccine. When PCV13 is also indicated, PCV13 should be obtained first. All adults aged 58 years and older should be immunized. An adult younger than age 1 years who has certain medical conditions should be immunized. Any person who resides in a nursing home or long-term care facility should be immunized. An adult smoker should be immunized. People with an immunocompromised condition and certain other conditions should receive both PCV13 and PPSV23 vaccines. People with human immunodeficiency virus (HIV) infection should be immunized as soon as possible after diagnosis. Immunization during chemotherapy or radiation therapy should be avoided. Routine use of PPSV23 vaccine is not recommended for American Indians, [place] Natives, or people younger than 65 years unless there are medical conditions that require PPSV23 vaccine. When indicated, people who have unknown immunization and have no record of immunization should receive PPSV23 vaccine. One-time revaccination 5 years after the first dose of PPSV23 is recommended for people aged 19-64 years who have chronic  kidney failure, nephrotic syndrome, asplenia, or immunocompromised conditions. People who received 1-2 doses of PPSV23 before age 11 years should receive another dose of PPSV23 vaccine at age 70 years or later if at least 5 years have passed since the previous dose. Doses of PPSV23 are not needed for people immunized with PPSV23 at or after age 65 years.   Preventive Services / Frequency  Ages 56 years and over  Blood pressure check.  Lipid and cholesterol check.  Lung cancer screening. / Every year if you are aged 16-80 years and have a 30-pack-year history of smoking and currently smoke or have quit within the past 15 years. Yearly screening is stopped once you have quit smoking for at least 15 years or develop a health problem that would prevent you from having lung cancer treatment.  Clinical breast exam.** / Every year after age 59 years.   BRCA-related cancer risk assessment.** / For women who have family members with a BRCA-related cancer (breast, ovarian, tubal, or peritoneal cancers).  Mammogram.** / Every year beginning at age 70 years and continuing for as long as you are in good health. Consult with your health care provider.  Pap test.** / Every 3 years starting at age 32 years through age 28 or 9 years with 3 consecutive normal Pap tests. Testing can be stopped between 65 and 70 years with 3 consecutive normal Pap tests and no abnormal Pap or HPV tests in the past 10 years.  Fecal occult blood test (FOBT) of stool. / Every year beginning at age 57 years and continuing until age 29 years. You may not need to  do this test if you get a colonoscopy every 10 years.  Flexible sigmoidoscopy or colonoscopy.** / Every 5 years for a flexible sigmoidoscopy or every 10 years for a colonoscopy beginning at age 21 years and continuing until age 56 years.  Hepatitis C blood test.** / For all people born from [birth year] through 1967 and any individual with known risks for hepatitis  C.  Osteoporosis screening.** / A one-time screening for women ages 53 years and over and women at risk for fractures or osteoporosis.  Skin self-exam. / Monthly.  Influenza vaccine. / Every year.  Tetanus, diphtheria, and acellular pertussis (Tdap/Td) vaccine.** / 1 dose of Td every 10 years.  Zoster vaccine.** / 1 dose for adults aged 32 years or older.  Pneumococcal 13-valent conjugate (PCV13) vaccine.** / Consult your health care provider.  Pneumococcal polysaccharide (PPSV23) vaccine.** / 1 dose for all adults aged 67 years and older. Screening for abdominal aortic aneurysm (AAA)  by ultrasound is recommended for people who have history of high blood pressure or who are current or former smokers. ++++++++++++++++++++ Recommend Adult Low Dose Aspirin or  coated  Aspirin 81 mg daily  To reduce risk of Colon Cancer 40 %,  Skin Cancer 26 % ,  Melanoma 46%  and  Pancreatic cancer 60% ++++++++++++++++++++ Vitamin D goal  is between 70-100.  Please make sure that you are taking your Vitamin D as directed.  It is very important as a natural anti-inflammatory  helping hair, skin, and nails, as well as reducing stroke and heart attack risk.  It helps your bones and helps with mood. It also decreases numerous cancer risks so please take it as directed.  Low Vit D is associated with a 200-300% higher risk for CANCER  and 200-300% higher risk for HEART   ATTACK  &  STROKE.   .....................................Marland Kitchen It is also associated with higher death rate at younger ages,  autoimmune diseases like Rheumatoid arthritis, Lupus, Multiple Sclerosis.    Also many other serious conditions, like depression, Alzheimer's Dementia, infertility, muscle aches, fatigue, fibromyalgia - just to name a few. ++++++++++++++++++ Recommend the book "The END of DIETING" by Dr Excell Seltzer  & the book "The END of DIABETES " by Dr Excell Seltzer At [email or web address] - get book & Audio CD's    Being diabetic has  a  300% increased risk for heart attack, stroke, cancer, and alzheimer- type vascular dementia. It is very important that you work harder with diet by avoiding all foods that are white. Avoid white rice (brown & wild rice is OK), white potatoes (sweetpotatoes in moderation is OK), White bread or wheat bread or anything made out of white flour like bagels, donuts, rolls, buns, biscuits, cakes, pastries, cookies, pizza crust, and pasta (made from white flour & egg whites) - vegetarian pasta or spinach or wheat pasta is OK. Multigrain breads like Arnold's or [employer], or multigrain sandwich thins or flatbreads.  Diet, exercise and weight loss can reverse and cure diabetes in the early stages.  Diet, exercise and weight loss is very important in the control and prevention of complications of diabetes which affects every system in your body, ie. Brain - dementia/stroke, eyes - glaucoma/blindness, heart - heart attack/heart failure, kidneys - dialysis, stomach - gastric paralysis, intestines - malabsorption, nerves - severe painful neuritis, circulation - gangrene & loss of a leg(s), and finally cancer and Alzheimers.    I recommend avoid fried & greasy foods,  sweets/candy, white rice (brown or wild  rice or Quinoa is OK), white potatoes (sweet potatoes are OK) - anything made from white flour - bagels, doughnuts, rolls, buns, biscuits,white and wheat breads, pizza crust and traditional pasta made of white flour & egg white(vegetarian pasta or spinach or wheat pasta is OK).  Multi-grain bread is OK - like multi-grain flat bread or sandwich thins. Avoid alcohol in excess. Exercise is also important.    Eat all the vegetables you want - avoid meat, especially red meat and dairy - especially cheese.  Cheese is the most concentrated form of trans-fats which is the worst thing to clog up our arteries. Veggie cheese is OK which can be found in the fresh produce section at [employer] or [employer] or  [employer]  +++++++++++++++++++ DASH Eating Plan  DASH stands for "Dietary Approaches to Stop Hypertension."   The DASH eating plan is a healthy eating plan that has been shown to reduce high blood pressure (hypertension). Additional health benefits may include reducing the risk of type 2 diabetes mellitus, heart disease, and stroke. The DASH eating plan may also help with weight loss. WHAT DO I NEED TO KNOW ABOUT THE DASH EATING PLAN? For the DASH eating plan, you will follow these general guidelines:  Choose foods with a percent daily value for sodium of less than 5% (as listed on the food label).  Use salt-free seasonings or herbs instead of table salt or sea salt.  Check with your health care provider or pharmacist before using salt substitutes.  Eat lower-sodium products, often labeled as "lower sodium" or "no salt added."  Eat fresh foods.  Eat more vegetables, fruits, and low-fat dairy products.  Choose whole grains. Look for the word "whole" as the first word in the ingredient list.  Choose fish   Limit sweets, desserts, sugars, and sugary drinks.  Choose heart-healthy fats.  Eat veggie cheese   Eat more home-cooked food and less restaurant, buffet, and fast food.  Limit fried foods.  Cook foods using methods other than frying.  Limit canned vegetables. If you do use them, rinse them well to decrease the sodium.  When eating at a restaurant, ask that your food be prepared with less salt, or no salt if possible.                      WHAT FOODS CAN I EAT? Read Dr Fara Olden Fuhrman's books on The End of Dieting & The End of Diabetes  Grains Whole grain or whole wheat bread. Brown rice. Whole grain or whole wheat pasta. Quinoa, bulgur, and whole grain cereals. Low-sodium cereals. Corn or whole wheat flour tortillas. Whole grain cornbread. Whole grain crackers. Low-sodium crackers.  Vegetables Fresh or frozen vegetables (raw, steamed, roasted, or grilled). Low-sodium or  reduced-sodium tomato and vegetable juices. Low-sodium or reduced-sodium tomato sauce and paste. Low-sodium or reduced-sodium canned vegetables.   Fruits All fresh, canned (in natural juice), or frozen fruits.  Protein Products  All fish and seafood.  Dried beans, peas, or lentils. Unsalted nuts and seeds. Unsalted canned beans.  Dairy Low-fat dairy products, such as skim or 1% milk, 2% or reduced-fat cheeses, low-fat ricotta or cottage cheese, or plain low-fat yogurt. Low-sodium or reduced-sodium cheeses.  Fats and Oils Tub margarines without trans fats. Light or reduced-fat mayonnaise and salad dressings (reduced sodium). Avocado. Safflower, olive, or canola oils. Natural peanut or almond butter.  Other Unsalted popcorn and pretzels. The items listed above may not be a complete list of recommended foods  or beverages. Contact your dietitian for more options.  +++++++++++++++  WHAT FOODS ARE NOT RECOMMENDED? Grains/ White flour or wheat flour White bread. White pasta. White rice. Refined cornbread. Bagels and croissants. Crackers that contain trans fat.  Vegetables  Creamed or fried vegetables. Vegetables in a . Regular canned vegetables. Regular canned tomato sauce and paste. Regular tomato and vegetable juices.  Fruits Dried fruits. Canned fruit in light or heavy syrup. Fruit juice.  Meat and Other Protein Products Meat in general - RED meat & White meat.  Fatty cuts of meat. Ribs, chicken wings, all processed meats as bacon, sausage, bologna, salami, fatback, hot dogs, bratwurst and packaged luncheon meats.  Dairy Whole or 2% milk, cream, half-and-half, and cream cheese. Whole-fat or sweetened yogurt. Full-fat cheeses or blue cheese. Non-dairy creamers and whipped toppings. Processed cheese, cheese spreads, or cheese curds.  Condiments Onion and garlic salt, seasoned salt, table salt, and sea salt. Canned and packaged gravies. Worcestershire sauce. Tartar sauce. Barbecue  sauce. Teriyaki sauce. Soy sauce, including reduced sodium. Steak sauce. Fish sauce. Oyster sauce. Cocktail sauce. Horseradish. Ketchup and mustard. Meat flavorings and tenderizers. Bouillon cubes. Hot sauce. Tabasco sauce. Marinades. Taco seasonings. Relishes.  Fats and Oils Butter, stick margarine, lard, shortening and bacon fat. Coconut, palm kernel, or palm oils. Regular salad dressings.  Pickles and olives. Salted popcorn and pretzels.  The items listed above may not be a complete list of foods and beverages to avoid.

## 2020-06-17 NOTE — Progress Notes (Signed)
Comprehensive Evaluation &  Examination     This very nice 74 y.o. MWF presents for a  comprehensive evaluation and management of multiple medical co-morbidities.  Patient has been followed for labile HTN, HLD, Prediabetes  and Vitamin D Deficiency.   Patient has a 50+ yr smoking hx consequent COPD  Last year, she had negative LD screening Chest CT. Unfortunately, she continues to smoke. She is agreeable to undergo another screening low dose CT scan of the chest. We discussed smoking cessation techniques/options.     Patient has been followed expectantly for years for labile HTN . Patient's BP has been controlled at home and patient denies any cardiac symptoms as chest pain, palpitations, shortness of breath, dizziness or ankle swelling.  She had a negative stress Myoview (EF 65%) in  2019.  Today's BP was initially slightly elevated and recheck at goal - 134/78      Patient's is questionably statin intolerant (stopped for ? diarrhea) & her hyperlipidemia is not controlled with diet and ezetimibe. Patient denies myalgias or other medication SE's. Last lipids were not at goal:  Lab Results  Component Value Date   CHOL 228 (H) 08/18/2019   HDL 40 (L) 08/18/2019   LDLCALC 133 (H) 08/18/2019   TRIG 386 (H) 08/18/2019   CHOLHDL 5.7 (H) 08/18/2019       Patient has hx/o prediabetes (A1c 6.0% / 2011) and patient denies reactive hypoglycemic symptoms, visual blurring, diabetic polys or paresthesias. Last A1c was Normal & at goal:  Lab Results  Component Value Date   HGBA1C 5.2 05/16/2019       Finally, patient has history of Vitamin D Deficiency ("35" / 2008)  and last Vitamin D was at goal:  Lab Results  Component Value Date   VD25OH 77 05/16/2019    Current Outpatient Medications on File Prior to Visit  Medication Sig  . acetaminophen (TYLENOL) 500 MG tablet Take 1 tablet (500 mg total) by mouth every 6 (six) hours as needed.  Marland Kitchen alendronate (FOSAMAX) 70 MG tablet TAKE 1 TABLET BY  MOUTH EVERY WEEK ON AN EMPTY STOMACH WITH A FULL GLASS OF WATER FOR ONE HOUR  . ALPRAZolam (XANAX) 1 MG tablet 1/2 to 1 tablet 2 to 3 x daily as needed for anxiety or sleep  . aspirin 81 MG EC tablet Take 1 tablet daily  . CALCIUM PO Take 2 tablets by mouth daily. 600 mg  2 per day  . Cholecalciferol (VITAMIN D PO) Take 2,000 Units by mouth daily. Take 5 caps to = 10,000 units daily.  . Cyanocobalamin (VITAMIN B 12 PO) Take 1 tablet by mouth daily.  Marland Kitchen ezetimibe (ZETIA) 10 MG tablet Take 1 tablet Daily for Cholesterol.  Marland Kitchen ibuprofen (ADVIL) 400 MG tablet Take 1 tablet (400 mg total) by mouth every 6 (six) hours as needed.  . Multiple Vitamin (MULTIVITAMIN PO) Take 1 tablet by mouth daily.   . Psyllium (METAMUCIL FIBER PO) Take by mouth daily.   No current facility-administered medications on file prior to visit.   Allergies  Allergen Reactions  . Fish Oil Diarrhea   Past Medical History:  Diagnosis Date  . Anxiety   . Colon polyps   . Costochondritis   . GERD (gastroesophageal reflux disease)    coffee related  . Hyperlipidemia   . Insomnia   . Osteoporosis   . Pre-diabetes    Health Maintenance  Topic Date Due  . Hepatitis C Screening  Never done  . TETANUS/TDAP  02/27/2019  .  INFLUENZA VACCINE  07/29/2020  . COLONOSCOPY  01/08/2023  . DEXA SCAN  Completed  . COVID-19 Vaccine  Completed  . PNA vac Low Risk Adult  Completed   Immunization History  Administered Date(s) Administered  . Influenza, High Dose Seasonal PF 08/29/2013, 10/25/2015, 11/24/2017, 11/29/2018  . PFIZER SARS-COV-2 Vaccination 02/02/2020, 02/28/2020  . Pneumococcal Conjugate-13 02/02/2015  . Pneumococcal Polysaccharide-23 03/29/2010  . Tdap 02/26/2009  . Zoster 12/29/2008    Last Colon - 01/09/2020- Dr Ardis Hughs - recc 3 yr f/u due Jan 2024  Last MGM - 07/06/2019  Past Surgical History:  Procedure Laterality Date  . ABDOMINAL HYSTERECTOMY    . COLONOSCOPY  09/10/2011  . hysterectomy    .  POLYPECTOMY    . UVULECTOMY N/A 09/09/2016   Procedure: PARTIAL UVULECTOMY, direct laryngoscopy;  Surgeon: Rozetta Nunnery, MD;  Location: [facility];  Service: ENT;  Laterality: N/A;  PARTIAL UVULECTOMY, direct laryngoscopy   Family History  Problem Relation Age of Onset  . Heart disease Mother   . Heart attack Mother   . Heart disease Father   . Diabetes Father   . Heart attack Father   . Cancer Brother        Lung  . Bipolar disorder Daughter   . Colon cancer Neg Hx   . Esophageal cancer Neg Hx   . Stomach cancer Neg Hx   . Rectal cancer Neg Hx    Social History   Tobacco Use  . Smoking status: Current Every Day Smoker    Packs/day: 0.50    Years: 50.00    Pack years: 25.00    Types: Cigarettes  . Smokeless tobacco: Never Used  Vaping Use  . Vaping Use: Never used  Substance Use Topics  . Alcohol use: No    Alcohol/week: 0.0 standard drinks  . Drug use: No    ROS Constitutional: Denies fever, chills, weight loss/gain, headaches, insomnia,  night sweats, and change in appetite. Does c/o fatigue. Eyes: Denies redness, blurred vision, diplopia, discharge, itchy, watery eyes.  ENT: Denies discharge, congestion, post nasal drip, epistaxis, sore throat, earache, hearing loss, dental pain, Tinnitus, Vertigo, Sinus pain, snoring.  Cardio: Denies chest pain, palpitations, irregular heartbeat, syncope, dyspnea, diaphoresis, orthopnea, PND, claudication, edema Respiratory: denies cough, dyspnea, DOE, pleurisy, hoarseness, laryngitis, wheezing.  Gastrointestinal: Denies dysphagia, heartburn, reflux, water brash, pain, cramps, nausea, vomiting, bloating, diarrhea, constipation, hematemesis, melena, hematochezia, jaundice, hemorrhoids Genitourinary: Denies dysuria, frequency, urgency, nocturia, hesitancy, discharge, hematuria, flank pain Breast: Breast lumps, nipple discharge, bleeding.  Musculoskeletal: Denies arthralgia, myalgia, stiffness, Jt. Swelling, pain,  limp, and strain/sprain. Denies falls. Skin: Denies puritis, rash, hives, warts, acne, eczema, changing in skin lesion Neuro: No weakness, tremor, incoordination, spasms, paresthesia, pain Psychiatric: Denies confusion, memory loss, sensory loss. Denies Depression. Endocrine: Denies change in weight, skin, hair change, nocturia, and paresthesia, diabetic polys, visual blurring, hyper / hypo glycemic episodes.  Heme/Lymph: No excessive bleeding, bruising, enlarged lymph nodes.  Physical Exam  BP 134/78   Pulse 80   Temp 97.6 F (36.4 C)   Resp 16   Ht 5\' 4"  (1.626 m)   Wt 150 lb (68 kg)   BMI 25.75 kg/m   General Appearance: Well nourished, well groomed and in no apparent distress.  Eyes: PERRLA, EOMs, conjunctiva no swelling or erythema, normal fundi and vessels. Sinuses: No frontal/maxillary tenderness ENT/Mouth: EACs patent / TMs  nl. Nares clear without erythema, swelling, mucoid exudates. Oral hygiene is good. No erythema, swelling, or exudate. Tongue  normal, non-obstructing. Tonsils not swollen or erythematous. Hearing normal.  Neck: Supple, thyroid not palpable. No bruits, nodes or JVD. Respiratory: Respiratory effort normal.  BS equal and clear bilateral without rales, rhonci, wheezing or stridor. Cardio: Heart sounds are normal with regular rate and rhythm and no murmurs, rubs or gallops. Peripheral pulses are normal and equal bilaterally without edema. No aortic or femoral bruits. Chest: symmetric with normal excursions and percussion. Breasts: Symmetric, without lumps, nipple discharge, retractions, or fibrocystic changes.  Abdomen: Flat, soft with bowel sounds active. Nontender, no guarding, rebound, hernias, masses, or organomegaly.  Lymphatics: Non tender without lymphadenopathy.  Musculoskeletal: Full ROM all peripheral extremities, joint stability, 5/5 strength, and normal gait. Skin: Warm and dry without rashes, lesions, cyanosis, clubbing or  ecchymosis.  Neuro:  Cranial nerves intact, reflexes equal bilaterally. Normal muscle tone, no cerebellar symptoms. Sensation intact.  Pysch: Alert and oriented X 3, normal affect, Insight and Judgment appropriate.   Assessment and Plan    1. Labile hypertension  - EKG 12-Lead - Urinalysis, Routine w reflex microscopic - Microalbumin / creatinine urine ratio - CBC with Differential/Platelet - COMPLETE METABOLIC PANEL WITH GFR - Magnesium - TSH  2. Hyperlipidemia, mixed  - EKG 12-Lead - Lipid panel - TSH  3. Abnormal glucose  - EKG 12-Lead - Hemoglobin A1c - Insulin, random  4. Vitamin D deficiency  - VITAMIN D 25 Hydroxy  5. Prediabetes  - EKG 12-Lead - Hemoglobin A1c - Insulin, random  6. Chronic bronchitis ([place])   7. Screening for colorectal cancer  - POC Hemoccult Bld/Stl   8. Encounter for screening for malignant neoplasm of respiratory organs  - CT CHEST LUNG CA SCREEN LOW DOSE W/O CM; Future  9. Screening for ischemic heart disease  - EKG 12-Lead  10. FHx: heart disease  - EKG 12-Lead  11. Smoker  - EKG 12-Lead - CT CHEST LUNG CA SCREEN LOW DOSE W/O CM; Future  12. Aortic atherosclerosis (HCC)  - EKG 12-Lead  13. Medication management  - Urinalysis, Routine w reflex microscopic - Microalbumin / creatinine urine ratio - CBC with Differential/Platelet - COMPLETE METABOLIC PANEL WITH GFR - Magnesium - Lipid panel - TSH - Hemoglobin A1c - Insulin, random - VITAMIN D 25 Hydroxy  14. Personal history of nicotine dependence   - CT CHEST LUNG CA SCREEN LOW DOSE W/O CM; Future        Patient was counseled in prudent diet to achieve/maintain BMI less than 25 for weight control, BP monitoring, regular exercise and medications. Discussed med's effects and SE's. Screening labs and tests as requested with regular follow-up as recommended. Over 40 minutes of exam, counseling, chart review and high complex critical decision making was performed.   Kirtland Bouchard, MD

## 2020-06-18 ENCOUNTER — Ambulatory Visit (INDEPENDENT_AMBULATORY_CARE_PROVIDER_SITE_OTHER): Payer: Medicare Other | Admitting: Internal Medicine

## 2020-06-18 ENCOUNTER — Other Ambulatory Visit: Payer: Self-pay | Admitting: Internal Medicine

## 2020-06-18 ENCOUNTER — Other Ambulatory Visit: Payer: Self-pay

## 2020-06-18 ENCOUNTER — Encounter: Payer: Self-pay | Admitting: Internal Medicine

## 2020-06-18 VITALS — BP 134/78 | HR 80 | Temp 97.6°F | Resp 16 | Ht 64.0 in | Wt 150.0 lb

## 2020-06-18 DIAGNOSIS — R7309 Other abnormal glucose: Secondary | ICD-10-CM | POA: Diagnosis not present

## 2020-06-18 DIAGNOSIS — R0989 Other specified symptoms and signs involving the circulatory and respiratory systems: Secondary | ICD-10-CM | POA: Diagnosis not present

## 2020-06-18 DIAGNOSIS — Z79899 Other long term (current) drug therapy: Secondary | ICD-10-CM

## 2020-06-18 DIAGNOSIS — E559 Vitamin D deficiency, unspecified: Secondary | ICD-10-CM

## 2020-06-18 DIAGNOSIS — E782 Mixed hyperlipidemia: Secondary | ICD-10-CM | POA: Diagnosis not present

## 2020-06-18 DIAGNOSIS — Z1211 Encounter for screening for malignant neoplasm of colon: Secondary | ICD-10-CM

## 2020-06-18 DIAGNOSIS — Z8249 Family history of ischemic heart disease and other diseases of the circulatory system: Secondary | ICD-10-CM | POA: Diagnosis not present

## 2020-06-18 DIAGNOSIS — I700 Atherosclerosis of aorta: Secondary | ICD-10-CM

## 2020-06-18 DIAGNOSIS — Z1231 Encounter for screening mammogram for malignant neoplasm of breast: Secondary | ICD-10-CM

## 2020-06-18 DIAGNOSIS — Z122 Encounter for screening for malignant neoplasm of respiratory organs: Secondary | ICD-10-CM

## 2020-06-18 DIAGNOSIS — J42 Unspecified chronic bronchitis: Secondary | ICD-10-CM

## 2020-06-18 DIAGNOSIS — Z136 Encounter for screening for cardiovascular disorders: Secondary | ICD-10-CM

## 2020-06-18 DIAGNOSIS — R7303 Prediabetes: Secondary | ICD-10-CM

## 2020-06-18 DIAGNOSIS — Z87891 Personal history of nicotine dependence: Secondary | ICD-10-CM

## 2020-06-18 DIAGNOSIS — F17200 Nicotine dependence, unspecified, uncomplicated: Secondary | ICD-10-CM

## 2020-06-19 LAB — COMPLETE METABOLIC PANEL WITH GFR
AG Ratio: 1.6 (calc) (ref 1.0–2.5)
ALT: 20 U/L (ref 6–29)
AST: 17 U/L (ref 10–35)
Albumin: 4.3 g/dL (ref 3.6–5.1)
Alkaline phosphatase (APISO): 54 U/L (ref 37–153)
BUN/Creatinine Ratio: 14 (calc) (ref 6–22)
BUN: 15 mg/dL (ref 7–25)
CO2: 26 mmol/L (ref 20–32)
Calcium: 9.9 mg/dL (ref 8.6–10.4)
Chloride: 106 mmol/L (ref 98–110)
Creat: 1.11 mg/dL — ABNORMAL HIGH (ref 0.60–0.93)
GFR, Est African American: 56 mL/min/{1.73_m2} — ABNORMAL LOW (ref >=60)
GFR, Est Non African American: 48 mL/min/{1.73_m2} — ABNORMAL LOW (ref >=60)
Globulin: 2.7 g/dL (calc) (ref 1.9–3.7)
Glucose, Bld: 83 mg/dL (ref 65–99)
Potassium: 4.4 mmol/L (ref 3.5–5.3)
Sodium: 141 mmol/L (ref 135–146)
Total Bilirubin: 0.4 mg/dL (ref 0.2–1.2)
Total Protein: 7 g/dL (ref 6.1–8.1)

## 2020-06-19 LAB — CBC WITH DIFFERENTIAL/PLATELET
Absolute Monocytes: 521 cells/uL (ref 200–950)
Basophils Absolute: 50 cells/uL (ref 0–200)
Basophils Relative: 0.6 %
Eosinophils Absolute: 227 cells/uL (ref 15–500)
Eosinophils Relative: 2.7 %
HCT: 44.9 % (ref 35.0–45.0)
Hemoglobin: 14.8 g/dL (ref 11.7–15.5)
Lymphs Abs: 2033 cells/uL (ref 850–3900)
MCH: 31 pg (ref 27.0–33.0)
MCHC: 33 g/dL (ref 32.0–36.0)
MCV: 93.9 fL (ref 80.0–100.0)
MPV: 11.4 fL (ref 7.5–12.5)
Monocytes Relative: 6.2 %
Neutro Abs: 5569 cells/uL (ref 1500–7800)
Neutrophils Relative %: 66.3 %
Platelets: 290 10*3/uL (ref 140–400)
RBC: 4.78 10*6/uL (ref 3.80–5.10)
RDW: 12.1 % (ref 11.0–15.0)
Total Lymphocyte: 24.2 %
WBC: 8.4 10*3/uL (ref 3.8–10.8)

## 2020-06-19 LAB — MICROALBUMIN / CREATININE URINE RATIO
Creatinine, Urine: 117 mg/dL (ref 20–275)
Microalb Creat Ratio: 5 mcg/mg creat (ref <30)
Microalb, Ur: 0.6 mg/dL

## 2020-06-19 LAB — LIPID PANEL
Cholesterol: 205 mg/dL — ABNORMAL HIGH (ref <200)
HDL: 41 mg/dL — ABNORMAL LOW (ref >=50)
LDL Cholesterol (Calc): 111 mg/dL (calc) — ABNORMAL HIGH
Non-HDL Cholesterol (Calc): 164 mg/dL (calc) — ABNORMAL HIGH (ref <130)
Total CHOL/HDL Ratio: 5 (calc) — ABNORMAL HIGH (ref <5.0)
Triglycerides: 373 mg/dL — ABNORMAL HIGH (ref <150)

## 2020-06-19 LAB — URINALYSIS, ROUTINE W REFLEX MICROSCOPIC
Bilirubin Urine: NEGATIVE
Glucose, UA: NEGATIVE
Hgb urine dipstick: NEGATIVE
Ketones, ur: NEGATIVE
Leukocytes,Ua: NEGATIVE
Nitrite: NEGATIVE
Protein, ur: NEGATIVE
Specific Gravity, Urine: 1.015 (ref 1.001–1.03)
pH: 5.5 (ref 5.0–8.0)

## 2020-06-19 LAB — VITAMIN D 25 HYDROXY (VIT D DEFICIENCY, FRACTURES): Vit D, 25-Hydroxy: 74 ng/mL (ref 30–100)

## 2020-06-19 LAB — INSULIN, RANDOM: Insulin: 7.2 u[IU]/mL

## 2020-06-19 LAB — HEMOGLOBIN A1C
Hgb A1c MFr Bld: 5.1 % of total Hgb (ref <5.7)
Mean Plasma Glucose: 100 (calc)
eAG (mmol/L): 5.5 (calc)

## 2020-06-19 LAB — TSH: TSH: 0.91 mIU/L (ref 0.40–4.50)

## 2020-06-19 LAB — MAGNESIUM: Magnesium: 2.3 mg/dL (ref 1.5–2.5)

## 2020-07-09 ENCOUNTER — Ambulatory Visit
Admission: RE | Admit: 2020-07-09 | Discharge: 2020-07-09 | Disposition: A | Discharge status: Home / Self Care | Payer: Medicare Other | Source: Ambulatory Visit | Attending: Internal Medicine | Admitting: Internal Medicine

## 2020-07-09 ENCOUNTER — Other Ambulatory Visit: Payer: Self-pay

## 2020-07-09 DIAGNOSIS — Z1231 Encounter for screening mammogram for malignant neoplasm of breast: Secondary | ICD-10-CM

## 2020-07-10 ENCOUNTER — Ambulatory Visit
Admission: RE | Admit: 2020-07-10 | Discharge: 2020-07-10 | Disposition: A | Discharge status: Home / Self Care | Payer: Medicare Other | Source: Ambulatory Visit | Attending: Internal Medicine | Admitting: Internal Medicine

## 2020-07-10 DIAGNOSIS — Z122 Encounter for screening for malignant neoplasm of respiratory organs: Secondary | ICD-10-CM

## 2020-07-10 DIAGNOSIS — F17210 Nicotine dependence, cigarettes, uncomplicated: Secondary | ICD-10-CM | POA: Diagnosis not present

## 2020-07-10 DIAGNOSIS — F17200 Nicotine dependence, unspecified, uncomplicated: Secondary | ICD-10-CM

## 2020-07-10 DIAGNOSIS — Z87891 Personal history of nicotine dependence: Secondary | ICD-10-CM

## 2020-07-10 NOTE — Progress Notes (Signed)
==========================================================  -   Chest CT scan - OK - No sign of Lung cancer   - Radiologist recommended 1 year annual follow-up ==========================================================

## 2020-08-29 ENCOUNTER — Other Ambulatory Visit: Payer: Self-pay | Admitting: Internal Medicine

## 2020-08-29 DIAGNOSIS — M810 Age-related osteoporosis without current pathological fracture: Secondary | ICD-10-CM

## 2020-09-17 DIAGNOSIS — M25521 Pain in right elbow: Secondary | ICD-10-CM | POA: Diagnosis not present

## 2020-09-21 ENCOUNTER — Ambulatory Visit: Payer: Medicare Other | Admitting: Adult Health

## 2020-11-08 ENCOUNTER — Other Ambulatory Visit: Payer: Self-pay | Admitting: Internal Medicine

## 2020-11-08 DIAGNOSIS — E782 Mixed hyperlipidemia: Secondary | ICD-10-CM

## 2020-11-13 ENCOUNTER — Other Ambulatory Visit: Payer: Self-pay | Admitting: Gastroenterology

## 2020-11-25 ENCOUNTER — Other Ambulatory Visit: Payer: Self-pay | Admitting: Internal Medicine

## 2020-11-25 DIAGNOSIS — M810 Age-related osteoporosis without current pathological fracture: Secondary | ICD-10-CM

## 2020-12-14 ENCOUNTER — Other Ambulatory Visit: Payer: Self-pay

## 2020-12-14 ENCOUNTER — Emergency Department (HOSPITAL_COMMUNITY): Payer: Medicare Other

## 2020-12-14 ENCOUNTER — Emergency Department (HOSPITAL_COMMUNITY)
Admission: EM | Admit: 2020-12-14 | Discharge: 2020-12-14 | Disposition: A | Discharge status: Home / Self Care | Payer: Medicare Other | Attending: Emergency Medicine | Admitting: Emergency Medicine

## 2020-12-14 DIAGNOSIS — R079 Chest pain, unspecified: Secondary | ICD-10-CM | POA: Diagnosis not present

## 2020-12-14 DIAGNOSIS — M79602 Pain in left arm: Secondary | ICD-10-CM | POA: Diagnosis not present

## 2020-12-14 DIAGNOSIS — R072 Precordial pain: Secondary | ICD-10-CM | POA: Insufficient documentation

## 2020-12-14 DIAGNOSIS — I10 Essential (primary) hypertension: Secondary | ICD-10-CM | POA: Diagnosis not present

## 2020-12-14 DIAGNOSIS — Z7982 Long term (current) use of aspirin: Secondary | ICD-10-CM | POA: Insufficient documentation

## 2020-12-14 DIAGNOSIS — F17210 Nicotine dependence, cigarettes, uncomplicated: Secondary | ICD-10-CM | POA: Diagnosis not present

## 2020-12-14 LAB — CBC
HCT: 44.4 % (ref 36.0–46.0)
Hemoglobin: 13.9 g/dL (ref 12.0–15.0)
MCH: 30.4 pg (ref 26.0–34.0)
MCHC: 31.3 g/dL (ref 30.0–36.0)
MCV: 97.2 fL (ref 80.0–100.0)
Platelets: 294 10*3/uL (ref 150–400)
RBC: 4.57 MIL/uL (ref 3.87–5.11)
RDW: 13.2 % (ref 11.5–15.5)
WBC: 10 10*3/uL (ref 4.0–10.5)
nRBC: 0 % (ref 0.0–0.2)

## 2020-12-14 LAB — TROPONIN I (HIGH SENSITIVITY)
Troponin I (High Sensitivity): 3 ng/L (ref <18)
Troponin I (High Sensitivity): 4 ng/L (ref <18)

## 2020-12-14 LAB — BASIC METABOLIC PANEL
Anion gap: 12 (ref 5–15)
BUN: 17 mg/dL (ref 8–23)
CO2: 24 mmol/L (ref 22–32)
Calcium: 9.5 mg/dL (ref 8.9–10.3)
Chloride: 104 mmol/L (ref 98–111)
Creatinine, Ser: 1.11 mg/dL — ABNORMAL HIGH (ref 0.44–1.00)
GFR, Estimated: 52 mL/min — ABNORMAL LOW (ref >60)
Glucose, Bld: 84 mg/dL (ref 70–99)
Potassium: 4.1 mmol/L (ref 3.5–5.1)
Sodium: 140 mmol/L (ref 135–145)

## 2020-12-14 NOTE — ED Provider Notes (Signed)
[facility] EMERGENCY DEPARTMENT Provider Note   CSN: [id number] Arrival date & time: 12/14/20  2426     History Chief Complaint  Patient presents with   Chest Pain    Alison Campbell is a 74 y.o. female.  The history is provided by the patient and medical records.  Chest Pain  Alison Campbell is a 74 y.o. female who presents to the Emergency Department complaining of arm pain. She presents to the ED complaining of left arm pain that lasted about one minute this morning around 8am.  Pain was sharp in nature. Pain is now completely resolved.  Yesterday she had some central chest pain, non radiating.  Pain lasted all day and felt like she had hit something.  Today she has no chest pain.    Denies fevers, cough, sob, abdominal pain, N/V, leg swelling, weakness.    She has a hx/o HPL.  No prior similar sxs.  Lives with family.    Smokes tobacco.  Denies alcohol or drug use.    Has been vaccinated for covid 19.    No hx/o DVT/PE.  No recent surgery or accidents.      Past Medical History:  Diagnosis Date   Anxiety    Colon polyps    Costochondritis    GERD (gastroesophageal reflux disease)    coffee related   Hyperlipidemia    Insomnia    Osteoporosis    Pre-diabetes     Patient Active Problem List   Diagnosis Date Noted   Insomnia    Dyshidrotic foot dermatitis    Aortic atherosclerosis ([place]) 03/11/2018   Tobacco use disorder 06/21/2015   Osteoporosis 06/21/2015   Screening for colorectal cancer 02/02/2015   Essential hypertension 11/07/2013   Hyperlipidemia, mixed 11/07/2013   Abnormal glucose 11/07/2013   Chronic bronchitis ([place]) 11/07/2013   Vitamin D deficiency 11/07/2013   Diverticulosis of large intestine 09/10/2011   Colon polyp 09/10/2011    Past Surgical History:  Procedure Laterality Date   ABDOMINAL HYSTERECTOMY     COLONOSCOPY  09/10/2011   hysterectomy     POLYPECTOMY     UVULECTOMY N/A  09/09/2016   Procedure: PARTIAL UVULECTOMY, direct laryngoscopy;  Surgeon: Rozetta Nunnery, MD;  Location: [facility];  Service: ENT;  Laterality: N/A;  PARTIAL UVULECTOMY, direct laryngoscopy     OB History   No obstetric history on file.     Family History  Problem Relation Age of Onset   Heart disease Mother    Heart attack Mother    Heart disease Father    Diabetes Father    Heart attack Father    Cancer Brother        Lung   Bipolar disorder Daughter    Colon cancer Neg Hx    Esophageal cancer Neg Hx    Stomach cancer Neg Hx    Rectal cancer Neg Hx     Social History   Tobacco Use   Smoking status: Current Every Day Smoker    Packs/day: 0.50    Years: 50.00    Pack years: 25.00    Types: Cigarettes   Smokeless tobacco: Never Used  Vaping Use   Vaping Use: Never used  Substance Use Topics   Alcohol use: No    Alcohol/week: 0.0 standard drinks   Drug use: No    Home Medications Prior to Admission medications   Medication Sig Start Date End Date Taking? Authorizing Provider  acetaminophen (TYLENOL) 500 MG  tablet Take 1 tablet (500 mg total) by mouth every 6 (six) hours as needed. 01/17/20   Chase Picket, MD  alendronate (FOSAMAX) 70 MG tablet Take      1 tablet      every 7 days       with a full glass of water       on a empty stomach     For an hour 11/25/20   [name], MD  ALPRAZolam Duanne Moron) 1 MG tablet 1/2 to 1 tablet 2 to 3 x daily as needed for anxiety or sleep 05/19/16   Rolene Course, PA-C  aspirin 81 MG EC tablet Take 1 tablet daily 08/30/15   [name], MD  CALCIUM PO Take 2 tablets by mouth daily. 600 mg  2 per day    [provider]  Cholecalciferol (VITAMIN D PO) Take 2,000 Units by mouth daily. Take 5 caps to = 10,000 units daily.    [provider]  Cyanocobalamin (VITAMIN B 12 PO) Take 1 tablet by mouth daily.    [provider]  ezetimibe (ZETIA) 10 MG tablet TAKE 1  TABLET BY MOUTH DAILY FOR CHOLESTEROL 11/08/20   [name], NP  ibuprofen (ADVIL) 400 MG tablet Take 1 tablet (400 mg total) by mouth every 6 (six) hours as needed. 01/17/20   Chase Picket, MD  Multiple Vitamin (MULTIVITAMIN PO) Take 1 tablet by mouth daily.     [provider]  Psyllium (METAMUCIL FIBER PO) Take by mouth daily.    [provider]    Allergies    Fish oil  Review of Systems   Review of Systems  Cardiovascular: Positive for chest pain.  All other systems reviewed and are negative.   Physical Exam Updated Vital Signs BP (!) 179/81    Pulse 76    Temp 97.9 F (36.6 C) (Oral)    Resp 14    Ht 5\' 3"  (1.6 m)    Wt 65.3 kg    SpO2 98%    BMI 25.51 kg/m   Physical Exam Vitals and nursing note reviewed.  Constitutional:      Appearance: She is well-developed and well-nourished.  HENT:     Head: Normocephalic and atraumatic.  Cardiovascular:     Rate and Rhythm: Normal rate and regular rhythm.     Heart sounds: No murmur heard.   Pulmonary:     Effort: Pulmonary effort is normal. No respiratory distress.     Breath sounds: Normal breath sounds.  Abdominal:     Palpations: Abdomen is soft.     Tenderness: There is no abdominal tenderness. There is no guarding or rebound.  Musculoskeletal:        General: No tenderness or edema.     Comments: 2+ radial and DP pulses bilaterally  Skin:    General: Skin is warm and dry.  Neurological:     Mental Status: She is alert and oriented to person, place, and time.     Comments: 5/5 strength in all four extremities with sensation to light touch intact in all four extremities.    Psychiatric:        Mood and Affect: Mood and affect normal.        Behavior: Behavior normal.     ED Results / Procedures / Treatments   Labs (all labs ordered are listed, but only abnormal results are displayed) Labs Reviewed  BASIC METABOLIC PANEL - Abnormal; Notable for the following components:  Result  Value   Creatinine, Ser 1.11 (*)    GFR, Estimated 52 (*)    All other components within normal limits  CBC  TROPONIN I (HIGH SENSITIVITY)  TROPONIN I (HIGH SENSITIVITY)    EKG EKG Interpretation  Date/Time:  Friday December 14 2020 09:47:49 EST Ventricular Rate:  76 PR Interval:  134 QRS Duration: 82 QT Interval:  386 QTC Calculation: 434 R Axis:   44 Text Interpretation: Normal sinus rhythm Normal ECG No previous tracing Confirmed by Quintella Reichert [phone]) on 12/14/2020 4:50:02 PM   Radiology DG Chest 2 View  Result Date: 12/14/2020 CLINICAL DATA:  Onset chest pain yesterday and left arm pain today. EXAM: CHEST - 2 VIEW COMPARISON:  Single-view of the chest 01/17/2020. CT chest 07/10/2020. FINDINGS: Lungs clear. Heart size normal. Aortic atherosclerosis. No pneumothorax or pleural fluid. No acute or focal bony abnormality. IMPRESSION: No acute disease. Aortic Atherosclerosis (ICD10-I70.0). Electronically Signed   By: Inge Rise M.D.   On: 12/14/2020 17:48    Procedures Procedures (including critical care time)  Medications Ordered in ED Medications - No data to display  ED Course  I have reviewed the triage vital signs and the nursing notes.  Pertinent labs & imaging results that were available during my care of the patient were reviewed by me and considered in my medical decision making (see chart for details).    MDM Rules/Calculators/A&P                         patient here for evaluation of episode of chest pain yesterday as well as brief episode of arm pain today. She is non-toxic appearing on evaluation. She has symmetric pulses in all four extremities. EKG is without acute ischemic changes in troponin is negative times two. She has been pain free throughout her ED stay. She was hypertensive on ED presentation, blood pressure improved without any intervention. Presentation is not consistent with ACS, PE, dissection. Feel patient is stable for discharge home  with outpatient cardiology follow-up. Return precautions discussed.  Findings of studies discussed with patient and her daughter.  Final Clinical Impression(s) / ED Diagnoses Final diagnoses:  Precordial pain    Rx / DC Orders ED Discharge Orders         Ordered    Ambulatory referral to Cardiology        12/14/20 1820           Quintella Reichert, MD 12/14/20 [facility]

## 2020-12-14 NOTE — ED Triage Notes (Signed)
Pt bib ems with reports of central CP yesterday and today developed L arm pain and heaviness. Denies current chest pain. 324mg  asa given pta with resolution of pain. 12 lead unremarkable with EMS. VSS with EMS.

## 2020-12-18 ENCOUNTER — Telehealth: Payer: Self-pay | Admitting: Internal Medicine

## 2020-12-18 NOTE — Telephone Encounter (Signed)
That is fine with me.  Thanks.  Nelva Bush, MD [facility]

## 2020-12-18 NOTE — Telephone Encounter (Signed)
New Message  Pt would like to switch provider from Dr. Saunders Revel to Dr. Marlou Porch, due to location, she is closer to go to [address] office.  Please advice

## 2020-12-24 ENCOUNTER — Encounter: Payer: Self-pay | Admitting: Adult Health

## 2020-12-24 DIAGNOSIS — N183 Chronic kidney disease, stage 3 unspecified: Secondary | ICD-10-CM

## 2020-12-24 DIAGNOSIS — Z6825 Body mass index (BMI) 25.0-25.9, adult: Secondary | ICD-10-CM | POA: Insufficient documentation

## 2020-12-24 HISTORY — DX: Chronic kidney disease, stage 3 unspecified: N18.30

## 2020-12-24 NOTE — Progress Notes (Deleted)
FOLLOW UP  Assessment and Plan:   Hypertension Well controlled off of medications at this time Monitor blood pressure at home; patient to call if consistently greater than 130/80 Continue DASH diet.   Reminder to go to the ER if any CP, SOB, nausea, dizziness, severe HA, changes vision/speech, left arm numbness and tingling and jaw pain.  Cholesterol Currently above goal; she is newly on zetia; has tolerated statins in the past but very resistant to these agents; would consider low dose/low frequency Continue low cholesterol diet and exercise.  Check lipid panel.   Other abnormal glucose Recent A1Cs at goal Discussed diet/exercise, weight management  Defer A1C; check CMP  BMI 25 Continue to recommend diet heavy in fruits and veggies and low in animal meats, cheeses, and dairy products, appropriate calorie intake Discuss exercise recommendations routinely Continue to monitor weight at each visit  Vitamin D Def At goal at last visit; continue supplementation to maintain goal of 70-100 Defer Vit D level  Tobacco use Discussed risks associated with tobacco use and advised to reduce or quit Patient is not ready to do so, but advised to consider strongly Will follow up at the next visit She is undergoing annual low dose CT  Dyshidrosis Chronic, 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 Further disposition pending results of labs. Discussed med's effects and SE's.   Over 30 minutes of exam, counseling, chart review, and critical decision making was performed.   Future Appointments  Date Time Provider Department Center  12/25/2020 11:00 AM Judd Gaudier, NP [facility] None  06/19/2021 11:00 AM Lucky Cowboy, MD [facility]  None    ----------------------------------------------------------------------------------------------------------------------  HPI 74 y.o. female  presents for 3 month follow up on hypertension, cholesterol, glucose management, weight and vitamin D deficiency.   Patient has COPD consequent of a 50+ yr smoking hx. She has had negative screening Chest CT's . Unfortunately, she continues to smoke. We discussed smoking cessation techniques/options but she is not ready to quit at this time.   She is prescribed xanax PRN for sleep, only takes 1/2 tab 2-3 days a week if struggling to sleep. Has not tried benadryl but willing to do so.   BMI is There is no height or weight on file to calculate BMI., she has been working on diet, admits exercise is limited, but active around house and yard.  Wt Readings from Last 3 Encounters:  12/14/20 144 lb (65.3 kg)  06/18/20 150 lb (68 kg)  01/09/20 144 lb (65.3 kg)   She had a negative stress Myoview (EF 65%) in Mar 2019.  Her blood pressure has been controlled at home, today their BP is    She does not workout. She denies chest pain, shortness of breath, dizziness.   She is on cholesterol medication Zetia and denies myalgias (was on pravastatin, atorvastatin but stopped due to concerns with statin reputation, can't recall andy SE).  Her cholesterol is not at goal. The cholesterol last visit was:   Lab Results  Component Value Date   CHOL 205 (H) 06/18/2020   HDL 41 (L) 06/18/2020   LDLCALC 111 (H) 06/18/2020   TRIG 373 (H) 06/18/2020   CHOLHDL 5.0 (H) 06/18/2020    She has been working on diet and exercise for hx  of prediabetes, and denies increased appetite, nausea, paresthesia of the feet, polydipsia, polyuria and visual disturbances. Last A1C in the office was:  Lab Results  Component Value Date   HGBA1C 5.1 06/18/2020     Lab Results  Component Value Date   GFRNONAA 52 (L) 12/14/2020   GFRNONAA 48 (L) 06/18/2020   GFRNONAA 54 (L)  08/18/2019    Patient is on Vitamin D supplement.   Lab Results  Component Value Date   VD25OH 74 06/18/2020        Current Medications:  Current Outpatient Medications on File Prior to Visit  Medication Sig  . acetaminophen (TYLENOL) 500 MG tablet Take 1 tablet (500 mg total) by mouth every 6 (six) hours as needed.  Marland Kitchen alendronate (FOSAMAX) 70 MG tablet Take      1 tablet      every 7 days       with a full glass of water       on a empty stomach     For an hour  . ALPRAZolam (XANAX) 1 MG tablet 1/2 to 1 tablet 2 to 3 x daily as needed for anxiety or sleep  . aspirin 81 MG EC tablet Take 1 tablet daily  . CALCIUM PO Take 2 tablets by mouth daily. 600 mg  2 per day  . Cholecalciferol (VITAMIN D PO) Take 2,000 Units by mouth daily. Take 5 caps to = 10,000 units daily.  . Cyanocobalamin (VITAMIN B 12 PO) Take 1 tablet by mouth daily.  Marland Kitchen ezetimibe (ZETIA) 10 MG tablet TAKE 1 TABLET BY MOUTH DAILY FOR CHOLESTEROL  . ibuprofen (ADVIL) 400 MG tablet Take 1 tablet (400 mg total) by mouth every 6 (six) hours as needed.  . Multiple Vitamin (MULTIVITAMIN PO) Take 1 tablet by mouth daily.   . Psyllium (METAMUCIL FIBER PO) Take by mouth daily.   No current facility-administered medications on file prior to visit.     Allergies:  Allergies  Allergen Reactions  . Fish Oil Diarrhea     Medical History:  Past Medical History:  Diagnosis Date  . Anxiety   . Colon polyps   . Costochondritis   . GERD (gastroesophageal reflux disease)    coffee related  . Hyperlipidemia   . Insomnia   . Osteoporosis   . Pre-diabetes    Family history- Reviewed and unchanged Social history- Reviewed and unchanged   Review of Systems:  Review of Systems  Constitutional: Negative for malaise/fatigue and weight loss.  HENT: Negative for hearing loss and tinnitus.   Eyes: Negative for blurred vision and double vision.  Respiratory: Negative for cough, shortness of breath and wheezing.    Cardiovascular: Negative for chest pain, palpitations, orthopnea, claudication and leg swelling.  Gastrointestinal: Negative for abdominal pain, blood in stool, constipation, diarrhea, heartburn, melena, nausea and vomiting.  Genitourinary: Negative.   Musculoskeletal: Negative for joint pain and myalgias.  Skin: Positive for rash (chronic to bil soles).  Neurological: Negative for dizziness, tingling, sensory change, weakness and headaches.  Endo/Heme/Allergies: Negative for polydipsia.  Psychiatric/Behavioral: Negative.   All other systems reviewed and are negative.    Physical Exam: There were no vitals taken for this visit. Wt Readings from Last 3 Encounters:  12/14/20 144 lb (65.3 kg)  06/18/20 150 lb (68 kg)  01/09/20 144 lb (65.3 kg)   General Appearance: Well nourished, in no apparent distress. Eyes: PERRLA, EOMs, conjunctiva no swelling or erythema Sinuses: No Frontal/maxillary tenderness ENT/Mouth: Ext aud canals clear, TMs  without erythema, bulging. No erythema, swelling, or exudate on post pharynx.  Tonsils not swollen or erythematous. Hearing normal.  Neck: Supple, thyroid normal.  Respiratory: Respiratory effort normal, BS equal bilaterally without rales, rhonchi, wheezing or stridor.  Cardio: RRR with no MRGs. Brisk peripheral pulses without edema.  Abdomen: Soft, + BS.  Non tender, no guarding, rebound, hernias, masses. Lymphatics: Non tender without lymphadenopathy.  Musculoskeletal: Full ROM, 5/5 strength, Normal gait Skin: Warm, dry without lesions, ecchymosis. She has rash to sole of R foot, similar but less to L sole; erythematous with pustules Neuro: Cranial nerves intact. No cerebellar symptoms.  Psych: Awake and oriented X 3, normal affect, Insight and Judgment appropriate.    Izora Ribas, NP 8:45 AM [facility]

## 2020-12-24 NOTE — Telephone Encounter (Signed)
Fine with me. Yanitza Shvartsman, MD  

## 2020-12-25 ENCOUNTER — Ambulatory Visit: Payer: Medicare Other | Admitting: Adult Health

## 2021-01-03 ENCOUNTER — Other Ambulatory Visit: Payer: Self-pay

## 2021-01-03 ENCOUNTER — Ambulatory Visit (INDEPENDENT_AMBULATORY_CARE_PROVIDER_SITE_OTHER): Payer: Medicare Other | Admitting: Cardiology

## 2021-01-03 ENCOUNTER — Encounter: Payer: Self-pay | Admitting: Cardiology

## 2021-01-03 VITALS — BP 120/80 | HR 87 | Ht 63.5 in | Wt 145.4 lb

## 2021-01-03 DIAGNOSIS — Z79899 Other long term (current) drug therapy: Secondary | ICD-10-CM

## 2021-01-03 DIAGNOSIS — I2510 Atherosclerotic heart disease of native coronary artery without angina pectoris: Secondary | ICD-10-CM | POA: Diagnosis not present

## 2021-01-03 DIAGNOSIS — I700 Atherosclerosis of aorta: Secondary | ICD-10-CM

## 2021-01-03 DIAGNOSIS — E782 Mixed hyperlipidemia: Secondary | ICD-10-CM | POA: Diagnosis not present

## 2021-01-03 MED ORDER — ROSUVASTATIN CALCIUM 20 MG PO TABS: 20.0000 mg | ORAL_TABLET | Freq: Every day | ORAL | 3 refills | Status: DC

## 2021-01-03 NOTE — Progress Notes (Signed)
Cardiology Office Note:    Date:  01/03/2021   ID:  Nil, Bolser 11/08/1946, MRN [id number]  PCP:  Lucky Cowboy, MD  [facility] Cardiologist:  No primary care provider on file.  [facility] Electrophysiologist:  None   Referring MD: Lucky Cowboy, MD     History of Present Illness:    Alison Campbell is a 75 y.o. female here for the evaluation of chest pain.  Previously seen by [name] in 2019.  Has hypertension hyperlipidemia prediabetes COPD and tobacco history.  Previously described chest and left arm pain which seem to be 2 distinct entities.  Chest pain seem to resolve after stopping a statin in the past.  Left arm pain developed after she was seated for extended periods.  EKG was normal.  Prior CT of chest in 2018 for lung cancer screening showed aortic atherosclerosis as well as coronary artery disease/calcification.  Statin therapy previously was recommended.  She had another episode of chest discomfort that landed her in the emergency department on 12/14/2020.  Had left arm pain lasting about a minutes duration. Was sitting at computer. Duaghter is Engineer, civil (consulting).  It was sharp.  Prior to that she was having central chest pain the day before nonradiating.  Seem to last all day like she had hit something.  Continues to smoke.  Notes reviewed.  EKG done was nonischemic and his rhythm heart rate 76 bpm personally reviewed and interpreted..  Troponin was negative.  Crestor in the past takes oPRN  Father died with MI at 46. Died before 3th birthday, became an orphan. Mother died in her 15's.   GM was Alison Campbell cousin.   Her daughter is a Engineer, civil (consulting), Production designer, theatre/television/film with palliative care agency.  She has her title, lady, from [place].  Past Medical History:  Diagnosis Date  . Anxiety   . CKD (chronic kidney disease) stage 3, GFR 30-59 ml/min (HCC) 12/24/2020  . Colon polyps   . Costochondritis   . GERD (gastroesophageal reflux disease)    coffee related  .  Hyperlipidemia   . Insomnia   . Osteoporosis   . Pre-diabetes     Past Surgical History:  Procedure Laterality Date  . ABDOMINAL HYSTERECTOMY    . COLONOSCOPY  09/10/2011  . hysterectomy    . POLYPECTOMY    . UVULECTOMY N/A 09/09/2016   Procedure: PARTIAL UVULECTOMY, direct laryngoscopy;  Surgeon: Alison Halon, MD;  Location: [facility];  Service: ENT;  Laterality: N/A;  PARTIAL UVULECTOMY, direct laryngoscopy    Current Medications: Current Meds  Medication Sig  . alendronate (FOSAMAX) 70 MG tablet Take      1 tablet      every 7 days       with a full glass of water       on a empty stomach     For an hour  . ALPRAZolam (XANAX) 1 MG tablet 1/2 to 1 tablet 2 to 3 x daily as needed for anxiety or sleep  . aspirin 81 MG EC tablet Take 1 tablet daily  . CALCIUM PO Take 2 tablets by mouth daily. 600 mg  2 per day  . chlorhexidine (PERIDEX) 0.12 % solution SMARTSIG:By Mouth  . Cholecalciferol (VITAMIN D PO) Take 2,000 Units by mouth daily. Take 5 caps to = 10,000 units daily.  . Cyanocobalamin (VITAMIN B 12 PO) Take 1 tablet by mouth daily.  Marland Kitchen ezetimibe (ZETIA) 10 MG tablet TAKE 1 TABLET BY MOUTH DAILY FOR  CHOLESTEROL  . Multiple Vitamin (MULTIVITAMIN PO) Take 1 tablet by mouth daily.  . Psyllium (METAMUCIL FIBER PO) Take by mouth daily.  . rosuvastatin (CRESTOR) 20 MG tablet Take 1 tablet (20 mg total) by mouth daily.     Allergies:   Fish oil   Social History   Socioeconomic History  . Marital status: Widowed    Spouse name: Not on file  . Number of children: Not on file  . Years of education: Not on file  . Highest education level: Not on file  Occupational History  . Not on file  Tobacco Use  . Smoking status: Current Every Day Smoker    Packs/day: 0.50    Years: 50.00    Pack years: 25.00    Types: Cigarettes  . Smokeless tobacco: Never Used  Vaping Use  . Vaping Use: Never used  Substance and Sexual Activity  . Alcohol use: No     Alcohol/week: 0.0 standard drinks  . Drug use: No  . Sexual activity: Not on file  Other Topics Concern  . Not on file  Social History Narrative  . Not on file   Social Determinants of Health   Financial Resource Strain: Not on file  Food Insecurity: Not on file  Transportation Needs: Not on file  Physical Activity: Not on file  Stress: Not on file  Social Connections: Not on file     Family History: The patient's family history includes Bipolar disorder in her daughter; Cancer in her brother; Diabetes in her father; Heart attack in her father and mother; Heart disease in her father and mother. There is no history of Colon cancer, Esophageal cancer, Stomach cancer, or Rectal cancer.  ROS:   Please see the history of present illness.     All other systems reviewed and are negative.  EKGs/Labs/Other Studies Reviewed:     Recent Labs: 06/18/2020: ALT 20; Magnesium 2.3; TSH 0.91 12/14/2020: BUN 17; Creatinine, Ser 1.11; Hemoglobin 13.9; Platelets 294; Potassium 4.1; Sodium 140  Recent Lipid Panel    Component Value Date/Time   CHOL 205 (H) 06/18/2020 0959   TRIG 373 (H) 06/18/2020 0959   HDL 41 (L) 06/18/2020 0959   CHOLHDL 5.0 (H) 06/18/2020 0959   VLDL 44 (H) 03/23/2017 1053   LDLCALC 111 (H) 06/18/2020 0959     Risk Assessment/Calculations:       Physical Exam:    VS:  BP 120/80   Pulse 87   Ht 5' 3.5" (1.613 m)   Wt 145 lb 6.4 oz (66 kg)   SpO2 95%   BMI 25.35 kg/m     Wt Readings from Last 3 Encounters:  01/03/21 145 lb 6.4 oz (66 kg)  12/14/20 144 lb (65.3 kg)  06/18/20 150 lb (68 kg)     GEN:  Well nourished, well developed in no acute distress HEENT: Normal NECK: No JVD; No carotid bruits LYMPHATICS: No lymphadenopathy CARDIAC: RRR, no murmurs, rubs, gallops RESPIRATORY:  Clear to auscultation without rales, wheezing or rhonchi  ABDOMEN: Soft, non-tender, non-distended MUSCULOSKELETAL:  No edema; No deformity  SKIN: Warm and dry NEUROLOGIC:   Alert and oriented x 3 PSYCHIATRIC:  Normal affect   ASSESSMENT:    1. Aortic atherosclerosis ([place])   2. Medication management   3. Hyperlipidemia, mixed   4. Coronary artery disease involving native coronary artery of native heart without angina pectoris    PLAN:    In order of problems listed above:  Atypical chest pain - 2019  nuclear stress test was low risk - Prior CT of chest for lung cancer screening did show evidence of coronary artery disease/coronary artery calcification as well as aortic atherosclerosis. - Showed a picture of the CT scan.  As above stress test lower risk. - Starting statin. - If pain returns or becomes more worrisome, we can always consider further cardiac testing such as coronary CT with FFR analysis or potentially cardiac catheterization given her extensive coronary calcification.  Hyperlipidemia - Previous LDL in the 90s.  Has been encouraged in the past to increase statin therapy.  1 point was taking atorvastatin 40 mg twice a week. - Last LDL 111. - Given her coronary atherosclerosis as well as aortic atherosclerosis and strong family history as well as smoking history, we will go ahead and start her on Crestor 20 mg a day.  She is amenable to this.  I am fine with her also taking or continuing the Zetia 10 mg. - In 3 months we will check her lipid panel and ALT.  See her back in 3 months.     Medication Adjustments/Labs and Tests Ordered: Current medicines are reviewed at length with the patient today.  Concerns regarding medicines are outlined above.  Orders Placed This Encounter  Procedures  . ALT  . Lipid panel   Meds ordered this encounter  Medications  . rosuvastatin (CRESTOR) 20 MG tablet    Sig: Take 1 tablet (20 mg total) by mouth daily.    Dispense:  90 tablet    Refill:  3    Patient Instructions  Medication Instructions:  Please start Crestor 20 mg a day. Continue all other medications as listed.  *If you need a refill on  your cardiac medications before your next appointment, please call your pharmacy*  Lab Work: Please have blood work in 3 month (Lipid/ALT)  If you have labs (blood work) drawn today and your tests are completely normal, you will receive your results only by: Marland Kitchen MyChart Message (if you have MyChart) OR . A paper copy in the mail If you have any lab test that is abnormal or we need to change your treatment, we will call you to review the results.  Follow-Up: At [facility], you and your health needs are our priority.  As part of our continuing mission to provide you with exceptional heart care, we have created designated Provider Care Teams.  These Care Teams include your primary Cardiologist (physician) and Advanced Practice Providers (APPs -  Physician Assistants and Nurse Practitioners) who all work together to provide you with the care you need, when you need it.  We recommend signing up for the patient portal called "MyChart".  Sign up information is provided on this After Visit Summary.  MyChart is used to connect with patients for Virtual Visits (Telemedicine).  Patients are able to view lab/test results, encounter notes, upcoming appointments, etc.  Non-urgent messages can be sent to your provider as well.   To learn more about what you can do with MyChart, go to [email or web address].    Your next appointment:   3 month(s)  The format for your next appointment:   In Person  Provider:   Candee Furbish, MD   Thank you for choosing [facility]!!         Signed, Candee Furbish, MD  01/03/2021 10:59 AM    [place]

## 2021-01-03 NOTE — Patient Instructions (Signed)
Medication Instructions:  Please start Crestor 20 mg a day. Continue all other medications as listed.  *If you need a refill on your cardiac medications before your next appointment, please call your pharmacy*  Lab Work: Please have blood work in 3 month (Lipid/ALT)  If you have labs (blood work) drawn today and your tests are completely normal, you will receive your results only by: Marland Kitchen MyChart Message (if you have MyChart) OR . A paper copy in the mail If you have any lab test that is abnormal or we need to change your treatment, we will call you to review the results.  Follow-Up: At [facility], you and your health needs are our priority.  As part of our continuing mission to provide you with exceptional heart care, we have created designated Provider Care Teams.  These Care Teams include your primary Cardiologist (physician) and Advanced Practice Providers (APPs -  Physician Assistants and Nurse Practitioners) who all work together to provide you with the care you need, when you need it.  We recommend signing up for the patient portal called "MyChart".  Sign up information is provided on this After Visit Summary.  MyChart is used to connect with patients for Virtual Visits (Telemedicine).  Patients are able to view lab/test results, encounter notes, upcoming appointments, etc.  Non-urgent messages can be sent to your provider as well.   To learn more about what you can do with MyChart, go to [email or web address].    Your next appointment:   3 month(s)  The format for your next appointment:   In Person  Provider:   Donato Schultz, MD   Thank you for choosing [facility]!!

## 2021-02-23 ENCOUNTER — Other Ambulatory Visit: Payer: Self-pay | Admitting: Internal Medicine

## 2021-02-23 DIAGNOSIS — M810 Age-related osteoporosis without current pathological fracture: Secondary | ICD-10-CM

## 2021-04-03 ENCOUNTER — Other Ambulatory Visit: Payer: Medicare Other

## 2021-04-08 ENCOUNTER — Ambulatory Visit: Payer: Medicare Other | Admitting: Cardiology

## 2021-06-19 ENCOUNTER — Encounter: Payer: Medicare Other | Admitting: Internal Medicine

## 2021-10-14 ENCOUNTER — Encounter: Payer: Self-pay | Admitting: Internal Medicine

## 2021-10-14 NOTE — Patient Instructions (Signed)

## 2021-10-14 NOTE — Progress Notes (Signed)
Annual Screening/Preventative Visit & Comprehensive Evaluation &  Examination  Future Appointments  Date Time Provider [place]  10/15/2021  2:00 PM [name], MD [facility] None  10/15/2022  2:00 PM [name], MD [facility] None         This very nice 75 y.o. [facility] presents for a Screening /Preventative Visit & comprehensive evaluation and management of multiple medical co-morbidities.  Patient has been followed for HTN, HLD, Prediabetes  and Vitamin D Deficiency. Chest CT scan in July 2021  showed Aortic Atherosclerosis & Coronary Aa calcifications.                                                      Patient has a 50+ yr smoking hx and COPD  Last year, she had negative LD screening Chest CT. Unfortunately, she continues to smoke. She is agreeable to undergo another screening low dose CT scan of the chest.  We discussed smoking cessation techniques/options        Patient has been followed for years for labile HTN. In 2019, she had a negative Stress Myoview.   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 Patient's BP has been controlled at home and patient denies any cardiac symptoms as chest pain, palpitations, shortness of breath, dizziness or ankle swelling. Today's BP: 128/80        Patient' alleges Statin Intolerance and her hyperlipidemia is not controlled with diet and Ezetimibe.  In Jan 2022,  Dr Marlou Porch added  Rosuvastatin.  Last lipids in 2021 were not at goal:  Lab Results  Component Value Date   CHOL 205 (H) 06/18/2020   HDL 41 (L) 06/18/2020   LDLCALC 111 (H) 06/18/2020   TRIG 373 (H) 06/18/2020   CHOLHDL 5.0 (H) 06/18/2020         Patient has hx/o prediabetes (A1c 6.0% /2011) and patient denies reactive hypoglycemic symptoms, visual blurring, diabetic polys or paresthesias. Last A1c was normal & at goal:  Lab Results  Component Value Date   HGBA1C 5.1 06/18/2020         Finally, patient has history of Vitamin D  Deficiency ("35" / 2008 and last Vitamin D was at goal:  Lab Results  Component Value Date   VD25OH 74 06/18/2020     Current Outpatient Medications on File Prior to Visit  Medication Sig   alendronate (FOSAMAX) 70 MG tablet TAKE 1 TABLET BY MOUTH EVERY 7 DAYS WITH A FULL GLASS OF WATER ON AN EMPTY STOMACH FOR AN HOUR   ALPRAZolam (XANAX) 1 MG tablet 1/2 to 1 tablet 2 to 3 x daily as needed for anxiety or sleep   aspirin 81 MG EC tablet Take 1 tablet daily   CALCIUM PO Take 2 tablets by mouth daily. 600 mg  2 per day   chlorhexidine (PERIDEX) 0.12 % solution SMARTSIG:By Mouth   Cholecalciferol (VITAMIN D PO) Take 2,000 Units by mouth daily. Take 5 caps to = 10,000 units daily.   Cyanocobalamin (VITAMIN B 12 PO) Take 1 tablet by mouth daily.   ezetimibe (ZETIA) 10 MG tablet TAKE 1 TABLET BY MOUTH DAILY FOR CHOLESTEROL   Multiple Vitamin (MULTIVITAMIN PO) Take 1 tablet by mouth daily.   Psyllium (METAMUCIL FIBER PO) Take by mouth daily.   rosuvastatin (CRESTOR) 20 MG tablet Take  1 tablet (20 mg total) by mouth daily.      Allergies  Allergen Reactions   Fish Oil Diarrhea     Past Medical History:  Diagnosis Date   Anxiety    CKD (chronic kidney disease) stage 3, GFR 30-59 ml/min (HCC) 12/24/2020   Colon polyps    Costochondritis    GERD (gastroesophageal reflux disease)    coffee related   Hyperlipidemia    Insomnia    Osteoporosis    Pre-diabetes      Health Maintenance  Topic Date Due   Hepatitis C Screening  Never done   Zoster Vaccines- Shingrix (1 of 2) Never done   TETANUS/TDAP  02/27/2019   COVID-19 Vaccine (3 - Pfizer risk series) 03/27/2020   INFLUENZA VACCINE  07/29/2021   DEXA SCAN  Completed   HPV VACCINES  Aged Out     Immunization History  Administered Date(s) Administered   Influenza, High Dose  10/25/2015, 11/24/2017, 11/29/2018   PFIZER SARS-COV-2 Vacc 02/02/2020, 02/28/2020   Pneumococcal -13 02/02/2015   Pneumococcal -23 03/29/2010    Tdap 02/26/2009   Zoster, Live 12/29/2008    Last Colon - 01/09/2020 - Dr Ardis Hughs recc 3 year f/u due Jan 2024.   Last MGM - 07/09/2020 - Overdue    Past Surgical History:  Procedure Laterality Date   ABDOMINAL HYSTERECTOMY     COLONOSCOPY  09/10/2011   hysterectomy     POLYPECTOMY     UVULECTOMY N/A 09/09/2016   Procedure: PARTIAL UVULECTOMY, direct laryngoscopy;  Surgeon: Rozetta Nunnery, MD;  Location: [place];  Service: ENT;  Laterality: N/A;  PARTIAL UVULECTOMY, direct laryngoscopy     Family History  Problem Relation Age of Onset   Heart disease Mother    Heart attack Mother    Heart disease Father    Diabetes Father    Heart attack Father    Cancer Brother        Lung   Bipolar disorder Daughter    Colon cancer Neg Hx    Esophageal cancer Neg Hx    Stomach cancer Neg Hx    Rectal cancer Neg Hx      Social History   Tobacco Use   Smoking status: Every Day    Packs/day: 0.50    Years: 50.00    Pack years: 25.00    Types: Cigarettes   Smokeless tobacco: Never  Vaping Use   Vaping Use: Never used  Substance Use Topics   Alcohol use: No    Alcohol/week: 0.0 standard drinks   Drug use: No      ROS Constitutional: Denies fever, chills, weight loss/gain, headaches, insomnia,  night sweats, and change in appetite. Does c/o fatigue. Eyes: Denies redness, blurred vision, diplopia, discharge, itchy, watery eyes.  ENT: Denies discharge, congestion, post nasal drip, epistaxis, sore throat, earache, hearing loss, dental pain, Tinnitus, Vertigo, Sinus pain, snoring.  Cardio: Denies chest pain, palpitations, irregular heartbeat, syncope, dyspnea, diaphoresis, orthopnea, PND, claudication, edema Respiratory: denies cough, dyspnea, DOE, pleurisy, hoarseness, laryngitis, wheezing.  Gastrointestinal: Denies dysphagia, heartburn, reflux, water brash, pain, cramps, nausea, vomiting, bloating, diarrhea, constipation, hematemesis, melena, hematochezia,  jaundice, hemorrhoids Genitourinary: Denies dysuria, frequency, urgency, nocturia, hesitancy, discharge, hematuria, flank pain Breast: Breast lumps, nipple discharge, bleeding.  Musculoskeletal: Denies arthralgia, myalgia, stiffness, Jt. Swelling, pain, limp, and strain/sprain. Denies falls. Skin: Denies puritis, rash, hives, warts, acne, eczema, changing in skin lesion Neuro: No weakness, tremor, incoordination, spasms, paresthesia, pain Psychiatric: Denies confusion, memory loss, sensory  loss. Denies Depression. Endocrine: Denies change in weight, skin, hair change, nocturia, and paresthesia, diabetic polys, visual blurring, hyper / hypo glycemic episodes.  Heme/Lymph: No excessive bleeding, bruising, enlarged lymph nodes.  Physical Exam  BP 128/80   Pulse 90   Temp 97.8 F (36.6 C)   Resp 16   Ht 5' 3.5" (1.613 m)   Wt 147 lb 9.6 oz (67 kg)   SpO2 95%   BMI 25.74 kg/m   General Appearance: Well nourished, well groomed and in no apparent distress.  Eyes: PERRLA, EOMs, conjunctiva no swelling or erythema, normal fundi and vessels. Sinuses: No frontal/maxillary tenderness ENT/Mouth: EACs patent / TMs  nl. Nares clear without erythema, swelling, mucoid exudates. Oral hygiene is good. No erythema, swelling, or exudate. Tongue normal, non-obstructing. Tonsils not swollen or erythematous. Hearing normal.  Neck: Supple, thyroid not palpable. No bruits, nodes or JVD. Respiratory: Respiratory effort normal.  BS equal and clear bilateral without rales, rhonci, wheezing or stridor. Cardio: Heart sounds are normal with regular rate and rhythm and no murmurs, rubs or gallops. Peripheral pulses are normal and equal bilaterally without edema. No aortic or femoral bruits. Chest: symmetric with normal excursions and percussion. Breasts: Symmetric, without lumps, nipple discharge, retractions, or fibrocystic changes.  Abdomen: Flat, soft with bowel sounds active. Nontender, no guarding, rebound,  hernias, masses, or organomegaly.  Lymphatics: Non tender without lymphadenopathy.  Genitourinary:  Musculoskeletal: Full ROM all peripheral extremities, joint stability, 5/5 strength, and normal gait. Skin: Warm and dry without rashes, lesions, cyanosis, clubbing or  ecchymosis.  Neuro: Cranial nerves intact, reflexes equal bilaterally. Normal muscle tone, no cerebellar symptoms. Sensation intact.  Pysch: Alert and oriented X 3, normal affect, Insight and Judgment appropriate.    Assessment and Plan  1. Labile hypertension  - EKG 12-Lead - CBC with Differential/Platelet - COMPLETE METABOLIC PANEL WITH GFR - Magnesium - TSH  2. Hyperlipidemia, mixed  - EKG 12-Lead - Lipid panel - TSH  3. Abnormal glucose  - EKG 12-Lead - Hemoglobin A1c - Insulin, random  4. Vitamin D deficiency  - VITAMIN D 25 Hydroxy   5. Aortic atherosclerosis ([place]) by CT Lung Scan in July 2021  - EKG 12-Lead - Lipid panel  6. Prediabetes  - EKG 12-Lead - Hemoglobin A1c - Insulin, random  7. Stage 3a chronic kidney disease (HCC)  - PTH, intact and calcium - COMPLETE METABOLIC PANEL WITH GFR  8. Chronic bronchitis ([place])   9. Screening for colorectal cancer  - POC Hemoccult Bld/Stl  10. Encounter for screening for malignant neoplasm of respiratory organs  - Medicare won't cover scan   11. Screening for ischemic heart disease  - EKG 12-Lead  12. FHx: heart disease  - EKG 12-Lead  13. Smoker  - EKG 12-Lead  14. Medication management  - Urinalysis, Routine w reflex microscopic - Microalbumin / creatinine urine ratio - CBC with Differential/Platelet - COMPLETE METABOLIC PANEL WITH GFR - Magnesium - Lipid panel - TSH - Hemoglobin A1c - Insulin, random - VITAMIN D 25 Hydroxy          Patient was counseled in prudent diet to achieve/maintain BMI less than 25 for weight control, BP monitoring, regular exercise and medications. Discussed med's effects and SE's. Screening  labs and tests as requested with regular follow-up as recommended. Over 40 minutes of exam, counseling, chart review and high complex critical decision making was performed.   Kirtland Bouchard, MD

## 2021-10-15 ENCOUNTER — Encounter: Payer: Self-pay | Admitting: Internal Medicine

## 2021-10-15 ENCOUNTER — Ambulatory Visit (INDEPENDENT_AMBULATORY_CARE_PROVIDER_SITE_OTHER): Payer: Medicare Other | Admitting: Internal Medicine

## 2021-10-15 ENCOUNTER — Other Ambulatory Visit: Payer: Self-pay

## 2021-10-15 VITALS — BP 128/80 | HR 90 | Temp 97.8°F | Resp 16 | Ht 63.5 in | Wt 147.6 lb

## 2021-10-15 DIAGNOSIS — Z122 Encounter for screening for malignant neoplasm of respiratory organs: Secondary | ICD-10-CM

## 2021-10-15 DIAGNOSIS — E559 Vitamin D deficiency, unspecified: Secondary | ICD-10-CM | POA: Diagnosis not present

## 2021-10-15 DIAGNOSIS — Z23 Encounter for immunization: Secondary | ICD-10-CM

## 2021-10-15 DIAGNOSIS — I10 Essential (primary) hypertension: Secondary | ICD-10-CM | POA: Diagnosis not present

## 2021-10-15 DIAGNOSIS — Z8249 Family history of ischemic heart disease and other diseases of the circulatory system: Secondary | ICD-10-CM | POA: Diagnosis not present

## 2021-10-15 DIAGNOSIS — Z136 Encounter for screening for cardiovascular disorders: Secondary | ICD-10-CM | POA: Diagnosis not present

## 2021-10-15 DIAGNOSIS — F5101 Primary insomnia: Secondary | ICD-10-CM

## 2021-10-15 DIAGNOSIS — F17200 Nicotine dependence, unspecified, uncomplicated: Secondary | ICD-10-CM

## 2021-10-15 DIAGNOSIS — E782 Mixed hyperlipidemia: Secondary | ICD-10-CM

## 2021-10-15 DIAGNOSIS — R7309 Other abnormal glucose: Secondary | ICD-10-CM | POA: Diagnosis not present

## 2021-10-15 DIAGNOSIS — R0989 Other specified symptoms and signs involving the circulatory and respiratory systems: Secondary | ICD-10-CM

## 2021-10-15 DIAGNOSIS — N1831 Chronic kidney disease, stage 3a: Secondary | ICD-10-CM | POA: Diagnosis not present

## 2021-10-15 DIAGNOSIS — R7303 Prediabetes: Secondary | ICD-10-CM | POA: Diagnosis not present

## 2021-10-15 DIAGNOSIS — Z1211 Encounter for screening for malignant neoplasm of colon: Secondary | ICD-10-CM

## 2021-10-15 DIAGNOSIS — F17210 Nicotine dependence, cigarettes, uncomplicated: Secondary | ICD-10-CM

## 2021-10-15 DIAGNOSIS — Z87891 Personal history of nicotine dependence: Secondary | ICD-10-CM

## 2021-10-15 DIAGNOSIS — I700 Atherosclerosis of aorta: Secondary | ICD-10-CM | POA: Diagnosis not present

## 2021-10-15 DIAGNOSIS — Z79899 Other long term (current) drug therapy: Secondary | ICD-10-CM

## 2021-10-15 DIAGNOSIS — J42 Unspecified chronic bronchitis: Secondary | ICD-10-CM | POA: Diagnosis not present

## 2021-10-15 DIAGNOSIS — M810 Age-related osteoporosis without current pathological fracture: Secondary | ICD-10-CM

## 2021-10-15 MED ORDER — TRAZODONE HCL 150 MG PO TABS: ORAL_TABLET | ORAL | 3 refills | Status: DC

## 2021-10-15 MED ORDER — ALENDRONATE SODIUM 70 MG PO TABS: ORAL_TABLET | ORAL | 3 refills | Status: DC

## 2021-10-16 ENCOUNTER — Other Ambulatory Visit: Payer: Self-pay | Admitting: Internal Medicine

## 2021-10-16 DIAGNOSIS — N3000 Acute cystitis without hematuria: Secondary | ICD-10-CM

## 2021-10-16 LAB — COMPLETE METABOLIC PANEL WITH GFR
AG Ratio: 1.7 (calc) (ref 1.0–2.5)
ALT: 32 U/L — ABNORMAL HIGH (ref 6–29)
AST: 17 U/L (ref 10–35)
Albumin: 4.3 g/dL (ref 3.6–5.1)
Alkaline phosphatase (APISO): 66 U/L (ref 37–153)
BUN/Creatinine Ratio: 21 (calc) (ref 6–22)
BUN: 23 mg/dL (ref 7–25)
CO2: 28 mmol/L (ref 20–32)
Calcium: 9.9 mg/dL (ref 8.6–10.4)
Chloride: 107 mmol/L (ref 98–110)
Creat: 1.1 mg/dL — ABNORMAL HIGH (ref 0.60–1.00)
Globulin: 2.6 g/dL (calc) (ref 1.9–3.7)
Glucose, Bld: 98 mg/dL (ref 65–99)
Potassium: 4.3 mmol/L (ref 3.5–5.3)
Sodium: 142 mmol/L (ref 135–146)
Total Bilirubin: 0.3 mg/dL (ref 0.2–1.2)
Total Protein: 6.9 g/dL (ref 6.1–8.1)
eGFR: 52 mL/min/{1.73_m2} — ABNORMAL LOW (ref >=60)

## 2021-10-16 LAB — URINALYSIS, ROUTINE W REFLEX MICROSCOPIC
Bilirubin Urine: NEGATIVE
Glucose, UA: NEGATIVE
Hyaline Cast: NONE SEEN /LPF
Nitrite: NEGATIVE
Specific Gravity, Urine: 1.023 (ref 1.001–1.035)
WBC, UA: >=60 /HPF — AB (ref 0–5)
pH: 5.5 (ref 5.0–8.0)

## 2021-10-16 LAB — LIPID PANEL
Cholesterol: 142 mg/dL (ref <200)
HDL: 42 mg/dL — ABNORMAL LOW (ref >=50)
LDL Cholesterol (Calc): 62 mg/dL (calc)
Non-HDL Cholesterol (Calc): 100 mg/dL (calc) (ref <130)
Total CHOL/HDL Ratio: 3.4 (calc) (ref <5.0)
Triglycerides: 318 mg/dL — ABNORMAL HIGH (ref <150)

## 2021-10-16 LAB — TSH: TSH: 0.85 mIU/L (ref 0.40–4.50)

## 2021-10-16 LAB — CBC WITH DIFFERENTIAL/PLATELET
Absolute Monocytes: 621 cells/uL (ref 200–950)
Basophils Absolute: 54 cells/uL (ref 0–200)
Basophils Relative: 0.5 %
Eosinophils Absolute: 235 cells/uL (ref 15–500)
Eosinophils Relative: 2.2 %
HCT: 43.2 % (ref 35.0–45.0)
Hemoglobin: 14.4 g/dL (ref 11.7–15.5)
Lymphs Abs: 2600 cells/uL (ref 850–3900)
MCH: 30.8 pg (ref 27.0–33.0)
MCHC: 33.3 g/dL (ref 32.0–36.0)
MCV: 92.5 fL (ref 80.0–100.0)
MPV: 11.7 fL (ref 7.5–12.5)
Monocytes Relative: 5.8 %
Neutro Abs: 7190 cells/uL (ref 1500–7800)
Neutrophils Relative %: 67.2 %
Platelets: 289 10*3/uL (ref 140–400)
RBC: 4.67 10*6/uL (ref 3.80–5.10)
RDW: 12.5 % (ref 11.0–15.0)
Total Lymphocyte: 24.3 %
WBC: 10.7 10*3/uL (ref 3.8–10.8)

## 2021-10-16 LAB — INSULIN, RANDOM: Insulin: 44.6 u[IU]/mL — ABNORMAL HIGH

## 2021-10-16 LAB — MICROALBUMIN / CREATININE URINE RATIO
Creatinine, Urine: 128 mg/dL (ref 20–275)
Microalb Creat Ratio: 58 mcg/mg creat — ABNORMAL HIGH (ref <30)
Microalb, Ur: 7.4 mg/dL

## 2021-10-16 LAB — PTH, INTACT AND CALCIUM
Calcium: 9.9 mg/dL (ref 8.6–10.4)
PTH: 40 pg/mL (ref 16–77)

## 2021-10-16 LAB — MAGNESIUM: Magnesium: 2 mg/dL (ref 1.5–2.5)

## 2021-10-16 LAB — HEMOGLOBIN A1C
Hgb A1c MFr Bld: 5.3 % of total Hgb (ref <5.7)
Mean Plasma Glucose: 105 mg/dL
eAG (mmol/L): 5.8 mmol/L

## 2021-10-16 LAB — VITAMIN D 25 HYDROXY (VIT D DEFICIENCY, FRACTURES): Vit D, 25-Hydroxy: 58 ng/mL (ref 30–100)

## 2021-10-16 NOTE — Progress Notes (Signed)
============================================================ -   Test results slightly outside the reference range are not unusual. If there is anything important, I will review this with you,  otherwise it is considered normal test values.  If you have further questions,  please do not hesitate to contact me at the office or via My Chart.  ============================================================ ============================================================  -  U/A - suspicious for UTI - Will ask lab to do a Urine culture   - Results probably will take 3-5 days to get back  ============================================================ ============================================================  - PTH - is hormone that regulates calcium balance & is Normal  ============================================================ ============================================================  - Kidney functions still Stage 3a & stable  ============================================================ ============================================================  - Total Chol = 142      &    LDL Chol = 62      -   Both    Excellent   - Very low risk for Heart Attack  / Stroke ============================================================ ============================================================  -  But Triglycerides (   318   ) or fats in blood are too high  (goal is less than 150)    - Recommend avoid fried & greasy foods,  sweets / candy,   - Avoid white rice  (brown or wild rice or Quinoa is OK),   - Avoid white potatoes  (sweet potatoes are OK)   - Avoid anything made from white flour  - bagels, doughnuts, rolls, buns, biscuits, white and   wheat breads, pizza crust and traditional  pasta made of white flour & egg white  - (vegetarian pasta or spinach or wheat pasta is OK).    - Multi-grain bread is OK - like multi-grain flat bread or  sandwich thins.   - Avoid alcohol in excess.    - Exercise is also important. ============================================================ ============================================================  - A1c - Normal  -  No Diabetes   - Great ! ============================================================ ============================================================  - Vitamin D = 58 - OK  ============================================================ ============================================================  - All Else - CBC - Kidneys - Electrolytes - Liver - Magnesium & Thyroid    - all  Normal / OK ============================================================ ============================================================

## 2021-10-17 DIAGNOSIS — N3000 Acute cystitis without hematuria: Secondary | ICD-10-CM | POA: Diagnosis not present

## 2021-10-17 NOTE — Addendum Note (Signed)
Addended by: [name] on: 10/17/2021 02:32 PM   Modules accepted: Orders

## 2021-10-18 LAB — URINE CULTURE
MICRO NUMBER:: 12529827
SPECIMEN QUALITY:: ADEQUATE

## 2021-10-19 NOTE — Progress Notes (Signed)
============================================================ ============================================================  -    Urine Culture OK - Did not show Infection  ( It did show contamination with skin bacteria which does not warrant treatment) ============================================================ ============================================================

## 2021-11-02 ENCOUNTER — Ambulatory Visit
Admission: RE | Admit: 2021-11-02 | Discharge: 2021-11-02 | Disposition: A | Discharge status: Home / Self Care | Payer: Medicare Other | Source: Ambulatory Visit | Attending: Internal Medicine | Admitting: Internal Medicine

## 2021-11-02 DIAGNOSIS — F17210 Nicotine dependence, cigarettes, uncomplicated: Secondary | ICD-10-CM | POA: Diagnosis not present

## 2021-11-02 DIAGNOSIS — Z87891 Personal history of nicotine dependence: Secondary | ICD-10-CM

## 2021-11-02 DIAGNOSIS — F17200 Nicotine dependence, unspecified, uncomplicated: Secondary | ICD-10-CM

## 2021-11-02 DIAGNOSIS — Z122 Encounter for screening for malignant neoplasm of respiratory organs: Secondary | ICD-10-CM

## 2021-11-04 ENCOUNTER — Other Ambulatory Visit: Payer: Self-pay | Admitting: Internal Medicine

## 2021-11-04 ENCOUNTER — Other Ambulatory Visit: Payer: Self-pay

## 2021-11-04 DIAGNOSIS — Z1211 Encounter for screening for malignant neoplasm of colon: Secondary | ICD-10-CM

## 2021-11-04 DIAGNOSIS — J849 Interstitial pulmonary disease, unspecified: Secondary | ICD-10-CM

## 2021-11-04 DIAGNOSIS — R918 Other nonspecific abnormal finding of lung field: Secondary | ICD-10-CM

## 2021-11-04 LAB — POC HEMOCCULT BLD/STL (HOME/3-CARD/SCREEN)
Card #2 Fecal Occult Blod, POC: NEGATIVE
Card #3 Fecal Occult Blood, POC: NEGATIVE
Fecal Occult Blood, POC: NEGATIVE

## 2021-11-04 NOTE — Progress Notes (Signed)
-   CT lung scan shows some subtle changes suspicious  for worsening fibrous or scar changes in lung tissues & Radiologist recommended a Pulmonary or Lung specialist consultation

## 2021-11-05 DIAGNOSIS — Z1211 Encounter for screening for malignant neoplasm of colon: Secondary | ICD-10-CM | POA: Diagnosis not present

## 2021-11-05 DIAGNOSIS — Z1212 Encounter for screening for malignant neoplasm of rectum: Secondary | ICD-10-CM | POA: Diagnosis not present

## 2021-11-13 ENCOUNTER — Other Ambulatory Visit: Payer: Self-pay

## 2021-11-13 ENCOUNTER — Encounter: Payer: Self-pay | Admitting: Pulmonary Disease

## 2021-11-13 ENCOUNTER — Ambulatory Visit (INDEPENDENT_AMBULATORY_CARE_PROVIDER_SITE_OTHER): Payer: Medicare Other | Admitting: Pulmonary Disease

## 2021-11-13 VITALS — BP 124/76 | HR 88 | Temp 97.8°F | Ht 63.0 in | Wt 147.0 lb

## 2021-11-13 DIAGNOSIS — J849 Interstitial pulmonary disease, unspecified: Secondary | ICD-10-CM

## 2021-11-13 DIAGNOSIS — R918 Other nonspecific abnormal finding of lung field: Secondary | ICD-10-CM | POA: Diagnosis not present

## 2021-11-13 DIAGNOSIS — F17210 Nicotine dependence, cigarettes, uncomplicated: Secondary | ICD-10-CM | POA: Diagnosis not present

## 2021-11-13 DIAGNOSIS — F17200 Nicotine dependence, unspecified, uncomplicated: Secondary | ICD-10-CM

## 2021-11-13 DIAGNOSIS — R911 Solitary pulmonary nodule: Secondary | ICD-10-CM | POA: Diagnosis not present

## 2021-11-13 NOTE — Patient Instructions (Signed)
Thank you for visiting Dr. Valeta Harms at [facility]. Today we recommend the following:  Orders Placed This Encounter  Procedures   CT CHEST HIGH RESOLUTION   Pulmonary Function Test   Both tests prior to next office visit  Return in about 6 months (around 05/13/2022) for with Eric Form, NP, or Dr. Valeta Harms.    Please do your part to reduce the spread of COVID-19.    You must quit smoking or vaping. This is the single most important thing that you can do to improve your lung health.   S = Set a quit date. T = Tell family, friends, and the people around you that you plan to quit. A = Anticipate or plan ahead for the tough times you'll face while quitting. R = Remove cigarettes and other tobacco products from your home, car, and work T = Talk to [place] about getting help to quit  If you need help feel free to reach out to our office, [place] [facility]: [phone], call 1-800-QUIT-NOW, or visit [email or web address]

## 2021-11-13 NOTE — Progress Notes (Signed)
Smoking Cessation Counseling:   The patient's current tobacco use: 43+ years, 0.5-1 ppd  The patient was advised to quit and impact of smoking on their health.  I assessed the patient's willingness to attempt to quit. I provided methods and skills for cessation. We reviewed medication management of smoking session drugs if appropriate. Discussed tapering method today  Resources to help quit smoking were provided. A smoking cessation quit date was set: 02/11/2022  Follow-up was arranged in our clinic.  The amount of time spent counseling patient was 5 mins    Garner Nash, DO [facility] 11/13/2021 10:40 AM

## 2021-11-13 NOTE — Progress Notes (Signed)
Synopsis: Referred in November 2022 for lung nodule by [name], MD  Subjective:   PATIENT ID: Alison Campbell GENDER: female DOB: 1946-12-09, MRN: [id number]  Chief Complaint  Patient presents with   Consult    Patient is here to talk about nodule in lung    This is a 75 year old female, past medical history of gastroesophageal reflux, CKD, hyperlipidemia.Patient had a lung cancer screening CT on 11/02/2021.  Lung cancer screening CT revealed multiple small pulmonary nodules.  There was a new pleural-based nodule in the right upper lobe.  With a mean dry volume of 5.3 mm in diameter.  No other significant suspicious lesions.  Also has associated centrilobular and paraseptal emphysema.  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  Patient was referred to pulmonary for evaluation.  OV 11/13/2021: Here today to discuss abnormal CT imaging.  We also had a long discussion regarding smoking cessation.  Please see separate documentation regarding smoking cessation.  Patient has smoked since age 1.  She quit for 9 years however has had approximately 40+ pack years smoking at around half to 1 full pack per day.   Past Medical History:  Diagnosis Date   Anxiety    CKD (chronic kidney disease) stage 3, GFR 30-59 ml/min (HCC) 12/24/2020   Colon polyps    Costochondritis    GERD (gastroesophageal reflux disease)    coffee related   Hyperlipidemia    Insomnia    Osteoporosis    Pre-diabetes      Family History  Problem Relation Age of Onset   Heart disease Mother    Heart attack Mother    Heart disease Father    Diabetes Father    Heart attack Father    Cancer Brother        Lung   Bipolar disorder Daughter    Colon cancer Neg Hx    Esophageal cancer Neg Hx    Stomach cancer Neg Hx    Rectal cancer Neg Hx      Past Surgical History:  Procedure Laterality Date   ABDOMINAL HYSTERECTOMY     COLONOSCOPY   09/10/2011   hysterectomy     POLYPECTOMY     UVULECTOMY N/A 09/09/2016   Procedure: PARTIAL UVULECTOMY, direct laryngoscopy;  Surgeon: Rozetta Nunnery, MD;  Location: [facility];  Service: ENT;  Laterality: N/A;  PARTIAL UVULECTOMY, direct laryngoscopy    Social History   Socioeconomic History   Marital status: Widowed    Spouse name: Not on file   Number of children: Not on file   Years of education: Not on file   Highest education level: Not on file  Occupational History   Not on file  Tobacco Use   Smoking status: Every Day    Packs/day: 0.50    Years: 50.00    Pack years: 25.00    Types: Cigarettes   Smokeless tobacco: Never  Vaping Use   Vaping Use: Never used  Substance and Sexual Activity   Alcohol use: No    Alcohol/week: 0.0 standard drinks   Drug use: No   Sexual activity: Not on file  Other Topics Concern   Not on file  Social History Narrative   Not on file   Social Determinants of Health   Financial Resource Strain: Not on file  Food Insecurity: Not on file  Transportation Needs: Not on file  Physical Activity: Not on file  Stress: Not  on file  Social Connections: Not on file  Intimate Partner Violence: Not on file     Allergies  Allergen Reactions   Fish Oil Diarrhea     Outpatient Medications Prior to Visit  Medication Sig Dispense Refill   alendronate (FOSAMAX) 70 MG tablet TAKE 1 TABLET BY MOUTH EVERY 7 DAYS WITH A FULL GLASS OF WATER ON AN EMPTY STOMACH FOR AN HOUR 13 tablet 3   ALPRAZolam (XANAX) 1 MG tablet 1/2 to 1 tablet 2 to 3 x daily as needed for anxiety or sleep 270 tablet 1   aspirin 81 MG EC tablet Take 1 tablet daily     CALCIUM PO Take 2 tablets by mouth daily. 600 mg  2 per day     Cholecalciferol (VITAMIN D PO) Take 2,000 Units by mouth daily. Take 5 caps to = 10,000 units daily.     Cyanocobalamin (VITAMIN B 12 PO) Take 1 tablet by mouth daily.     ezetimibe (ZETIA) 10 MG tablet TAKE 1 TABLET BY MOUTH  DAILY FOR CHOLESTEROL 90 tablet 3   Multiple Vitamin (MULTIVITAMIN PO) Take 1 tablet by mouth daily.     Psyllium (METAMUCIL FIBER PO) Take by mouth daily.     rosuvastatin (CRESTOR) 20 MG tablet Take 1 tablet (20 mg total) by mouth daily. 90 tablet 3   traZODone (DESYREL) 150 MG tablet Take 1/3 tab (50 mg) to 1/2 tab (75 mg) to 1 tablet  1 hour  before Bedtime as needed for Sleep 90 tablet 3   chlorhexidine (PERIDEX) 0.12 % solution SMARTSIG:By Mouth (Patient not taking: No sig reported)     No facility-administered medications prior to visit.    Review of Systems  Constitutional:  Negative for chills, fever, malaise/fatigue and weight loss.  HENT:  Negative for hearing loss, sore throat and tinnitus.   Eyes:  Negative for blurred vision and double vision.  Respiratory:  Negative for cough, hemoptysis, sputum production, shortness of breath, wheezing and stridor.   Cardiovascular:  Negative for chest pain, palpitations, orthopnea, leg swelling and PND.  Gastrointestinal:  Negative for abdominal pain, constipation, diarrhea, heartburn, nausea and vomiting.  Genitourinary:  Negative for dysuria, hematuria and urgency.  Musculoskeletal:  Negative for joint pain and myalgias.  Skin:  Negative for itching and rash.  Neurological:  Negative for dizziness, tingling, weakness and headaches.  Endo/Heme/Allergies:  Negative for environmental allergies. Does not bruise/bleed easily.  Psychiatric/Behavioral:  Negative for depression. The patient is not nervous/anxious and does not have insomnia.   All other systems reviewed and are negative.   Objective:  Physical Exam Vitals reviewed.  Constitutional:      General: She is not in acute distress.    Appearance: She is well-developed.  HENT:     Head: Normocephalic and atraumatic.  Eyes:     General: No scleral icterus.    Conjunctiva/sclera: Conjunctivae normal.     Pupils: Pupils are equal, round, and reactive to light.  Neck:      Vascular: No JVD.     Trachea: No tracheal deviation.  Cardiovascular:     Rate and Rhythm: Normal rate and regular rhythm.     Heart sounds: Normal heart sounds. No murmur heard. Pulmonary:     Effort: Pulmonary effort is normal. No tachypnea, accessory muscle usage or respiratory distress.     Breath sounds: No stridor. No wheezing, rhonchi or rales.     Comments: No crackles Abdominal:     General: Bowel sounds are  normal. There is no distension.     Palpations: Abdomen is soft.     Tenderness: There is no abdominal tenderness.  Musculoskeletal:        General: No tenderness.     Cervical back: Neck supple.  Lymphadenopathy:     Cervical: No cervical adenopathy.  Skin:    General: Skin is warm and dry.     Capillary Refill: Capillary refill takes less than 2 seconds.     Findings: No rash.  Neurological:     Mental Status: She is alert and oriented to person, place, and time.  Psychiatric:        Behavior: Behavior normal.     Vitals:   11/13/21 1020  BP: 124/76  Pulse: 88  Temp: 97.8 F (36.6 C)  TempSrc: Oral  SpO2: 97%  Weight: 147 lb (66.7 kg)  Height: 5\' 3"  (1.6 m)   97% on RA BMI Readings from Last 3 Encounters:  11/13/21 26.04 kg/m  10/15/21 25.74 kg/m  01/03/21 25.35 kg/m   Wt Readings from Last 3 Encounters:  11/13/21 147 lb (66.7 kg)  10/15/21 147 lb 9.6 oz (67 kg)  01/03/21 145 lb 6.4 oz (66 kg)     CBC    Component Value Date/Time   WBC 10.7 10/15/2021 1348   RBC 4.67 10/15/2021 1348   HGB 14.4 10/15/2021 1348   HCT 43.2 10/15/2021 1348   PLT 289 10/15/2021 1348   MCV 92.5 10/15/2021 1348   MCH 30.8 10/15/2021 1348   MCHC 33.3 10/15/2021 1348   RDW 12.5 10/15/2021 1348   LYMPHSABS 2,600 10/15/2021 1348   MONOABS 516 03/23/2017 1053   EOSABS 235 10/15/2021 1348   BASOSABS 54 10/15/2021 1348    Chest Imaging: November 2022 lung cancer screening CT: Lung RADS 3, multiple small pulmonary nodules, right upper lobe pulmonary nodule  5.3 mm with associated centrilobular and paraseptal emphysema. The patient's images have been independently reviewed by me.    Pulmonary Functions Testing Results: No flowsheet data found.  FeNO:   Pathology:   Echocardiogram:   Heart Catheterization:     Assessment & Plan:     ICD-10-CM   1. Right upper lobe pulmonary nodule  R91.1 Pulmonary Function Test    CT CHEST HIGH RESOLUTION    2. ILD (interstitial lung disease) ([place])  J84.9     3. Tobacco use disorder  F17.200     4. Abnormal CT lung screening  R91.8       Discussion:  This is a 75 year old female, longstanding tobacco abuse history, interstitial lung disease, abnormal lung cancer screening CT scan.  She had a lung cancer screening CT with basilar changes and early scarring consistent with probable UIP pattern.  This is subtle I have reviewed the images as well.  She has not had PFTs in the past may just be related to her ongoing smoking history.  Plan: Repeat high-resolution CT scan of the chest in 6 months. If there is any progressive interstitial lung disease changes I will have her follow-up in our ILD clinic. At the same time we will follow-up on her right upper lobe pulmonary nodule 5 mm in size that is subpleural. We also spent time today discussing smoking cessation.  Please see separate documentation.  Return to clinic in 6 months with full pulmonary function tests and repeat HRCT imaging.    Current Outpatient Medications:    alendronate (FOSAMAX) 70 MG tablet, TAKE 1 TABLET BY MOUTH EVERY 7 DAYS WITH  A FULL GLASS OF WATER ON AN EMPTY STOMACH FOR AN HOUR, Disp: 13 tablet, Rfl: 3   ALPRAZolam (XANAX) 1 MG tablet, 1/2 to 1 tablet 2 to 3 x daily as needed for anxiety or sleep, Disp: 270 tablet, Rfl: 1   aspirin 81 MG EC tablet, Take 1 tablet daily, Disp: , Rfl:    CALCIUM PO, Take 2 tablets by mouth daily. 600 mg  2 per day, Disp: , Rfl:    Cholecalciferol (VITAMIN D PO), Take 2,000 Units by mouth daily.  Take 5 caps to = 10,000 units daily., Disp: , Rfl:    Cyanocobalamin (VITAMIN B 12 PO), Take 1 tablet by mouth daily., Disp: , Rfl:    ezetimibe (ZETIA) 10 MG tablet, TAKE 1 TABLET BY MOUTH DAILY FOR CHOLESTEROL, Disp: 90 tablet, Rfl: 3   Multiple Vitamin (MULTIVITAMIN PO), Take 1 tablet by mouth daily., Disp: , Rfl:    Psyllium (METAMUCIL FIBER PO), Take by mouth daily., Disp: , Rfl:    rosuvastatin (CRESTOR) 20 MG tablet, Take 1 tablet (20 mg total) by mouth daily., Disp: 90 tablet, Rfl: 3   traZODone (DESYREL) 150 MG tablet, Take 1/3 tab (50 mg) to 1/2 tab (75 mg) to 1 tablet  1 hour  before Bedtime as needed for Sleep, Disp: 90 tablet, Rfl: 3   chlorhexidine (PERIDEX) 0.12 % solution, SMARTSIG:By Mouth (Patient not taking: No sig reported), Disp: , Rfl:    Garner Nash, DO [facility] 11/13/2021 10:33 AM

## 2021-12-11 ENCOUNTER — Other Ambulatory Visit: Payer: Self-pay

## 2021-12-11 DIAGNOSIS — E782 Mixed hyperlipidemia: Secondary | ICD-10-CM

## 2021-12-11 MED ORDER — EZETIMIBE 10 MG PO TABS: ORAL_TABLET | ORAL | 3 refills | Status: DC

## 2021-12-11 MED ORDER — ROSUVASTATIN CALCIUM 20 MG PO TABS: 20.0000 mg | ORAL_TABLET | Freq: Every day | ORAL | 0 refills | Status: DC

## 2021-12-12 ENCOUNTER — Other Ambulatory Visit: Payer: Self-pay | Admitting: Internal Medicine

## 2021-12-12 DIAGNOSIS — M810 Age-related osteoporosis without current pathological fracture: Secondary | ICD-10-CM

## 2021-12-12 MED ORDER — ALENDRONATE SODIUM 70 MG PO TABS: ORAL_TABLET | ORAL | 3 refills | Status: DC

## 2021-12-27 ENCOUNTER — Other Ambulatory Visit: Payer: Self-pay | Admitting: Cardiology

## 2022-01-14 DIAGNOSIS — I2510 Atherosclerotic heart disease of native coronary artery without angina pectoris: Secondary | ICD-10-CM | POA: Insufficient documentation

## 2022-01-14 NOTE — Progress Notes (Signed)
Patient ID: Alison Campbell, female   DOB: [birth year], 76 y.o.   MRN: [id number]   Medicare wellness and 3 month follow up  Assessment:   Encounter for Annual Physical Exam with abnormal findings Due annually  Health Maintenance reviewed Healthy lifestyle reviewed and goals set Hep C screening today  Essential hypertension - continue medications, DASH diet, exercise and monitor at home. Call if greater than 130/80.  -     CBC with Differential/Platelet -     COMPLETE METABOLIC PANEL WITH GFR -     TSH  Aortic atherosclerosis ([place]) - CT 06/2020 Control blood pressure, cholesterol, glucose, increase exercise.   3 Vessel CAD Per imaging; denies angina or dyspnea  Control blood pressure, cholesterol (LDL <70), bASA,  STOP SMOKING, increase exercise    Obstructive chronic bronchitis without exacerbation ([place]) Advised to stop smoking - risks discussed. Declines medications.  CT low dose annually- now follows with pulm, pending PFTs Currently stable without respiratory medications  Mixed hyperlipidemia check lipids LDL goal <70 decrease fatty foods increase activity. -     Lipid panel  Abnormal glucose Discussed disease progression and risks Discussed diet/exercise, weight management and risk modification  Medication management -     Magnesium  Tobacco use disorder Smoking cessation-  instruction/counseling given, counseled patient on the dangers of tobacco use, advised patient to stop smoking, and reviewed strategies to maximize success, patient not ready to quit at this time.  - patient now follows with pulm for lung cancer screening   Vitamin D deficiency Continue supplement  CKD 3 ([place]) Increase fluids, avoid frequent/chronic NSAIDS, monitor sugars, will monitor  Diverticulosis of large intestine without hemorrhage Monitor Increase fiber  Osteoporosis, unspecified osteoporosis type, unspecified pathological fracture presence Has been on fosamax x 5  years Advised to stop fosamax for at least 1 year Will recheck DEXA, overdue - order entered and given phone number to schedule  History of colon polyp UTD, high fiber,   Left leg pain Suggestive of IT band tightness Meloxicam x 2 weeks then PRN Stretches given, follow up if persistent  Orders Placed This Encounter  Procedures   DG Bone Density   CBC with Differential/Platelet   COMPLETE METABOLIC PANEL WITH GFR   Lipid panel   TSH   Hepatitis C antibody      Future Appointments  Date Time Provider [place]  04/15/2022  2:30 PM [name], MD [facility] None  10/15/2022  2:00 PM [name], MD [facility] None  01/15/2023 11:00 AM [name], NP [facility] None      Plan:   During the course of the visit the patient was educated and counseled about appropriate screening and preventive services including:   Pneumococcal vaccine  Prevnar 13 Influenza vaccine Td vaccine Screening electrocardiogram Bone densitometry screening Colorectal cancer screening Diabetes screening Glaucoma screening Nutrition counseling  Advanced directives: requested   Subjective:   Alison Campbell is a 76 y.o. female who presents for Medicare Annual Wellness Visit and 3 month follow up. She has Diverticulosis of large intestine; History of colon polyps; Essential hypertension; Hyperlipidemia, mixed; Abnormal glucose; Chronic bronchitis ([place]); Vitamin D deficiency; Tobacco use disorder; Osteoporosis; Aortic atherosclerosis ([place]) by CT Lung Scan in July 2021; Insomnia; Dyshidrotic foot dermatitis; BMI 25.0-25.9,adult; CKD (chronic kidney disease) stage 3, GFR 30-59 ml/min (HCC); and 3-vessel CAD on their problem list.   She reports 3 weeks ago pivoted and started having left lateral leg pain, initially near hip down to lateral knee, now lateral  calf, burning and tight sensation in her calf. Hasn't tried anything so far. Denies numbness/tingling or lower back pain.    She has hx of osteoporosis, last DEXA was 12/17/2016 Osteoporosis- - 2.7, has been on fosamax for several years, ? Since 2015, hasn't taken a recent break.   She is a smoker since age 33, 40+ pack year hx, has been undergoing annual low dose lung CT for screening. 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 June Leap, planning PFTs and follow up CT.   she currently continues to smoke 0.5 pack a day; discussed risks associated with smoking, patient is not ready to quit.   She has insomnia, currently prescribed trazodone 150 mg and xanax 1 mg (hasn't needed in some time, last refill remote).   She has persistent left sole dyshidrotic dermatitis, didn't resolve with steroid injections by derm, declined further interventions.   BMI is Body mass index is 26.18 kg/m., she has been working on diet, active around her home and garden but no intentional exercise.  Wt Readings from Last 3 Encounters:  01/15/22 147 lb 12.8 oz (67 kg)  11/13/21 147 lb (66.7 kg)  10/15/21 147 lb 9.6 oz (67 kg)   She had normal stress myoview 02/2018. CT 11/02/2021 showed 3 vessel CAD and known aortic atherosclerosis per CT 06/2020.  She is on statin and bASA.   Her blood pressure has been controlled at home, today their BP is BP: 124/70 She does not workout. She denies chest pain, shortness of breath, dizziness.   She is on cholesterol medication, rosuvastatin 20 mg daily and zetia 10 mg daily and denies myalgias. Her cholesterol is at goal. The cholesterol last visit was:   Lab Results  Component Value Date   CHOL 142 10/15/2021   HDL  42 (L) 10/15/2021   LDLCALC 62 10/15/2021   TRIG 318 (H) 10/15/2021   CHOLHDL 3.4 10/15/2021   She has been working on diet and exercise for glucose management, and denies foot ulcerations, paresthesia of the feet and polydipsia. Last A1C in the office was:  Lab Results  Component Value Date   HGBA1C 5.3 10/15/2021   She has CKD IIIa since 2016, monitored at this office, last GFR:  Lab Results  Component Value Date   EGFR 52 (L) 10/15/2021   Patient is on Vitamin D supplement. Lab Results  Component Value Date   VD25OH 22 10/15/2021   She is taking a b12 supplement, declines check today but agreeable at CPE.  No results found for: VITAMINB12    Medication Review Current Outpatient Medications on File Prior to Visit  Medication Sig Dispense Refill   alendronate (FOSAMAX) 70 MG tablet TAKE 1 TABLET BY MOUTH EVERY 7 DAYS WITH A FULL GLASS OF WATER ON AN EMPTY STOMACH FOR AN HOUR 13 tablet 3   aspirin 81 MG EC tablet Take 1 tablet daily     CALCIUM PO Take 2 tablets by mouth daily. 600 mg  2 per day     Cholecalciferol (VITAMIN D PO) Take 2,000 Units by mouth daily. Take 5 caps to = 10,000 units daily.     Cyanocobalamin (VITAMIN B 12 PO) Take  1 tablet by mouth daily.     ezetimibe (ZETIA) 10 MG tablet TAKE 1 TABLET BY MOUTH DAILY FOR CHOLESTEROL 90 tablet 3   Multiple Vitamin (MULTIVITAMIN PO) Take 1 tablet by mouth daily.     Psyllium (METAMUCIL FIBER PO) Take by mouth daily.     rosuvastatin (CRESTOR) 20 MG tablet TAKE 1 TABLET(20 MG) BY MOUTH DAILY 90 tablet 0   traZODone (DESYREL) 150 MG tablet Take 1/3 tab (50 mg) to 1/2 tab (75 mg) to 1 tablet  1 hour  before Bedtime as needed for Sleep 90 tablet 3   ALPRAZolam (XANAX) 1 MG tablet 1/2 to 1 tablet 2 to 3 x daily as needed for anxiety or sleep (Patient not taking: Reported on 01/15/2022) 270 tablet 1   chlorhexidine (PERIDEX) 0.12 % solution SMARTSIG:By Mouth (Patient not taking: Reported on 10/15/2021)     No current  facility-administered medications on file prior to visit.   Allergies  Allergen Reactions   Fish Oil Diarrhea     Current Problems (verified) Patient Active Problem List   Diagnosis Date Noted   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/14/2022   BMI 25.0-25.9,adult 12/24/2020   CKD (chronic kidney disease) stage 3, GFR 30-59 ml/min ([place]) 12/24/2020   Insomnia    Dyshidrotic foot dermatitis    Aortic atherosclerosis ([place]) by CT Lung Scan in July 2021 03/11/2018   Tobacco use disorder 06/21/2015   Osteoporosis 06/21/2015   Essential hypertension 11/07/2013   Hyperlipidemia, mixed 11/07/2013   Abnormal glucose 11/07/2013   Chronic bronchitis ([place]) 11/07/2013   Vitamin D deficiency 11/07/2013   Diverticulosis of large intestine 09/10/2011   History of colon polyps 09/10/2011    Screening Tests Immunization History  Administered Date(s) Administered   Influenza, High Dose Seasonal PF 08/29/2013, 10/25/2015, 11/24/2017, 11/29/2018, 10/15/2021   PFIZER(Purple Top)SARS-COV-2 Vaccination 02/02/2020, 02/28/2020   Pneumococcal Conjugate-13 02/02/2015   Pneumococcal Polysaccharide-23 03/29/2010   Tdap 02/26/2009   Zoster, Live 12/29/2008   Health Maintenance  Topic Date Due   Hepatitis C Screening  Never done   COVID-19 Vaccine (3 - Pfizer risk series) 01/31/2022 (Originally 03/27/2020)   Zoster Vaccines- Shingrix (1 of 2) 04/15/2022 (Originally 06/08/1965)   TETANUS/TDAP  01/15/2023 (Originally 02/27/2019)   COLONOSCOPY (Pts 45-73yr Insurance coverage will need to be confirmed)  01/08/2023   Pneumonia Vaccine 76 Years old  Completed   INFLUENZA VACCINE  Completed   DEXA SCAN  Completed   HPV VACCINES  Aged Out     Last eye: 2022 Last dental: 2022  Patient Care Team: MUnk Pinto MD as PCP - General (Internal Medicine) JMilus Banister MD as Attending Physician (Gastroenterology) PSable Feil MD as Consulting Physician (Gastroenterology) NRozetta Nunnery MD (Inactive) as  Consulting Physician (Otolaryngology) LDruscilla Brownie MD as Consulting Physician (Dermatology) IGarner Nash DO as Consulting Physician (Pulmonary Disease)   Allergies Allergies  Allergen Reactions   Fish Oil Diarrhea    SURGICAL HISTORY She  has a past surgical history that includes hysterectomy; Polypectomy; Abdominal hysterectomy; Uvulectomy (N/A, 09/09/2016); and Colonoscopy (09/10/2011). FAMILY HISTORY Her family history includes Bipolar disorder in her daughter; Cancer in her brother; Diabetes in her father; Heart attack in her father and mother; Heart disease in her father and mother. SOCIAL HISTORY She  reports that she has been smoking cigarettes. She started smoking about 57 years ago. She has a 28.50 pack-year smoking history. She has never used smokeless tobacco. She reports that she does not drink alcohol and does not use drugs.  MEDICARE WELLNESS OBJECTIVES: Physical activity: Current Exercise Habits: The patient has a physically strenuous job, but has no regular exercise apart from work., Exercise limited by: None identified Cardiac risk factors: Cardiac Risk Factors include: advanced age (>27mn, >>54women);dyslipidemia;hypertension;smoking/ tobacco exposure Depression/mood screen:   [facility] 01/15/2022  Decreased Interest 0  Down, Depressed, Hopeless 0  PHQ - 2 Score 0    ADLs:  In your present state of health, do you have any difficulty performing the following activities: 01/15/2022 10/14/2021  Hearing? N N  Vision? N N  Difficulty concentrating or making decisions? N N  Walking or climbing stairs? N N  Dressing or bathing? N N  Doing errands, shopping? N N  Some recent data might be hidden     Cognitive Testing  Alert? Yes  Normal Appearance?Yes  Oriented to person? Yes  Place? Yes   Time? Yes  Recall of three objects?  Yes  Can perform simple calculations? Yes  Displays appropriate judgment?Yes  Can read the correct time from a  watch face?Yes  EOL planning: Does Patient Have a Medical Advance Directive?: Yes Type of Advance Directive: Healthcare Power of Attorney, Living will Does patient want to make changes to medical advance directive?: No - Patient declined Copy of [place] Chart?: No - copy requested     Objective:   Blood pressure 124/70, pulse 96, temperature 97.9 F (36.6 C), resp. rate 16, height _0  (1.6 m), weight 147 lb 12.8 oz (67 kg), SpO2 95 %. Body mass index is 26.18 kg/m.  General appearance: alert, no distress, WD/WN,  female HEENT: normocephalic, sclerae anicteric, TMs pearly, nares patent, no discharge or erythema, pharynx normal, crowded mouth.  Oral cavity: MMM, no lesions Neck: supple, no lymphadenopathy, no thyromegaly, no masses Heart: RRR, normal S1, S2, no murmurs Lungs: CTA bilaterally, no wheezes, rhonchi, or rales Abdomen: +bs, soft, non tender, non distended, no masses, no hepatomegaly, no splenomegaly Musculoskeletal: nontender, no swelling, no obvious deformity Extremities: no edema, no cyanosis, no clubbing Pulses: 2+ symmetric, upper and lower extremities, normal cap refill Skin: Palms of hands and soles of feet with mild erythema/scaling. R sole with scaling and pitting.  Neurological: alert, oriented x 3, CN2-12 intact, strength normal upper extremities and lower extremities, sensation normal throughout, DTRs 2+ throughout, no cerebellar signs, gait normal Psychiatric: normal affect, behavior normal, pleasant   Medicare Attestation I have personally reviewed: The patient's medical and social history Their use of alcohol, tobacco or illicit drugs Their current medications and supplements The patient's functional ability including ADLs,fall risks, home safety risks, cognitive, and hearing and visual impairment Diet and physical activities Evidence for depression or mood disorders  The patient's weight, height, BMI, and visual acuity have  been recorded in the chart.  I have made referrals, counseling, and provided education to the patient based on review of the above and I have provided the patient with a written personalized care plan for preventive services.     AIzora Ribas NP   01/15/2022

## 2022-01-15 ENCOUNTER — Other Ambulatory Visit: Payer: Self-pay

## 2022-01-15 ENCOUNTER — Ambulatory Visit (INDEPENDENT_AMBULATORY_CARE_PROVIDER_SITE_OTHER): Payer: Medicare Other | Admitting: Adult Health

## 2022-01-15 ENCOUNTER — Encounter: Payer: Self-pay | Admitting: Adult Health

## 2022-01-15 ENCOUNTER — Other Ambulatory Visit: Payer: Self-pay | Admitting: Adult Health

## 2022-01-15 VITALS — BP 124/70 | HR 96 | Temp 97.9°F | Resp 16 | Ht 63.0 in | Wt 147.8 lb

## 2022-01-15 DIAGNOSIS — Z0001 Encounter for general adult medical examination with abnormal findings: Secondary | ICD-10-CM

## 2022-01-15 DIAGNOSIS — I10 Essential (primary) hypertension: Secondary | ICD-10-CM | POA: Diagnosis not present

## 2022-01-15 DIAGNOSIS — J42 Unspecified chronic bronchitis: Secondary | ICD-10-CM | POA: Diagnosis not present

## 2022-01-15 DIAGNOSIS — I700 Atherosclerosis of aorta: Secondary | ICD-10-CM | POA: Diagnosis not present

## 2022-01-15 DIAGNOSIS — Z86010 Personal history of colonic polyps: Secondary | ICD-10-CM | POA: Diagnosis not present

## 2022-01-15 DIAGNOSIS — Z1159 Encounter for screening for other viral diseases: Secondary | ICD-10-CM | POA: Diagnosis not present

## 2022-01-15 DIAGNOSIS — E559 Vitamin D deficiency, unspecified: Secondary | ICD-10-CM

## 2022-01-15 DIAGNOSIS — E538 Deficiency of other specified B group vitamins: Secondary | ICD-10-CM

## 2022-01-15 DIAGNOSIS — L301 Dyshidrosis [pompholyx]: Secondary | ICD-10-CM

## 2022-01-15 DIAGNOSIS — G4700 Insomnia, unspecified: Secondary | ICD-10-CM

## 2022-01-15 DIAGNOSIS — R6889 Other general symptoms and signs: Secondary | ICD-10-CM | POA: Diagnosis not present

## 2022-01-15 DIAGNOSIS — R7309 Other abnormal glucose: Secondary | ICD-10-CM | POA: Diagnosis not present

## 2022-01-15 DIAGNOSIS — M810 Age-related osteoporosis without current pathological fracture: Secondary | ICD-10-CM

## 2022-01-15 DIAGNOSIS — K5730 Diverticulosis of large intestine without perforation or abscess without bleeding: Secondary | ICD-10-CM

## 2022-01-15 DIAGNOSIS — I2510 Atherosclerotic heart disease of native coronary artery without angina pectoris: Secondary | ICD-10-CM

## 2022-01-15 DIAGNOSIS — F17200 Nicotine dependence, unspecified, uncomplicated: Secondary | ICD-10-CM

## 2022-01-15 DIAGNOSIS — N1831 Chronic kidney disease, stage 3a: Secondary | ICD-10-CM | POA: Diagnosis not present

## 2022-01-15 DIAGNOSIS — Z0000 Encounter for general adult medical examination without abnormal findings: Secondary | ICD-10-CM

## 2022-01-15 DIAGNOSIS — Z6825 Body mass index (BMI) 25.0-25.9, adult: Secondary | ICD-10-CM

## 2022-01-15 DIAGNOSIS — E782 Mixed hyperlipidemia: Secondary | ICD-10-CM | POA: Diagnosis not present

## 2022-01-15 MED ORDER — MELOXICAM 15 MG PO TABS: ORAL_TABLET | ORAL | 1 refills | Status: DC

## 2022-01-15 NOTE — Patient Instructions (Addendum)
HOW TO SCHEDULE YOUR BONE DENSITY  [facility]  7 a.m.-6:30 p.m., Monday 7 a.m.-5 p.m., Tuesday-Friday Schedule an appointment by calling [phone].    Please call pulmonology to verify your follow up appointment [phone]    Iliotibial Band Syndrome Rehab Ask your health care provider which exercises are safe for you. Do exercises exactly as told by your health care provider and adjust them as directed. It is normal to feel mild stretching, pulling, tightness, or discomfort as you do these exercises. Stop right away if you feel sudden pain or your pain gets significantly worse. Do not begin these exercises until told by your health care provider. Stretching and range-of-motion exercises These exercises warm up your muscles and joints and improve the movement and flexibility of your hip and pelvis. Quadriceps stretch, prone  Lie on your abdomen (prone position) on a firm surface, such as a bed or padded floor. Bend your left / right knee and reach back to hold your ankle or pant leg. If you cannot reach your ankle or pant leg, loop a belt around your foot and grab the belt instead. Gently pull your heel toward your buttocks. Your knee should not slide out to the side. You should feel a stretch in the front of your thigh and knee (quadriceps). Hold this position for __________ seconds. Repeat __________ times. Complete this exercise __________ times a day. Iliotibial band stretch An iliotibial band is a strong band of muscle tissue that runs from the outer side of your hip to the outer side of your thigh and knee. Lie on your side with your left / right leg in the top position. Bend both of your knees and grab your left / right ankle. Stretch out your bottom arm to help you balance. Slowly bring your top knee back so your thigh goes behind your trunk. Slowly lower your top leg toward the floor until you feel a gentle stretch on the outside of your left /  right hip and thigh. If you do not feel a stretch and your knee will not fall farther, place the heel of your other foot on top of your knee and pull your knee down toward the floor with your foot. Hold this position for __________ seconds. Repeat __________ times. Complete this exercise __________ times a day. Strengthening exercises These exercises build strength and endurance in your hip and pelvis. Endurance is the ability to use your muscles for a long time, even after they get tired. Straight leg raises, side-lying This exercise strengthens the muscles that rotate the leg at the hip and move it away from your body (hip abductors). Lie on your side with your left / right leg in the top position. Lie so your head, shoulder, hip, and knee line up. You may bend your bottom knee to help you balance. Roll your hips slightly forward so your hips are stacked directly over each other and your left / right knee is facing forward. Tense the muscles in your outer thigh and lift your top leg 4-6 inches (10-15 cm). Hold this position for __________ seconds. Slowly lower your leg to return to the starting position. Let your muscles relax completely before doing another repetition. Repeat __________ times. Complete this exercise __________ times a day. Leg raises, prone This exercise strengthens the muscles that move the hips backward (hip extensors). Lie on your abdomen (prone position) on your bed or a firm surface. You can put a pillow under your hips  if that is more comfortable for your lower back. Bend your left / right knee so your foot is straight up in the air. Squeeze your buttocks muscles and lift your left / right thigh off the bed. Do not let your back arch. Tense your thigh muscle as hard as you can without increasing any knee pain. Hold this position for __________ seconds. Slowly lower your leg to return to the starting position and allow it to relax completely. Repeat __________ times.  Complete this exercise __________ times a day. Hip hike Stand sideways on a bottom step. Stand on your left / right leg with your other foot unsupported next to the step. You can hold on to a railing or wall for balance if needed. Keep your knees straight and your torso square. Then lift your left / right hip up toward the ceiling. Slowly let your left / right hip lower toward the floor, past the starting position. Your foot should get closer to the floor. Do not lean or bend your knees. Repeat __________ times. Complete this exercise __________ times a day. This information is not intended to replace advice given to you by your health care provider. Make sure you discuss any questions you have with your health care provider. Document Revised: 02/22/2020 Document Reviewed: 02/22/2020 Elsevier Patient Education  [place].

## 2022-01-16 ENCOUNTER — Telehealth: Payer: Self-pay | Admitting: Adult Health

## 2022-01-16 LAB — CBC WITH DIFFERENTIAL/PLATELET
Absolute Monocytes: 538 cells/uL (ref 200–950)
Basophils Absolute: 50 cells/uL (ref 0–200)
Basophils Relative: 0.6 %
Eosinophils Absolute: 269 cells/uL (ref 15–500)
Eosinophils Relative: 3.2 %
HCT: 39.8 % (ref 35.0–45.0)
Hemoglobin: 13.3 g/dL (ref 11.7–15.5)
Lymphs Abs: 2293 cells/uL (ref 850–3900)
MCH: 31.3 pg (ref 27.0–33.0)
MCHC: 33.4 g/dL (ref 32.0–36.0)
MCV: 93.6 fL (ref 80.0–100.0)
MPV: 11 fL (ref 7.5–12.5)
Monocytes Relative: 6.4 %
Neutro Abs: 5250 cells/uL (ref 1500–7800)
Neutrophils Relative %: 62.5 %
Platelets: 291 10*3/uL (ref 140–400)
RBC: 4.25 10*6/uL (ref 3.80–5.10)
RDW: 12.4 % (ref 11.0–15.0)
Total Lymphocyte: 27.3 %
WBC: 8.4 10*3/uL (ref 3.8–10.8)

## 2022-01-16 LAB — COMPLETE METABOLIC PANEL WITH GFR
AG Ratio: 1.9 (calc) (ref 1.0–2.5)
ALT: 23 U/L (ref 6–29)
AST: 18 U/L (ref 10–35)
Albumin: 4.3 g/dL (ref 3.6–5.1)
Alkaline phosphatase (APISO): 53 U/L (ref 37–153)
BUN/Creatinine Ratio: 22 (calc) (ref 6–22)
BUN: 22 mg/dL (ref 7–25)
CO2: 30 mmol/L (ref 20–32)
Calcium: 9.6 mg/dL (ref 8.6–10.4)
Chloride: 108 mmol/L (ref 98–110)
Creat: 1.02 mg/dL — ABNORMAL HIGH (ref 0.60–1.00)
Globulin: 2.3 g/dL (calc) (ref 1.9–3.7)
Glucose, Bld: 100 mg/dL — ABNORMAL HIGH (ref 65–99)
Potassium: 4.6 mmol/L (ref 3.5–5.3)
Sodium: 141 mmol/L (ref 135–146)
Total Bilirubin: 0.2 mg/dL (ref 0.2–1.2)
Total Protein: 6.6 g/dL (ref 6.1–8.1)
eGFR: 57 mL/min/{1.73_m2} — ABNORMAL LOW (ref >=60)

## 2022-01-16 LAB — LIPID PANEL
Cholesterol: 103 mg/dL (ref <200)
HDL: 41 mg/dL — ABNORMAL LOW (ref >=50)
LDL Cholesterol (Calc): 32 mg/dL (calc)
Non-HDL Cholesterol (Calc): 62 mg/dL (calc) (ref <130)
Total CHOL/HDL Ratio: 2.5 (calc) (ref <5.0)
Triglycerides: 251 mg/dL — ABNORMAL HIGH (ref <150)

## 2022-01-16 LAB — TSH: TSH: 0.75 mIU/L (ref 0.40–4.50)

## 2022-01-16 LAB — HEPATITIS C ANTIBODY
Hepatitis C Ab: NONREACTIVE
SIGNAL TO CUT-OFF: 0.07 (ref <1.00)

## 2022-01-16 NOTE — Telephone Encounter (Signed)
Lvm for patient with instructions to call [facility], also mailed information to patient

## 2022-01-16 NOTE — Telephone Encounter (Signed)
Patient called  left message to ask:  when will her follow up CT Chest be scheduled. There is an order for High Res CT Chest & PFT due in May 2023, it  was ordered by Dr. Valeta Harms, should I refer patient to call [facility]?

## 2022-02-19 ENCOUNTER — Ambulatory Visit (INDEPENDENT_AMBULATORY_CARE_PROVIDER_SITE_OTHER): Payer: Medicare Other | Admitting: Adult Health

## 2022-02-19 ENCOUNTER — Encounter: Payer: Self-pay | Admitting: Adult Health

## 2022-02-19 ENCOUNTER — Other Ambulatory Visit: Payer: Self-pay

## 2022-02-19 VITALS — BP 138/78 | HR 86 | Temp 97.5°F | Wt 146.0 lb

## 2022-02-19 DIAGNOSIS — I2510 Atherosclerotic heart disease of native coronary artery without angina pectoris: Secondary | ICD-10-CM | POA: Diagnosis not present

## 2022-02-19 DIAGNOSIS — I10 Essential (primary) hypertension: Secondary | ICD-10-CM | POA: Diagnosis not present

## 2022-02-19 NOTE — Progress Notes (Signed)
Assessment and Plan:  Alison Campbell was seen today for hypertension.  Diagnoses and all orders for this visit:  Essential hypertension Rechecks in office are normal; recent history well controlled No other concerning sx Most likely home BP cuff inaccurate readings She is very reassured by this, declines lab checks She is welcome to schedule a NV to compare home cuff to manual in office for evaluation of accuracy She will follow up as scheduled or sooner if persistent/recurrent concerns   Further disposition pending results of labs. Discussed med's effects and SE's.   Over 15 minutes of exam, counseling, chart review, and critical decision making was performed.   Future Appointments  Date Time Provider [place]  04/18/2022 10:00 AM Magda Bernheim, NP [facility] None  05/07/2022  2:00 PM [facility] None  10/15/2022  2:00 PM [name], MD [facility] None  01/15/2023 11:00 AM Alison Comber, NP [facility] None    ------------------------------------------------------------------------------------------------------------------   HPI BP 136/78    Pulse 86    Temp (!) 97.5 F (36.4 C)    Wt 146 lb (66.2 kg)    SpO2 99%    BMI 25.86 kg/m  75 y.o.female with hx of htn, CKD 3, CAD presents for evaluation concerned about elevated BPs at home.   Recent years has been off of BPs meds with well controlled values (120s/70-80s on review). The patient doesn't typically check BPs at home, but does have a wrist cuff and checks occasionally, this AM had 169/84 initially, then 131/47 on recheck and requested evaulation.   Today their BP is BP: 136/78, 138/78 on manual recheck by provider.  She denies chest pain, shortness of breath, dizziness.  She had typical 2 cups of coffee this AM (has reduced), denies excess salt.  Does report lots of deaths in the family, has been more stressed.    Past Medical History:  Diagnosis Date   Anxiety    CKD (chronic kidney disease) stage  3, GFR 30-59 ml/min (HCC) 12/24/2020   Colon polyps    Costochondritis    GERD (gastroesophageal reflux disease)    coffee related   Hyperlipidemia    Insomnia    Osteoporosis    Pre-diabetes      Allergies  Allergen Reactions   Fish Oil Diarrhea    Current Outpatient Medications on File Prior to Visit  Medication Sig   aspirin 81 MG EC tablet Take 1 tablet daily   CALCIUM PO Take 2 tablets by mouth daily. 600 mg  2 per day   Cholecalciferol (VITAMIN D PO) Take 2,000 Units by mouth daily. Take 5 caps to = 10,000 units daily.   Cyanocobalamin (VITAMIN B 12 PO) Take 1 tablet by mouth daily.   ezetimibe (ZETIA) 10 MG tablet TAKE 1 TABLET BY MOUTH DAILY FOR CHOLESTEROL   meloxicam (MOBIC) 15 MG tablet TAKE 1 TABLET BY MOUTH DAILY WITH FOOD X2 WEEKS; MAY BE TAKEN WITH TYLENOL(NOT WITH ALEVE OR IBURPOFEN) THEN 1/2- 1 TABLET DAILY AS NEEDED   Multiple Vitamin (MULTIVITAMIN PO) Take 1 tablet by mouth daily.   Psyllium (METAMUCIL FIBER PO) Take by mouth daily.   rosuvastatin (CRESTOR) 20 MG tablet TAKE 1 TABLET(20 MG) BY MOUTH DAILY   traZODone (DESYREL) 150 MG tablet Take 1/3 tab (50 mg) to 1/2 tab (75 mg) to 1 tablet  1 hour  before Bedtime as needed for Sleep   chlorhexidine (PERIDEX) 0.12 % solution    No current facility-administered medications on file prior to visit.  Allergies:  Allergies  Allergen Reactions   Fish Oil Diarrhea   Surgical History:  She  has a past surgical history that includes hysterectomy; Polypectomy; Abdominal hysterectomy; Uvulectomy (N/A, 09/09/2016); and Colonoscopy (09/10/2011). Family History:  Herfamily history includes Bipolar disorder in her daughter; Cancer in her brother; Diabetes in her father; Heart attack in her father and mother; Heart disease in her father and mother. Social History:   reports that she has been smoking cigarettes. She started smoking about 57 years ago. She has a 28.50 pack-year smoking history. She has never used smokeless  tobacco. She reports that she does not drink alcohol and does not use drugs.   ROS: Review of Systems  Constitutional:  Negative for diaphoresis and malaise/fatigue.  HENT: Negative.    Eyes:  Negative for blurred vision and double vision.  Respiratory:  Negative for cough and shortness of breath.   Cardiovascular:  Negative for chest pain, palpitations, orthopnea, claudication, leg swelling and PND.  Gastrointestinal: Negative.   Neurological:  Negative for dizziness, weakness and headaches.  Psychiatric/Behavioral:  Negative for depression and substance abuse. The patient is not nervous/anxious.     Physical Exam:  BP 136/78    Pulse 86    Temp (!) 97.5 F (36.4 C)    Wt 146 lb (66.2 kg)    SpO2 99%    BMI 25.86 kg/m   General Appearance: Well nourished, in no apparent distress. Eyes: PERRLA, conjunctiva no swelling or erythema ENT/Mouth: mask in place; Hearing normal.  Neck: Supple, thyroid normal.  Respiratory: Respiratory effort normal, BS equal bilaterally without rales, rhonchi, wheezing or stridor.  Cardio: RRR with no MRGs. Brisk peripheral pulses without edema.  Abdomen: Soft, + BS.  Non tender, no guarding, rebound, hernias, masses. Lymphatics: Non tender without lymphadenopathy.  Musculoskeletal: no obvious deformity; normal gait.  Skin: Warm, dry without rashes, lesions, ecchymosis.  Neuro: Normal muscle tone Psych: Awake and oriented X 3, normal affect, Insight and Judgment appropriate.      Alison Ribas, NP 3:16 PM [facility]

## 2022-04-11 ENCOUNTER — Ambulatory Visit: Payer: Medicare Other | Admitting: Nurse Practitioner

## 2022-04-15 ENCOUNTER — Ambulatory Visit: Payer: Medicare Other | Admitting: Internal Medicine

## 2022-04-17 NOTE — Progress Notes (Signed)
Patient ID: Alison Campbell, female   DOB: 1946-03-19, 76 y.o.   MRN: [id number]  ? ? 3 month follow up ? ?Assessment:  ? ? ? ?Essential hypertension ?- continue medications, DASH diet, exercise and monitor at home. Call if greater than 140/80 consistently ?Go to the ER if any chest pain, shortness of breath, nausea, dizziness, severe HA, changes vision/speech   ?-     CBC with Differential/Platelet ?-     COMPLETE METABOLIC PANEL WITH GFR ?-     TSH ? ?Aortic atherosclerosis ([place]) - CT 06/2020 ?Control blood pressure, cholesterol, glucose, increase exercise.  ? ?3 Vessel CAD ?Per imaging; denies angina or dyspnea  ?Control blood pressure, cholesterol (LDL <70), bASA,  ?STOP SMOKING, increase exercise   ? ?Obstructive chronic bronchitis without exacerbation (HCC)/ Lung nodule ?Advised to stop smoking - 5 minutes spent on smoking cessation counseling ?Pt to have repeat CT 04/2022 and PFT ?Currently stable without respiratory medications ? ?Mixed hyperlipidemia ?check lipids ?LDL goal <70 ?decrease fatty foods ?increase activity. ?-     Lipid panel ? ?Abnormal glucose ?Discussed disease progression and risks ?Discussed diet/exercise, weight management and risk modification ? ?Medication management ?-     Magnesium ? ?Tobacco use disorder ?Smoking cessation-  instruction/counseling given, counseled patient on the dangers of tobacco use, advised patient to stop smoking, and reviewed strategies to maximize success, patient not ready to quit at this time.  ?- patient now follows with pulm for lung cancer screening  ? ?Vitamin D deficiency ?Continue supplement ? ?CKD 3 ([place]) ?Increase fluids, avoid frequent/chronic NSAIDS, monitor sugars, will monitor ?-CMP ? ?B12 Deficiency ?Has not taken supplement for approx 3 months ?- B12 ? ? ? ?Future Appointments  ?Date Time Provider [place]  ?05/07/2022  2:00 PM [facility]PFT [facility] None  ?05/09/2022 11:00 AM [facility] CT 1 [facility]  ?10/15/2022  2:00 PM [name], MD [facility] None  ?01/15/2023 11:00 AM [name], NP [facility] None  ? ? ? ? ? ? ?Subjective:  ? ?Alison Campbell is a 76 y.o. female who presents for Medicare Annual Wellness Visit and 3 month follow up. She has Diverticulosis of large intestine; History of colon polyps; Essential hypertension; Hyperlipidemia, mixed; Abnormal glucose; Chronic bronchitis ([place]); Vitamin D deficiency; Tobacco use disorder; Osteoporosis; Aortic atherosclerosis ([place]) by CT Lung Scan in July 2021; Insomnia; Dyshidrotic foot dermatitis; BMI 25.0-25.9,adult; CKD (chronic kidney disease) stage 3, GFR 30-59 ml/min (HCC); and 3-vessel CAD on their problem list. ? ?  ?She has hx of osteoporosis, last DEXA was 12/17/2016 Osteoporosis- - 2.7, has been on fosamax for several years, Recommend a break of Fosamax. ? ? ?She is a smoker since age 42, 40+ pack year hx, has been undergoing annual low dose lung CT for screening. 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 June Leap, planning PFTs and follow up CT. CT is scheduled for 05/09/22 ?she currently continues to smoke 0.5 pack a day; 5 minutes spent discussing risks associated with smoking and counseling on cessation, patient is not ready to quit.  ? ?She has insomnia, currently prescribed trazodone 150 mg . ? ?She has persistent left sole dyshidrotic dermatitis, didn't  resolve with steroid injections by derm, declined further interventions.  ? ?BMI is Body mass index is 25.4 kg/m?., she has been working on diet, active around her home and garden but no intentional exercise.  ?Wt Readings from  Last 3 Encounters:  ?04/18/22 143 lb 6.4 oz (65 kg)  ?02/19/22 146 lb (66.2 kg)  ?01/15/22 147 lb 12.8 oz (67 kg)  ? ?She had normal stress myoview 02/2018. CT 11/02/2021 showed 3 vessel CAD and known aortic atherosclerosis per CT 06/2020.  ?She is on statin and bASA.  ? ?Her blood pressure has been 140-150/70's, today their BP is BP: (!) 144/78  ?BP Readings from Last 3 Encounters:  ?04/18/22 (!) 144/78  ?02/19/22 138/78  ?01/15/22 124/70  ?She does not workout. She denies chest pain, shortness of breath, dizziness.  ? ? ?She is on cholesterol medication, rosuvastatin 20 mg daily and zetia 10 mg daily and denies myalgias. Her cholesterol is at goal. The cholesterol last visit was:   ?Lab Results  ?Component Value Date  ? CHOL 103 01/15/2022  ? HDL 41 (L) 01/15/2022  ? [place] 32 01/15/2022  ? TRIG 251 (H) 01/15/2022  ? CHOLHDL 2.5 01/15/2022  ? ?She has been working on diet and exercise for glucose management, and denies foot ulcerations, paresthesia of the feet and polydipsia. Last A1C in the office was:  ?Lab Results  ?Component Value Date  ? HGBA1C 5.3 10/15/2021  ? ?She has CKD IIIa since 2016, monitored at this office, she is drinking more water.  last GFR:  ?Lab Results  ?Component Value Date  ? EGFR 57 (L) 01/15/2022  ? EGFR 52 (L) 10/15/2021  ? ?Patient is on Vitamin D supplement. ?Lab Results  ?Component Value Date  ? VD25OH 58 10/15/2021  ? ?She is taking a b12 supplement, declines check today but agreeable at CPE.  ?No results found for: VITAMINB12 ? ? ? ?Medication Review ?Current Outpatient Medications on File Prior to Visit  ?Medication Sig Dispense Refill  ? aspirin 81 MG EC tablet Take 1 tablet daily    ? CALCIUM PO Take 2 tablets by mouth daily. 600 mg  2 per day    ? Cholecalciferol (VITAMIN D PO) Take 2,000 Units by mouth daily. Take 5 caps to = 10,000 units daily.    ? Cyanocobalamin (VITAMIN B 12 PO) Take 1 tablet by mouth daily.    ? ezetimibe (ZETIA) 10 MG tablet TAKE 1 TABLET BY MOUTH DAILY  FOR CHOLESTEROL 90 tablet 3  ? Multiple Vitamin (MULTIVITAMIN PO) Take 1 tablet by mouth daily.    ? Psyllium (METAMUCIL FIBER PO) Take by mouth daily.    ? rosuvastatin (CRESTOR) 20 MG tablet TAKE 1 TABLET(20 MG) BY MOUTH DAILY 90 tablet 0  ? traZODone (DESYREL) 150 MG tablet Take 1/3 tab (50 mg) to 1/2 tab (75 mg) to 1 tablet  1 hour  before Bedtime as needed for Sleep 90 tablet 3  ? meloxicam (MOBIC) 15 MG tablet TAKE 1 TABLET BY MOUTH DAILY WITH FOOD X2 WEEKS; MAY BE TAKEN WITH TYLENOL(NOT WITH ALEVE OR IBURPOFEN) THEN 1/2- 1 TABLET DAILY AS NEEDED (Patient not taking: Reported on 04/18/2022) 90 tablet 0  ? ?No current facility-administered medications on file prior to visit.  ? ?Allergies  ?Allergen Reactions  ? Fish Oil Diarrhea  ?  ? ?Current Problems (verified) ?Patient Active Problem List  ? Diagnosis Date Noted  ? 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/14/2022  ? BMI 25.0-25.9,adult 12/24/2020  ? CKD (chronic kidney disease)  stage 3, GFR 30-59 ml/min (HCC) 12/24/2020  ? Insomnia   ? Dyshidrotic foot dermatitis   ? Aortic atherosclerosis ([place]) by CT Lung Scan in July 2021 03/11/2018  ? Tobacco use disorder 06/21/2015  ? Osteoporosis 06/21/2015  ? Essential hypertension 11/07/2013  ? Hyperlipidemia, mixed 11/07/2013  ? Abnormal glucose 11/07/2013  ? Chronic bronchitis ([place]) 11/07/2013  ? Vitamin D deficiency 11/07/2013  ? Diverticulosis of large intestine 09/10/2011  ? History of colon polyps 09/10/2011  ? ? ? ? ?Patient Care Team: ?[name], MD as PCP - General (Internal Medicine) ?Milus Banister, MD as Attending Physician (Gastroenterology) ?Sable Feil, MD as Consulting Physician (Gastroenterology) ?Rozetta Nunnery, MD (Inactive) as Consulting Physician (Otolaryngology) ?Druscilla Brownie, MD as Consulting Physician (Dermatology) ?Garner Nash, DO as Consulting Physician (Pulmonary Disease) ? ? ?Allergies ?Allergies  ?Allergen Reactions  ? Fish Oil Diarrhea  ? ? ?SURGICAL HISTORY ?She  has a past  surgical history that includes hysterectomy; Polypectomy; Abdominal hysterectomy; Uvulectomy (N/A, 09/09/2016); and Colonoscopy (09/10/2011). ?FAMILY HISTORY ?Her family history includes Bipolar disorder in her

## 2022-04-18 ENCOUNTER — Encounter: Payer: Self-pay | Admitting: Nurse Practitioner

## 2022-04-18 ENCOUNTER — Ambulatory Visit (INDEPENDENT_AMBULATORY_CARE_PROVIDER_SITE_OTHER): Payer: Medicare Other | Admitting: Nurse Practitioner

## 2022-04-18 VITALS — BP 144/78 | HR 82 | Temp 97.9°F | Resp 16 | Ht 63.0 in | Wt 143.4 lb

## 2022-04-18 DIAGNOSIS — R7309 Other abnormal glucose: Secondary | ICD-10-CM | POA: Diagnosis not present

## 2022-04-18 DIAGNOSIS — R911 Solitary pulmonary nodule: Secondary | ICD-10-CM | POA: Diagnosis not present

## 2022-04-18 DIAGNOSIS — Z79899 Other long term (current) drug therapy: Secondary | ICD-10-CM

## 2022-04-18 DIAGNOSIS — E538 Deficiency of other specified B group vitamins: Secondary | ICD-10-CM | POA: Diagnosis not present

## 2022-04-18 DIAGNOSIS — I2510 Atherosclerotic heart disease of native coronary artery without angina pectoris: Secondary | ICD-10-CM | POA: Diagnosis not present

## 2022-04-18 DIAGNOSIS — J449 Chronic obstructive pulmonary disease, unspecified: Secondary | ICD-10-CM

## 2022-04-18 DIAGNOSIS — E559 Vitamin D deficiency, unspecified: Secondary | ICD-10-CM | POA: Diagnosis not present

## 2022-04-18 DIAGNOSIS — E782 Mixed hyperlipidemia: Secondary | ICD-10-CM | POA: Diagnosis not present

## 2022-04-18 DIAGNOSIS — F17200 Nicotine dependence, unspecified, uncomplicated: Secondary | ICD-10-CM | POA: Diagnosis not present

## 2022-04-18 DIAGNOSIS — I700 Atherosclerosis of aorta: Secondary | ICD-10-CM

## 2022-04-18 DIAGNOSIS — I10 Essential (primary) hypertension: Secondary | ICD-10-CM

## 2022-04-18 DIAGNOSIS — N1831 Chronic kidney disease, stage 3a: Secondary | ICD-10-CM | POA: Diagnosis not present

## 2022-04-19 LAB — CBC WITH DIFFERENTIAL/PLATELET
Absolute Monocytes: 594 cells/uL (ref 200–950)
Basophils Absolute: 55 cells/uL (ref 0–200)
Basophils Relative: 0.5 %
Eosinophils Absolute: 242 cells/uL (ref 15–500)
Eosinophils Relative: 2.2 %
HCT: 44.3 % (ref 35.0–45.0)
Hemoglobin: 14.7 g/dL (ref 11.7–15.5)
Lymphs Abs: 2255 cells/uL (ref 850–3900)
MCH: 31.2 pg (ref 27.0–33.0)
MCHC: 33.2 g/dL (ref 32.0–36.0)
MCV: 94.1 fL (ref 80.0–100.0)
MPV: 11.7 fL (ref 7.5–12.5)
Monocytes Relative: 5.4 %
Neutro Abs: 7854 cells/uL — ABNORMAL HIGH (ref 1500–7800)
Neutrophils Relative %: 71.4 %
Platelets: 261 10*3/uL (ref 140–400)
RBC: 4.71 10*6/uL (ref 3.80–5.10)
RDW: 12.2 % (ref 11.0–15.0)
Total Lymphocyte: 20.5 %
WBC: 11 10*3/uL — ABNORMAL HIGH (ref 3.8–10.8)

## 2022-04-19 LAB — COMPLETE METABOLIC PANEL WITH GFR
AG Ratio: 1.7 (calc) (ref 1.0–2.5)
ALT: 22 U/L (ref 6–29)
AST: 15 U/L (ref 10–35)
Albumin: 4.2 g/dL (ref 3.6–5.1)
Alkaline phosphatase (APISO): 52 U/L (ref 37–153)
BUN/Creatinine Ratio: 18 (calc) (ref 6–22)
BUN: 19 mg/dL (ref 7–25)
CO2: 28 mmol/L (ref 20–32)
Calcium: 9.6 mg/dL (ref 8.6–10.4)
Chloride: 107 mmol/L (ref 98–110)
Creat: 1.07 mg/dL — ABNORMAL HIGH (ref 0.60–1.00)
Globulin: 2.5 g/dL (calc) (ref 1.9–3.7)
Glucose, Bld: 88 mg/dL (ref 65–99)
Potassium: 4.3 mmol/L (ref 3.5–5.3)
Sodium: 141 mmol/L (ref 135–146)
Total Bilirubin: 0.3 mg/dL (ref 0.2–1.2)
Total Protein: 6.7 g/dL (ref 6.1–8.1)
eGFR: 53 mL/min/{1.73_m2} — ABNORMAL LOW (ref >=60)

## 2022-04-19 LAB — LIPID PANEL
Cholesterol: 108 mg/dL (ref <200)
HDL: 44 mg/dL — ABNORMAL LOW (ref >=50)
LDL Cholesterol (Calc): 36 mg/dL (calc)
Non-HDL Cholesterol (Calc): 64 mg/dL (calc) (ref <130)
Total CHOL/HDL Ratio: 2.5 (calc) (ref <5.0)
Triglycerides: 215 mg/dL — ABNORMAL HIGH (ref <150)

## 2022-04-19 LAB — HEMOGLOBIN A1C
Hgb A1c MFr Bld: 5.4 % of total Hgb (ref <5.7)
Mean Plasma Glucose: 108 mg/dL
eAG (mmol/L): 6 mmol/L

## 2022-04-19 LAB — VITAMIN B12: Vitamin B-12: 386 pg/mL (ref 200–1100)

## 2022-05-07 ENCOUNTER — Ambulatory Visit (INDEPENDENT_AMBULATORY_CARE_PROVIDER_SITE_OTHER): Payer: Medicare Other | Admitting: Pulmonary Disease

## 2022-05-07 DIAGNOSIS — R911 Solitary pulmonary nodule: Secondary | ICD-10-CM | POA: Diagnosis not present

## 2022-05-07 LAB — PULMONARY FUNCTION TEST
DL/VA % pred: 87 %
DL/VA: 3.6 ml/min/mmHg/L
DLCO cor % pred: 63 %
DLCO cor: 11.74 ml/min/mmHg
DLCO unc % pred: 66 %
DLCO unc: 12.18 ml/min/mmHg
FEF 25-75 Post: 1.36 L/sec
FEF 25-75 Pre: 1.84 L/sec
FEF2575-%Change-Post: -26 %
FEF2575-%Pred-Post: 90 %
FEF2575-%Pred-Pre: 122 %
FEV1-%Change-Post: -4 %
FEV1-%Pred-Post: 79 %
FEV1-%Pred-Pre: 83 %
FEV1-Post: 1.55 L
FEV1-Pre: 1.62 L
FEV1FVC-%Change-Post: -6 %
FEV1FVC-%Pred-Pre: 110 %
FEV6-%Change-Post: 2 %
FEV6-%Pred-Post: 81 %
FEV6-%Pred-Pre: 79 %
FEV6-Post: 2.02 L
FEV6-Pre: 1.97 L
FEV6FVC-%Pred-Post: 105 %
FEV6FVC-%Pred-Pre: 105 %
FVC-%Change-Post: 2 %
FVC-%Pred-Post: 77 %
FVC-%Pred-Pre: 75 %
FVC-Post: 2.02 L
FVC-Pre: 1.97 L
Post FEV1/FVC ratio: 77 %
Post FEV6/FVC ratio: 100 %
Pre FEV1/FVC ratio: 82 %
Pre FEV6/FVC Ratio: 100 %
RV % pred: 118 %
RV: 2.71 L
TLC % pred: 96 %
TLC: 4.74 L

## 2022-05-07 NOTE — Patient Instructions (Signed)
Performed Full PFT Today.   ?

## 2022-05-07 NOTE — Progress Notes (Signed)
Performed Full PFT Today.   ?

## 2022-05-09 ENCOUNTER — Ambulatory Visit
Admission: RE | Admit: 2022-05-09 | Discharge: 2022-05-09 | Disposition: A | Discharge status: Home / Self Care | Payer: Medicare Other | Source: Ambulatory Visit | Attending: Internal Medicine | Admitting: Internal Medicine

## 2022-05-09 DIAGNOSIS — I2510 Atherosclerotic heart disease of native coronary artery without angina pectoris: Secondary | ICD-10-CM | POA: Diagnosis not present

## 2022-05-09 DIAGNOSIS — J479 Bronchiectasis, uncomplicated: Secondary | ICD-10-CM | POA: Diagnosis not present

## 2022-05-09 DIAGNOSIS — R911 Solitary pulmonary nodule: Secondary | ICD-10-CM

## 2022-05-09 DIAGNOSIS — J439 Emphysema, unspecified: Secondary | ICD-10-CM | POA: Diagnosis not present

## 2022-05-09 DIAGNOSIS — J84112 Idiopathic pulmonary fibrosis: Secondary | ICD-10-CM | POA: Diagnosis not present

## 2022-05-29 ENCOUNTER — Telehealth: Payer: Self-pay | Admitting: Internal Medicine

## 2022-05-29 ENCOUNTER — Ambulatory Visit (INDEPENDENT_AMBULATORY_CARE_PROVIDER_SITE_OTHER): Payer: Medicare Other | Admitting: Pulmonary Disease

## 2022-05-29 ENCOUNTER — Encounter: Payer: Self-pay | Admitting: Pulmonary Disease

## 2022-05-29 VITALS — BP 146/76 | HR 72 | Temp 98.3°F | Ht 63.0 in | Wt 142.2 lb

## 2022-05-29 DIAGNOSIS — J849 Interstitial pulmonary disease, unspecified: Secondary | ICD-10-CM

## 2022-05-29 DIAGNOSIS — R0602 Shortness of breath: Secondary | ICD-10-CM

## 2022-05-29 DIAGNOSIS — F17200 Nicotine dependence, unspecified, uncomplicated: Secondary | ICD-10-CM | POA: Diagnosis not present

## 2022-05-29 DIAGNOSIS — R911 Solitary pulmonary nodule: Secondary | ICD-10-CM

## 2022-05-29 NOTE — Progress Notes (Signed)
Synopsis: Referred in November 2022 for lung nodule by [name], MD  Subjective:   PATIENT ID: Alison Campbell GENDER: female DOB: 09-Apr-1946, MRN: [id number]  Chief Complaint  Patient presents with   Follow-up    Follow up. Patient says she's been coughing up a lot of phlegm.     This is a 76 year old female, past medical history of gastroesophageal reflux, CKD, hyperlipidemia.Patient had a lung cancer screening CT on 11/02/2021.  Lung cancer screening CT revealed multiple small pulmonary nodules.  There was a new pleural-based nodule in the right upper lobe.  With a mean dry volume of 5.3 mm in diameter.  No other significant suspicious lesions.  Also has associated centrilobular and paraseptal emphysema.  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  Patient was referred to pulmonary for evaluation.  OV 11/13/2021: Here today to discuss abnormal CT imaging.  We also had a long discussion regarding smoking cessation.  Please see separate documentation regarding smoking cessation.  Patient has smoked since age 28.  She quit for 9 years however has had approximately 40+ pack years smoking at around half to 1 full pack per day.  OV 05/29/2022: Here today for follow-up recent CT scan.CT scan of the chest was completed on 05/09/2022 this was in follow-up of a right upper lobe pulmonary nodule.  Findings felt to be consistent with infection or inflammatory lesion on CT.  There is unchanged mild pulmonary fibrosis with apical to basal gradient irregular peripheral interstitial opacities groundglass and varicoid bronchiectasis without any evidence of honeycombing.  Findings on the CT were suggestive of an alternate diagnosis besides UIP.  From a respiratory standpoint she is doing okay.  She is not very active so she is not seeing much difference in her shortness of breath related to exertion.   Past Medical History:  Diagnosis Date    Anxiety    CKD (chronic kidney disease) stage 3, GFR 30-59 ml/min (HCC) 12/24/2020   Colon polyps    Costochondritis    GERD (gastroesophageal reflux disease)    coffee related   Hyperlipidemia    Insomnia    Osteoporosis    Pre-diabetes      Family History  Problem Relation Age of Onset   Heart disease Mother    Heart attack Mother    Heart disease Father    Diabetes Father    Heart attack Father    Cancer Brother        Lung   Bipolar disorder Daughter    Colon cancer Neg Hx    Esophageal cancer Neg Hx    Stomach cancer Neg Hx    Rectal cancer Neg Hx      Past Surgical History:  Procedure Laterality Date   ABDOMINAL HYSTERECTOMY     COLONOSCOPY  09/10/2011   hysterectomy     POLYPECTOMY     UVULECTOMY N/A 09/09/2016   Procedure: PARTIAL UVULECTOMY, direct laryngoscopy;  Surgeon: Rozetta Nunnery, MD;  Location: [facility];  Service: ENT;  Laterality: N/A;  PARTIAL UVULECTOMY, direct laryngoscopy    Social History   Socioeconomic History   Marital status: Widowed    Spouse name: Not on file   Number of children: Not on file   Years of education: Not on file   Highest education level: Not on file  Occupational History   Not on file  Tobacco Use   Smoking status: Every Day  Packs/day: 0.50    Years: 57.00    Pack years: 28.50    Types: Cigarettes    Start date: 1966   Smokeless tobacco: Never  Vaping Use   Vaping Use: Never used  Substance and Sexual Activity   Alcohol use: No    Alcohol/week: 0.0 standard drinks   Drug use: No   Sexual activity: Not Currently  Other Topics Concern   Not on file  Social History Narrative   Not on file   Social Determinants of Health   Financial Resource Strain: Not on file  Food Insecurity: Not on file  Transportation Needs: Not on file  Physical Activity: Not on file  Stress: Not on file  Social Connections: Not on file  Intimate Partner Violence: Not on file     Allergies  Allergen  Reactions   Fish Oil Diarrhea     Outpatient Medications Prior to Visit  Medication Sig Dispense Refill   aspirin 81 MG EC tablet Take 1 tablet daily     CALCIUM PO Take 2 tablets by mouth daily. 600 mg  2 per day     Cholecalciferol (VITAMIN D PO) Take 2,000 Units by mouth daily. Take 5 caps to = 10,000 units daily.     Cyanocobalamin (VITAMIN B 12 PO) Take 1 tablet by mouth daily.     ezetimibe (ZETIA) 10 MG tablet TAKE 1 TABLET BY MOUTH DAILY FOR CHOLESTEROL 90 tablet 3   Multiple Vitamin (MULTIVITAMIN PO) Take 1 tablet by mouth daily.     Psyllium (METAMUCIL FIBER PO) Take by mouth daily.     rosuvastatin (CRESTOR) 20 MG tablet TAKE 1 TABLET(20 MG) BY MOUTH DAILY 90 tablet 0   traZODone (DESYREL) 150 MG tablet Take 1/3 tab (50 mg) to 1/2 tab (75 mg) to 1 tablet  1 hour  before Bedtime as needed for Sleep 90 tablet 3   meloxicam (MOBIC) 15 MG tablet TAKE 1 TABLET BY MOUTH DAILY WITH FOOD X2 WEEKS; MAY BE TAKEN WITH TYLENOL(NOT WITH ALEVE OR IBURPOFEN) THEN 1/2- 1 TABLET DAILY AS NEEDED (Patient not taking: Reported on 04/18/2022) 90 tablet 0   No facility-administered medications prior to visit.    Review of Systems  Constitutional:  Negative for chills, fever, malaise/fatigue and weight loss.  HENT:  Negative for hearing loss, sore throat and tinnitus.   Eyes:  Negative for blurred vision and double vision.  Respiratory:  Positive for shortness of breath. Negative for cough, hemoptysis, sputum production, wheezing and stridor.   Cardiovascular:  Negative for chest pain, palpitations, orthopnea, leg swelling and PND.  Gastrointestinal:  Negative for abdominal pain, constipation, diarrhea, heartburn, nausea and vomiting.  Genitourinary:  Negative for dysuria, hematuria and urgency.  Musculoskeletal:  Negative for joint pain and myalgias.  Skin:  Negative for itching and rash.  Neurological:  Negative for dizziness, tingling, weakness and headaches.  Endo/Heme/Allergies:  Negative for  environmental allergies. Does not bruise/bleed easily.  Psychiatric/Behavioral:  Negative for depression. The patient is not nervous/anxious and does not have insomnia.   All other systems reviewed and are negative.   Objective:  Physical Exam Vitals reviewed.  Constitutional:      General: She is not in acute distress.    Appearance: She is well-developed.  HENT:     Head: Normocephalic and atraumatic.  Eyes:     General: No scleral icterus.    Conjunctiva/sclera: Conjunctivae normal.     Pupils: Pupils are equal, round, and reactive to light.  Neck:  Vascular: No JVD.     Trachea: No tracheal deviation.  Cardiovascular:     Rate and Rhythm: Normal rate and regular rhythm.     Heart sounds: Normal heart sounds. No murmur heard. Pulmonary:     Effort: Pulmonary effort is normal. No tachypnea, accessory muscle usage or respiratory distress.     Breath sounds: No stridor. No wheezing, rhonchi or rales.     Comments: Inspiratory crackles bilaterally Abdominal:     General: There is no distension.     Palpations: Abdomen is soft.     Tenderness: There is no abdominal tenderness.  Musculoskeletal:        General: No tenderness.     Cervical back: Neck supple.  Lymphadenopathy:     Cervical: No cervical adenopathy.  Skin:    General: Skin is warm and dry.     Capillary Refill: Capillary refill takes less than 2 seconds.     Findings: No rash.  Neurological:     Mental Status: She is alert and oriented to person, place, and time.  Psychiatric:        Behavior: Behavior normal.     Vitals:   05/29/22 1329  BP: (!) 146/76  Pulse: 72  Temp: 98.3 F (36.8 C)  TempSrc: Oral  SpO2: 95%  Weight: 142 lb 3.2 oz (64.5 kg)  Height: '5\' 3"'$  (1.6 m)   95% on RA BMI Readings from Last 3 Encounters:  05/29/22 25.19 kg/m  04/18/22 25.40 kg/m  02/19/22 25.86 kg/m   Wt Readings from Last 3 Encounters:  05/29/22 142 lb 3.2 oz (64.5 kg)  04/18/22 143 lb 6.4 oz (65 kg)   02/19/22 146 lb (66.2 kg)     CBC    Component Value Date/Time   WBC 11.0 (H) 04/18/2022 1017   RBC 4.71 04/18/2022 1017   HGB 14.7 04/18/2022 1017   HCT 44.3 04/18/2022 1017   PLT 261 04/18/2022 1017   MCV 94.1 04/18/2022 1017   MCH 31.2 04/18/2022 1017   MCHC 33.2 04/18/2022 1017   RDW 12.2 04/18/2022 1017   LYMPHSABS 2,255 04/18/2022 1017   MONOABS 516 03/23/2017 1053   EOSABS 242 04/18/2022 1017   BASOSABS 55 04/18/2022 1017    Chest Imaging: November 2022 lung cancer screening CT: Lung RADS 3, multiple small pulmonary nodules, right upper lobe pulmonary nodule 5.3 mm with associated centrilobular and paraseptal emphysema. The patient's images have been independently reviewed by me.    May 2023 CT chest: Apical basilar gradient, areas of pulmonary fibrosis no frank honeycombing. The patient's images have been independently reviewed by me.     Pulmonary Functions Testing Results:    Latest Ref Rng & Units 05/07/2022    1:36 PM  PFT Results  FVC-Pre L 1.97    FVC-Predicted Pre % 75    FVC-Post L 2.02    FVC-Predicted Post % 77    Pre FEV1/FVC % % 82    Post FEV1/FCV % % 77    FEV1-Pre L 1.62    FEV1-Predicted Pre % 83    FEV1-Post L 1.55    DLCO uncorrected ml/min/mmHg 12.18    DLCO UNC% % 66    DLCO corrected ml/min/mmHg 11.74    DLCO COR %Predicted % 63    DLVA Predicted % 87    TLC L 4.74    TLC % Predicted % 96    RV % Predicted % 118     FeNO:   Pathology:   Echocardiogram:  Heart Catheterization:     Assessment & Plan:     ICD-10-CM   1. Right upper lobe pulmonary nodule  R91.1     2. ILD (interstitial lung disease) ([facility])  J84.9     3. Tobacco use disorder  F17.200        Discussion:  This is a 76 year old female, longstanding history of tobacco use, interstitial lung disease on CT imaging, abnormal lung cancer screening CT.  She had a lung cancer screening CT with probable UIP, follow-up HRCT that was felt to be not consistent  with UIP however evidence of pulmonary fibrosis.  She has a longstanding history of smoking.  Nodules are stable.  Plan: We will get her an appointment to see Dr. Chase Caller in ILD clinic. ILD questionnaire given to patient today. Recommend to continue smoking cessation. She will need a repeat CT scan in 1 year. I will let Dr. Chase Caller order this in case he wants to follow-up on her ILD as well.   Current Outpatient Medications:    aspirin 81 MG EC tablet, Take 1 tablet daily, Disp: , Rfl:    CALCIUM PO, Take 2 tablets by mouth daily. 600 mg  2 per day, Disp: , Rfl:    Cholecalciferol (VITAMIN D PO), Take 2,000 Units by mouth daily. Take 5 caps to = 10,000 units daily., Disp: , Rfl:    Cyanocobalamin (VITAMIN B 12 PO), Take 1 tablet by mouth daily., Disp: , Rfl:    ezetimibe (ZETIA) 10 MG tablet, TAKE 1 TABLET BY MOUTH DAILY FOR CHOLESTEROL, Disp: 90 tablet, Rfl: 3   Multiple Vitamin (MULTIVITAMIN PO), Take 1 tablet by mouth daily., Disp: , Rfl:    Psyllium (METAMUCIL FIBER PO), Take by mouth daily., Disp: , Rfl:    rosuvastatin (CRESTOR) 20 MG tablet, TAKE 1 TABLET(20 MG) BY MOUTH DAILY, Disp: 90 tablet, Rfl: 0   traZODone (DESYREL) 150 MG tablet, Take 1/3 tab (50 mg) to 1/2 tab (75 mg) to 1 tablet  1 hour  before Bedtime as needed for Sleep, Disp: 90 tablet, Rfl: 3   meloxicam (MOBIC) 15 MG tablet, TAKE 1 TABLET BY MOUTH DAILY WITH FOOD X2 WEEKS; MAY BE TAKEN WITH TYLENOL(NOT WITH ALEVE OR IBURPOFEN) THEN 1/2- 1 TABLET DAILY AS NEEDED (Patient not taking: Reported on 04/18/2022), Disp: 90 tablet, Rfl: 0   Garner Nash, DO [facility] 05/29/2022 1:50 PM

## 2022-05-29 NOTE — Patient Instructions (Addendum)
Thank you for visiting Dr. Valeta Harms at [facility]. Today we recommend the following:  ILD Questionnaire  Return if symptoms worsen or fail to improve, for New Appt Consult ILD Dr. Chase Caller .    Please do your part to reduce the spread of COVID-19.

## 2022-05-29 NOTE — Telephone Encounter (Addendum)
Seeing her 07/03/22. Referral from [facility] - get her to do ILD packet    - Serum: ESR, ACE, ANA, DS-DNA, RF, anti-CCP, , Total CK,  Aldolase,  ENP Panel ( ensure includes -> scl-70, ssA, ssB, anti-RNP, anti-JO-1, anti-smith antibody, anti-centromere, anti-histone) , Myositis Panel (,)  & Hypersensitivity Pneumonitis Panel, Quantiferon Gold and BNP      Latest Ref Rng & Units 05/07/2022    1:36 PM  PFT Results  FVC-Pre L 1.97    FVC-Predicted Pre % 75    FVC-Post L 2.02    FVC-Predicted Post % 77    Pre FEV1/FVC % % 82    Post FEV1/FCV % % 77    FEV1-Pre L 1.62    FEV1-Predicted Pre % 83    FEV1-Post L 1.55    DLCO uncorrected ml/min/mmHg 12.18    DLCO UNC% % 66    DLCO corrected ml/min/mmHg 11.74    DLCO COR %Predicted % 63    DLVA Predicted % 87    TLC L 4.74    TLC % Predicted % 96    RV % Predicted % 118

## 2022-06-02 NOTE — Telephone Encounter (Signed)
Called and spoke with pt letting her know the info per MR and she verbalized understanding. Orders for labs have been placed. Pt already has ILD packet. Nothing further needed.

## 2022-06-05 DIAGNOSIS — R350 Frequency of micturition: Secondary | ICD-10-CM | POA: Diagnosis not present

## 2022-06-05 DIAGNOSIS — N3001 Acute cystitis with hematuria: Secondary | ICD-10-CM | POA: Diagnosis not present

## 2022-06-05 DIAGNOSIS — R300 Dysuria: Secondary | ICD-10-CM | POA: Diagnosis not present

## 2022-06-06 ENCOUNTER — Other Ambulatory Visit (INDEPENDENT_AMBULATORY_CARE_PROVIDER_SITE_OTHER): Payer: Medicare Other

## 2022-06-06 DIAGNOSIS — R0602 Shortness of breath: Secondary | ICD-10-CM

## 2022-06-06 DIAGNOSIS — J849 Interstitial pulmonary disease, unspecified: Secondary | ICD-10-CM | POA: Diagnosis not present

## 2022-06-06 LAB — BRAIN NATRIURETIC PEPTIDE: Pro B Natriuretic peptide (BNP): 34 pg/mL (ref 0.0–100.0)

## 2022-06-10 LAB — ANTI-NUCLEAR AB-TITER (ANA TITER): ANA Titer 1: 1:80 {titer} — ABNORMAL HIGH

## 2022-06-10 LAB — ANTI-SCLERODERMA ANTIBODY: Scleroderma (Scl-70) (ENA) Antibody, IgG: <1 AI

## 2022-06-10 LAB — ANGIOTENSIN CONVERTING ENZYME: Angiotensin-Converting Enzyme: 36 U/L (ref 9–67)

## 2022-06-10 LAB — CYCLIC CITRUL PEPTIDE ANTIBODY, IGG: Cyclic Citrullin Peptide Ab: <16 UNITS

## 2022-06-10 LAB — ANTI-DNA ANTIBODY, DOUBLE-STRANDED: ds DNA Ab: <1 IU/mL

## 2022-06-10 LAB — HISTONE ANTIBODIES, IGG, BLOOD: Histone Antibodies: <1 U (ref <1.0)

## 2022-06-10 LAB — RHEUMATOID FACTOR: Rheumatoid fact SerPl-aCnc: <14 IU/mL (ref <14)

## 2022-06-10 LAB — ANA: Anti Nuclear Antibody (ANA): POSITIVE — AB

## 2022-06-10 LAB — CENTROMERE ANTIBODIES: Centromere Ab Screen: <1 AI

## 2022-06-11 LAB — MYOSITIS SPECIFIC II ANTIBODIES PANEL
EJ AB: <11 SI (ref <11)
JO-1 AB: <11 SI (ref <11)
MDA-5 AB: <11 SI (ref <11)
MI-2 ALPHA AB: <11 SI (ref <11)
MI-2 BETA AB: <11 SI (ref <11)
NXP-2 AB: <11 SI (ref <11)
OJ AB: <11 SI (ref <11)
PL-12 AB: <11 SI (ref <11)
PL-7 AB: <11 SI (ref <11)
SRP-AB: <11 SI (ref <11)
TIF-1y AB: <11 SI (ref <11)

## 2022-06-11 LAB — CK TOTAL AND CKMB (NOT AT ARMC)
CK, MB: <0.7 ng/mL (ref 0–5.0)
Total CK: 35 U/L (ref 29–143)

## 2022-06-11 LAB — ANTI-SMITH ANTIBODY: ENA SM Ab Ser-aCnc: <1 AI

## 2022-06-11 LAB — ALDOLASE: Aldolase: 4.3 U/L (ref <=8.1)

## 2022-06-12 LAB — QUANTIFERON-TB GOLD PLUS
Mitogen-NIL: 5.83 IU/mL
NIL: 0.04 IU/mL
QuantiFERON-TB Gold Plus: NEGATIVE
TB1-NIL: <0 IU/mL
TB2-NIL: <0 IU/mL

## 2022-06-14 LAB — HYPERSENSITIVITY PNEUMONITIS
A. Pullulans Abs: NEGATIVE
A.Fumigatus #1 Abs: NEGATIVE
Micropolyspora faeni, IgG: NEGATIVE
Pigeon Serum Abs: NEGATIVE
Thermoact. Saccharii: NEGATIVE
Thermoactinomyces vulgaris, IgG: NEGATIVE

## 2022-06-14 LAB — RNP ANTIBODIES: ENA RNP Ab: <0.2 AI (ref 0.0–0.9)

## 2022-06-14 LAB — ANTI-JO 1 ANTIBODY, IGG: Anti JO-1: <0.2 AI (ref 0.0–0.9)

## 2022-06-25 LAB — MYOMARKER 3 PLUS PROFILE (RDL)

## 2022-06-28 ENCOUNTER — Other Ambulatory Visit: Payer: Self-pay | Admitting: Cardiology

## 2022-07-03 ENCOUNTER — Ambulatory Visit (INDEPENDENT_AMBULATORY_CARE_PROVIDER_SITE_OTHER): Payer: Medicare Other | Admitting: Internal Medicine

## 2022-07-03 ENCOUNTER — Encounter: Payer: Self-pay | Admitting: Internal Medicine

## 2022-07-03 VITALS — BP 120/70 | HR 83 | Temp 98.1°F | Ht 63.0 in | Wt 141.0 lb

## 2022-07-03 DIAGNOSIS — I2510 Atherosclerotic heart disease of native coronary artery without angina pectoris: Secondary | ICD-10-CM | POA: Diagnosis not present

## 2022-07-03 DIAGNOSIS — F17200 Nicotine dependence, unspecified, uncomplicated: Secondary | ICD-10-CM

## 2022-07-03 DIAGNOSIS — J849 Interstitial pulmonary disease, unspecified: Secondary | ICD-10-CM | POA: Diagnosis not present

## 2022-07-03 DIAGNOSIS — J439 Emphysema, unspecified: Secondary | ICD-10-CM | POA: Diagnosis not present

## 2022-07-03 MED ORDER — SPIRIVA RESPIMAT 2.5 MCG/ACT IN AERS: 2.0000 | INHALATION_SPRAY | Freq: Every day | RESPIRATORY_TRACT | 5 refills | Status: DC

## 2022-07-03 NOTE — Progress Notes (Signed)
Synopsis: Referred in November 2022 for lung nodule by [name], MD  Subjective:   PATIENT ID: Alison Campbell GENDER: female DOB: 1946-06-22, MRN: [id number]  Chief Complaint  Patient presents with   Follow-up    Follow up. Patient says she's been coughing up a lot of phlegm.     This is a 76 year old female, past medical history of gastroesophageal reflux, CKD, hyperlipidemia.Patient had a lung cancer screening CT on 11/02/2021.  Lung cancer screening CT revealed multiple small pulmonary nodules.  There was a new pleural-based nodule in the right upper lobe.  With a mean dry volume of 5.3 mm in diameter.  No other significant suspicious lesions.  Also has associated centrilobular and paraseptal emphysema.  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  Patient was referred to pulmonary for evaluation.  OV 11/13/2021: Here today to discuss abnormal CT imaging.  We also had a long discussion regarding smoking cessation.  Please see separate documentation regarding smoking cessation.  Patient has smoked since age 51.  She quit for 9 years however has had approximately 40+ pack years smoking at around half to 1 full pack per day.  OV 05/29/2022: Here today for follow-up recent CT scan.CT scan of the chest was completed on 05/09/2022 this was in follow-up of a right upper lobe pulmonary nodule.  Findings felt to be consistent with infection or inflammatory lesion on CT.  There is unchanged mild pulmonary fibrosis with apical to basal gradient irregular peripheral interstitial opacities groundglass and varicoid bronchiectasis without any evidence of honeycombing.  Findings on the CT were suggestive of an alternate diagnosis besides UIP.  From a respiratory standpoint she is doing okay.  She is not very active so she is not seeing much difference in her shortness of breath related to exertion.   OV 07/03/2022  Subjective:  Patient  ID: Alison Campbell, female , DOB: Apr 10, 1946 , age 57 y.o. , MRN: [id number] , ADDRESS: [place] [id number] PCP [name], MD Patient Care Team: [name], MD as PCP - General (Internal Medicine) Milus Banister, MD as Attending Physician (Gastroenterology) Sable Feil, MD as Consulting Physician (Gastroenterology) Rozetta Nunnery, MD (Inactive) as Consulting Physician (Otolaryngology) Druscilla Brownie, MD as Consulting Physician (Dermatology) Garner Nash, DO as Consulting Physician (Pulmonary Disease)  This Provider for this visit: Treatment Team:  Attending Provider: Brand Males, MD    07/03/2022 -transfer of care from Dr. Leory Plowman Icard to the pulm fibrosis center Dr. Chase Caller Chief Complaint  Patient presents with   Follow-up    Pt states she has been doing okay since last visit and denies any complaints.     HPI Alison Campbell 76 y.o. -pleasant female has been getting annual low-dose CT scans since 2018 annually.  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  Therefore she has been referred here.  According to the patient she is quite asymptomatic.  She barely has any symptoms and she feels fine.  She did have some cough in November 2022 and this is actually cleared up.  Current symptom severity is listed below   [facility] Integrated Comprehensive ILD Questionnaire  Symptoms:  Very mild SYMPTOM SCALE - ILD 07/03/2022  Current weight   O2 use ra  Shortness of Breath 0 -> 5 scale with 5  being worst (score 6 If unable to do)  At rest 0  Simple tasks - showers, clothes change, eating, shaving 1  Household (dishes, doing bed, laundry) 0  Shopping 0  Walking level at own pace 0  Walking up Stairs 0  Total (30-36) Dyspnea Score 1      Non-dyspnea symptoms (0-> 5 scale) 07/03/2022  How bad is your cough? 1   How bad is your fatigue 2  How bad is nausea 2  How bad is vomiting?  0  How bad is diarrhea? 0  How bad is anxiety? 0  How bad is depression 0  Any chronic pain - if so where and how bad 0     Past Medical History :  -She believes she might have some COPD.  Denies any asthma connective tissue disease.  Denies any stroke seizures hepatitis pulmonary hypertension.  Denies kidney disease.  Denies heart disease denies pleurisy. -She had COVID-vaccine but not the COVID   ROS:  8 pound intentional weight loss but otherwise negative  FAMILY HISTORY of LUNG DISEASE:  -She has an interesting family history.  Her maternal grandmother was the cousin of any Ermalinda Barrios at the shop [employer].  Her father was himself born in 40.  She was born in [birth year].  There is no family history of any lung disease.  PERSONAL EXPOSURE HISTORY:  -She started smoking in 1966.  She still smokes cigarettes.  In between she quit for 9 years.  No cigar smoking.  No marijuana smoking no cocaine no intravenous drug use  HOME  EXPOSURE and HOBBY DETAILS :  Single-family home for the last 29 years.  Age of the home is 35 years.  She does some gardening but otherwise detailed organic antigen exposure history in the house is negative  OCCUPATIONAL HISTORY (122 questions) : She is done some foot care work but otherwise detailed organic and inorganic antigen exposure history is negative  PULMONARY TOXICITY HISTORY (27 items):  She has been on prednisone in the past  INVESTIGATIONS:  Personally visualized all the CT scans and showed it to her" agree with current thinking of alternative to UIP diagnosis.  annual LDCT 2018, 2019, 2020 and 2021 July and 2022 November . ILD first repored in July 2021   and this the LDCT reported ILD as "progressive" though prior scan no mention of ILD. In my professional opinion: some ILD was there in 2019:. HRCT then done Nov 2022 - reported as prob UI and the May 2023 HRCT reported as  altermnative dx. AGree with alternative diagnsos    CT Chest data - HRCT May 2023  Narrative & Impression  CLINICAL DATA:  Follow-up right upper lobe pulmonary nodule   EXAM: CT CHEST WITHOUT CONTRAST   TECHNIQUE: Multidetector CT imaging of the chest was performed following the standard protocol without intravenous contrast. High resolution imaging of the lungs, as well as inspiratory and expiratory imaging, was performed.   RADIATION DOSE REDUCTION: This exam was performed according to the departmental dose-optimization program which includes automated exposure control, adjustment of the mA and/or kV according to patient size and/or use of iterative reconstruction technique.   COMPARISON:  11/02/2021   FINDINGS: Cardiovascular: Aortic atherosclerosis. Normal heart size. Left and right coronary artery calcifications. No pericardial effusion.   Mediastinum/Nodes: No enlarged mediastinal, hilar, or axillary lymph nodes. Thyroid gland, trachea, and esophagus demonstrate no significant findings.   Lungs/Pleura: Minimal paraseptal emphysema. Subpleural nodule of the anterior right upper lobe new on prior  examination is diminished in fullness and conspicuity, with an irregular remnant in this vicinity (series 13, image 109). Additional small pulmonary nodules are unchanged. Unchanged mild pulmonary fibrosis in a pattern with apical to basal gradient featuring irregular peripheral interstitial opacity, ground-glass, and varicoid bronchiectasis without evidence of subpleural bronchiolectasis or honeycombing. No significant air trapping on expiratory phase imaging. No pleural effusion or pneumothorax.   Upper Abdomen: No acute abnormality.   Musculoskeletal: No chest wall abnormality. No suspicious osseous lesions identified.   IMPRESSION: 1. Subpleural nodule of the anterior right upper lobe new on prior examination is diminished in fullness and conspicuity, with  an irregular remnant in this vicinity. Findings are consistent nonspecific sequelae of infection or inflammation, for which no further specific follow-up is required. Additional small pulmonary nodules are unchanged. Lung RADS category 2, benign appearance or behavior. Consider ongoing low-dose CT lung cancer screening if indicated by patient age, smoking history, and/or other risk factors for lung cancer. 2. Unchanged mild pulmonary fibrosis in a pattern with apical to basal gradient featuring irregular peripheral interstitial opacity, ground-glass, and varicoid bronchiectasis without evidence of subpleural bronchiolectasis or honeycombing. Findings are suggestive of an alternative diagnosis (not UIP) per consensus guidelines: Diagnosis of Idiopathic Pulmonary Fibrosis: An Official ATS/ERS/JRS/ALAT Clinical Practice Guideline. [place], Iss 5, [phone], Aug 29 2017. 3. Minimal emphysema. 4. Coronary artery disease.   Aortic Atherosclerosis (ICD10-I70.0) and Emphysema (ICD10-J43.9).     Electronically Signed   By: Delanna Ahmadi M.D.   On: 05/12/2022 15:42     Simple office walk 185 feet x  3 laps goal with forehead probe 07/03/2022    O2 used ra   Number laps completed 3   Comments about pace avg   Resting Pulse Ox/HR 98% and 69/min   Final Pulse Ox/HR 96% and 96/min   Desaturated </= 88% no   Desaturated <= 3% points no   Got Tachycardic >/= 90/min yes   Symptoms at end of test No complaints   Miscellaneous comments x       PFT     Latest Ref Rng & Units 05/07/2022    1:36 PM  PFT Results  FVC-Pre L 1.97   FVC-Predicted Pre % 75   FVC-Post L 2.02   FVC-Predicted Post % 77   Pre FEV1/FVC % % 82   Post FEV1/FCV % % 77   FEV1-Pre L 1.62   FEV1-Predicted Pre % 83   FEV1-Post L 1.55   DLCO uncorrected ml/min/mmHg 12.18   DLCO UNC% % 66   DLCO corrected ml/min/mmHg 11.74   DLCO COR %Predicted % 63   DLVA Predicted % 87   TLC L 4.74    TLC % Predicted % 96   RV % Predicted % 118      Latest Reference Range & Units 06/06/22 14:37  ANA Pattern 1  Nuclear, Homogeneous !  ANA Titer 1 titer 1:80 (H)  Angiotensin-Converting Enzyme 9 - 67 U/L 36  Anti JO-1 0.0 - 0.9 AI <0.2  CENTROMERE AB SCREEN <1.0 NEG AI <1.5 NEG  Cyclic Citrullin Peptide Ab UNITS <16  ds DNA Ab IU/mL <1  ENA RNP Ab 0.0 - 0.9 AI <0.2  RA Latex Turbid. <14 IU/mL <14  ENA SM Ab Ser-aCnc <1.0 NEG AI <1.0 NEG  Anti-Jo-1 Ab (RDL) <20 Units <20  Anti-PL-7 Ab (RDL) Negative  Negative  Anti-PL-12 Ab (RDL) Negative  Negative  Anti-EJ Ab (RDL) Negative  Negative  Anti-OJ Ab (  RDL) Negative  Negative  Anti-SRP Ab (RDL) Negative  Negative  Anti-Mi-2 Ab (RDL) Negative  Negative  Anti-TIF-1gamma Ab (RDL) <20 Units <20  Anti-MDA-5 Ab (CADM-140)(RDL) <20 Units <20  Anti-NXP-2 (P140) Ab (RDL) <20 Units <20  Anti-SAE1 Ab, IgG (RDL) <20 Units <20  Anti-PM/Scl-100 Ab (RDL) <20 Units <20  Anti-Ku Ab (RDL) Negative  Negative  Anti-SS-A 52kD Ab, IgG (RDL) <20 Units <20  Anti-U1 RNP Ab (RDL) <20 Units <20  Anti-U2 RNP Ab (RDL) Negative  Negative  Anti-U3 RNP (Fibrillarin)(RDL) Negative  Negative  Scleroderma (Scl-70) (ENA) Antibody, IgG <1.0 NEG AI <1.0 NEG  !: Data is abnormal (H): Data is abnormally high   has a past medical history of Anxiety, CKD (chronic kidney disease) stage 3, GFR 30-59 ml/min (HCC) (12/24/2020), Colon polyps, Costochondritis, GERD (gastroesophageal reflux disease), Hyperlipidemia, Insomnia, Osteoporosis, and Pre-diabetes.   reports that she has been smoking cigarettes. She started smoking about 57 years ago. She has a 57.00 pack-year smoking history. She has never used smokeless tobacco.  Past Surgical History:  Procedure Laterality Date   ABDOMINAL HYSTERECTOMY     COLONOSCOPY  09/10/2011   hysterectomy     POLYPECTOMY     UVULECTOMY N/A 09/09/2016   Procedure: PARTIAL UVULECTOMY, direct laryngoscopy;  Surgeon: Rozetta Nunnery, MD;   Location: [facility];  Service: ENT;  Laterality: N/A;  PARTIAL UVULECTOMY, direct laryngoscopy    Allergies  Allergen Reactions   Fish Oil Diarrhea    Immunization History  Administered Date(s) Administered   Influenza, High Dose Seasonal PF 08/29/2013, 10/25/2015, 11/24/2017, 11/29/2018, 10/15/2021   PFIZER(Purple Top)SARS-COV-2 Vaccination 02/02/2020, 02/28/2020   Pneumococcal Conjugate-13 02/02/2015   Pneumococcal Polysaccharide-23 03/29/2010   Tdap 02/26/2009   Zoster, Live 12/29/2008    Family History  Problem Relation Age of Onset   Heart disease Mother    Heart attack Mother    Heart disease Father    Diabetes Father    Heart attack Father    Cancer Brother        Lung   Bipolar disorder Daughter    Colon cancer Neg Hx    Esophageal cancer Neg Hx    Stomach cancer Neg Hx    Rectal cancer Neg Hx      Current Outpatient Medications:    aspirin 81 MG EC tablet, Take 1 tablet daily, Disp: , Rfl:    CALCIUM PO, Take 2 tablets by mouth daily. 600 mg  2 per day, Disp: , Rfl:    Cholecalciferol (VITAMIN D PO), Take 2,000 Units by mouth daily. Take 5 caps to = 10,000 units daily., Disp: , Rfl:    Cyanocobalamin (VITAMIN B 12 PO), Take 1 tablet by mouth daily., Disp: , Rfl:    ezetimibe (ZETIA) 10 MG tablet, TAKE 1 TABLET BY MOUTH DAILY FOR CHOLESTEROL, Disp: 90 tablet, Rfl: 3   Multiple Vitamin (MULTIVITAMIN PO), Take 1 tablet by mouth daily., Disp: , Rfl:    Psyllium (METAMUCIL FIBER PO), Take by mouth daily., Disp: , Rfl:    rosuvastatin (CRESTOR) 20 MG tablet, TAKE 1 TABLET(20 MG) BY MOUTH DAILY, Disp: 90 tablet, Rfl: 0   traZODone (DESYREL) 150 MG tablet, Take 1/3 tab (50 mg) to 1/2 tab (75 mg) to 1 tablet  1 hour  before Bedtime as needed for Sleep, Disp: 90 tablet, Rfl: 3   Tiotropium Bromide Monohydrate (SPIRIVA RESPIMAT) 2.5 MCG/ACT AERS, Inhale 2 puffs into the lungs daily., Disp: 4 g, Rfl: 5  Objective:   Vitals:   07/03/22 0848  BP:  120/70  Pulse: 83  Temp: 98.1 F (36.7 C)  TempSrc: Oral  SpO2: 95%  Weight: 141 lb (64 kg)  Height: '5\' 3"'$  (1.6 m)    Estimated body mass index is 24.98 kg/m as calculated from the following:   Height as of this encounter: '5\' 3"'$  (1.6 m).   Weight as of this encounter: 141 lb (64 kg).  '@WEIGHTCHANGE'$ @  [employer]   07/03/22 0848  Weight: 141 lb (64 kg)     Physical Exam    General: No distress. Looks well Neuro: Alert and Oriented x 3. GCS 15. Speech normal Psych: Pleasant Resp:  Barrel Chest - no.  Wheeze - no, Crackles - YES lung base, No overt respiratory distress CVS: Normal heart sounds. Murmurs - no Ext: Stigmata of Connective Tissue Disease - n HEENT: Normal upper airway. PEERL +. No post nasal drip        Assessment:       ICD-10-CM   1. ILD (interstitial lung disease) ([place])  J84.9 Ambulatory referral to Cardiothoracic Surgery    2. Tobacco use disorder  F17.200     3. Pulmonary emphysema, unspecified emphysema type ([place])  J43.9      She has mild ILD but it is definitely progressive on the CT scans over time.  Just based on this alone antifibrotic nintedanib is indicated.  On the other hand some of the ILD is a potentially curable especially if there non-- IPF variety.  The etiology of this is not yet determined.  The probability of UIP is low and that is all we know.  She meets ATS guidelines Society indication for surgical lung biopsy .  Discussed the risk benefits and limitations of this from a pulmonology and ATS perspective.  She is agreeable to get this done.  Referred her to the surgeon.  We will hold off on starting empiric treatment right now till we get the biopsy results.  Chief differential diagnosis in her would be chronic HP and smoking-related interstitial fibrosis.  She could also have unclassified ILD.    Plan:     Patient Instructions     ICD-10-CM   1. ILD (interstitial lung disease) ([place])  J84.9     2. Tobacco use disorder  F17.200      3. Pulmonary emphysema, unspecified emphysema type ([place])  J43.9       I think you have had ILD possibly since 2018 and it started getting worse in 2020 I Think and more progressive in 2021.    Broadly tehre are 2 varieties of ILD; IPF and non-IPF. In non-IPF there are many many varieties such as Chronic HP, RB-ILD, SRIF etc, .  For you despite CT, questionnaire and blood work we do NOT know what variety you have  There is also associaed mil emphysema  Plan  - Surgical lung Biopsy is indicated - Refer Dr Kipp Brood or Roxan Hockey for lung biopsy evaluation - start spiriva 2 puff once daily scheduled  - plese work on quitting smoking  Followup  - 10-12 weeks from now with Dr Chase Caller; 30 min visit to discuss biopsy results  ( Level 05 visit: Estb 40-54 min   in  visit type: on-site physical face to visit  in total care time and counseling or/and coordination of care by this undersigned MD - Dr Brand Males. This includes one or more of the following on this same day 07/03/2022: pre-charting, chart review, note  writing, documentation discussion of test results, diagnostic or treatment recommendations, prognosis, risks and benefits of management options, instructions, education, compliance or risk-factor reduction. It excludes time spent by the [facility] or office staff in the care of the patient. Actual time 63 min)   SIGNATURE    Dr. Brand Males, M.D., F.C.C.P,  Pulmonary and Critical Care Medicine Staff Physician, [place] Director - Interstitial Lung Disease  Program  P[facility] [place], [place], [address]  Pager: [phone], If no answer or between  15:00h - 7:00h: call [phone] Telephone: [phone]  5:39 PM 07/03/2022

## 2022-07-03 NOTE — Patient Instructions (Addendum)
ICD-10-CM   1. ILD (interstitial lung disease) ([facility])  J84.9     2. Tobacco use disorder  F17.200     3. Pulmonary emphysema, unspecified emphysema type ([facility])  J43.9       I think you have had ILD possibly since 2018 and it started getting worse in 2020 I Think and more progressive in 2021.    Broadly tehre are 2 varieties of ILD; IPF and non-IPF. In non-IPF there are many many varieties such as Chronic HP, RB-ILD, SRIF etc, .  For you despite CT, questionnaire and blood work we do NOT know what variety you have  There is also associaed mil emphysema  Plan  - Surgical lung Biopsy is indicated - Refer Dr Kipp Brood or Roxan Hockey for lung biopsy evaluation - start spiriva 2 puff once daily scheduled  - plese work on quitting smoking  Followup  - 10-12 weeks from now with Dr Chase Caller; 30 min visit to discuss biopsy results

## 2022-07-14 ENCOUNTER — Other Ambulatory Visit: Payer: Self-pay | Admitting: *Deleted

## 2022-07-14 MED ORDER — SPIRIVA RESPIMAT 2.5 MCG/ACT IN AERS: 2.0000 | INHALATION_SPRAY | Freq: Every day | RESPIRATORY_TRACT | 3 refills | Status: DC

## 2022-08-06 ENCOUNTER — Encounter: Payer: Self-pay | Admitting: *Deleted

## 2022-08-06 ENCOUNTER — Institutional Professional Consult (permissible substitution) (INDEPENDENT_AMBULATORY_CARE_PROVIDER_SITE_OTHER): Payer: Medicare Other | Admitting: Thoracic Surgery (Cardiothoracic Vascular Surgery)

## 2022-08-06 ENCOUNTER — Other Ambulatory Visit: Payer: Self-pay | Admitting: *Deleted

## 2022-08-06 ENCOUNTER — Encounter: Payer: Self-pay | Admitting: Thoracic Surgery (Cardiothoracic Vascular Surgery)

## 2022-08-06 VITALS — BP 147/80 | HR 87 | Resp 18 | Ht 63.0 in | Wt 140.0 lb

## 2022-08-06 DIAGNOSIS — J849 Interstitial pulmonary disease, unspecified: Secondary | ICD-10-CM | POA: Diagnosis not present

## 2022-08-06 DIAGNOSIS — I2510 Atherosclerotic heart disease of native coronary artery without angina pectoris: Secondary | ICD-10-CM | POA: Diagnosis not present

## 2022-08-06 DIAGNOSIS — Z5181 Encounter for therapeutic drug level monitoring: Secondary | ICD-10-CM

## 2022-08-06 NOTE — Progress Notes (Signed)
PCP is [name], MD Referring Provider is Brand Males, MD  Chief Complaint  Patient presents with   Interstitial Lung Disease    Initial surgical consult, CT Chest 5/12, PFT 5/10    HPI: Alison Campbell is sent for consultation regarding a lung biopsy for interstitial lung disease.  Alison Campbell is a 76 year old woman with a history of tobacco abuse, ILD, stage III chronic kidney disease, hyperlipidemia, prediabetes, osteoporosis, and anxiety.  In November 2022 she had a low-dose CT for lung cancer screening.  There was a questionable right upper lobe nodule.  There also was evidence for ILD.  She saw Dr. Valeta Harms.  He did a high-resolution follow-up CT at 6 months.  The nodule had resolved.  She did have evidence of ILD not consistent with UIP.  She was referred to Dr. Chase Caller.  He recommended a lung biopsy for definitive diagnosis in order to guide therapy for ILD.  She continues to smoke about a pack a day.  He is short of breath with heavy exertion when working around the yard but not with walking at a normal pace.  She does have a productive cough with pale yellow sputum.  No chest pain, pressure, or tightness.  No change in appetite or weight loss.  Zubrod Score: At the time of surgery this patient's most appropriate activity status/level should be described as: '[]'$     0    Normal activity, no symptoms '[x]'$     1    Restricted in physical strenuous activity but ambulatory, able to do out light work '[]'$     2    Ambulatory and capable of self care, unable to do work activities, up and about >50 % of waking hours                              '[]'$     3    Only limited self care, in bed greater than 50% of waking hours '[]'$     4    Completely disabled, no self care, confined to bed or chair '[]'$     5    Moribund  Past Medical History:  Diagnosis Date   Anxiety    CKD (chronic kidney disease) stage 3, GFR 30-59 ml/min (HCC) 12/24/2020   Colon polyps    Costochondritis    GERD  (gastroesophageal reflux disease)    coffee related   Hyperlipidemia    Insomnia    Osteoporosis    Pre-diabetes     Past Surgical History:  Procedure Laterality Date   ABDOMINAL HYSTERECTOMY     COLONOSCOPY  09/10/2011   hysterectomy     POLYPECTOMY     UVULECTOMY N/A 09/09/2016   Procedure: PARTIAL UVULECTOMY, direct laryngoscopy;  Surgeon: Rozetta Nunnery, MD;  Location: [facility];  Service: ENT;  Laterality: N/A;  PARTIAL UVULECTOMY, direct laryngoscopy    Family History  Problem Relation Age of Onset   Heart disease Mother    Heart attack Mother    Heart disease Father    Diabetes Father    Heart attack Father    Cancer Brother        Lung   Bipolar disorder Daughter    Colon cancer Neg Hx    Esophageal cancer Neg Hx    Stomach cancer Neg Hx    Rectal cancer Neg Hx     Social History Social History   Tobacco Use   Smoking status: Every  Day    Packs/day: 1.00    Years: 57.00    Total pack years: 57.00    Types: Cigarettes    Start date: 1966   Smokeless tobacco: Never   Tobacco comments:    Currently smoking 0.75ppd as of 07/03/22 ep  Vaping Use   Vaping Use: Never used  Substance Use Topics   Alcohol use: No    Alcohol/week: 0.0 standard drinks of alcohol   Drug use: No    Current Outpatient Medications  Medication Sig Dispense Refill   aspirin 81 MG EC tablet Take 1 tablet daily     CALCIUM PO Take 2 tablets by mouth daily. 600 mg  2 per day     Cholecalciferol (VITAMIN D PO) Take 2,000 Units by mouth daily. Take 5 caps to = 10,000 units daily.     Cyanocobalamin (VITAMIN B 12 PO) Take 1 tablet by mouth daily.     ezetimibe (ZETIA) 10 MG tablet TAKE 1 TABLET BY MOUTH DAILY FOR CHOLESTEROL 90 tablet 3   Multiple Vitamin (MULTIVITAMIN PO) Take 1 tablet by mouth daily.     Psyllium (METAMUCIL FIBER PO) Take by mouth daily.     rosuvastatin (CRESTOR) 20 MG tablet TAKE 1 TABLET(20 MG) BY MOUTH DAILY 90 tablet 0   Tiotropium Bromide  Monohydrate (SPIRIVA RESPIMAT) 2.5 MCG/ACT AERS Inhale 2 puffs into the lungs daily. 12 g 3   traZODone (DESYREL) 150 MG tablet Take 1/3 tab (50 mg) to 1/2 tab (75 mg) to 1 tablet  1 hour  before Bedtime as needed for Sleep 90 tablet 3   No current facility-administered medications for this visit.    Allergies  Allergen Reactions   Fish Oil Diarrhea    Review of Systems  Constitutional:  Negative for activity change (Relatively inactive), fatigue and unexpected weight change.  Respiratory:  Positive for cough and shortness of breath. Negative for wheezing.   Cardiovascular:  Negative for chest pain and leg swelling.  Musculoskeletal:  Negative for arthralgias.  Neurological:  Negative for weakness.  Psychiatric/Behavioral:  The patient is nervous/anxious.   All other systems reviewed and are negative.   BP (!) 147/80 (BP Location: Right Arm, Patient Position: Sitting)   Pulse 87   Resp 18   Ht '5\' 3"'$  (1.6 m)   Wt 140 lb (63.5 kg)   SpO2 93% Comment: RA  BMI 24.80 kg/m  Physical Exam Vitals reviewed.  Constitutional:      General: She is not in acute distress.    Appearance: Normal appearance.  HENT:     Head: Normocephalic and atraumatic.  Eyes:     General: No scleral icterus.    Extraocular Movements: Extraocular movements intact.  Neck:     Vascular: No carotid bruit.  Cardiovascular:     Rate and Rhythm: Normal rate and regular rhythm.     Heart sounds: Normal heart sounds. No murmur heard.    No friction rub. No gallop.  Pulmonary:     Effort: Pulmonary effort is normal. No respiratory distress.     Breath sounds: Normal breath sounds. No wheezing or rales.  Abdominal:     General: There is no distension.     Palpations: Abdomen is soft.  Lymphadenopathy:     Cervical: No cervical adenopathy.  Skin:    General: Skin is warm and dry.  Neurological:     General: No focal deficit present.     Mental Status: She is alert and oriented to person, place,  and  time.     Cranial Nerves: No cranial nerve deficit.     Motor: No weakness.    Diagnostic Tests: CT CHEST WITHOUT CONTRAST   TECHNIQUE: Multidetector CT imaging of the chest was performed following the standard protocol without intravenous contrast. High resolution imaging of the lungs, as well as inspiratory and expiratory imaging, was performed.   RADIATION DOSE REDUCTION: This exam was performed according to the departmental dose-optimization program which includes automated exposure control, adjustment of the mA and/or kV according to patient size and/or use of iterative reconstruction technique.   COMPARISON:  11/02/2021   FINDINGS: Cardiovascular: Aortic atherosclerosis. Normal heart size. Left and right coronary artery calcifications. No pericardial effusion.   Mediastinum/Nodes: No enlarged mediastinal, hilar, or axillary lymph nodes. Thyroid gland, trachea, and esophagus demonstrate no significant findings.   Lungs/Pleura: Minimal paraseptal emphysema. Subpleural nodule of the anterior right upper lobe new on prior examination is diminished in fullness and conspicuity, with an irregular remnant in this vicinity (series 13, image 109). Additional small pulmonary nodules are unchanged. Unchanged mild pulmonary fibrosis in a pattern with apical to basal gradient featuring irregular peripheral interstitial opacity, ground-glass, and varicoid bronchiectasis without evidence of subpleural bronchiolectasis or honeycombing. No significant air trapping on expiratory phase imaging. No pleural effusion or pneumothorax.   Upper Abdomen: No acute abnormality.   Musculoskeletal: No chest wall abnormality. No suspicious osseous lesions identified.   IMPRESSION: 1. Subpleural nodule of the anterior right upper lobe new on prior examination is diminished in fullness and conspicuity, with an irregular remnant in this vicinity. Findings are consistent nonspecific sequelae of  infection or inflammation, for which no further specific follow-up is required. Additional small pulmonary nodules are unchanged. Lung RADS category 2, benign appearance or behavior. Consider ongoing low-dose CT lung cancer screening if indicated by patient age, smoking history, and/or other risk factors for lung cancer. 2. Unchanged mild pulmonary fibrosis in a pattern with apical to basal gradient featuring irregular peripheral interstitial opacity, ground-glass, and varicoid bronchiectasis without evidence of subpleural bronchiolectasis or honeycombing. Findings are suggestive of an alternative diagnosis (not UIP) per consensus guidelines: Diagnosis of Idiopathic Pulmonary Fibrosis: An Official ATS/ERS/JRS/ALAT Clinical Practice Guideline. [place], Iss 5, [phone], Aug 29 2017. 3. Minimal emphysema. 4. Coronary artery disease.   Aortic Atherosclerosis (ICD10-I70.0) and Emphysema (ICD10-J43.9).     Electronically Signed   By: Delanna Ahmadi M.D.   On: 05/12/2022 15:42 I personally reviewed the CT images.  No findings suspicious for cancer.  Coronary atherosclerosis.  Some interstitial opacities and groundglass opacities.  Pulmonary function testing FVC 1.97 (75%), 2.02 (77%) postbronchodilator FEV1 1.62 (83%), 1.55 (79%) postbronchodilator DLCO 11.74 (63%)  Impression: Alison Campbell is a 76 year old woman with a history of tobacco abuse, ILD, stage III chronic kidney disease, hyperlipidemia, prediabetes, osteoporosis, and anxiety.  She had a CT last year as part of a low-dose lung cancer screening program.  She did have some ILD changes noted there was a questionable right upper lobe nodule.  A follow-up CT in 6 months showed the nodule had resolved.  There were still changes consistent with ILD.  Radiographic changes are not consistent with UIP.  She saw Dr. Chase Caller.  He recommended a lung biopsy to provide a definitive diagnosis to guide therapy.  I  described the proposed surgical procedure to Ms. Dolinski and her daughter.  We would typically go to the right side given relatively equal disease bilaterally.  I informed them  of the general nature of the procedure including the need for general anesthesia, the incisions to be used, the use of the surgical robot, the use of drains to postoperatively, the expected hospital stay, and the overall recovery.  I informed them of the indications, risk, benefits, and alternatives.  She understands the risks include, but not limited to death, MI, DVT, PE, bleeding, possible need for transfusion, infection, prolonged air leak, cardiac arrhythmias, as well as the possibility of other unforeseeable complications.  She wishes to proceed with biopsy.  Tobacco abuse-I emphasized the importance of quitting smoking.  She did not seem interested in doing so at this time.  Plan: Robotic right VATS for lung biopsy on Friday, 08/15/2022  [name], MD [facility] [phone]

## 2022-08-06 NOTE — H&P (View-Only) (Signed)
PCP is [name], MD Referring Provider is Brand Males, MD  Chief Complaint  Patient presents with   Interstitial Lung Disease    Initial surgical consult, CT Chest 5/12, PFT 5/10    HPI: Alison Campbell is sent for consultation regarding a lung biopsy for interstitial lung disease.  Alison Campbell is a 76 year old woman with a history of tobacco abuse, ILD, stage III chronic kidney disease, hyperlipidemia, prediabetes, osteoporosis, and anxiety.  In November 2022 she had a low-dose CT for lung cancer screening.  There was a questionable right upper lobe nodule.  There also was evidence for ILD.  She saw Dr. Valeta Harms.  He did a high-resolution follow-up CT at 6 months.  The nodule had resolved.  She did have evidence of ILD not consistent with UIP.  She was referred to Dr. Chase Caller.  He recommended a lung biopsy for definitive diagnosis in order to guide therapy for ILD.  She continues to smoke about a pack a day.  He is short of breath with heavy exertion when working around the yard but not with walking at a normal pace.  She does have a productive cough with pale yellow sputum.  No chest pain, pressure, or tightness.  No change in appetite or weight loss.  Zubrod Score: At the time of surgery this patient's most appropriate activity status/level should be described as: '[]'$     0    Normal activity, no symptoms '[x]'$     1    Restricted in physical strenuous activity but ambulatory, able to do out light work '[]'$     2    Ambulatory and capable of self care, unable to do work activities, up and about >50 % of waking hours                              '[]'$     3    Only limited self care, in bed greater than 50% of waking hours '[]'$     4    Completely disabled, no self care, confined to bed or chair '[]'$     5    Moribund  Past Medical History:  Diagnosis Date   Anxiety    CKD (chronic kidney disease) stage 3, GFR 30-59 ml/min (HCC) 12/24/2020   Colon polyps    Costochondritis    GERD  (gastroesophageal reflux disease)    coffee related   Hyperlipidemia    Insomnia    Osteoporosis    Pre-diabetes     Past Surgical History:  Procedure Laterality Date   ABDOMINAL HYSTERECTOMY     COLONOSCOPY  09/10/2011   hysterectomy     POLYPECTOMY     UVULECTOMY N/A 09/09/2016   Procedure: PARTIAL UVULECTOMY, direct laryngoscopy;  Surgeon: Rozetta Nunnery, MD;  Location: [facility];  Service: ENT;  Laterality: N/A;  PARTIAL UVULECTOMY, direct laryngoscopy    Family History  Problem Relation Age of Onset   Heart disease Mother    Heart attack Mother    Heart disease Father    Diabetes Father    Heart attack Father    Cancer Brother        Lung   Bipolar disorder Daughter    Colon cancer Neg Hx    Esophageal cancer Neg Hx    Stomach cancer Neg Hx    Rectal cancer Neg Hx     Social History Social History   Tobacco Use   Smoking status: Every  Day    Packs/day: 1.00    Years: 57.00    Total pack years: 57.00    Types: Cigarettes    Start date: 1966   Smokeless tobacco: Never   Tobacco comments:    Currently smoking 0.75ppd as of 07/03/22 ep  Vaping Use   Vaping Use: Never used  Substance Use Topics   Alcohol use: No    Alcohol/week: 0.0 standard drinks of alcohol   Drug use: No    Current Outpatient Medications  Medication Sig Dispense Refill   aspirin 81 MG EC tablet Take 1 tablet daily     CALCIUM PO Take 2 tablets by mouth daily. 600 mg  2 per day     Cholecalciferol (VITAMIN D PO) Take 2,000 Units by mouth daily. Take 5 caps to = 10,000 units daily.     Cyanocobalamin (VITAMIN B 12 PO) Take 1 tablet by mouth daily.     ezetimibe (ZETIA) 10 MG tablet TAKE 1 TABLET BY MOUTH DAILY FOR CHOLESTEROL 90 tablet 3   Multiple Vitamin (MULTIVITAMIN PO) Take 1 tablet by mouth daily.     Psyllium (METAMUCIL FIBER PO) Take by mouth daily.     rosuvastatin (CRESTOR) 20 MG tablet TAKE 1 TABLET(20 MG) BY MOUTH DAILY 90 tablet 0   Tiotropium Bromide  Monohydrate (SPIRIVA RESPIMAT) 2.5 MCG/ACT AERS Inhale 2 puffs into the lungs daily. 12 g 3   traZODone (DESYREL) 150 MG tablet Take 1/3 tab (50 mg) to 1/2 tab (75 mg) to 1 tablet  1 hour  before Bedtime as needed for Sleep 90 tablet 3   No current facility-administered medications for this visit.    Allergies  Allergen Reactions   Fish Oil Diarrhea    Review of Systems  Constitutional:  Negative for activity change (Relatively inactive), fatigue and unexpected weight change.  Respiratory:  Positive for cough and shortness of breath. Negative for wheezing.   Cardiovascular:  Negative for chest pain and leg swelling.  Musculoskeletal:  Negative for arthralgias.  Neurological:  Negative for weakness.  Psychiatric/Behavioral:  The patient is nervous/anxious.   All other systems reviewed and are negative.   BP (!) 147/80 (BP Location: Right Arm, Patient Position: Sitting)   Pulse 87   Resp 18   Ht '5\' 3"'$  (1.6 m)   Wt 140 lb (63.5 kg)   SpO2 93% Comment: RA  BMI 24.80 kg/m  Physical Exam Vitals reviewed.  Constitutional:      General: She is not in acute distress.    Appearance: Normal appearance.  HENT:     Head: Normocephalic and atraumatic.  Eyes:     General: No scleral icterus.    Extraocular Movements: Extraocular movements intact.  Neck:     Vascular: No carotid bruit.  Cardiovascular:     Rate and Rhythm: Normal rate and regular rhythm.     Heart sounds: Normal heart sounds. No murmur heard.    No friction rub. No gallop.  Pulmonary:     Effort: Pulmonary effort is normal. No respiratory distress.     Breath sounds: Normal breath sounds. No wheezing or rales.  Abdominal:     General: There is no distension.     Palpations: Abdomen is soft.  Lymphadenopathy:     Cervical: No cervical adenopathy.  Skin:    General: Skin is warm and dry.  Neurological:     General: No focal deficit present.     Mental Status: She is alert and oriented to person, place,  and  time.     Cranial Nerves: No cranial nerve deficit.     Motor: No weakness.    Diagnostic Tests: CT CHEST WITHOUT CONTRAST   TECHNIQUE: Multidetector CT imaging of the chest was performed following the standard protocol without intravenous contrast. High resolution imaging of the lungs, as well as inspiratory and expiratory imaging, was performed.   RADIATION DOSE REDUCTION: This exam was performed according to the departmental dose-optimization program which includes automated exposure control, adjustment of the mA and/or kV according to patient size and/or use of iterative reconstruction technique.   COMPARISON:  11/02/2021   FINDINGS: Cardiovascular: Aortic atherosclerosis. Normal heart size. Left and right coronary artery calcifications. No pericardial effusion.   Mediastinum/Nodes: No enlarged mediastinal, hilar, or axillary lymph nodes. Thyroid gland, trachea, and esophagus demonstrate no significant findings.   Lungs/Pleura: Minimal paraseptal emphysema. Subpleural nodule of the anterior right upper lobe new on prior examination is diminished in fullness and conspicuity, with an irregular remnant in this vicinity (series 13, image 109). Additional small pulmonary nodules are unchanged. Unchanged mild pulmonary fibrosis in a pattern with apical to basal gradient featuring irregular peripheral interstitial opacity, ground-glass, and varicoid bronchiectasis without evidence of subpleural bronchiolectasis or honeycombing. No significant air trapping on expiratory phase imaging. No pleural effusion or pneumothorax.   Upper Abdomen: No acute abnormality.   Musculoskeletal: No chest wall abnormality. No suspicious osseous lesions identified.   IMPRESSION: 1. Subpleural nodule of the anterior right upper lobe new on prior examination is diminished in fullness and conspicuity, with an irregular remnant in this vicinity. Findings are consistent nonspecific sequelae of  infection or inflammation, for which no further specific follow-up is required. Additional small pulmonary nodules are unchanged. Lung RADS category 2, benign appearance or behavior. Consider ongoing low-dose CT lung cancer screening if indicated by patient age, smoking history, and/or other risk factors for lung cancer. 2. Unchanged mild pulmonary fibrosis in a pattern with apical to basal gradient featuring irregular peripheral interstitial opacity, ground-glass, and varicoid bronchiectasis without evidence of subpleural bronchiolectasis or honeycombing. Findings are suggestive of an alternative diagnosis (not UIP) per consensus guidelines: Diagnosis of Idiopathic Pulmonary Fibrosis: An Official ATS/ERS/JRS/ALAT Clinical Practice Guideline. [place], Iss 5, [phone], Aug 29 2017. 3. Minimal emphysema. 4. Coronary artery disease.   Aortic Atherosclerosis (ICD10-I70.0) and Emphysema (ICD10-J43.9).     Electronically Signed   By: Delanna Ahmadi M.D.   On: 05/12/2022 15:42 I personally reviewed the CT images.  No findings suspicious for cancer.  Coronary atherosclerosis.  Some interstitial opacities and groundglass opacities.  Pulmonary function testing FVC 1.97 (75%), 2.02 (77%) postbronchodilator FEV1 1.62 (83%), 1.55 (79%) postbronchodilator DLCO 11.74 (63%)  Impression: Alison Campbell is a 76 year old woman with a history of tobacco abuse, ILD, stage III chronic kidney disease, hyperlipidemia, prediabetes, osteoporosis, and anxiety.  She had a CT last year as part of a low-dose lung cancer screening program.  She did have some ILD changes noted there was a questionable right upper lobe nodule.  A follow-up CT in 6 months showed the nodule had resolved.  There were still changes consistent with ILD.  Radiographic changes are not consistent with UIP.  She saw Dr. Chase Caller.  He recommended a lung biopsy to provide a definitive diagnosis to guide therapy.  I  described the proposed surgical procedure to Ms. Michaelis and her daughter.  We would typically go to the right side given relatively equal disease bilaterally.  I informed them  of the general nature of the procedure including the need for general anesthesia, the incisions to be used, the use of the surgical robot, the use of drains to postoperatively, the expected hospital stay, and the overall recovery.  I informed them of the indications, risk, benefits, and alternatives.  She understands the risks include, but not limited to death, MI, DVT, PE, bleeding, possible need for transfusion, infection, prolonged air leak, cardiac arrhythmias, as well as the possibility of other unforeseeable complications.  She wishes to proceed with biopsy.  Tobacco abuse-I emphasized the importance of quitting smoking.  She did not seem interested in doing so at this time.  Plan: Robotic right VATS for lung biopsy on Friday, 08/15/2022  [name], MD [facility] [phone]

## 2022-08-13 ENCOUNTER — Encounter (HOSPITAL_COMMUNITY)
Admission: RE | Admit: 2022-08-13 | Discharge: 2022-08-13 | Disposition: A | Discharge status: Home / Self Care | Payer: Medicare Other | Source: Ambulatory Visit | Attending: Thoracic Surgery (Cardiothoracic Vascular Surgery) | Admitting: Thoracic Surgery (Cardiothoracic Vascular Surgery)

## 2022-08-13 ENCOUNTER — Other Ambulatory Visit: Payer: Self-pay

## 2022-08-13 ENCOUNTER — Ambulatory Visit (HOSPITAL_COMMUNITY)
Admission: RE | Admit: 2022-08-13 | Discharge: 2022-08-13 | Disposition: A | Discharge status: Home / Self Care | Payer: Medicare Other | Source: Ambulatory Visit | Attending: Thoracic Surgery (Cardiothoracic Vascular Surgery) | Admitting: Thoracic Surgery (Cardiothoracic Vascular Surgery)

## 2022-08-13 VITALS — BP 157/78 | HR 75 | Temp 98.1°F | Resp 17 | Ht 63.0 in | Wt 142.0 lb

## 2022-08-13 DIAGNOSIS — J849 Interstitial pulmonary disease, unspecified: Secondary | ICD-10-CM | POA: Insufficient documentation

## 2022-08-13 DIAGNOSIS — Z01818 Encounter for other preprocedural examination: Secondary | ICD-10-CM | POA: Diagnosis not present

## 2022-08-13 DIAGNOSIS — Z5181 Encounter for therapeutic drug level monitoring: Secondary | ICD-10-CM | POA: Insufficient documentation

## 2022-08-13 DIAGNOSIS — Z20822 Contact with and (suspected) exposure to covid-19: Secondary | ICD-10-CM | POA: Diagnosis not present

## 2022-08-13 LAB — TYPE AND SCREEN
ABO/RH(D): O POS
Antibody Screen: NEGATIVE

## 2022-08-13 LAB — URINALYSIS, ROUTINE W REFLEX MICROSCOPIC
Bacteria, UA: NONE SEEN
Bilirubin Urine: NEGATIVE
Glucose, UA: NEGATIVE mg/dL
Hgb urine dipstick: NEGATIVE
Ketones, ur: NEGATIVE mg/dL
Nitrite: NEGATIVE
Protein, ur: NEGATIVE mg/dL
Specific Gravity, Urine: 1.013 (ref 1.005–1.030)
pH: 5 (ref 5.0–8.0)

## 2022-08-13 LAB — PROTIME-INR
INR: 0.9 (ref 0.8–1.2)
Prothrombin Time: 12.3 seconds (ref 11.4–15.2)

## 2022-08-13 LAB — BLOOD GAS, ARTERIAL
Acid-Base Excess: 1.7 mmol/L (ref 0.0–2.0)
Bicarbonate: 25.8 mmol/L (ref 20.0–28.0)
Drawn by: 53815
O2 Saturation: 97.1 %
Patient temperature: 37
pCO2 arterial: 38 mmHg (ref 32–48)
pH, Arterial: 7.44 (ref 7.35–7.45)
pO2, Arterial: 82 mmHg — ABNORMAL LOW (ref 83–108)

## 2022-08-13 LAB — COMPREHENSIVE METABOLIC PANEL
ALT: 15 U/L (ref 0–44)
AST: 19 U/L (ref 15–41)
Albumin: 3.7 g/dL (ref 3.5–5.0)
Alkaline Phosphatase: 55 U/L (ref 38–126)
Anion gap: 7 (ref 5–15)
BUN: 16 mg/dL (ref 8–23)
CO2: 24 mmol/L (ref 22–32)
Calcium: 9.6 mg/dL (ref 8.9–10.3)
Chloride: 107 mmol/L (ref 98–111)
Creatinine, Ser: 1.08 mg/dL — ABNORMAL HIGH (ref 0.44–1.00)
GFR, Estimated: 53 mL/min — ABNORMAL LOW (ref >60)
Glucose, Bld: 94 mg/dL (ref 70–99)
Potassium: 4.2 mmol/L (ref 3.5–5.1)
Sodium: 138 mmol/L (ref 135–145)
Total Bilirubin: 0.5 mg/dL (ref 0.3–1.2)
Total Protein: 6.7 g/dL (ref 6.5–8.1)

## 2022-08-13 LAB — APTT: aPTT: 29 seconds (ref 24–36)

## 2022-08-13 LAB — CBC
HCT: 41.6 % (ref 36.0–46.0)
Hemoglobin: 13.5 g/dL (ref 12.0–15.0)
MCH: 30.3 pg (ref 26.0–34.0)
MCHC: 32.5 g/dL (ref 30.0–36.0)
MCV: 93.5 fL (ref 80.0–100.0)
Platelets: 282 10*3/uL (ref 150–400)
RBC: 4.45 MIL/uL (ref 3.87–5.11)
RDW: 13.2 % (ref 11.5–15.5)
WBC: 8.8 10*3/uL (ref 4.0–10.5)
nRBC: 0 % (ref 0.0–0.2)

## 2022-08-13 LAB — SURGICAL PCR SCREEN

## 2022-08-13 NOTE — Progress Notes (Signed)
PCP - [name] MD Cardiologist - Candee Furbish MD  PPM/ICD - denies Device Orders -  Rep Notified -   Chest x-ray - 08/13/22 EKG - 08/13/22 Stress Test - 03/19/18 ECHO - none Cardiac Cath - none  Sleep Study - none CPAP -   Fasting Blood Sugar - na Checks Blood Sugar _____ times a day  Blood Thinner Instructions:na Aspirin Instructions:pt to contact Dr. Leonarda Salon office for instructions regarding aspirin.   ERAS Protcol -no PRE-SURGERY Ensure or G2-   COVID TEST- 08/13/22   Anesthesia review: no  Patient denies shortness of breath, fever, cough and chest pain at PAT appointment   All instructions explained to the patient, with a verbal understanding of the material. Patient agrees to go over the instructions while at home for a better understanding. Patient also instructed to wear a mask when out in public prior to surgery after being tested for COVID-19. The opportunity to ask questions was provided.

## 2022-08-13 NOTE — Progress Notes (Signed)
Surgical Instructions    Your procedure is scheduled on Friday August 18.  Report to [facility] Main Entrance "A" at 10 A.M., then check in with the Admitting office.  Call this number if you have problems the morning of surgery:  [phone]   If you have any questions prior to your surgery date call [phone]: Open Monday-Friday 8am-4pm    Remember:  Do not eat or drink anything after midnight the night before your surgery    Take these medicines the morning of surgery with A SIP OF WATER: none  Follow your surgeon's instructions regarding Aspirin. If no instructions were given please call the office.   As of today, STOP taking any Aleve, Naproxen, Ibuprofen, Motrin, Advil, Goody's, BC's, all herbal medications, fish oil, and all vitamins.           Do not wear jewelry or makeup. Do not wear lotions, powders, perfumes/cologne or deodorant. Do not shave 48 hours prior to surgery.  Men may shave face and neck. Do not bring valuables to the hospital. Do not wear nail polish, gel polish, artificial nails, or any other type of covering on natural nails (fingers and toes) If you have artificial nails or gel coating that need to be removed by a nail salon, please have this removed prior to surgery. Artificial nails or gel coating may interfere with anesthesia's ability to adequately monitor your vital signs.  [facility] is not responsible for any belongings or valuables.    Do NOT Smoke (Tobacco/Vaping)  24 hours prior to your procedure  If you use a CPAP at night, you may bring your mask for your overnight stay.   Contacts, glasses, hearing aids, dentures or partials may not be worn into surgery, please bring cases for these belongings   For patients admitted to the hospital, discharge time will be determined by your treatment team.   Patients discharged the day of surgery will not be allowed to drive home, and someone needs to stay with them for 24 hours.   SURGICAL WAITING  ROOM VISITATION Patients having surgery or a procedure may have no more than 2 support people in the waiting area - these visitors may rotate.   Children under the age of 44 must have an adult with them who is not the patient. If the patient needs to stay at the hospital during part of their recovery, the visitor guidelines for inpatient rooms apply. Pre-op nurse will coordinate an appropriate time for 1 support person to accompany patient in pre-op.  This support person may not rotate.   Please refer to the [facility] website for the visitor guidelines for Inpatients (after your surgery is over and you are in a regular room).    Special instructions:    Oral Hygiene is also important to reduce your risk of infection.  Remember - BRUSH YOUR TEETH THE MORNING OF SURGERY WITH YOUR REGULAR TOOTHPASTE   [facility] For [facility]  Before surgery, you can play an important role. Because skin is not sterile, your skin needs to be as free of germs as possible. You can reduce the number of germs on your skin by washing with CHG (chlorahexidine gluconate) Soap before surgery.  CHG is an antiseptic cleaner which kills germs and bonds with the skin to continue killing germs even after washing.     Please do not use if you have an allergy to CHG or antibacterial soaps. If your skin becomes reddened/irritated stop using the CHG.  Do  not shave (including legs and underarms) for at least 48 hours prior to first CHG shower. It is OK to shave your face.  Please follow these instructions carefully.     Shower the NIGHT BEFORE SURGERY and the MORNING OF SURGERY with CHG Soap.   If you chose to wash your hair, wash your hair first as usual with your normal shampoo. After you shampoo, rinse your hair and body thoroughly to remove the shampoo.  Then [employer] and genitals (private parts) with your normal soap and rinse thoroughly to remove soap.  After that Use CHG Soap as you would any other liquid  soap. You can apply CHG directly to the skin and wash gently with a scrungie or a clean washcloth.   Apply the CHG Soap to your body ONLY FROM THE NECK DOWN.  Do not use on open wounds or open sores. Avoid contact with your eyes, ears, mouth and genitals (private parts). Wash Face and genitals (private parts)  with your normal soap.   Wash thoroughly, paying special attention to the area where your surgery will be performed.  Thoroughly rinse your body with warm water from the neck down.  DO NOT shower/wash with your normal soap after using and rinsing off the CHG Soap.  Pat yourself dry with a CLEAN TOWEL.  Wear CLEAN PAJAMAS to bed the night before surgery  Place CLEAN SHEETS on your bed the night before your surgery  DO NOT SLEEP WITH PETS.   Day of Surgery:  Take a shower with CHG soap. Wear Clean/Comfortable clothing the morning of surgery Do not apply any deodorants/lotions.   Remember to brush your teeth WITH YOUR REGULAR TOOTHPASTE.    If you received a COVID test during your pre-op visit, it is requested that you wear a mask when out in public, stay away from anyone that may not be feeling well, and notify your surgeon if you develop symptoms. If you have been in contact with anyone that has tested positive in the last 10 days, please notify your surgeon.    Please read over the following fact sheets that you were given.

## 2022-08-14 LAB — SARS CORONAVIRUS 2 (TAT 6-24 HRS): SARS Coronavirus 2: NEGATIVE

## 2022-08-15 ENCOUNTER — Encounter (HOSPITAL_COMMUNITY)
Admission: RE | Disposition: A | Discharge status: Home / Self Care | Payer: Self-pay | Source: Home / Self Care | Attending: Thoracic Surgery (Cardiothoracic Vascular Surgery)

## 2022-08-15 ENCOUNTER — Inpatient Hospital Stay (HOSPITAL_COMMUNITY): Payer: Medicare Other

## 2022-08-15 ENCOUNTER — Other Ambulatory Visit: Payer: Self-pay

## 2022-08-15 ENCOUNTER — Observation Stay (HOSPITAL_COMMUNITY)
Admission: RE | Admit: 2022-08-15 | Discharge: 2022-08-16 | Disposition: A | Discharge status: Home / Self Care | Payer: Medicare Other | Attending: Thoracic Surgery (Cardiothoracic Vascular Surgery) | Admitting: Thoracic Surgery (Cardiothoracic Vascular Surgery)

## 2022-08-15 ENCOUNTER — Inpatient Hospital Stay (HOSPITAL_COMMUNITY): Payer: Medicare Other | Admitting: Physician Assistant

## 2022-08-15 ENCOUNTER — Encounter (HOSPITAL_COMMUNITY): Payer: Self-pay | Admitting: Thoracic Surgery (Cardiothoracic Vascular Surgery)

## 2022-08-15 ENCOUNTER — Inpatient Hospital Stay (HOSPITAL_BASED_OUTPATIENT_CLINIC_OR_DEPARTMENT_OTHER): Payer: Medicare Other | Admitting: Certified Registered Nurse Anesthetist

## 2022-08-15 DIAGNOSIS — F17210 Nicotine dependence, cigarettes, uncomplicated: Secondary | ICD-10-CM

## 2022-08-15 DIAGNOSIS — J8410 Pulmonary fibrosis, unspecified: Secondary | ICD-10-CM | POA: Diagnosis not present

## 2022-08-15 DIAGNOSIS — Z7982 Long term (current) use of aspirin: Secondary | ICD-10-CM | POA: Insufficient documentation

## 2022-08-15 DIAGNOSIS — I2510 Atherosclerotic heart disease of native coronary artery without angina pectoris: Secondary | ICD-10-CM

## 2022-08-15 DIAGNOSIS — J189 Pneumonia, unspecified organism: Secondary | ICD-10-CM | POA: Diagnosis not present

## 2022-08-15 DIAGNOSIS — N189 Chronic kidney disease, unspecified: Secondary | ICD-10-CM | POA: Diagnosis not present

## 2022-08-15 DIAGNOSIS — I129 Hypertensive chronic kidney disease with stage 1 through stage 4 chronic kidney disease, or unspecified chronic kidney disease: Secondary | ICD-10-CM | POA: Diagnosis not present

## 2022-08-15 DIAGNOSIS — R7303 Prediabetes: Secondary | ICD-10-CM | POA: Diagnosis not present

## 2022-08-15 DIAGNOSIS — N183 Chronic kidney disease, stage 3 unspecified: Secondary | ICD-10-CM | POA: Diagnosis not present

## 2022-08-15 DIAGNOSIS — Z9889 Other specified postprocedural states: Secondary | ICD-10-CM | POA: Diagnosis not present

## 2022-08-15 DIAGNOSIS — Z87891 Personal history of nicotine dependence: Secondary | ICD-10-CM | POA: Diagnosis not present

## 2022-08-15 DIAGNOSIS — J984 Other disorders of lung: Secondary | ICD-10-CM | POA: Diagnosis not present

## 2022-08-15 DIAGNOSIS — Z79899 Other long term (current) drug therapy: Secondary | ICD-10-CM | POA: Diagnosis not present

## 2022-08-15 DIAGNOSIS — R918 Other nonspecific abnormal finding of lung field: Secondary | ICD-10-CM | POA: Diagnosis not present

## 2022-08-15 DIAGNOSIS — J849 Interstitial pulmonary disease, unspecified: Secondary | ICD-10-CM

## 2022-08-15 HISTORY — PX: LUNG BIOPSY: SHX5088

## 2022-08-15 HISTORY — PX: INTERCOSTAL NERVE BLOCK: SHX5021

## 2022-08-15 LAB — ABO/RH: ABO/RH(D): O POS

## 2022-08-15 LAB — SURGICAL PCR SCREEN
MRSA, PCR: NEGATIVE
Staphylococcus aureus: NEGATIVE

## 2022-08-15 LAB — GLUCOSE, CAPILLARY
Glucose-Capillary: 153 mg/dL — ABNORMAL HIGH (ref 70–99)
Glucose-Capillary: 149 mg/dL — ABNORMAL HIGH (ref 70–99)

## 2022-08-15 SURGERY — THORACOSCOPY, ROBOT-ASSISTED
Anesthesia: General | Site: Chest | Laterality: Right

## 2022-08-15 MED ORDER — HYDROMORPHONE HCL 1 MG/ML IJ SOLN
INTRAMUSCULAR | Status: DC | PRN
  Administered 2022-08-15: 1 mg via INTRAVENOUS

## 2022-08-15 MED ORDER — FENTANYL CITRATE (PF) 250 MCG/5ML IJ SOLN
INTRAMUSCULAR | Status: AC
  Filled 2022-08-15: qty 5

## 2022-08-15 MED ORDER — SENNOSIDES-DOCUSATE SODIUM 8.6-50 MG PO TABS
1.0000 | ORAL_TABLET | Freq: Every day | ORAL | Status: DC
  Administered 2022-08-15: 1 via ORAL
  Filled 2022-08-15: qty 1

## 2022-08-15 MED ORDER — CHLORHEXIDINE GLUCONATE 0.12 % MT SOLN
15.0000 mL | Freq: Once | OROMUCOSAL | Status: AC
Start: 2022-08-15 — End: 2022-08-15
  Administered 2022-08-15: 15 mL via OROMUCOSAL
  Filled 2022-08-15: qty 15

## 2022-08-15 MED ORDER — ALBUTEROL SULFATE (2.5 MG/3ML) 0.083% IN NEBU
2.5000 mg | INHALATION_SOLUTION | Freq: Four times a day (QID) | RESPIRATORY_TRACT | Status: DC | PRN
Start: 2022-08-15 — End: 2022-08-16

## 2022-08-15 MED ORDER — PHENYLEPHRINE 80 MCG/ML (10ML) SYRINGE FOR IV PUSH (FOR BLOOD PRESSURE SUPPORT)
PREFILLED_SYRINGE | INTRAVENOUS | Status: DC | PRN
  Administered 2022-08-15 (×2): 120 ug via INTRAVENOUS
  Administered 2022-08-15 (×2): 80 ug via INTRAVENOUS

## 2022-08-15 MED ORDER — BUPIVACAINE HCL (PF) 0.5 % IJ SOLN
INTRAMUSCULAR | Status: AC
  Filled 2022-08-15: qty 30

## 2022-08-15 MED ORDER — INSULIN ASPART 100 UNIT/ML IJ SOLN
0.0000 [IU] | Freq: Three times a day (TID) | INTRAMUSCULAR | Status: DC
  Administered 2022-08-15 – 2022-08-16 (×4): 2 [IU] via SUBCUTANEOUS

## 2022-08-15 MED ORDER — LACTATED RINGERS IV SOLN: INTRAVENOUS | Status: DC

## 2022-08-15 MED ORDER — SODIUM CHLORIDE FLUSH 0.9 % IV SOLN
INTRAVENOUS | Status: DC | PRN
  Administered 2022-08-15: 96 mL

## 2022-08-15 MED ORDER — HYDROMORPHONE HCL 1 MG/ML IJ SOLN
INTRAMUSCULAR | Status: AC
  Filled 2022-08-15: qty 0.5

## 2022-08-15 MED ORDER — ACETAMINOPHEN 500 MG PO TABS
1000.0000 mg | ORAL_TABLET | Freq: Four times a day (QID) | ORAL | Status: DC
  Administered 2022-08-15 – 2022-08-16 (×4): 1000 mg via ORAL
  Filled 2022-08-15 (×5): qty 2

## 2022-08-15 MED ORDER — CEFAZOLIN SODIUM-DEXTROSE 2-4 GM/100ML-% IV SOLN
2.0000 g | INTRAVENOUS | Status: AC
  Administered 2022-08-15: 2 g via INTRAVENOUS
  Filled 2022-08-15: qty 100

## 2022-08-15 MED ORDER — LACTATED RINGERS IV SOLN: INTRAVENOUS | Status: DC | PRN

## 2022-08-15 MED ORDER — ASPIRIN 81 MG PO TBEC
81.0000 mg | DELAYED_RELEASE_TABLET | Freq: Every day | ORAL | Status: DC
  Filled 2022-08-15: qty 1

## 2022-08-15 MED ORDER — SUGAMMADEX SODIUM 200 MG/2ML IV SOLN
INTRAVENOUS | Status: DC | PRN
  Administered 2022-08-15: 200 mg via INTRAVENOUS

## 2022-08-15 MED ORDER — AMISULPRIDE (ANTIEMETIC) 5 MG/2ML IV SOLN: 10.0000 mg | Freq: Once | INTRAVENOUS | Status: DC | PRN

## 2022-08-15 MED ORDER — LIDOCAINE 2% (20 MG/ML) 5 ML SYRINGE
INTRAMUSCULAR | Status: DC | PRN
  Administered 2022-08-15: 60 mg via INTRAVENOUS

## 2022-08-15 MED ORDER — EZETIMIBE 10 MG PO TABS
10.0000 mg | ORAL_TABLET | Freq: Every day | ORAL | Status: DC
  Administered 2022-08-16: 10 mg via ORAL
  Filled 2022-08-15: qty 1

## 2022-08-15 MED ORDER — ACETAMINOPHEN 160 MG/5ML PO SOLN: 1000.0000 mg | Freq: Four times a day (QID) | ORAL | Status: DC

## 2022-08-15 MED ORDER — FENTANYL CITRATE (PF) 250 MCG/5ML IJ SOLN
INTRAMUSCULAR | Status: DC | PRN
  Administered 2022-08-15 (×2): 100 ug via INTRAVENOUS
  Administered 2022-08-15: 50 ug via INTRAVENOUS

## 2022-08-15 MED ORDER — KCL IN DEXTROSE-NACL 20-5-0.45 MEQ/L-%-% IV SOLN
INTRAVENOUS | Status: DC
Start: 2022-08-15 — End: 2022-08-16
  Filled 2022-08-15 (×2): qty 1000

## 2022-08-15 MED ORDER — FENTANYL CITRATE (PF) 100 MCG/2ML IJ SOLN: 25.0000 ug | INTRAMUSCULAR | Status: DC | PRN

## 2022-08-15 MED ORDER — 0.9 % SODIUM CHLORIDE (POUR BTL) OPTIME
TOPICAL | Status: DC | PRN
  Administered 2022-08-15: 1000 mL

## 2022-08-15 MED ORDER — FENTANYL CITRATE PF 50 MCG/ML IJ SOSY: 25.0000 ug | PREFILLED_SYRINGE | INTRAMUSCULAR | Status: DC | PRN

## 2022-08-15 MED ORDER — SODIUM CHLORIDE 0.9 % IV SOLN
INTRAVENOUS | Status: AC | PRN
  Administered 2022-08-15: 1000 mL

## 2022-08-15 MED ORDER — ONDANSETRON HCL 4 MG/2ML IJ SOLN
INTRAMUSCULAR | Status: DC | PRN
  Administered 2022-08-15: 4 mg via INTRAVENOUS

## 2022-08-15 MED ORDER — ONDANSETRON HCL 4 MG/2ML IJ SOLN
4.0000 mg | Freq: Four times a day (QID) | INTRAMUSCULAR | Status: DC | PRN
Start: 2022-08-15 — End: 2022-08-16
  Administered 2022-08-16: 4 mg via INTRAVENOUS
  Filled 2022-08-15: qty 2

## 2022-08-15 MED ORDER — PHENYLEPHRINE HCL-NACL 20-0.9 MG/250ML-% IV SOLN
INTRAVENOUS | Status: DC | PRN
  Administered 2022-08-15: 30 ug/min via INTRAVENOUS

## 2022-08-15 MED ORDER — BUPIVACAINE LIPOSOME 1.3 % IJ SUSP
INTRAMUSCULAR | Status: AC
  Filled 2022-08-15: qty 20

## 2022-08-15 MED ORDER — PSYLLIUM 95 % PO PACK
PACK | Freq: Every day | ORAL | Status: DC
  Administered 2022-08-16: 1 via ORAL
  Filled 2022-08-15 (×2): qty 1

## 2022-08-15 MED ORDER — DEXAMETHASONE SODIUM PHOSPHATE 10 MG/ML IJ SOLN
INTRAMUSCULAR | Status: DC | PRN
  Administered 2022-08-15: 10 mg via INTRAVENOUS

## 2022-08-15 MED ORDER — ROSUVASTATIN CALCIUM 20 MG PO TABS
20.0000 mg | ORAL_TABLET | Freq: Every day | ORAL | Status: DC
  Administered 2022-08-15 – 2022-08-16 (×2): 20 mg via ORAL
  Filled 2022-08-15 (×3): qty 1

## 2022-08-15 MED ORDER — PROPOFOL 10 MG/ML IV BOLUS
INTRAVENOUS | Status: DC | PRN
  Administered 2022-08-15: 20 mg via INTRAVENOUS
  Administered 2022-08-15: 80 mg via INTRAVENOUS
  Administered 2022-08-15 (×2): 40 mg via INTRAVENOUS
  Administered 2022-08-15: 20 mg via INTRAVENOUS

## 2022-08-15 MED ORDER — EPHEDRINE 5 MG/ML INJ
INTRAVENOUS | Status: AC
  Filled 2022-08-15: qty 5

## 2022-08-15 MED ORDER — ENOXAPARIN SODIUM 40 MG/0.4ML IJ SOSY
40.0000 mg | PREFILLED_SYRINGE | INTRAMUSCULAR | Status: DC
  Administered 2022-08-16: 40 mg via SUBCUTANEOUS
  Filled 2022-08-15: qty 0.4

## 2022-08-15 MED ORDER — ORAL CARE MOUTH RINSE: 15.0000 mL | Freq: Once | OROMUCOSAL | Status: AC

## 2022-08-15 MED ORDER — EPHEDRINE SULFATE-NACL 50-0.9 MG/10ML-% IV SOSY
PREFILLED_SYRINGE | INTRAVENOUS | Status: DC | PRN
  Administered 2022-08-15: 5 mg via INTRAVENOUS

## 2022-08-15 MED ORDER — ROCURONIUM BROMIDE 10 MG/ML (PF) SYRINGE
PREFILLED_SYRINGE | INTRAVENOUS | Status: DC | PRN
  Administered 2022-08-15: 10 mg via INTRAVENOUS
  Administered 2022-08-15: 40 mg via INTRAVENOUS

## 2022-08-15 MED ORDER — TRAZODONE HCL 50 MG PO TABS
50.0000 mg | ORAL_TABLET | Freq: Every evening | ORAL | Status: DC | PRN
Start: 2022-08-15 — End: 2022-08-16

## 2022-08-15 MED ORDER — BISACODYL 5 MG PO TBEC
10.0000 mg | DELAYED_RELEASE_TABLET | Freq: Every day | ORAL | Status: DC
  Administered 2022-08-15: 10 mg via ORAL
  Filled 2022-08-15: qty 2

## 2022-08-15 MED ORDER — OXYCODONE HCL 5 MG PO TABS: 5.0000 mg | ORAL_TABLET | ORAL | Status: DC | PRN

## 2022-08-15 MED ORDER — CEFAZOLIN SODIUM-DEXTROSE 2-4 GM/100ML-% IV SOLN
2.0000 g | Freq: Three times a day (TID) | INTRAVENOUS | Status: AC
  Administered 2022-08-15 – 2022-08-16 (×2): 2 g via INTRAVENOUS
  Filled 2022-08-15 (×2): qty 100

## 2022-08-15 MED ORDER — ACETAMINOPHEN 500 MG PO TABS
1000.0000 mg | ORAL_TABLET | Freq: Once | ORAL | Status: AC
Start: 2022-08-15 — End: 2022-08-15
  Administered 2022-08-15: 1000 mg via ORAL
  Filled 2022-08-15: qty 2

## 2022-08-15 SURGICAL SUPPLY — 70 items
CANISTER SUCT 3000ML PPV (MISCELLANEOUS) ×1 IMPLANT
CATH THORACIC 36FR (CATHETERS) IMPLANT
CNTNR URN SCR LID CUP LEK RST (MISCELLANEOUS) ×5 IMPLANT
CONT SPEC 4OZ STRL OR WHT (MISCELLANEOUS) ×5
DEFOGGER SCOPE WARMER CLEARIFY (MISCELLANEOUS) ×1 IMPLANT
DERMABOND ADVANCED (GAUZE/BANDAGES/DRESSINGS) ×1
DERMABOND ADVANCED .7 DNX12 (GAUZE/BANDAGES/DRESSINGS) IMPLANT
DRAIN CHANNEL 28F RND 3/8 FF (WOUND CARE) IMPLANT
DRAPE ARM DVNC X/XI (DISPOSABLE) ×4 IMPLANT
DRAPE COLUMN DVNC XI (DISPOSABLE) ×1 IMPLANT
DRAPE CV SPLIT W-CLR ANES SCRN (DRAPES) ×1 IMPLANT
DRAPE DA VINCI XI ARM (DISPOSABLE) ×4
DRAPE DA VINCI XI COLUMN (DISPOSABLE) ×1
DRAPE INCISE IOBAN 66X45 STRL (DRAPES) IMPLANT
DRAPE ORTHO SPLIT 77X108 STRL (DRAPES) ×1
DRAPE SURG ORHT 6 SPLT 77X108 (DRAPES) ×1 IMPLANT
ELECT BLADE 6.5 EXT (BLADE) IMPLANT
ELECT REM PT RETURN 9FT ADLT (ELECTROSURGICAL) ×1
ELECTRODE REM PT RTRN 9FT ADLT (ELECTROSURGICAL) ×1 IMPLANT
GAUZE 4X4 16PLY ~~LOC~~+RFID DBL (SPONGE) ×1 IMPLANT
GAUZE SPONGE 4X4 12PLY STRL (GAUZE/BANDAGES/DRESSINGS) ×1 IMPLANT
GLOVE SURG MICRO LTX SZ7.5 (GLOVE) ×2 IMPLANT
GOWN STRL REUS W/ TWL LRG LVL3 (GOWN DISPOSABLE) ×2 IMPLANT
GOWN STRL REUS W/ TWL XL LVL3 (GOWN DISPOSABLE) ×3 IMPLANT
GOWN STRL REUS W/TWL 2XL LVL3 (GOWN DISPOSABLE) ×2 IMPLANT
GOWN STRL REUS W/TWL LRG LVL3 (GOWN DISPOSABLE) ×2
GOWN STRL REUS W/TWL XL LVL3 (GOWN DISPOSABLE) ×3
HEMOSTAT SURGICEL 2X14 (HEMOSTASIS) ×3 IMPLANT
IRRIGATION STRYKERFLOW (MISCELLANEOUS) ×1 IMPLANT
IRRIGATOR STRYKERFLOW (MISCELLANEOUS) ×1
KIT BASIN OR (CUSTOM PROCEDURE TRAY) ×1 IMPLANT
KIT TURNOVER KIT B (KITS) ×1 IMPLANT
NDL HYPO 25GX1X1/2 BEV (NEEDLE) ×1 IMPLANT
NDL SPNL 18GX3.5 QUINCKE PK (NEEDLE) ×1 IMPLANT
NEEDLE HYPO 25GX1X1/2 BEV (NEEDLE) ×1 IMPLANT
NEEDLE SPNL 18GX3.5 QUINCKE PK (NEEDLE) ×1 IMPLANT
NS IRRIG 1000ML POUR BTL (IV SOLUTION) ×1 IMPLANT
PACK CHEST (CUSTOM PROCEDURE TRAY) ×1 IMPLANT
PAD ARMBOARD 7.5X6 YLW CONV (MISCELLANEOUS) ×2 IMPLANT
RELOAD STAPLE 45 3.5 BLU DVNC (STAPLE) IMPLANT
RELOAD STAPLE 45 4.3 GRN DVNC (STAPLE) IMPLANT
RELOAD STAPLER 3.5X45 BLU DVNC (STAPLE) ×6 IMPLANT
RELOAD STAPLER 4.3X45 GRN DVNC (STAPLE) ×2 IMPLANT
SEAL CANN UNIV 5-8 DVNC XI (MISCELLANEOUS) ×3 IMPLANT
SEAL XI 5MM-8MM UNIVERSAL (MISCELLANEOUS) ×2
SET TRI-LUMEN FLTR TB AIRSEAL (TUBING) ×1 IMPLANT
SPONGE T-LAP 18X18 ~~LOC~~+RFID (SPONGE) ×4 IMPLANT
SPONGE TONSIL TAPE 1 RFD (DISPOSABLE) IMPLANT
STAPLER 45 DA VINCI SURE FORM (STAPLE) ×1
STAPLER 45 SUREFORM DVNC (STAPLE) IMPLANT
STAPLER CANNULA SEAL DVNC XI (STAPLE) IMPLANT
STAPLER CANNULA SEAL XI (STAPLE) ×2
STAPLER RELOAD 3.5X45 BLU DVNC (STAPLE) ×6
STAPLER RELOAD 3.5X45 BLUE (STAPLE) ×6
STAPLER RELOAD 4.3X45 GREEN (STAPLE) ×2
STAPLER RELOAD 4.3X45 GRN DVNC (STAPLE) ×2
SUT VIC AB 1 CTX 36 (SUTURE) ×1
SUT VIC AB 1 CTX36XBRD ANBCTR (SUTURE) IMPLANT
SUT VIC AB 2-0 CTX 36 (SUTURE) IMPLANT
SUT VIC AB 3-0 X1 27 (SUTURE) ×2 IMPLANT
SUT VICRYL 0 TIES 12 18 (SUTURE) ×1 IMPLANT
SUT VICRYL 0 UR6 27IN ABS (SUTURE) ×2 IMPLANT
SYR 30ML LL (SYRINGE) ×1 IMPLANT
SYSTEM RETRIEVAL ANCHOR 8 (MISCELLANEOUS) IMPLANT
SYSTEM SAHARA CHEST DRAIN ATS (WOUND CARE) ×1 IMPLANT
TAPE CLOTH 4X10 WHT NS (GAUZE/BANDAGES/DRESSINGS) ×1 IMPLANT
TOWEL GREEN STERILE (TOWEL DISPOSABLE) ×1 IMPLANT
TRAY FOLEY MTR SLVR 16FR STAT (SET/KITS/TRAYS/PACK) ×1 IMPLANT
TROCAR PORT AIRSEAL 12X150 (TUBING) ×1 IMPLANT
WATER STERILE IRR 1000ML POUR (IV SOLUTION) ×1 IMPLANT

## 2022-08-15 NOTE — Anesthesia Procedure Notes (Signed)
Procedure Name: Intubation Date/Time: 08/15/2022 12:20 PM  Performed by: Janace Litten, CRNAPre-anesthesia Checklist: Patient identified, Emergency Drugs available, Suction available and Patient being monitored Patient Re-evaluated:Patient Re-evaluated prior to induction Oxygen Delivery Method: Circle System Utilized Preoxygenation: Pre-oxygenation with 100% oxygen Induction Type: IV induction Ventilation: Mask ventilation without difficulty Laryngoscope Size: Glidescope and 3 Tube type: Oral Endobronchial tube: Left, Double lumen EBT and EBT position confirmed by auscultation and 35 Fr Number of attempts: 2 Airway Equipment and Method: Stylet and Oral airway Placement Confirmation: ETT inserted through vocal cords under direct vision, positive ETCO2 and breath sounds checked- equal and bilateral Tube secured with: Tape Dental Injury: Teeth and Oropharynx as per pre-operative assessment  Difficulty Due To: Difficulty was anticipated and Difficult Airway- due to anterior larynx Comments: VLx1 with glidescope 3 - grade I view, attempted to pass 37Fr. DLT - unable to advance tracheal cuff past vocal cords. VLx2 with glidescope 3 - grade I view, able to advance 35Fr. DLT

## 2022-08-15 NOTE — Brief Op Note (Signed)
08/15/2022  2:19 PM  PATIENT:  Alison Campbell  76 y.o. female  PRE-OPERATIVE DIAGNOSIS:  Interstitial Lung Disease  POST-OPERATIVE DIAGNOSIS:  Interstitial Lung Disease  PROCEDURE:  Procedure(s): XI ROBOTIC ASSISTED THORACOSCOPY (Right) LUNG BIOPSY (Right) INTERCOSTAL NERVE BLOCK (Right)  SURGEON:  Surgeon(s) and Role:    * [name], MD - Primary  PHYSICIAN ASSISTANT:   ASSISTANTS: Nicanor Alcon, MD  ANESTHESIA:   General  EBL:  50 mL   BLOOD ADMINISTERED:none  DRAINS:  28 F Chest Tube(s) in the right    LOCAL MEDICATIONS USED:  OTHER 20 ml liposomal bupivacaine, 30 ml 0.5% bupivacaine  SPECIMEN:  Source of Specimen:  LLL x 2, LUL x 1  DISPOSITION OF SPECIMEN:  PATHOLOGY  COUNTS:  YES  TOURNIQUET:  * No tourniquets in log *  DICTATION: .Other Dictation: Dictation Number -  PLAN OF CARE: Admit to inpatient   PATIENT DISPOSITION:  PACU - hemodynamically stable.   Delay start of Pharmacological VTE agent (>24hrs) due to surgical blood loss or risk of bleeding: no

## 2022-08-15 NOTE — Op Note (Signed)
Alison Campbell, [name] MEDICAL RECORD NO: [id number] ACCOUNT NO: [id number] DATE OF BIRTH: 06/14/1946 FACILITY: [facility] LOCATION: [facility] PHYSICIAN: Revonda Standard. Roxan Hockey, MD  Operative Report   DATE OF PROCEDURE: 08/15/2022  PREOPERATIVE DIAGNOSIS:  Interstitial lung disease.  POSTOPERATIVE DIAGNOSIS:  Interstitial lung disease.  PROCEDURES:   Xi robotic-assisted right thoracoscopy,  Lung biopsy,  Intercostal nerve blocks levels 3 through 10.  SURGEON:  Revonda Standard. Roxan Hockey, MD  ASSISTANT:  Romie Levee, MD.  ANESTHESIA:  General.  FINDINGS:  Frozen section revealed interstitial fibrosis, no tumor seen.  CLINICAL NOTE:  Alison Campbell is a 76 year old woman with a history of tobacco abuse with progressive dyspnea.  Workup revealed interstitial lung disease.  She was referred for lung biopsy.  The indications, risks, benefits, and alternatives were  discussed in detail with the patient.  She understood this was diagnostic and not therapeutic.  She accepted the risks and agreed to proceed.  OPERATIVE NOTE: Mrs. Alison Campbell was brought to the preoperative holding area on 08/15/2022.  Anesthesia established intravenous access and placed an arterial blood pressure monitoring line.  She was taken to the operating room and anesthetized and  intubated with a double lumen endotracheal tube.  Intravenous antibiotics were administered.  A Foley catheter was placed.  Sequential compression devices were placed on the calves for DVT prophylaxis.  She was placed in a left lateral decubitus  position.  A Bair Hugger was placed for active warming.  The right chest was prepped and draped in the usual sterile fashion.  Single lung ventilation of the left lung was initiated and was tolerated well throughout the procedure.  A timeout was performed.  A solution containing 20 mL of liposomal bupivacaine, 30 mL of 0.5% bupivacaine and 50 mL of saline was prepared.  This solution was used for local at the incision  sites as well as for the intercostal nerve blocks.  An incision was made  in the eighth interspace in the midaxillary line, an 8 mm port was inserted.  The thoracoscope was advanced into the chest.  After confirming intrapleural placement, carbon dioxide was insufflated per protocol.  A 12 mm port was placed in the eighth  interspace anterior to the camera port.  Intercostal nerve blocks then were performed from the third to the tenth interspace injecting 10 mL of the bupivacaine solution into a subpleural plane at each level.  Two additional eighth interspace ports were  placed and then a 12 mm AirSeal port was placed in the tenth interspace posterolaterally.  The robot was deployed.  The camera arm was docked, and targeting was performed.  The remaining arms were docked.  Robotic instruments were inserted with thoracoscopic  visualization.  The lung was inspected, the area that was most apparent with disease on the CT was at the base of the lower lobes.  A biopsy was taken with sequential firings of the robotic stapler from the lateral inferior edge of the lower lobe.  This biopsy  was removed through the AirSeal port and sent for frozen section to confirm lesional tissue.  While awaiting those results a biopsy was obtained from the posterior inferior edge of the right upper lobe.  The middle lobe appeared relatively normal.  The upper  lobe biopsy was also performed with the robotic stapler using a combination of blue and green cartridges.  Finally, a second biopsy was taken from the lower lobe, slightly more posterior to the first biopsy.  A small portion of the biopsy was sent for  AFB and fungal cultures.  The remainder was sent for permanent pathology.  The frozen section returned showing interstitial fibrosis and interstitial changes.  No tumor was seen.  All suture lines and port sites were inspected for hemostasis.  The chest  was copiously irrigated with saline.  The robotic instruments were  removed.  The robot was undocked.  A 28-French Blake drain was placed through the anteriormost incision and secured with #1 silk suture.  The remaining incisions were closed in standard  fashion.  Dermabond was applied.  The chest tube was placed to a Pleur-Evac on waterseal.  All sponge, needle and instrument counts were correct at the end of the procedure.  The patient was placed back in a supine position.  She was extubated in the  operating room and taken to the postanesthetic care unit in good condition.  Experienced assistance was necessary for this case due to surgical complexity.  Dr. Romie Levee provided assistance with port placement, instrument exchange, specimen retrieval, suctioning, and wound closure.     SUJ D: 08/15/2022 4:13:39 pm T: 08/15/2022 10:17:00 pm  JOB: [id number]/ [id number]

## 2022-08-15 NOTE — Transfer of Care (Addendum)
Immediate Anesthesia Transfer of Care Note  Patient: Alison Campbell  Procedure(s) Performed: XI ROBOTIC ASSISTED THORACOSCOPY (Right: Chest) LUNG BIOPSY (Right)  Patient Location: PACU  Anesthesia Type:General  Level of Consciousness: drowsy, patient cooperative and responds to stimulation  Airway & Oxygen Therapy: Patient Spontanous Breathing and Patient connected to nasal cannula oxygen  Post-op Assessment: Report given to RN and Post -op Vital signs reviewed and stable  Post vital signs: Reviewed and stable  Last Vitals:  Vitals Value Taken Time  BP    Temp    Pulse    Resp    SpO2      Last Pain:  Vitals:   08/15/22 1009  TempSrc:   PainSc: 0-No pain         Complications: No notable events documented.

## 2022-08-15 NOTE — Interval H&P Note (Signed)
History and Physical Interval Note:  08/15/2022 11:12 AM  Alison Campbell  has presented today for surgery, with the diagnosis of ILD.  The various methods of treatment have been discussed with the patient and family. After consideration of risks, benefits and other options for treatment, the patient has consented to  Procedure(s): XI ROBOTIC ASSISTED THORACOSCOPY (Right) LUNG BIOPSY (Right) as a surgical intervention.  The patient's history has been reviewed, patient examined, no change in status, stable for surgery.  I have reviewed the patient's chart and labs.  Questions were answered to the patient's satisfaction.     [name]

## 2022-08-15 NOTE — Hospital Course (Signed)
History of Present Illness:  HPI: Mrs. Alison Campbell is sent for consultation regarding a lung biopsy for interstitial lung disease.  Alison Campbell is a 76 year old woman with a history of tobacco abuse, ILD, stage III chronic kidney disease, hyperlipidemia, prediabetes, osteoporosis, and anxiety.  In November 2022 she had a low-dose CT for lung cancer screening.  There was a questionable right upper lobe nodule.  There also was evidence for ILD.  She saw Dr. Valeta Campbell.  He did a high-resolution follow-up CT at 6 months.  The nodule had resolved.  She did have evidence of ILD not consistent with UIP.  She was referred to Dr. Chase Campbell.  He recommended a lung biopsy for definitive diagnosis in order to guide therapy for ILD.  She continues to smoke about a pack a day.  He is short of breath with heavy exertion when working around the yard but not with walking at a normal pace.  She does have a productive cough with pale yellow sputum.  No chest pain, pressure, or tightness.  No change in appetite or weight loss.  Dr. Roxan Campbell stated he could perform a lung biopsy.  She was strongly encouraged to stop smoking.  The risks and benefits of the procedure were explained to the patient and he was agreeable to proceed.  Hospital Course:  Alison Campbell presented to [facility] on 08/15/2022.  She was taken to the operating room and underwent Right Robotic Video Assisted Thoracoscopy with lung biopsy.  She tolerated the procedure, was extubated, and taken to the SICU in stable condition.

## 2022-08-15 NOTE — Anesthesia Preprocedure Evaluation (Signed)
Anesthesia Evaluation  Patient identified by MRN, date of birth, ID band Patient awake    Reviewed: Allergy & Precautions, NPO status , Patient's Chart, lab work & pertinent test results  Airway Mallampati: II  TM Distance: >3 FB Neck ROM: Full    Dental  (+) Dental Advisory Given   Pulmonary Current Smoker,  ILD +COPD   breath sounds clear to auscultation       Cardiovascular hypertension, Pt. on medications + CAD   Rhythm:Regular Rate:Normal     Neuro/Psych negative neurological ROS     GI/Hepatic Neg liver ROS, GERD  ,  Endo/Other  negative endocrine ROS  Renal/GU CRFRenal disease     Musculoskeletal   Abdominal   Peds  Hematology negative hematology ROS (+)   Anesthesia Other Findings   Reproductive/Obstetrics                             Lab Results  Component Value Date   WBC 8.8 08/13/2022   HGB 13.5 08/13/2022   HCT 41.6 08/13/2022   MCV 93.5 08/13/2022   PLT 282 08/13/2022   Lab Results  Component Value Date   CREATININE 1.08 (H) 08/13/2022   BUN 16 08/13/2022   NA 138 08/13/2022   K 4.2 08/13/2022   CL 107 08/13/2022   CO2 24 08/13/2022    Anesthesia Physical Anesthesia Plan  ASA: 3  Anesthesia Plan: General   Post-op Pain Management: Tylenol PO (pre-op)*   Induction: Intravenous  PONV Risk Score and Plan: 2 and Dexamethasone, Ondansetron and Treatment may vary due to age or medical condition  Airway Management Planned: Double Lumen EBT  Additional Equipment: ClearSight  Intra-op Plan:   Post-operative Plan: Extubation in OR  Informed Consent: I have reviewed the patients History and Physical, chart, labs and discussed the procedure including the risks, benefits and alternatives for the proposed anesthesia with the patient or authorized representative who has indicated his/her understanding and acceptance.     Dental advisory given  Plan  Discussed with: CRNA  Anesthesia Plan Comments:         Anesthesia Quick Evaluation

## 2022-08-16 ENCOUNTER — Inpatient Hospital Stay (HOSPITAL_COMMUNITY): Payer: Medicare Other

## 2022-08-16 DIAGNOSIS — Z79899 Other long term (current) drug therapy: Secondary | ICD-10-CM | POA: Diagnosis not present

## 2022-08-16 DIAGNOSIS — J9811 Atelectasis: Secondary | ICD-10-CM | POA: Diagnosis not present

## 2022-08-16 DIAGNOSIS — F17210 Nicotine dependence, cigarettes, uncomplicated: Secondary | ICD-10-CM | POA: Diagnosis not present

## 2022-08-16 DIAGNOSIS — J189 Pneumonia, unspecified organism: Secondary | ICD-10-CM | POA: Diagnosis not present

## 2022-08-16 DIAGNOSIS — R7303 Prediabetes: Secondary | ICD-10-CM | POA: Diagnosis not present

## 2022-08-16 DIAGNOSIS — J939 Pneumothorax, unspecified: Secondary | ICD-10-CM | POA: Diagnosis not present

## 2022-08-16 DIAGNOSIS — N183 Chronic kidney disease, stage 3 unspecified: Secondary | ICD-10-CM | POA: Diagnosis not present

## 2022-08-16 DIAGNOSIS — Z7982 Long term (current) use of aspirin: Secondary | ICD-10-CM | POA: Diagnosis not present

## 2022-08-16 LAB — GLUCOSE, CAPILLARY
Glucose-Capillary: 130 mg/dL — ABNORMAL HIGH (ref 70–99)
Glucose-Capillary: 124 mg/dL — ABNORMAL HIGH (ref 70–99)
Glucose-Capillary: 110 mg/dL — ABNORMAL HIGH (ref 70–99)

## 2022-08-16 LAB — BASIC METABOLIC PANEL
Anion gap: 7 (ref 5–15)
BUN: 14 mg/dL (ref 8–23)
CO2: 26 mmol/L (ref 22–32)
Calcium: 8.7 mg/dL — ABNORMAL LOW (ref 8.9–10.3)
Chloride: 107 mmol/L (ref 98–111)
Creatinine, Ser: 1.03 mg/dL — ABNORMAL HIGH (ref 0.44–1.00)
GFR, Estimated: 56 mL/min — ABNORMAL LOW (ref >60)
Glucose, Bld: 153 mg/dL — ABNORMAL HIGH (ref 70–99)
Potassium: 4.7 mmol/L (ref 3.5–5.1)
Sodium: 140 mmol/L (ref 135–145)

## 2022-08-16 LAB — ACID FAST SMEAR (AFB, MYCOBACTERIA): Acid Fast Smear: NEGATIVE

## 2022-08-16 MED ORDER — OXYCODONE HCL 5 MG PO TABS: 5.0000 mg | ORAL_TABLET | Freq: Four times a day (QID) | ORAL | 0 refills | Status: DC | PRN

## 2022-08-16 NOTE — Care Management CC44 (Signed)
Condition Code 44 Documentation Completed  Patient Details  Name: Alison Campbell MRN: [id number] Date of Birth: 03-19-1946   Condition Code 44 given:   yes    Amador Cunas, [place] 08/16/2022, 6:31 PM

## 2022-08-16 NOTE — Progress Notes (Signed)
R. Lateral chest tube out. No c/p from pt. Vitals WDL.

## 2022-08-16 NOTE — Progress Notes (Signed)
     [address]       [place] [address]             [phone]       No events  Vitals:   08/16/22 0757 08/16/22 1100  BP: 132/61 134/65  Pulse: 92 85  Resp: 20 18  Temp: 97.8 F (36.6 C) 97.7 F (36.5 C)  SpO2: 93% 94%   Alert NAD Sinus EWOB, no leak  CXR - no pneumoT  POD 1 s/p R RATS, wedge resection for ILD Will remove CT and repeat CXR If stable, can go home this evening  Alyda Megna O Tagan Bartram

## 2022-08-16 NOTE — Discharge Summary (Addendum)
Physician Discharge Summary       [address]       [place] [address]             [phone]    Patient ID: Alison Campbell MRN: [id number] DOB/AGE: [birth year] 76 y.o.  Admit date: 08/15/2022 Discharge date: 08/16/2022  Admission Diagnoses:   ILD (interstitial lung disease) ([place]) Discharge Diagnoses:  S/p Xi robotic assisted right thoracoscopy, lung biopsy, intercostal nerve block ribs 3 through 10 2. History of the following: Diagnosis Date   Anxiety     CKD (chronic kidney disease) stage 3, GFR 30-59 ml/min (HCC) 12/24/2020   Colon polyps     Costochondritis     GERD (gastroesophageal reflux disease)      coffee related   Hyperlipidemia     Insomnia     Osteoporosis     Pre-diabetes      Consults: None  Procedure (s):  Xi robotic-assisted right thoracoscopy, lung biopsy, intercostal nerve blocks levels 3 through 10 by Dr. Roxan Hockey on 08/15/2022.  Pathology:Final result pending  History of Present Illness:  HPI: Mrs. Campbell is sent for consultation regarding a lung biopsy for interstitial lung disease.  Alison Campbell is a 76 year old woman with a history of tobacco abuse, ILD, stage III chronic kidney disease, hyperlipidemia, prediabetes, osteoporosis, and anxiety.  In November 2022 she had a low-dose CT for lung cancer screening.  There was a questionable right upper lobe nodule.  There also was evidence for ILD.  She saw Dr. Valeta Harms.  He did a high-resolution follow-up CT at 6 months.  The nodule had resolved.  She did have evidence of ILD not consistent with UIP.  She was referred to Dr. Chase Caller.  He recommended a lung biopsy for definitive diagnosis in order to guide therapy for ILD.  She continues to smoke about a pack a day.  He is short of breath with heavy exertion when working around the yard but not with walking at a normal pace.  She does have a productive cough with pale yellow sputum.  No chest pain, pressure, or tightness.  No change  in appetite or weight loss.  Dr. Roxan Hockey stated he could perform a lung biopsy.  She was strongly encouraged to stop smoking.  The risks and benefits of the procedure were explained to the patient and he was agreeable to proceed.  Hospital Course:  Alison Campbell presented to [facility] on [facility].  She was taken to the operating room and underwent Right Robotic Video Assisted Thoracoscopy with lung biopsy.  She tolerated the procedure, was extubated, and taken to PACU in stable condition. Chest tube was to water seal. CXR 08/19 showed no pneumothorax. Chest tube was removed. Follow up chest x ray showed possible small right apical pneumothorax and small amount of subcutaneous emphysema right neck and right lateral chest wall. Wounds are clean and dry. She is tolerating a diet. She has a history of CKD (stage III) and creatinine this am stable at 1.03. She is ambulating on room air. She is felt surgicallys stable for discharge today.    Latest Vital Signs: Blood pressure (!) 171/64, pulse 87, temperature 98.1 F (36.7 C), temperature source Oral, resp. rate 20, height 5' 3.5" (1.613 m), weight 63.5 kg, SpO2 93 %.  Physical Exam: Alert NAD Sinus EWOB, no leak  Discharge Condition: Stable and discharged to home.  Recent laboratory studies:  Lab Results  Component Value Date   WBC 8.8 08/13/2022  HGB 13.5 08/13/2022   HCT 41.6 08/13/2022   MCV 93.5 08/13/2022   PLT 282 08/13/2022   Lab Results  Component Value Date   NA 140 08/16/2022   K 4.7 08/16/2022   CL 107 08/16/2022   CO2 26 08/16/2022   CREATININE 1.03 (H) 08/16/2022   GLUCOSE 153 (H) 08/16/2022      Diagnostic Studies: DG Chest Port 1 View  Result Date: 08/16/2022 CLINICAL DATA:  Pneumothorax. EXAM: PORTABLE CHEST 1 VIEW COMPARISON:  Chest radiograph earlier today. FINDINGS: The previous right-sided chest tube is not definitively seen on the current exam and may have been removed. Suspected small apical  pneumothorax underneath the second rib. Small amount of soft tissue gas in the right neck and right lateral chest wall. Chain sutures in the right mid lung. Streaky atelectasis at the right lung base. Similar linear atelectasis at the left lung base. Stable heart size and mediastinal contours. IMPRESSION: 1. Previous right-sided chest tube is not definitively seen on the current exam and may have been removed. Suspected small right apical pneumothorax. 2. Small amount of soft tissue gas in the right neck and right lateral chest wall. 3. Bibasilar atelectasis. Electronically Signed   By: Keith Rake M.D.   On: 08/16/2022 16:50   DG Chest Port 1 View  Result Date: 08/16/2022 CLINICAL DATA:  Interstitial lung disease. Status post partial resection of the right lung. EXAM: PORTABLE CHEST 1 VIEW COMPARISON:  08/15/2022. FINDINGS: Unchanged position of right chest tube. No pneumothorax visualized. Cardiomediastinal contours are unchanged. There is mild atelectasis identified within the left base, new from previous exam. IMPRESSION: 1. Stable position of right chest tube. No pneumothorax visualized. 2. New left base atelectasis. Electronically Signed   By: Kerby Moors M.D.   On: 08/16/2022 08:46   DG Chest Port 1 View  Result Date: 08/15/2022 CLINICAL DATA:  Status post partial resection of right lung EXAM: PORTABLE CHEST 1 VIEW COMPARISON:  Previous studies including the examination of 08/13/2022 FINDINGS: Cardiac size is within normal limits. There are no signs of pulmonary edema. There is interval placement of right chest tube with its tip in the medial right upper lung field. There are linear densities in both lower lung fields, more so on the right side. There is no significant pleural effusion or pneumothorax. IMPRESSION: Postsurgical changes are noted in right lung. There are linear patchy densities in both lower lung fields suggesting atelectasis. Electronically Signed   By: Elmer Picker M.D.    On: 08/15/2022 14:51   DG Chest 2 View  Result Date: 08/15/2022 CLINICAL DATA:  Preop films for lung surgery history of prominent pulmonary fibrosis EXAM: CHEST - 2 VIEW COMPARISON:  Radiographs 12/14/2020 and CT chest 05/09/2022 FINDINGS: No focal consolidation, pleural effusion, or pneumothorax. Biapical and left basilar scarring. Aortic atherosclerotic calcification. Normal cardiomediastinal silhouette. No acute osseous abnormality. IMPRESSION: No active cardiopulmonary disease. Electronically Signed   By: Placido Sou M.D.   On: 08/15/2022 00:29         Discharge Medications: Allergies as of 08/16/2022       Reactions   Fish Oil Diarrhea   Spiriva Respimat [tiotropium Bromide Monohydrate] Cough   Gaging and excessive phlegm        Medication List     TAKE these medications    aspirin EC 81 MG tablet Take 1 tablet daily   aspirin-acetaminophen-caffeine 250-250-65 MG tablet Commonly known as: EXCEDRIN MIGRAINE Take 1 tablet by mouth every 6 (six) hours as needed  for headache.   CALCIUM 600 + D PO Take 2 tablets by mouth daily.   cholecalciferol 25 MCG (1000 UNIT) tablet Commonly known as: VITAMIN D3 Take 5,000 Units by mouth daily.   ezetimibe 10 MG tablet Commonly known as: ZETIA TAKE 1 TABLET BY MOUTH DAILY FOR CHOLESTEROL   METAMUCIL FIBER PO Take 3 tablets by mouth daily.   MULTIVITAMIN PO Take 1 tablet by mouth daily.   oxyCODONE 5 MG immediate release tablet Commonly known as: Oxy IR/ROXICODONE Take 1 tablet (5 mg total) by mouth every 6 (six) hours as needed for moderate pain.   rosuvastatin 20 MG tablet Commonly known as: CRESTOR TAKE 1 TABLET(20 MG) BY MOUTH DAILY   Spiriva Respimat 2.5 MCG/ACT Aers Generic drug: Tiotropium Bromide Monohydrate Inhale 2 puffs into the lungs daily.   traZODone 150 MG tablet Commonly known as: DESYREL Take 1/3 tab (50 mg) to 1/2 tab (75 mg) to 1 tablet  1 hour  before Bedtime as needed for Sleep   vitamin  C 1000 MG tablet Take 1,000 mg by mouth daily.        Follow Up Appointments:  Follow-up Information     [name], MD. Go on 09/03/2022.   Specialty: Cardiothoracic Surgery Why: PA/LAT CXR (to be taken at [place] which is in the same building as Dr. Leonarda Salon office on 09/06 at 3:30 pm;Appointment time is at 4:00 pm Contact information: [place] [address] [phone]                 Signed: Sharalyn Ink [facility] 08/16/2022, 5:04 PM

## 2022-08-18 ENCOUNTER — Telehealth: Payer: Self-pay | Admitting: Internal Medicine

## 2022-08-18 ENCOUNTER — Encounter (HOSPITAL_COMMUNITY): Payer: Self-pay | Admitting: Thoracic Surgery (Cardiothoracic Vascular Surgery)

## 2022-08-18 LAB — SURGICAL PATHOLOGY

## 2022-08-18 NOTE — Anesthesia Postprocedure Evaluation (Signed)
Anesthesia Post Note  Patient: Alison Campbell  Procedure(s) Performed: XI ROBOTIC ASSISTED THORACOSCOPY (Right: Chest) LUNG BIOPSY (Right: Chest) INTERCOSTAL NERVE BLOCK (Right: Chest)     Patient location during evaluation: PACU Anesthesia Type: General Level of consciousness: awake and alert Pain management: pain level controlled Vital Signs Assessment: post-procedure vital signs reviewed and stable Respiratory status: spontaneous breathing, nonlabored ventilation, respiratory function stable and patient connected to nasal cannula oxygen Cardiovascular status: blood pressure returned to baseline and stable Postop Assessment: no apparent nausea or vomiting Anesthetic complications: yes   Encounter Notable Events  Notable Event Outcome Phase Comment  Difficult to intubate - expected  Intraprocedure Filed from anesthesia note documentation.    Last Vitals:  Vitals:   08/16/22 1100 08/16/22 1603  BP: 134/65 (!) 171/64  Pulse: 85 87  Resp: 18 20  Temp: 36.5 C 36.7 C  SpO2: 94% 93%    Last Pain:  Vitals:   08/16/22 1603  TempSrc: Oral  PainSc:    Pain Goal:                   [name]

## 2022-08-18 NOTE — Telephone Encounter (Signed)
Call from Dr Vic Ripper pathologust- There is cranio caudal gradient. No granuloma. There is microscopic honeycombing. N ofeatures of HP oir NSIP. 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 Can this date be advanced < 2-4 weeks    SIGNATURE    Dr. Brand Males, M.D., F.C.C.P,  Pulmonary and Critical Care Medicine Staff Physician, [place] Director - Interstitial Lung Disease  Program  Medical Director - [facility] [place] at [place], [place], [address]  NPI Number:  NPI #[id number] [facility] Number: [id number]  Pager: [phone], If no answer  -[place] or [facility] [place] Telephone (clinical office): [phone] Telephone (research): [phone]  11:26 AM 08/18/2022

## 2022-08-18 NOTE — Telephone Encounter (Signed)
Patient is scheduled 9/28 at 2pm- reminder mailed. Nothing further needed.

## 2022-08-20 LAB — AEROBIC/ANAEROBIC CULTURE W GRAM STAIN (SURGICAL/DEEP WOUND)
Culture: NO GROWTH
Gram Stain: NONE SEEN

## 2022-08-26 ENCOUNTER — Emergency Department (HOSPITAL_BASED_OUTPATIENT_CLINIC_OR_DEPARTMENT_OTHER)
Admission: EM | Admit: 2022-08-26 | Discharge: 2022-08-26 | Disposition: A | Discharge status: Home / Self Care | Payer: Medicare Other | Attending: Emergency Medicine | Admitting: Emergency Medicine

## 2022-08-26 ENCOUNTER — Emergency Department (HOSPITAL_BASED_OUTPATIENT_CLINIC_OR_DEPARTMENT_OTHER): Payer: Medicare Other | Admitting: Radiology

## 2022-08-26 ENCOUNTER — Other Ambulatory Visit: Payer: Self-pay

## 2022-08-26 ENCOUNTER — Encounter (HOSPITAL_BASED_OUTPATIENT_CLINIC_OR_DEPARTMENT_OTHER): Payer: Self-pay | Admitting: Obstetrics and Gynecology

## 2022-08-26 DIAGNOSIS — R079 Chest pain, unspecified: Secondary | ICD-10-CM | POA: Insufficient documentation

## 2022-08-26 DIAGNOSIS — G8918 Other acute postprocedural pain: Secondary | ICD-10-CM | POA: Diagnosis not present

## 2022-08-26 DIAGNOSIS — R0789 Other chest pain: Secondary | ICD-10-CM | POA: Diagnosis not present

## 2022-08-26 DIAGNOSIS — R110 Nausea: Secondary | ICD-10-CM | POA: Diagnosis not present

## 2022-08-26 DIAGNOSIS — Z7982 Long term (current) use of aspirin: Secondary | ICD-10-CM | POA: Insufficient documentation

## 2022-08-26 DIAGNOSIS — J939 Pneumothorax, unspecified: Secondary | ICD-10-CM | POA: Diagnosis not present

## 2022-08-26 LAB — CBC
HCT: 41.8 % (ref 36.0–46.0)
Hemoglobin: 13.9 g/dL (ref 12.0–15.0)
MCH: 30.5 pg (ref 26.0–34.0)
MCHC: 33.3 g/dL (ref 30.0–36.0)
MCV: 91.7 fL (ref 80.0–100.0)
Platelets: 341 10*3/uL (ref 150–400)
RBC: 4.56 MIL/uL (ref 3.87–5.11)
RDW: 13 % (ref 11.5–15.5)
WBC: 8.4 10*3/uL (ref 4.0–10.5)
nRBC: 0 % (ref 0.0–0.2)

## 2022-08-26 LAB — BASIC METABOLIC PANEL
Anion gap: 10 (ref 5–15)
BUN: 18 mg/dL (ref 8–23)
CO2: 26 mmol/L (ref 22–32)
Calcium: 9.2 mg/dL (ref 8.9–10.3)
Chloride: 104 mmol/L (ref 98–111)
Creatinine, Ser: 1.1 mg/dL — ABNORMAL HIGH (ref 0.44–1.00)
GFR, Estimated: 51 mL/min — ABNORMAL LOW (ref >60)
Glucose, Bld: 106 mg/dL — ABNORMAL HIGH (ref 70–99)
Potassium: 4.2 mmol/L (ref 3.5–5.1)
Sodium: 140 mmol/L (ref 135–145)

## 2022-08-26 LAB — TROPONIN I (HIGH SENSITIVITY)
Troponin I (High Sensitivity): 4 ng/L (ref <18)
Troponin I (High Sensitivity): 3 ng/L (ref <18)

## 2022-08-26 MED ORDER — MORPHINE SULFATE (PF) 4 MG/ML IV SOLN: 4.0000 mg | Freq: Once | INTRAVENOUS | Status: DC

## 2022-08-26 MED ORDER — MORPHINE SULFATE (PF) 2 MG/ML IV SOLN
2.0000 mg | Freq: Once | INTRAVENOUS | Status: AC
  Administered 2022-08-26: 2 mg via INTRAVENOUS
  Filled 2022-08-26: qty 1

## 2022-08-26 MED ORDER — OXYCODONE HCL 5 MG PO TABS: 5.0000 mg | ORAL_TABLET | ORAL | 0 refills | Status: DC | PRN

## 2022-08-26 NOTE — ED Notes (Signed)
Pt to XR

## 2022-08-26 NOTE — Discharge Instructions (Addendum)
Tylenol 650 mg every 4 hours.  Oxycodone '5mg'$  every 4 hours.  Miralax for constipation

## 2022-08-26 NOTE — ED Triage Notes (Addendum)
Patient had lung biopsy x10 days ago. Patient reports her pain is uncontrolled in the chest wall on the right side. States she took tylenol, Oxycodone '5mg'$  and had no relief. Patient reports she has interstitial lung disease and they were doing the biopsy to type the progression and for treatment modalities.   Patient has also not had a BM in x10 days

## 2022-08-28 ENCOUNTER — Telehealth: Payer: Self-pay

## 2022-08-28 ENCOUNTER — Other Ambulatory Visit: Payer: Self-pay | Admitting: Physician Assistant

## 2022-08-28 MED ORDER — TRAMADOL HCL 50 MG PO TABS: 50.0000 mg | ORAL_TABLET | Freq: Two times a day (BID) | ORAL | 0 refills | Status: DC | PRN

## 2022-08-28 NOTE — Telephone Encounter (Signed)
Patient contacted the office stating that she needed another pain medication refill. She is s/p RATS/lung bx with Dr. Roxan Hockey 8/18 and was discharged home on oxycodone 8/19. She was to take Q6hrs PRN for pain. She ended up back in the hospital 8/29 because of her incisional/surgical pain. She was then prescribed another refill of Oxycodone Q4hr prn and states that she only has one pill left. States that she is taking the medication as prescribed. She states that her pain is sharp and steady at her incision sites and the oxycodone does "calm it down".   Spoke with Lars Pinks, PA who states that patient has had a lot of pain medication prescribed to her and she did not feel comfortable prescribing another refill of same medication. Approved refill of Tramadol and was sent electronically to patient's preferred pharmacy, [facility] on [address]. Also advised that she should take Tylenol in between pain medication.   Patient aware of RX change and acknowledged receipt. She also states that she still has not had a bowel movement since she was discharged from the hospital. She has started miralax. Advised that she could take a laxative. She acknowledged receipt.

## 2022-08-29 ENCOUNTER — Telehealth (HOSPITAL_COMMUNITY): Payer: Self-pay | Admitting: Physician Assistant

## 2022-08-29 ENCOUNTER — Telehealth: Payer: Self-pay

## 2022-08-29 MED ORDER — OXYCODONE HCL 5 MG PO TABS: 5.0000 mg | ORAL_TABLET | ORAL | 0 refills | Status: DC | PRN

## 2022-08-29 NOTE — ED Provider Notes (Signed)
Alison Campbell   CSN: [id number] Arrival date & time: 08/26/22  2841     History  Chief Complaint  Patient presents with   Chest Pain    Alison Campbell is a 76 y.o. female.  Pt complains of persistent pain.  Pain not relieved by oxycodone every 6 hours and tylenol every 6 hours.  Pt had a lung biopsy on 8/18    The history is provided by the patient. No language interpreter was used.  Chest Pain Pain location:  R chest Pain quality: aching   Pain radiates to:  Does not radiate Pain severity:  Moderate Timing:  Constant Progression:  Worsening Chronicity:  New Relieved by:  Nothing Worsened by:  Nothing Ineffective treatments:  None tried Associated symptoms: nausea        Home Medications Prior to Admission medications   Medication Sig Start Date End Date Taking? Authorizing Provider  Ascorbic Acid (VITAMIN C) 1000 MG tablet Take 1,000 mg by mouth daily.    [provider]  aspirin 81 MG EC tablet Take 1 tablet daily 08/30/15   [name], MD  aspirin-acetaminophen-caffeine (EXCEDRIN MIGRAINE) [phone] MG tablet Take 1 tablet by mouth every 6 (six) hours as needed for headache.    [provider]  Calcium Carb-Cholecalciferol (CALCIUM 600 + D PO) Take 2 tablets by mouth daily.    [provider]  cholecalciferol (VITAMIN D3) 25 MCG (1000 UNIT) tablet Take 5,000 Units by mouth daily.    [provider]  ezetimibe (ZETIA) 10 MG tablet TAKE 1 TABLET BY MOUTH DAILY FOR CHOLESTEROL 12/11/21   [name], MD  Multiple Vitamin (MULTIVITAMIN PO) Take 1 tablet by mouth daily.    [provider]  oxyCODONE (ROXICODONE) 5 MG immediate release tablet Take 1 tablet (5 mg total) by mouth every 4 (four) hours as needed for severe pain. 08/29/22   Fransico [place], PA-C  Psyllium (METAMUCIL FIBER PO) Take 3 tablets by mouth daily.    [provider]  rosuvastatin (CRESTOR) 20 MG  tablet TAKE 1 TABLET(20 MG) BY MOUTH DAILY 12/27/21   Jerline Pain, MD  Tiotropium Bromide Monohydrate (SPIRIVA RESPIMAT) 2.5 MCG/ACT AERS Inhale 2 puffs into the lungs daily. Patient not taking: Reported on 08/11/2022 07/14/22   Brand Males, MD  traMADol (ULTRAM) 50 MG tablet Take 1 tablet (50 mg total) by mouth every 12 (twelve) hours as needed for severe pain. 08/28/22 08/28/23  Nani Skillern, PA-C  traZODone (DESYREL) 150 MG tablet Take 1/3 tab (50 mg) to 1/2 tab (75 mg) to 1 tablet  1 hour  before Bedtime as needed for Sleep 10/15/21   [name], MD      Allergies    Fish oil and Spiriva respimat [tiotropium bromide monohydrate]    Review of Systems   Review of Systems  Cardiovascular:  Positive for chest pain.  Gastrointestinal:  Positive for nausea.  All other systems reviewed and are negative.   Physical Exam Updated Vital Signs BP (!) 158/76   Pulse 62   Temp 98.3 F (36.8 C) (Oral)   Resp 15   Ht 5' 3.5" (1.613 m)   Wt 63.5 kg   SpO2 95%   BMI 24.41 kg/m  Physical Exam Vitals and nursing Campbell reviewed.  Constitutional:      Appearance: She is well-developed.  HENT:     Head: Normocephalic.  Cardiovascular:     Rate and Rhythm: Normal rate and regular rhythm.  Heart sounds: Normal heart sounds.  Pulmonary:     Effort: Pulmonary effort is normal.     Breath sounds: Normal breath sounds.  Abdominal:     General: There is no distension.     Palpations: Abdomen is soft.  Musculoskeletal:        General: Normal range of motion.     Cervical back: Normal range of motion.  Neurological:     General: No focal deficit present.     Mental Status: She is alert and oriented to person, place, and time.     ED Results / Procedures / Treatments   Labs (all labs ordered are listed, but only abnormal results are displayed) Labs Reviewed  BASIC METABOLIC PANEL - Abnormal; Notable for the following components:      Result Value   Glucose, Bld 106  (*)    Creatinine, Ser 1.10 (*)    GFR, Estimated 51 (*)    All other components within normal limits  CBC  TROPONIN I (HIGH SENSITIVITY)  TROPONIN I (HIGH SENSITIVITY)    EKG EKG Interpretation  Date/Time:  Tuesday August 26 2022 09:17:30 EDT Ventricular Rate:  65 PR Interval:  142 QRS Duration: 82 QT Interval:  404 QTC Calculation: 420 R Axis:   36 Text Interpretation: Normal sinus rhythm Normal ECG When compared with ECG of 13-Aug-2022 13:34, No significant change was found Confirmed by Regan Lemming ([id number]) on 08/26/2022 12:37:52 PM  Radiology No results found.  Procedures Procedures    Medications Ordered in ED Medications  morphine (PF) 2 MG/ML injection 2 mg (2 mg Intravenous Given 08/26/22 1249)    ED Course/ Medical Decision Making/ A&P                           Medical Decision Making Pt complains of pain in her chest at lung biopsy site   Amount and/or Complexity of Data Reviewed Independent Historian: caregiver    Details: Pt is here with her daughter who is her caregiver  External Data Reviewed: notes.    Details: Primary care and pulmonary notes reviewed  Labs: ordered. Decision-making details documented in ED Course.    Details: Labs ordered reviewed and interpreted.   Radiology: ordered and independent interpretation performed. Decision-making details documented in ED Course.    Details: Chest xray  no acute.  - Discussion of management or test interpretation with external provider(s): Pt given IV Morphine.  Pt reports pain has improved.  Pt advised tylenol every 4 hours  oxycodone every 4 hours.  Follow up with your Physicain for recheck   Risk Prescription drug management.           Final Clinical Impression(s) / ED Diagnoses Final diagnoses:  None    Rx / DC Orders ED Discharge Orders          Ordered    oxyCODONE (ROXICODONE) 5 MG immediate release tablet  Every 4 hours PRN,   Status:  Discontinued        08/26/22 1355            An After Visit Summary was printed and given to the patient.    Fransico Meadow, PA-C 08/29/22 1629    Regan Lemming, MD 10/11/22 [phone]

## 2022-08-29 NOTE — Telephone Encounter (Signed)
Pharmacy cahnge

## 2022-08-29 NOTE — Telephone Encounter (Signed)
-----   Message from Antony Odea, [place] sent at 08/29/2022  4:15 PM EDT ----- Regarding: RE: pain med refill Contact: [phone] Looks like she was just given a Oxycodone Rx by a PA in the ED.  I won't prescribe anything further.  Myron ----- Message ----- From: Marylen Ponto, LPN Sent: 06/05/3728   1:08 PM EDT To: Antony Odea, PA-C; # Subject: pain med refill                                Pt is calling again for pain med. Tramadol was given yesterday by DZ. Patient states she has taken several doses and it is not helping her pain. She could not sleep last night and is in tears. She would like to have the Oxycodone 5 mg refilled if you agree. Her pharm is noted in her chart. Please advise SW

## 2022-08-29 NOTE — ED Notes (Signed)
Daughter called requesting to have dc prescription called into [facility]. She reports mail order will not refill a narcotic prescription. Santiago Glad, [place] contacted and new RX called into [facility]

## 2022-09-03 ENCOUNTER — Other Ambulatory Visit: Payer: Self-pay | Admitting: Thoracic Surgery (Cardiothoracic Vascular Surgery)

## 2022-09-03 ENCOUNTER — Ambulatory Visit (INDEPENDENT_AMBULATORY_CARE_PROVIDER_SITE_OTHER): Payer: Self-pay | Admitting: Thoracic Surgery (Cardiothoracic Vascular Surgery)

## 2022-09-03 ENCOUNTER — Ambulatory Visit
Admission: RE | Admit: 2022-09-03 | Discharge: 2022-09-03 | Disposition: A | Discharge status: Home / Self Care | Payer: Medicare Other | Source: Ambulatory Visit | Attending: Thoracic Surgery (Cardiothoracic Vascular Surgery) | Admitting: Thoracic Surgery (Cardiothoracic Vascular Surgery)

## 2022-09-03 VITALS — BP 129/73 | HR 76 | Resp 20 | Ht 63.0 in | Wt 136.0 lb

## 2022-09-03 DIAGNOSIS — Z9889 Other specified postprocedural states: Secondary | ICD-10-CM

## 2022-09-03 DIAGNOSIS — J849 Interstitial pulmonary disease, unspecified: Secondary | ICD-10-CM

## 2022-09-03 DIAGNOSIS — I700 Atherosclerosis of aorta: Secondary | ICD-10-CM | POA: Diagnosis not present

## 2022-09-03 DIAGNOSIS — J984 Other disorders of lung: Secondary | ICD-10-CM | POA: Diagnosis not present

## 2022-09-03 MED ORDER — TRAMADOL HCL 50 MG PO TABS: 50.0000 mg | ORAL_TABLET | Freq: Four times a day (QID) | ORAL | 0 refills | Status: DC | PRN

## 2022-09-03 MED ORDER — GABAPENTIN 300 MG PO CAPS: 300.0000 mg | ORAL_CAPSULE | Freq: Two times a day (BID) | ORAL | 3 refills | Status: DC

## 2022-09-03 NOTE — Progress Notes (Signed)
[address]       [place], [address]             [phone]    HPI: Mrs. Alison Campbell returns for follow-up after recent lung biopsy for ILD.  Alison Campbell is a 76 year old woman with interstitial lung disease.  He was initially found on a low-dose CT for lung cancer screening.  CT showed interstitial lung disease not definitive for UIP.  She saw Dr. Chase Caller and he recommended a lung biopsy to guide therapy.  I did a robotic right VATS for lung biopsy on 08/15/2022.  She went home on postoperative day #1.  Pathology most consistent with UIP.  She has had some issues with pain and actually had to come back to the emergency room for pain.  She is currently taking tramadol couple times a day and Tylenol around-the-clock.  She describes the pain as a sensation that she is bruised on the inside with some numbness and tingling as well.  Past Medical History:  Diagnosis Date   Anxiety    CKD (chronic kidney disease) stage 3, GFR 30-59 ml/min (HCC) 12/24/2020   Colon polyps    Costochondritis    GERD (gastroesophageal reflux disease)    coffee related   Hyperlipidemia    Insomnia    Osteoporosis    Pre-diabetes     Current Outpatient Medications  Medication Sig Dispense Refill   Ascorbic Acid (VITAMIN C) 1000 MG tablet Take 1,000 mg by mouth daily.     Calcium Carb-Cholecalciferol (CALCIUM 600 + D PO) Take 2 tablets by mouth daily.     cholecalciferol (VITAMIN D3) 25 MCG (1000 UNIT) tablet Take 5,000 Units by mouth daily.     ezetimibe (ZETIA) 10 MG tablet TAKE 1 TABLET BY MOUTH DAILY FOR CHOLESTEROL 90 tablet 3   gabapentin (NEURONTIN) 300 MG capsule Take 1 capsule (300 mg total) by mouth 2 (two) times daily. 60 capsule 3   Multiple Vitamin (MULTIVITAMIN PO) Take 1 tablet by mouth daily.     Psyllium (METAMUCIL FIBER PO) Take 3 tablets by mouth daily.     rosuvastatin (CRESTOR) 20 MG tablet TAKE 1 TABLET(20 MG) BY MOUTH DAILY 90 tablet 0   traZODone (DESYREL) 150  MG tablet Take 1/3 tab (50 mg) to 1/2 tab (75 mg) to 1 tablet  1 hour  before Bedtime as needed for Sleep 90 tablet 3   traMADol (ULTRAM) 50 MG tablet Take 1 tablet (50 mg total) by mouth every 6 (six) hours as needed for severe pain. 25 tablet 0   No current facility-administered medications for this visit.    Physical Exam BP 129/73   Pulse 76   Resp 20   Ht '5\' 3"'$  (1.6 m)   Wt 136 lb (61.7 kg)   SpO2 95% Comment: RA  BMI 24.53 kg/m  76 year old woman in no acute distress but obvious discomfort Incisions clean dry and intact  Diagnostic Tests: CHEST - 2 VIEW   COMPARISON:  08/26/2022, CT 05/09/2022   FINDINGS: Chain sutures in the right upper lung. Mild pleuroparenchymal scarring at the apex on the right. Minimal atelectasis or scarring at the bases. No acute airspace disease, pleural effusion, or pneumothorax. Aortic atherosclerosis.   IMPRESSION: No active cardiopulmonary disease. Postsurgical changes in the right upper lung. Mild pleuroparenchymal scarring at the right apex. Minimal atelectasis or scarring at the bases     Electronically Signed   By: Donavan Foil M.D.   On:  09/03/2022 15:34  Impression: Alison Campbell is a 76 year old woman with interstitial lung disease who had a robotic lung biopsy about 3 weeks ago.  She is still having a significant amount of postoperative pain.  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  She uses the Tylenol around-the-clock and then takes the tramadol when necessary.  She had been having to use it more frequently than every 12 hours.  I think she might benefit from a trial of gabapentin.  I gave her prescription for 300 mg twice daily.  She will start just taking it at night for the first 3 days and then if she does not have undue side effects will go to twice a day.  I also refilled her prescription for tramadol 50 mg p.o. every 6 hours as  needed, 25 tablets, no refills.  She knows to call if she needs more of those.  She will continue the Tylenol.  Advised her not to drive or engage in any heavy physical activity until her pain improves.  She had questions about her pathology.  I did inform her that she has UIP.  She has an appointment with Dr. Chase Caller later this month and can discuss treatment options.  Plan: Gabapentin 300 mg p.o. twice daily Tramadol 50 mg p.o. every 6 hours as needed Follow-up with Dr. Chase Caller as scheduled Return in 3 weeks with chest x-ray to check on progress  [name], MD [facility] [phone]

## 2022-09-15 LAB — FUNGUS CULTURE RESULT

## 2022-09-15 LAB — FUNGUS CULTURE WITH STAIN

## 2022-09-15 LAB — FUNGAL ORGANISM REFLEX

## 2022-09-22 ENCOUNTER — Other Ambulatory Visit: Payer: Self-pay | Admitting: Thoracic Surgery (Cardiothoracic Vascular Surgery)

## 2022-09-22 DIAGNOSIS — J849 Interstitial pulmonary disease, unspecified: Secondary | ICD-10-CM

## 2022-09-23 ENCOUNTER — Ambulatory Visit (INDEPENDENT_AMBULATORY_CARE_PROVIDER_SITE_OTHER): Payer: Self-pay | Admitting: Thoracic Surgery (Cardiothoracic Vascular Surgery)

## 2022-09-23 ENCOUNTER — Ambulatory Visit
Admission: RE | Admit: 2022-09-23 | Discharge: 2022-09-23 | Disposition: A | Discharge status: Home / Self Care | Payer: Medicare Other | Source: Ambulatory Visit | Attending: Thoracic Surgery (Cardiothoracic Vascular Surgery) | Admitting: Thoracic Surgery (Cardiothoracic Vascular Surgery)

## 2022-09-23 VITALS — BP 160/78 | HR 78 | Resp 18 | Ht 63.0 in | Wt 140.0 lb

## 2022-09-23 DIAGNOSIS — J849 Interstitial pulmonary disease, unspecified: Secondary | ICD-10-CM

## 2022-09-23 DIAGNOSIS — Z9889 Other specified postprocedural states: Secondary | ICD-10-CM

## 2022-09-23 DIAGNOSIS — I700 Atherosclerosis of aorta: Secondary | ICD-10-CM | POA: Diagnosis not present

## 2022-09-23 NOTE — Progress Notes (Signed)
      [address]       [place] [address]             [phone]     HPI: Mrs. 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  She underwent a lung biopsy on 08/15/2022.  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  She went home on postoperative day #1 but came back to the office with some significant incisional pain.  I started her on gabapentin 300 mg twice daily.  She was also using tramadol as needed.  She now returns 3 weeks later for a follow-up.  She still has some pain along the right costal margin, but it has improved dramatically.  She is using the gabapentin, but no longer having to take tramadol.  No respiratory issues.  Past Medical History:  Diagnosis Date   Anxiety    CKD (chronic kidney disease) stage 3, GFR 30-59 ml/min (HCC) 12/24/2020   Colon polyps    Costochondritis    GERD (gastroesophageal reflux disease)    coffee related   Hyperlipidemia    Insomnia    Osteoporosis    Pre-diabetes     Current Outpatient Medications  Medication Sig Dispense Refill   Ascorbic Acid (VITAMIN C) 1000 MG tablet Take 1,000 mg by mouth daily.     Calcium Carb-Cholecalciferol (CALCIUM 600 + D PO) Take 2 tablets by mouth daily.     cholecalciferol (VITAMIN D3) 25 MCG (1000 UNIT) tablet Take 5,000 Units by mouth daily.     ezetimibe (ZETIA) 10 MG tablet TAKE 1 TABLET BY MOUTH DAILY FOR CHOLESTEROL 90 tablet 3   gabapentin (NEURONTIN) 300 MG capsule Take 1 capsule (300 mg total) by mouth 2 (two) times daily. 60 capsule 3   Multiple Vitamin (MULTIVITAMIN PO) Take 1 tablet by mouth daily.     Psyllium (METAMUCIL FIBER PO) Take 3 tablets by mouth daily.     rosuvastatin (CRESTOR) 20 MG tablet TAKE 1 TABLET(20 MG) BY MOUTH DAILY 90 tablet 0   traZODone (DESYREL) 150 MG tablet Take 1/3 tab (50 mg) to 1/2 tab (75 mg) to 1 tablet  1 hour  before Bedtime as needed for Sleep 90 tablet 3   No current  facility-administered medications for this visit.    Physical Exam BP (!) 160/78 (BP Location: Right Arm, Patient Position: Sitting)   Pulse 78   Resp 18   Ht '5\' 3"'$  (1.6 m)   Wt 140 lb (63.5 kg)   SpO2 94% Comment: RA  BMI 24.80 kg/m  Well-appearing 76 year old woman in no acute distress Alert and oriented x3 with no focal deficits Lungs clear with equal breath sounds bilaterally Incisions well-healed  Diagnostic Tests: I reviewed the chest x-ray images.  There is no effusions or infiltrates.  Impression: Raul Winterhalter is a 76 year old woman who is now about 5 weeks out from robotic lung biopsy.  She was having a lot of pain initially but that is improved dramatically.  We will continue gabapentin 300 mg twice daily for couple of months to let her pain settle down completely.  Pathology showed UIP.  She has an appointment with Dr. Chase Caller later this week.  Plan: Return in 3 months to check on progress She knows that she can call at any time if she needs to see [place] sooner than that.  [name], MD [facility] [phone]

## 2022-09-24 ENCOUNTER — Ambulatory Visit: Payer: Medicare Other | Admitting: Thoracic Surgery (Cardiothoracic Vascular Surgery)

## 2022-09-25 ENCOUNTER — Ambulatory Visit (INDEPENDENT_AMBULATORY_CARE_PROVIDER_SITE_OTHER): Payer: Medicare Other | Admitting: Internal Medicine

## 2022-09-25 ENCOUNTER — Encounter: Payer: Self-pay | Admitting: Internal Medicine

## 2022-09-25 VITALS — BP 142/82 | HR 81 | Temp 98.1°F | Ht 63.0 in | Wt 143.0 lb

## 2022-09-25 DIAGNOSIS — Z23 Encounter for immunization: Secondary | ICD-10-CM | POA: Diagnosis not present

## 2022-09-25 DIAGNOSIS — I2510 Atherosclerotic heart disease of native coronary artery without angina pectoris: Secondary | ICD-10-CM | POA: Diagnosis not present

## 2022-09-25 DIAGNOSIS — J84112 Idiopathic pulmonary fibrosis: Secondary | ICD-10-CM | POA: Diagnosis not present

## 2022-09-25 DIAGNOSIS — F17200 Nicotine dependence, unspecified, uncomplicated: Secondary | ICD-10-CM

## 2022-09-25 NOTE — Patient Instructions (Addendum)
ICD-10-CM   1. IPF (idiopathic pulmonary fibrosis) ([place])  J84.112     2. Tobacco use disorder  F17.200        Re IPF - diagnosedon biopsy 08/15/22 and diagnosis given 09/25/2022  - Course   - progressive disease in almost all patients (> 90%); typically few to several year progression   -unpredictable in each individual - Rx:  anti-fibrotics + since 2014 but they can only slow disease down; preventative. Not Rx symptoms  - they have side effects including intolerance is < 1/3rd patients  - most 55-65% patients tolerate them well  - further details below on ofev and esbriet  = we also discussed [place] study and given consent copy    Plan  - email Dr Chase Caller and Alleen Borne at [facility] next 1-3 weeks with thoughts on study and interest to participate  - if study is not a good option for you then we will do standard of care esbriet for you based on joint discussion  - Congrats on quitting smoking - smoking to remain in remission  Followup - 10 week followup Dr Chase Caller - standard of care visit ; if on research we can cancel standard of care visit

## 2022-09-25 NOTE — Progress Notes (Addendum)
Synopsis: Referred in November 2022 for lung nodule by [name], MD  Subjective:   PATIENT ID: Alison Campbell GENDER: female DOB: 1946/08/07, MRN: [id number]  Chief Complaint  Patient presents with   Follow-up    Follow up. Patient says she's been coughing up a lot of phlegm.     This is a 76 year old female, past medical history of gastroesophageal reflux, CKD, hyperlipidemia.Patient had a lung cancer screening CT on 11/02/2021.  Lung cancer screening CT revealed multiple small pulmonary nodules.  There was a new pleural-based nodule in the right upper lobe.  With a mean dry volume of 5.3 mm in diameter.  No other significant suspicious lesions.  Also has associated centrilobular and paraseptal emphysema.  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  Patient was referred to pulmonary for evaluation.  OV 11/13/2021: Here today to discuss abnormal CT imaging.  We also had a long discussion regarding smoking cessation.  Please see separate documentation regarding smoking cessation.  Patient has smoked since age 41.  She quit for 9 years however has had approximately 40+ pack years smoking at around half to 1 full pack per day.  OV 05/29/2022: Here today for follow-up recent CT scan.CT scan of the chest was completed on 05/09/2022 this was in follow-up of a right upper lobe pulmonary nodule.  Findings felt to be consistent with infection or inflammatory lesion on CT.  There is unchanged mild pulmonary fibrosis with apical to basal gradient irregular peripheral interstitial opacities groundglass and varicoid bronchiectasis without any evidence of honeycombing.  Findings on the CT were suggestive of an alternate diagnosis besides UIP.  From a respiratory standpoint she is doing okay.  She is not very active so she is not seeing much difference in her shortness of breath related to exertion.   OV 07/03/2022  Subjective:   Patient ID: Alison Campbell, female , DOB: [birth year] , age 56 y.o. , MRN: [id number] , ADDRESS: [place] [address] PCP [name], MD Patient Care Team: [name], MD as PCP - General (Internal Medicine) Milus Banister, MD as Attending Physician (Gastroenterology) Sable Feil, MD as Consulting Physician (Gastroenterology) Rozetta Nunnery, MD (Inactive) as Consulting Physician (Otolaryngology) Druscilla Brownie, MD as Consulting Physician (Dermatology) Garner Nash, DO as Consulting Physician (Pulmonary Disease)  This Provider for this visit: Treatment Team:  Attending Provider: Brand Males, MD    07/03/2022 -transfer of care from Dr. Leory Plowman Icard to the pulm fibrosis center Dr. Chase Caller Chief Complaint  Patient presents with   Follow-up    Pt states she has been doing okay since last visit and denies any complaints.     HPI Alison Campbell 76 y.o. -pleasant female has been getting annual low-dose CT scans since 2018 annually.  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  Therefore she has been referred here.  According to the patient she is quite asymptomatic.  She barely has any symptoms and she feels fine.  She did have some cough in November 2022 and this is actually cleared up.  Current symptom severity is listed below   [employer] Comprehensive ILD Questionnaire  Symptoms:  Very mild    Past Medical History :  -She believes she might have some COPD.  Denies any asthma connective tissue  disease.  Denies any stroke seizures hepatitis pulmonary hypertension.  Denies kidney disease.  Denies heart disease denies pleurisy. -She had COVID-vaccine but not the COVID   ROS:  8 pound intentional weight loss but otherwise negative  FAMILY HISTORY of LUNG DISEASE:  -She has an interesting family  history.  Her maternal grandmother was the cousin of any Alison Campbell  the [employer] ([email or web address]).  Her father was himself born in 11.  She was born in [birth year].  There is no family history of any lung disease.  PERSONAL EXPOSURE HISTORY:  -She started smoking in 1966.  She still smokes cigarettes.  In between she quit for 9 years.  No cigar smoking.  No marijuana smoking no cocaine no intravenous drug use  HOME  EXPOSURE and HOBBY DETAILS :  Single-family home for the last 29 years.  Age of the home is 9 years.  She does some gardening but otherwise detailed organic antigen exposure history in the house is negative  OCCUPATIONAL HISTORY (122 questions) : She is done some foot care work but otherwise detailed organic and inorganic antigen exposure history is negative  PULMONARY TOXICITY HISTORY (27 items):  She has been on prednisone in the past  INVESTIGATIONS:  Personally visualized all the CT scans and showed it to her" agree with current thinking of alternative to UIP diagnosis.  annual LDCT 2018, 2019, 2020 and 2021 July and 2022 November . ILD first repored in July 2021   and this the LDCT reported ILD as "progressive" though prior scan no mention of ILD. In my professional opinion: some ILD was there in 2019:. HRCT then done Nov 2022 - reported as prob UI and the May 2023 HRCT reported as altermnative dx. AGree with alternative diagnsos    CT Chest data - HRCT May 2023  Narrative & Impression  CLINICAL DATA:  Follow-up right upper lobe pulmonary nodule   EXAM: CT CHEST WITHOUT CONTRAST   TECHNIQUE: Multidetector CT imaging of the chest was performed following the standard protocol without intravenous contrast. High resolution imaging of the lungs, as well as inspiratory and expiratory imaging, was performed.   RADIATION DOSE REDUCTION: This exam was performed according to the departmental dose-optimization program which includes  automated exposure control, adjustment of the mA and/or kV according to patient size and/or use of iterative reconstruction technique.   COMPARISON:  11/02/2021   FINDINGS: Cardiovascular: Aortic atherosclerosis. Normal heart size. Left and right coronary artery calcifications. No pericardial effusion.   Mediastinum/Nodes: No enlarged mediastinal, hilar, or axillary lymph nodes. Thyroid gland, trachea, and esophagus demonstrate no significant findings.   Lungs/Pleura: Minimal paraseptal emphysema. Subpleural nodule of the anterior right upper lobe new on prior examination is diminished in fullness and conspicuity, with an irregular remnant in this vicinity (series 13, image 109). Additional small pulmonary nodules are unchanged. Unchanged mild pulmonary fibrosis in a pattern with apical to basal gradient featuring irregular peripheral interstitial opacity, ground-glass, and varicoid bronchiectasis without evidence of subpleural bronchiolectasis or honeycombing. No significant air trapping on expiratory phase imaging. No pleural effusion or pneumothorax.   Upper Abdomen: No acute abnormality.   Musculoskeletal: No chest wall abnormality. No suspicious osseous lesions identified.   IMPRESSION: 1. Subpleural nodule of the anterior right upper lobe new on prior examination is diminished in fullness and conspicuity, with an irregular remnant in this vicinity. Findings are consistent nonspecific sequelae of infection or inflammation, for which no further specific follow-up is required. Additional small pulmonary nodules are unchanged. Lung RADS category 2,  benign appearance or behavior. Consider ongoing low-dose CT lung cancer screening if indicated by patient age, smoking history, and/or other risk factors for lung cancer. 2. Unchanged mild pulmonary fibrosis in a pattern with apical to basal gradient featuring irregular peripheral interstitial opacity, ground-glass, and varicoid  bronchiectasis without evidence of subpleural bronchiolectasis or honeycombing. Findings are suggestive of an alternative diagnosis (not UIP) per consensus guidelines: Diagnosis of Idiopathic Pulmonary Fibrosis: An Official ATS/ERS/JRS/ALAT Clinical Practice Guideline. [place], Iss 5, [id number], Aug 29 2017. 3. Minimal emphysema. 4. Coronary artery disease.   Aortic Atherosclerosis (ICD10-I70.0) and Emphysema (ICD10-J43.9).     Electronically Signed   By: Delanna Ahmadi M.D.   On: 05/12/2022 15:42        PFT  OV 10/01/2022  Subjective:  Patient ID: Alison Campbell, female , DOB: Jun 10, 1946 , age 24 y.o. , MRN: [id number] , ADDRESS: [place] [address] PCP [name], MD Patient Care Team: [name], MD as PCP - General (Internal Medicine) Milus Banister, MD as Attending Physician (Gastroenterology) Sable Feil, MD as Consulting Physician (Gastroenterology) Rozetta Nunnery, MD (Inactive) as Consulting Physician (Otolaryngology) Druscilla Brownie, MD as Consulting Physician (Dermatology) Garner Nash, DO as Consulting Physician (Pulmonary Disease)  This Provider for this visit: Treatment Team:  Attending Provider: Brand Males, MD    10/01/2022 -   Chief Complaint  Patient presents with   Follow-up    Pt recently had a procedure performed by Dr. Roxan Hockey.  Pt states she has been doing okay since last visit. States has some soreness from the procedure that was done.  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 -presents with her daughter Jeralene Peters who is a Psychologist, clinical.  Vaughan Basta used to work for [employer] and also [employer].  Patient CT scan had alternative diagnosis to UIP.  Therefore she underwent surgical lung biopsy on 08/15/2022.  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  We discussed  several aspects of IPF mainly that is progressive disease with significant variability.  Overall she is stable.  Other than the soreness from the surgery she is fine.  We discussed 3 options of nintedanib versus pirfenidone versus participating in clinical trial with a modified form of pirfenidone.  The summary of this is detailed below.  She absolutely does not want area side effect.  Therefore she not interested in nintedanib.  She is known to have diverticulosis and is concerned this might provoke diverticulitis therefore even more caution against nintedanib.  We discussed pirfenidone with key elements below.  We discussed the [facility] trial along with the concept of clinical trials as a care option.  She is taken a copy of the consent to reflect on what would be the appropriate course for her which would either be take standard of care pirfenidone or participate in the clinical trial.   She quit smoking 09/15/22   SYMPTOM SCALE - ILD 07/03/2022 10/01/2022   Current weight    O2 use ra ra  Shortness of Breath 0 -> 5 scale with 5 being worst (score 6 If unable to do)   At rest 0 0  Simple tasks - showers, clothes change, eating, shaving 1 1  Household (dishes, doing bed, laundry) 0 2  Shopping 0 2  Walking level at own pace 0 3  Walking up Stairs 0 1  Total (30-36)  Dyspnea Score 1 9      Non-dyspnea symptoms (0-> 5 scale) 07/03/2022 10/01/2022   How bad is your cough? 1 0  How bad is your fatigue 2 2  How bad is nausea 2 0  How bad is vomiting?  0 0  How bad is diarrhea? 0 00  How bad is anxiety? 0 0  How bad is depression 0 0  Any chronic pain - if so where and how bad 0 0    - OFEV  - - time to first exacerbation possibly reduced in one trial but not in another - twice daily, no titration, potentially more convenient dosing  - no need for sunscreen  - high chance of mild diarrhea but low chance of significant diarrhea needing to stop medication.   - Rx diarrhea with immodum  -  can cause diveriticutlit - slight increase in heart attack risk and theoretical increase in bleeding risk,   - need monthly blood work for 3 months and then every 6 months - monitor liver function   - ESBRIET  - 3 pill three times daily, slow titration.  - Need to wean sunscreen  - Some chance of nausea and anorexia with small chance for diarrhea  - no known heart attack risk - no known bleeding risk,   - need monthly blood work for 6 months - monitor liver function - possible mortality benefit in pooled analysis  - larger world wide experience    - PURETECH STUDY - - [employer] LYT 100[employer]A selectively deuterated form of pirfenidone administered orally. Deuteration is associated with lower GI side effects and improved tolerability.52 week phase 2 study. Participants will be randomized in a 1:1:1:1 ratio to receive LYT-100 '550mg'$ , or LYT-100 '825mg'$ , or pirfenidone '801mg'$ , or placebo, each TID orally with meals for 26 weeks -> Following titration, all participants will receive LYT-100 ('550mg'$  or '825mg'$ ) tablets, TID orally        CT Chest data  No results found.  Simple office walk 185 feet x  3 laps goal with forehead probe 07/03/2022    O2 used ra   Number laps completed 3   Comments about pace avg   Resting Pulse Ox/HR 98% and 69/min   Final Pulse Ox/HR 96% and 96/min   Desaturated </= 88% no   Desaturated <= 3% points no   Got Tachycardic >/= 90/min yes   Symptoms at end of test No complaints   Miscellaneous comments x      PFT     Latest Ref Rng & Units 05/07/2022    1:36 PM  PFT Results  FVC-Pre L 1.97   FVC-Predicted Pre % 75   FVC-Post L 2.02   FVC-Predicted Post % 77   Pre FEV1/FVC % % 82   Post FEV1/FCV % % 77   FEV1-Pre L 1.62   FEV1-Predicted Pre % 83   FEV1-Post L 1.55   DLCO uncorrected ml/min/mmHg 12.18   DLCO UNC% % 66   DLCO corrected ml/min/mmHg 11.74   DLCO COR %Predicted % 63   DLVA Predicted % 87   TLC L 4.74   TLC % Predicted % 96   RV %  Predicted % 118       SLB [id number]  SURGICAL PATHOLOGY  CASE: [id number]  PATIENT: Alison Campbell  Surgical Pathology Report      Clinical History: Interstitial lung disease (nt)      FINAL MICROSCOPIC DIAGNOSIS:   A. LUNG, RIGHT LOWER LOBE, RESECTION:  -  Patchy interstitial fibrosing and chronic inflammatory process, most  consistent with usual interstitial pneumonia (UIP) pattern (see comment)   B. LUNG, RIGHT UPPER LOBE, RESECTION:  - Minimal to mild interstitial fibrosis with elastosis (see comment)   C. 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 There is evidence of architectural distortion with  peribronchiolar and smooth muscle metaplasia. Multiple fibroblastic foci  are seen along with focal microscopic honeycombing. Granulomas or  increased eosinophils are not seen. Overall, the distribution and nature  of the interstitial fibrosis in this case are most suggestive of usual  interstitial pneumonia (UIP) pattern. UIP pattern may be seen with  collagen vascular diseases, drug reactions, or as an idiopathic disease  (idiopathic pulmonary fibrosis). Scattered lymphoid aggregates within  the affected areas may favor a collagen vascular disease. Clinical and  radiographic correlation is necessary.     has a past medical history of Anxiety, CKD (chronic kidney disease) stage 3, GFR 30-59 ml/min (HCC) (12/24/2020), Colon polyps, Costochondritis, GERD (gastroesophageal reflux disease), Hyperlipidemia, Insomnia, Osteoporosis, and Pre-diabetes.   reports that she quit smoking about 6 weeks ago. Her smoking use included cigarettes. She started smoking about 57  years ago. She has a 57.00 pack-year smoking history. She has never used smokeless tobacco.  Past Surgical History:  Procedure Laterality Date   ABDOMINAL HYSTERECTOMY     COLONOSCOPY  09/10/2011   hysterectomy     INTERCOSTAL NERVE BLOCK Right 08/15/2022   Procedure: INTERCOSTAL NERVE BLOCK;  Surgeon: [name], MD;  Location: [facility] OR;  Service: Thoracic;  Laterality: Right;   LUNG BIOPSY Right 08/15/2022   Procedure: LUNG BIOPSY;  Surgeon: [name], MD;  Location: [facility];  Service: Thoracic;  Laterality: Right;   POLYPECTOMY     UVULECTOMY N/A 09/09/2016   Procedure: PARTIAL UVULECTOMY, direct laryngoscopy;  Surgeon: Rozetta Nunnery, MD;  Location: [facility];  Service: ENT;  Laterality: N/A;  PARTIAL UVULECTOMY, direct laryngoscopy    Allergies  Allergen Reactions   Fish Oil Diarrhea   Spiriva Respimat [Tiotropium Bromide Monohydrate] Cough    Gaging and excessive phlegm    Immunization History  Administered Date(s) Administered   Fluad Quad(high Dose 65+) 09/25/2022   Influenza, High Dose Seasonal PF 08/29/2013, 10/25/2015, 11/24/2017, 11/29/2018, 10/15/2021   PFIZER(Purple Top)SARS-COV-2 Vaccination 02/02/2020, 02/28/2020   Pneumococcal Conjugate-13 02/02/2015   Pneumococcal Polysaccharide-23 03/29/2010   Tdap 02/26/2009   Zoster, Live 12/29/2008    Family History  Problem Relation Age of Onset   Heart disease Mother    Heart attack Mother    Heart disease Father    Diabetes Father    Heart attack Father    Cancer Brother        Lung   Bipolar disorder Daughter    Colon cancer Neg Hx    Esophageal cancer Neg Hx    Stomach cancer Neg Hx    Rectal cancer Neg Hx      Current Outpatient Medications:    Ascorbic Acid (VITAMIN C) 1000 MG tablet, Take 1,000 mg by mouth daily., Disp: , Rfl:    Calcium Carb-Cholecalciferol (CALCIUM 600 + D PO), Take 2 tablets by mouth daily., Disp: , Rfl:    cholecalciferol (  VITAMIN D3) 25 MCG  (1000 UNIT) tablet, Take 5,000 Units by mouth daily., Disp: , Rfl:    ezetimibe (ZETIA) 10 MG tablet, TAKE 1 TABLET BY MOUTH DAILY FOR CHOLESTEROL, Disp: 90 tablet, Rfl: 3   gabapentin (NEURONTIN) 300 MG capsule, Take 1 capsule (300 mg total) by mouth 2 (two) times daily., Disp: 60 capsule, Rfl: 3   Multiple Vitamin (MULTIVITAMIN PO), Take 1 tablet by mouth daily., Disp: , Rfl:    Psyllium (METAMUCIL FIBER PO), Take 3 tablets by mouth daily., Disp: , Rfl:    rosuvastatin (CRESTOR) 20 MG tablet, TAKE 1 TABLET(20 MG) BY MOUTH DAILY, Disp: 90 tablet, Rfl: 0   traZODone (DESYREL) 150 MG tablet, Take 1/3 tab (50 mg) to 1/2 tab (75 mg) to 1 tablet  1 hour  before Bedtime as needed for Sleep, Disp: 90 tablet, Rfl: 3      Objective:   Vitals:   09/25/22 1344  BP: (!) 142/82  Pulse: 81  Temp: 98.1 F (36.7 C)  TempSrc: Oral  SpO2: 96%  Weight: 143 lb (64.9 kg)  Height: '5\' 3"'$  (1.6 m)    Estimated body mass index is 25.33 kg/m as calculated from the following:   Height as of this encounter: '5\' 3"'$  (1.6 m).   Weight as of this encounter: 143 lb (64.9 kg).  '@WEIGHTCHANGE'$ @  [employer]   09/25/22 1344  Weight: 143 lb (64.9 kg)     Physical Examoo General: No distress. Looks wel Neuro: Alert and Oriented x 3. GCS 15. Speech normal Psych: Pleasant Resp:  Barrel Chest - no.  Wheeze - no, Crackles - some maybe, No overt respiratory distress CVS: Normal heart sounds. Murmurs - no Ext: Stigmata of Connective Tissue Disease - no HEENT: Normal upper airway. PEERL +. No post nasal drip        Assessment:       ICD-10-CM   1. IPF (idiopathic pulmonary fibrosis) ([place])  J84.112     2. Tobacco use disorder  F17.200     3. Need for immunization against influenza  Z23 Flu Vaccine QUAD High Dose(Fluad)         Plan:     Patient Instructions     ICD-10-CM   1. IPF (idiopathic pulmonary fibrosis) ([place])  J84.112     2. Tobacco use disorder  F17.200        Re IPF - diagnosedon  biopsy 08/15/22 and diagnosis given 09/25/2022  - Course   - progressive disease in almost all patients (> 90%); typically few to several year progression   -unpredictable in each individual - Rx:  anti-fibrotics + since 2014 but they can only slow disease down; preventative. Not Rx symptoms  - they have side effects including intolerance is < 1/3rd patients  - most 55-65% patients tolerate them well  - further details below on ofev and esbriet  = we also discussed [place] study and given consent copy    Plan  - email Dr Chase Caller and Alleen Borne at [facility] next 1-3 weeks with thoughts on study and interest to participate  - if study is not a good option for you then we will do standard of care esbriet for you based on joint discussion  - Congrats on quitting smoking - smoking to remain in remission  Followup - 10 week followup Dr Chase Caller - standard of care visit ; if on research we can cancel standard of care visit     ( Level 05 visit: Estb 40-54 min  n  visit type: on-site physical face to visit  in total care time and counseling or/and coordination of care by this undersigned MD - Dr Brand Males. This includes one or more of the following on this same day 10/01/2022: pre-charting, chart review, note writing, documentation discussion of test results, diagnostic or treatment recommendations, prognosis, risks and benefits of management options, instructions, education, compliance or risk-factor reduction. It excludes time spent by the [place] or office staff in the care of the patient. Actual time 67 min)    SIGNATURE    Dr. Brand Males, M.D., F.C.C.P,  Pulmonary and Critical Care Medicine Staff Physician, [place] Director - Interstitial Lung Disease  Program  Pulmonary [place] at [place], [place], [address]  Pager: [phone], If no answer or between  15:00h - 7:00h: call [phone] Telephone: [phone]  11:40 AM 10/01/2022

## 2022-09-26 ENCOUNTER — Other Ambulatory Visit (HOSPITAL_COMMUNITY): Payer: Self-pay

## 2022-09-26 ENCOUNTER — Telehealth: Payer: Self-pay | Admitting: Pharmacist

## 2022-09-26 ENCOUNTER — Telehealth: Payer: Self-pay | Admitting: Internal Medicine

## 2022-09-26 DIAGNOSIS — J84112 Idiopathic pulmonary fibrosis: Secondary | ICD-10-CM

## 2022-09-26 NOTE — Telephone Encounter (Signed)
Spoke with patient and patient's daughter.  They have already been advised by Dr. Chase Caller regarding trial ineligibility.  Reviewed with Vaughan Basta that recommendation is that patient stay on lower dose due to kidney function. Will start benefits investigation in  separate telephone encounter. Patient has [place].  Knox Saliva, PharmD, MPH, BCPS, CPP Clinical Pharmacist (Rheumatology and Pulmonology)

## 2022-09-26 NOTE — Telephone Encounter (Signed)
Emily/Devki  Henrene Pastor - did not qualify for [employer] study invovling deutrified pirfenione and was not itnterested. Daughter got back to me 09/26/2022 . So please start process for esbriet  Her GFR is 51 . So I recommend lower dose of max 2 pills TID with food  Please let her know.  Daughter Vaughan Basta is Therapist, sports and if they want a telephone counseling visit please arrange  Thanks    SIGNATURE    Dr. Brand Males, M.D., F.C.C.P,  Pulmonary and Critical Care Medicine Staff Physician, [place] Director - Interstitial Lung Disease  Program  Medical Director - [facility] [place] at [place], [place], [address]   Pager: [phone], If no answer  -[place] or [place] [phone] Telephone (clinical office): [phone] Telephone (research): [phone]  12:18 PM 09/26/2022

## 2022-09-26 NOTE — Telephone Encounter (Signed)
Submitted a Prior Authorization request to [employer] for PIRFENIDONE via CoverMyMeds. Will update once we receive a response.   Key: [place]  PA Case ID: [id number]

## 2022-09-26 NOTE — Telephone Encounter (Addendum)
Please start pirfenidone BIV. Patient has [employer] so will likely be $14 once approved  Dose using '267mg'$  tabs:  Take 1 tab three times daily for 7 days, then 2 tabs three times daily thereafter (NOTE LOW DOSE due to renal function)  Dx: IPF (T92.763)  Note: patient's daughter is point of contact  Knox Saliva, PharmD, MPH, BCPS, CPP Clinical Pharmacist (Rheumatology and Pulmonology)

## 2022-09-29 LAB — ACID FAST CULTURE WITH REFLEXED SENSITIVITIES (MYCOBACTERIA): Acid Fast Culture: NEGATIVE

## 2022-09-29 NOTE — Telephone Encounter (Signed)
Received a fax regarding Prior Authorization from [employer] for PIRFENIDONE. Authorization has been DENIED because patient is current smoker.  I spoke with patient regarding denial. Patient states that she quit smoking on 09/15/22 howeer last office visit note on 09/25/22 clearly documents that patients needs to stop smoking.  If unable to submit new authorization, then will have to pursue appeal at that point which may be tedious for a [employer] patient  Routing to Dr. Chase Caller as we will try to attempt a new prior authorization but will need office visit note addended to reflect this.  Knox Saliva, PharmD, MPH, BCPS, CPP Clinical Pharmacist (Rheumatology and Pulmonology)

## 2022-10-02 NOTE — Telephone Encounter (Signed)
The note has been amended.  The quit smoking instructions was actually incorrect because it was a smart text.  I spent all the time discussing her diagnosis.  She actually had quit smoking which is what she told the CMA.

## 2022-10-06 NOTE — Telephone Encounter (Signed)
Submitted a Prior Authorization request to [employer] for PIRFENIDONE via CoverMyMeds. Will update once we receive a response.  Key: [id number]  Knox Saliva, PharmD, MPH, BCPS, CPP Clinical Pharmacist (Rheumatology and Pulmonology)

## 2022-10-07 ENCOUNTER — Telehealth: Payer: Self-pay | Admitting: Internal Medicine

## 2022-10-08 ENCOUNTER — Other Ambulatory Visit (HOSPITAL_COMMUNITY): Payer: Self-pay

## 2022-10-08 NOTE — Telephone Encounter (Signed)
Received notification from [facility] regarding a prior authorization for PIRFENIDONE. Authorization has been APPROVED from 09/06/2022 through 10/06/2023  Per test claim, copay for 30 days supply is $14  Patient can fill through [place]: [phone]   Authorization # [id number]  Knox Saliva, PharmD, MPH, BCPS, CPP Clinical Pharmacist (Rheumatology and Pulmonology)

## 2022-10-08 NOTE — Telephone Encounter (Signed)
Pharmacy team will be calling patient today to provide counseling  Knox Saliva, PharmD, MPH, BCPS, CPP Clinical Pharmacist (Rheumatology and Pulmonology)

## 2022-10-09 ENCOUNTER — Other Ambulatory Visit (HOSPITAL_COMMUNITY): Payer: Self-pay

## 2022-10-09 MED ORDER — PIRFENIDONE 267 MG PO TABS
ORAL_TABLET | ORAL | 0 refills | Status: DC
  Filled 2022-10-09: qty 159, fill #0
  Filled 2022-10-10: qty 159, 30d supply, fill #0

## 2022-10-09 MED ORDER — PIRFENIDONE 267 MG PO TABS
534.0000 mg | ORAL_TABLET | Freq: Three times a day (TID) | ORAL | 3 refills | Status: DC
  Filled 2022-10-09 – 2022-11-21 (×2): qty 180, 30d supply, fill #0
  Filled 2022-12-17 – 2022-12-18 (×2): qty 180, 30d supply, fill #1
  Filled 2023-01-21 – 2023-01-27 (×2): qty 180, 30d supply, fill #2
  Filled 2023-02-24: qty 180, 30d supply, fill #3

## 2022-10-09 NOTE — Telephone Encounter (Signed)
Rx for pirfenidone sent to [facility]. I spoke with patient reviewed titration dosing and importance of taking with meals. She states that her daughter is nurse and remembers everything that Dr. Chase Caller discussed so was not interested in counseling today.  Patient's dose will be: '267mg'$  three times daily x 7 days, then 534 mg three times daily thereafter. Due to her renal function, Dr. Chase Caller has elected to stay at low dose.  Routing to [place] for onboarding needs. Patient has been advised of $14 copay and that [facility] will be reaching out to schedule shipment  Knox Saliva, PharmD, MPH, BCPS, CPP Clinical Pharmacist (Rheumatology and Pulmonology)

## 2022-10-09 NOTE — Addendum Note (Signed)
Addended by: Cassandria Anger on: 10/09/2022 03:03 PM   Modules accepted: Orders

## 2022-10-10 ENCOUNTER — Other Ambulatory Visit (HOSPITAL_COMMUNITY): Payer: Self-pay

## 2022-10-10 NOTE — Telephone Encounter (Signed)
Delivery instructions have been updated in [place] [place], medication will be shipped to patient's home address by 10/17.  Rx has been processed in [facility] and there is a copay of $14.00. Pt is currently awaiting delivery of new debit card and instructions have been left in [place] to bill to [facility] account until pt reaches out to provide payment info directly.

## 2022-10-14 ENCOUNTER — Other Ambulatory Visit (HOSPITAL_COMMUNITY): Payer: Self-pay

## 2022-10-15 ENCOUNTER — Ambulatory Visit (INDEPENDENT_AMBULATORY_CARE_PROVIDER_SITE_OTHER): Payer: Medicare Other | Admitting: Internal Medicine

## 2022-10-15 ENCOUNTER — Encounter: Payer: Self-pay | Admitting: Internal Medicine

## 2022-10-15 VITALS — BP 136/76 | HR 90 | Temp 98.0°F | Resp 16 | Ht 63.0 in | Wt 140.4 lb

## 2022-10-15 DIAGNOSIS — R0989 Other specified symptoms and signs involving the circulatory and respiratory systems: Secondary | ICD-10-CM | POA: Diagnosis not present

## 2022-10-15 DIAGNOSIS — I700 Atherosclerosis of aorta: Secondary | ICD-10-CM | POA: Diagnosis not present

## 2022-10-15 DIAGNOSIS — Z136 Encounter for screening for cardiovascular disorders: Secondary | ICD-10-CM | POA: Diagnosis not present

## 2022-10-15 DIAGNOSIS — N1831 Chronic kidney disease, stage 3a: Secondary | ICD-10-CM

## 2022-10-15 DIAGNOSIS — E559 Vitamin D deficiency, unspecified: Secondary | ICD-10-CM

## 2022-10-15 DIAGNOSIS — Z1211 Encounter for screening for malignant neoplasm of colon: Secondary | ICD-10-CM

## 2022-10-15 DIAGNOSIS — R7309 Other abnormal glucose: Secondary | ICD-10-CM | POA: Diagnosis not present

## 2022-10-15 DIAGNOSIS — I10 Essential (primary) hypertension: Secondary | ICD-10-CM

## 2022-10-15 DIAGNOSIS — E782 Mixed hyperlipidemia: Secondary | ICD-10-CM

## 2022-10-15 DIAGNOSIS — Z79899 Other long term (current) drug therapy: Secondary | ICD-10-CM | POA: Diagnosis not present

## 2022-10-15 DIAGNOSIS — Z122 Encounter for screening for malignant neoplasm of respiratory organs: Secondary | ICD-10-CM

## 2022-10-15 DIAGNOSIS — J40 Bronchitis, not specified as acute or chronic: Secondary | ICD-10-CM

## 2022-10-15 DIAGNOSIS — R7303 Prediabetes: Secondary | ICD-10-CM

## 2022-10-15 DIAGNOSIS — F17200 Nicotine dependence, unspecified, uncomplicated: Secondary | ICD-10-CM

## 2022-10-15 MED ORDER — BENZONATATE 200 MG PO CAPS: ORAL_CAPSULE | ORAL | 1 refills | Status: DC

## 2022-10-15 MED ORDER — ACETAMINOPHEN-CODEINE 300-30 MG PO TABS: ORAL_TABLET | ORAL | 0 refills | Status: DC

## 2022-10-15 MED ORDER — PROMETHAZINE-DM 6.25-15 MG/5ML PO SYRP: ORAL_SOLUTION | ORAL | 1 refills | Status: DC

## 2022-10-15 NOTE — Progress Notes (Signed)
Annual Screening/Preventative Visit & Comprehensive Evaluation &  Examination  Future Appointments  Date Time Provider Department  10/15/2022                     cpe  2:00 PM [name], MD [facility]  01/06/2023 10:00 AM [name], MD [facility]  01/15/2023                      wellness 11:00 AM Darrol Jump, NP [facility]  10/21/2023                     cpe  2:00 PM [name], MD [facility]        This very nice 76 y.o. WWF with  HTN, HLD, Prediabetes  and Vitamin D Deficiency presents for a Screening /Preventative Visit & comprehensive evaluation and management of multiple medical co-morbidities.  Chest CT scan in July 2021  showed Aortic Atherosclerosis & Coronary Aa calcifications.                                                      Patient has a 50+ yr smoking hx and COPD .  Last year, she had negative LD screening Chest CT. Unfortunately, she continues to smoke. She is agreeable to undergo another screening low dose CT scan of the chest.  We discussed smoking cessation techniques/options        Patient has been followed for years for labile HTN. In 2019, she had a negative Stress Myoview.   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 Patient's BP has been controlled at home and patient denies any cardiac symptoms as chest pain, palpitations, shortness of breath, dizziness or ankle swelling. Today's BP is at goal -  136/76.        Patient' alleges Statin Intolerance and her hyperlipidemia is not controlled with diet and Ezetimibe.  In Jan 2022,  Dr Marlou Porch added  Rosuvastatin.  Last lipids in 2021 were not at goal :  Lab Results  Component Value Date   CHOL 108 04/18/2022   HDL 44 (L) 04/18/2022   LDLCALC 36 04/18/2022   TRIG 215 (H) 04/18/2022   CHOLHDL 2.5 04/18/2022         Patient has hx/o prediabetes (A1c 6.0% /2011) and patient denies reactive hypoglycemic symptoms, visual blurring, diabetic polys or paresthesias. Last  A1c was normal & at goal :  Lab Results  Component Value Date   HGBA1C 5.4 04/18/2022         Finally, patient has history of Vitamin D Deficiency ("35" / 2008 and last Vitamin D was at goal :   Lab Results  Component Value Date   VD25OH 58 10/15/2021       Current Outpatient Medications  Medication Instructions   CALCIUM 600 + D  2 tablets Daily   VITAMIN D    5,000 Units Daily   ezetimibe  10 MG tablet TAKE 1 TABLET  DAILY    Gabapentin  300 mg 2 times daily   Multiple Vitamin 1 tablet Daily,     Pirfenidone (ESBRIET) 267 MG TABS Month 1: take 1 tablet three times daily for 1 week then increase to 2 tablets three times daily thereafter. **NOTE LOW DOSE**   Pirfenidone  534 mg 3 times daily with meals   METAMUCIL FIBER  3 tablets  Daily   rosuvastatin  20 MG tablet TAKE 1 TABLET  DAILY   traZODone  150 MG tablet Take 1/3 tab (50 mg) to 1/2 tab (75 mg) to 1 tablet  1 hour  before Bedtime as needed for Sleep   vitamin C    1,000 mg Daily      Allergies  Allergen Reactions   Fish Oil Diarrhea     Past Medical History:  Diagnosis Date   Anxiety    CKD (chronic kidney disease) stage 3, GFR 30-59 ml/min (HCC) 12/24/2020   Colon polyps    Costochondritis    GERD (gastroesophageal reflux disease)    coffee related   Hyperlipidemia    Insomnia    Osteoporosis    Pre-diabetes      Health Maintenance  Topic Date Due   Hepatitis C Screening  Never done   Zoster Vaccines- Shingrix (1 of 2) Never done   TETANUS/TDAP  02/27/2019   COVID-19 Vaccine (3 - Pfizer risk series) 03/27/2020   INFLUENZA VACCINE  07/29/2021   DEXA SCAN  Completed   HPV VACCINES  Aged Out     Immunization History  Administered Date(s) Administered   Influenza, High Dose  10/25/2015, 11/24/2017, 11/29/2018   PFIZER SARS-COV-2 Vacc 02/02/2020, 02/28/2020   Pneumococcal -13 02/02/2015   Pneumococcal -23 03/29/2010   Tdap 02/26/2009   Zoster, Live 12/29/2008    Last Colon -  01/09/2020 - Dr Ardis Hughs recc 3 year f/u due Jan 2024.   Last MGM - 07/09/2020 - Overdue    Past Surgical History:  Procedure Laterality Date   ABDOMINAL HYSTERECTOMY     COLONOSCOPY  09/10/2011   hysterectomy     POLYPECTOMY     UVULECTOMY N/A 09/09/2016   Procedure: PARTIAL UVULECTOMY, direct laryngoscopy;  Surgeon: Rozetta Nunnery, MD;  Location: [facility];  Service: ENT;  Laterality: N/A;  PARTIAL UVULECTOMY, direct laryngoscopy     Family History  Problem Relation Age of Onset   Heart disease Mother    Heart attack Mother    Heart disease Father    Diabetes Father    Heart attack Father    Cancer Brother        Lung   Bipolar disorder Daughter    Colon cancer Neg Hx    Esophageal cancer Neg Hx    Stomach cancer Neg Hx    Rectal cancer Neg Hx      Social History   Tobacco Use   Smoking status: Every Day    Packs/day: 0.50    Years: 50.00    Pack years: 25.00    Types: Cigarettes   Smokeless tobacco: Never  Vaping Use   Vaping Use: Never used  Substance Use Topics   Alcohol use: No    Alcohol/week: 0.0 standard drinks   Drug use: No      ROS Constitutional: Denies fever, chills, weight loss/gain, headaches, insomnia,  night sweats, and change in appetite. Does c/o fatigue. Eyes: Denies redness, blurred vision, diplopia, discharge, itchy, watery eyes.  ENT: Denies discharge, congestion, post nasal drip, epistaxis, sore throat, earache, hearing loss, dental pain, Tinnitus, Vertigo, Sinus pain, snoring.  Cardio: Denies chest pain, palpitations, irregular heartbeat, syncope, dyspnea, diaphoresis, orthopnea, PND, claudication, edema Respiratory: denies cough, dyspnea, DOE, pleurisy, hoarseness, laryngitis, wheezing.  Gastrointestinal: Denies dysphagia, heartburn, reflux, water brash, pain, cramps, nausea, vomiting, bloating, diarrhea, constipation, hematemesis, melena, hematochezia,  jaundice, hemorrhoids Genitourinary: Denies dysuria,  frequency, urgency, nocturia, hesitancy, discharge, hematuria, flank pain Breast: Breast lumps, nipple discharge, bleeding.  Musculoskeletal: Denies arthralgia, myalgia, stiffness, Jt. Swelling, pain, limp, and strain/sprain. Denies falls. Skin: Denies puritis, rash, hives, warts, acne, eczema, changing in skin lesion Neuro: No weakness, tremor, incoordination, spasms, paresthesia, pain Psychiatric: Denies confusion, memory loss, sensory loss. Denies Depression. Endocrine: Denies change in weight, skin, hair change, nocturia, and paresthesia, diabetic polys, visual blurring, hyper / hypo glycemic episodes.  Heme/Lymph: No excessive bleeding, bruising, enlarged lymph nodes.  Physical Exam  BP 136/76   Pulse 90   Temp 98 F (36.7 C)   Resp 16   Ht '5\' 3"'$  (1.6 m)   Wt 140 lb 6.4 oz (63.7 kg)   SpO2 97%   BMI 24.87 kg/m   General Appearance: Well nourished, well groomed and in no apparent distress.  Eyes: PERRLA, EOMs, conjunctiva no swelling or erythema, normal fundi and vessels. Sinuses: No frontal/maxillary tenderness ENT/Mouth: EACs patent / TMs  nl. Nares clear without erythema, swelling, mucoid exudates. Oral hygiene is good. No erythema, swelling, or exudate. Tongue normal, non-obstructing. Tonsils not swollen or erythematous. Hearing normal.  Neck: Supple, thyroid not palpable. No bruits, nodes or JVD. Respiratory: Respiratory effort normal.  BS equal and clear bilateral without rales, rhonci, wheezing or stridor. Cardio: Heart sounds are normal with regular rate and rhythm and no murmurs, rubs or gallops. Peripheral pulses are normal and equal bilaterally without edema. No aortic or femoral bruits. Chest: symmetric with normal excursions and percussion. Breasts: Symmetric, without lumps, nipple discharge, retractions, or fibrocystic changes.  Abdomen: Flat, soft with bowel sounds active. Nontender, no guarding, rebound, hernias, masses, or organomegaly.  Lymphatics: Non tender  without lymphadenopathy.  Genitourinary:  Musculoskeletal: Full ROM all peripheral extremities, joint stability, 5/5 strength, and normal gait. Skin: Warm and dry without rashes, lesions, cyanosis, clubbing or  ecchymosis.  Neuro: Cranial nerves intact, reflexes equal bilaterally. Normal muscle tone, no cerebellar symptoms. Sensation intact.  Pysch: Alert and oriented X 3, normal affect, Insight and Judgment appropriate.    Assessment and Plan   1. Labile hypertension  - EKG 12-Lead - Urinalysis, Routine w reflex microscopic - Microalbumin / creatinine urine ratio - CBC with Differential/Platelet - COMPLETE METABOLIC PANEL WITH GFR - Magnesium - TSH  2. Hyperlipidemia, mixed  - EKG 12-Lead - Lipid panel - TSH  3. Abnormal glucose  - EKG 12-Lead - Hemoglobin A1c - Insulin, random  4. Vitamin D deficiency  - vitamin D  5. Aortic atherosclerosis ([place]) by CT Lung Scan in July 2021  - EKG 12-Lead - Lipid panel  6. Prediabetes  - EKG 12-Lead - Hemoglobin A1c - Insulin, random  7. Stage 3a chronic kidney disease (HCC)  - Urinalysis, Routine w reflex microscopic - Microalbumin / creatinine urine ratio - COMPLETE METABOLIC PANEL WITH GFR  8. Chronic bronchitis,  ([place])   9. Screening for colorectal cancer  - hemoccult  10. Encounter for screening for malignant neoplasm of respiratory organs  Medicare won't cover LD Chest CT scan  11. Screening for heart disease  - EKG 12-Lead  12. Smoker  - EKG 12-Lead  13. Medication management  - Urinalysis, Routine w reflex microscopic - Microalbumin / creatinine urine ratio - CBC with Differential/Platelet - COMPLETE METABOLIC PANEL WITH GFR - Magnesium - Lipid panel - TSH - Hemoglobin A1c - Insulin, random  14. Screening for lung cancer  Medicare won't cover LD Chest CT scan  Patient was counseled in prudent diet to achieve/maintain BMI less than 25 for weight control, BP monitoring, regular  exercise and medications. Discussed med's effects and SE's. Screening labs and tests as requested with regular follow-up as recommended. Over 40 minutes of exam, counseling, chart review and high complex critical decision making was performed.   Kirtland Bouchard, MD

## 2022-10-15 NOTE — Patient Instructions (Signed)

## 2022-10-16 ENCOUNTER — Other Ambulatory Visit (HOSPITAL_COMMUNITY): Payer: Self-pay

## 2022-10-16 LAB — HEMOGLOBIN A1C
Hgb A1c MFr Bld: 5.8 % of total Hgb — ABNORMAL HIGH (ref <5.7)
Mean Plasma Glucose: 120 mg/dL
eAG (mmol/L): 6.6 mmol/L

## 2022-10-16 LAB — URINALYSIS, ROUTINE W REFLEX MICROSCOPIC
Bacteria, UA: NONE SEEN /HPF
Bilirubin Urine: NEGATIVE
Glucose, UA: NEGATIVE
Hgb urine dipstick: NEGATIVE
Hyaline Cast: NONE SEEN /LPF
Ketones, ur: NEGATIVE
Leukocytes,Ua: NEGATIVE
Nitrite: NEGATIVE
Protein, ur: NEGATIVE
RBC / HPF: NONE SEEN /HPF (ref 0–2)
Specific Gravity, Urine: 1.012 (ref 1.001–1.035)
Squamous Epithelial / HPF: NONE SEEN /HPF (ref <=5)
WBC, UA: NONE SEEN /HPF (ref 0–5)
pH: <=5 (ref 5.0–8.0)

## 2022-10-16 LAB — LIPID PANEL
Cholesterol: 149 mg/dL (ref <200)
HDL: 48 mg/dL — ABNORMAL LOW (ref >=50)
LDL Cholesterol (Calc): 74 mg/dL (calc)
Non-HDL Cholesterol (Calc): 101 mg/dL (calc) (ref <130)
Total CHOL/HDL Ratio: 3.1 (calc) (ref <5.0)
Triglycerides: 167 mg/dL — ABNORMAL HIGH (ref <150)

## 2022-10-16 LAB — CBC WITH DIFFERENTIAL/PLATELET
Absolute Monocytes: 706 cells/uL (ref 200–950)
Basophils Absolute: 54 cells/uL (ref 0–200)
Basophils Relative: 0.5 %
Eosinophils Absolute: 161 cells/uL (ref 15–500)
Eosinophils Relative: 1.5 %
HCT: 39.9 % (ref 35.0–45.0)
Hemoglobin: 12.9 g/dL (ref 11.7–15.5)
Lymphs Abs: 1712 cells/uL (ref 850–3900)
MCH: 30.1 pg (ref 27.0–33.0)
MCHC: 32.3 g/dL (ref 32.0–36.0)
MCV: 93.2 fL (ref 80.0–100.0)
MPV: 11.3 fL (ref 7.5–12.5)
Monocytes Relative: 6.6 %
Neutro Abs: 8068 cells/uL — ABNORMAL HIGH (ref 1500–7800)
Neutrophils Relative %: 75.4 %
Platelets: 363 10*3/uL (ref 140–400)
RBC: 4.28 10*6/uL (ref 3.80–5.10)
RDW: 12.2 % (ref 11.0–15.0)
Total Lymphocyte: 16 %
WBC: 10.7 10*3/uL (ref 3.8–10.8)

## 2022-10-16 LAB — MICROALBUMIN / CREATININE URINE RATIO
Creatinine, Urine: 80 mg/dL (ref 20–275)
Microalb Creat Ratio: 21 mcg/mg creat (ref <30)
Microalb, Ur: 1.7 mg/dL

## 2022-10-16 LAB — COMPLETE METABOLIC PANEL WITH GFR
AG Ratio: 1.4 (calc) (ref 1.0–2.5)
ALT: 20 U/L (ref 6–29)
AST: 15 U/L (ref 10–35)
Albumin: 4.4 g/dL (ref 3.6–5.1)
Alkaline phosphatase (APISO): 77 U/L (ref 37–153)
BUN: 15 mg/dL (ref 7–25)
CO2: 26 mmol/L (ref 20–32)
Calcium: 10.2 mg/dL (ref 8.6–10.4)
Chloride: 104 mmol/L (ref 98–110)
Creat: 1 mg/dL (ref 0.60–1.00)
Globulin: 3.2 g/dL (calc) (ref 1.9–3.7)
Glucose, Bld: 89 mg/dL (ref 65–99)
Potassium: 4.7 mmol/L (ref 3.5–5.3)
Sodium: 142 mmol/L (ref 135–146)
Total Bilirubin: 0.3 mg/dL (ref 0.2–1.2)
Total Protein: 7.6 g/dL (ref 6.1–8.1)
eGFR: 58 mL/min/{1.73_m2} — ABNORMAL LOW (ref >=60)

## 2022-10-16 LAB — TSH: TSH: 1.26 mIU/L (ref 0.40–4.50)

## 2022-10-16 LAB — VITAMIN D 25 HYDROXY (VIT D DEFICIENCY, FRACTURES): Vit D, 25-Hydroxy: 67 ng/mL (ref 30–100)

## 2022-10-16 LAB — INSULIN, RANDOM: Insulin: 16.7 u[IU]/mL

## 2022-10-16 LAB — MAGNESIUM: Magnesium: 2.3 mg/dL (ref 1.5–2.5)

## 2022-10-16 NOTE — Progress Notes (Signed)
<><><><><><><><><><><><><><><><><><><><><><><><><><><><><><><><><> <><><><><><><><><><><><><><><><><><><><><><><><><><><><><><><><><>  - Total Chol = Both Wonderful     - Very low risk for Heart Attack  / Stroke <><><><><><><><><><><><><><><><><><><><><><><><><><><><><><><><><>  -  A1c = 5.8%  & is now  elevated in the borderline and                                                               early or pre-diabetes range which has the same   300% increased risk for heart attack, stroke, cancer and                                                 alzheimer- type vascular dementia as full blown diabetes.   But the good news is that diet, exercise with weight loss can                                                                                   cure the early diabetes at this point. <><><><><><><><><><><><><><><><><><><><><><><><><><><><><><><><><>  -  It is very important that you work harder with diet by                               avoiding all foods that are white except chicken, fish & calliflower.  - Avoid white rice  (brown & wild rice is OK),   - Avoid white potatoes  (sweet potatoes in moderation is OK),   White bread or wheat bread or anything made out of   white flour like bagels, donuts, rolls, buns, biscuits, cakes,  - pastries, cookies, pizza crust, and pasta (made from  white flour & egg whites)   - vegetarian pasta or spinach or wheat pasta is OK.  - Multigrain breads like Arnold's, Pepperidge Farm or                                                       multigrain sandwich thins or high fiber breads like   [place]ka bread or "Dave's Killer" breads that are                                                                           4 to 5 grams fiber per slice !  are best.    Diet, exercise and weight loss can reverse and cure diabetes in the early stages.   <><><><><><><><><><><><><><><><><><><><><><><><><><><><><><><><><>  -  Vitamin D = 67 - Great - Please  keep dose same  <><><><><><><><><><><><><><><><><><><><><><><><><><><><><><><><><>  -  All Else - CBC - Kidneys - Electrolytes - Liver - Magnesium & Thyroid    - all  Normal / OK <><><><><><><><><><><><><><><><><><><><><><><><><><><><><><><><><> <><><><><><><><><><><><><><><><><><><><><><><><><><><><><><><><><>

## 2022-10-23 ENCOUNTER — Ambulatory Visit: Payer: Medicare Other | Admitting: Internal Medicine

## 2022-10-31 DIAGNOSIS — S61211A Laceration without foreign body of left index finger without damage to nail, initial encounter: Secondary | ICD-10-CM | POA: Diagnosis not present

## 2022-10-31 DIAGNOSIS — Z23 Encounter for immunization: Secondary | ICD-10-CM | POA: Diagnosis not present

## 2022-11-03 ENCOUNTER — Other Ambulatory Visit (HOSPITAL_COMMUNITY): Payer: Self-pay

## 2022-11-21 ENCOUNTER — Other Ambulatory Visit (HOSPITAL_COMMUNITY): Payer: Self-pay

## 2022-11-21 ENCOUNTER — Other Ambulatory Visit: Payer: Self-pay | Admitting: Internal Medicine

## 2022-11-21 DIAGNOSIS — J84112 Idiopathic pulmonary fibrosis: Secondary | ICD-10-CM

## 2022-11-21 MED ORDER — PIRFENIDONE 267 MG PO TABS
ORAL_TABLET | ORAL | 0 refills | Status: DC
  Filled 2022-11-21 – 2022-12-17 (×3): qty 159, fill #0
  Filled 2023-01-21: qty 159, 30d supply, fill #0

## 2022-11-24 ENCOUNTER — Other Ambulatory Visit (HOSPITAL_COMMUNITY): Payer: Self-pay

## 2022-12-09 ENCOUNTER — Other Ambulatory Visit (HOSPITAL_COMMUNITY): Payer: Self-pay

## 2022-12-12 ENCOUNTER — Other Ambulatory Visit: Payer: Self-pay

## 2022-12-12 DIAGNOSIS — Z1211 Encounter for screening for malignant neoplasm of colon: Secondary | ICD-10-CM

## 2022-12-12 LAB — POC HEMOCCULT BLD/STL (HOME/3-CARD/SCREEN)
Card #2 Fecal Occult Blod, POC: NEGATIVE
Card #3 Fecal Occult Blood, POC: NEGATIVE
Fecal Occult Blood, POC: NEGATIVE

## 2022-12-15 DIAGNOSIS — Z1211 Encounter for screening for malignant neoplasm of colon: Secondary | ICD-10-CM | POA: Diagnosis not present

## 2022-12-15 DIAGNOSIS — Z1212 Encounter for screening for malignant neoplasm of rectum: Secondary | ICD-10-CM | POA: Diagnosis not present

## 2022-12-16 ENCOUNTER — Other Ambulatory Visit: Payer: Self-pay

## 2022-12-17 ENCOUNTER — Other Ambulatory Visit: Payer: Self-pay

## 2022-12-18 ENCOUNTER — Other Ambulatory Visit: Payer: Self-pay

## 2022-12-22 ENCOUNTER — Other Ambulatory Visit: Payer: Self-pay | Admitting: Internal Medicine

## 2022-12-22 DIAGNOSIS — F5101 Primary insomnia: Secondary | ICD-10-CM

## 2022-12-31 ENCOUNTER — Other Ambulatory Visit: Payer: Self-pay

## 2023-01-01 ENCOUNTER — Other Ambulatory Visit (HOSPITAL_COMMUNITY): Payer: Self-pay

## 2023-01-06 ENCOUNTER — Ambulatory Visit: Payer: Medicare Other | Admitting: Thoracic Surgery (Cardiothoracic Vascular Surgery)

## 2023-01-15 ENCOUNTER — Ambulatory Visit: Payer: Medicare Other | Admitting: Nurse Practitioner

## 2023-01-19 ENCOUNTER — Ambulatory Visit (INDEPENDENT_AMBULATORY_CARE_PROVIDER_SITE_OTHER): Payer: Medicare Other | Admitting: Internal Medicine

## 2023-01-19 ENCOUNTER — Encounter: Payer: Self-pay | Admitting: Internal Medicine

## 2023-01-19 VITALS — BP 122/66 | HR 78 | Temp 98.8°F | Ht 63.0 in | Wt 142.4 lb

## 2023-01-19 DIAGNOSIS — J84112 Idiopathic pulmonary fibrosis: Secondary | ICD-10-CM

## 2023-01-19 DIAGNOSIS — F17200 Nicotine dependence, unspecified, uncomplicated: Secondary | ICD-10-CM

## 2023-01-19 DIAGNOSIS — Z5181 Encounter for therapeutic drug level monitoring: Secondary | ICD-10-CM

## 2023-01-19 LAB — CBC WITH DIFFERENTIAL/PLATELET
Basophils Absolute: 0.1 10*3/uL (ref 0.0–0.1)
Basophils Relative: 0.6 % (ref 0.0–3.0)
Eosinophils Absolute: 0.2 10*3/uL (ref 0.0–0.7)
Eosinophils Relative: 2.3 % (ref 0.0–5.0)
HCT: 39.4 % (ref 36.0–46.0)
Hemoglobin: 13.2 g/dL (ref 12.0–15.0)
Lymphocytes Relative: 26.7 % (ref 12.0–46.0)
Lymphs Abs: 2.6 10*3/uL (ref 0.7–4.0)
MCHC: 33.4 g/dL (ref 30.0–36.0)
MCV: 92.6 fl (ref 78.0–100.0)
Monocytes Absolute: 0.5 10*3/uL (ref 0.1–1.0)
Monocytes Relative: 4.8 % (ref 3.0–12.0)
Neutro Abs: 6.3 10*3/uL (ref 1.4–7.7)
Neutrophils Relative %: 65.6 % (ref 43.0–77.0)
Platelets: 271 10*3/uL (ref 150.0–400.0)
RBC: 4.26 Mil/uL (ref 3.87–5.11)
RDW: 13.8 % (ref 11.5–15.5)
WBC: 9.6 10*3/uL (ref 4.0–10.5)

## 2023-01-19 LAB — HEPATIC FUNCTION PANEL
ALT: 14 U/L (ref 0–35)
AST: 17 U/L (ref 0–37)
Albumin: 4.2 g/dL (ref 3.5–5.2)
Alkaline Phosphatase: 50 U/L (ref 39–117)
Bilirubin, Direct: 0.1 mg/dL (ref 0.0–0.3)
Total Bilirubin: 0.3 mg/dL (ref 0.2–1.2)
Total Protein: 7.1 g/dL (ref 6.0–8.3)

## 2023-01-19 LAB — BASIC METABOLIC PANEL
BUN: 20 mg/dL (ref 6–23)
CO2: 26 mEq/L (ref 19–32)
Calcium: 9.3 mg/dL (ref 8.4–10.5)
Chloride: 107 mEq/L (ref 96–112)
Creatinine, Ser: 0.9 mg/dL (ref 0.40–1.20)
GFR: 61.19 mL/min (ref >60.00)
Glucose, Bld: 90 mg/dL (ref 70–99)
Potassium: 3.8 mEq/L (ref 3.5–5.1)
Sodium: 141 mEq/L (ref 135–145)

## 2023-01-19 NOTE — Patient Instructions (Addendum)
ICD-10-CM   1. IPF (idiopathic pulmonary fibrosis) ([place])  J84.112     2. Encounter for therapeutic drug monitoring  Z51.81     3. Smoker  F17.200       Pulmonary fibrosis appears stable Tolerating pirfenidone well without any side effects Last CT scan of the chest and pulmonary function test was in May 2023 Glad you have cut down on smoking but there is some relapse -noticed that you are going to try to quit again  Plan -Check CBC, chemistry, liver function test 01/19/2023  - Continue pirfenidone as before  -Check with the pharmacy benefits manager if they can just send you the medication every month automatically w- without you having to call them -Do high-resolution CT chest without contrast in 4 months [last 28 Apr 2022 and from a smoking perspective you need 1 anyways] -Do spirometry and DLCO in 4 months -Take consent form for the bone study [I am also emailing it to [facility]  - team member from [facility] will contact you  Follow-up - 30-minute visit with Dr. Chase Caller in 4 months but after pulmonary function test and CT scan  -Symptoms: Walking desaturation test at follow-up.

## 2023-01-19 NOTE — Progress Notes (Signed)
Synopsis: Referred in November 2022 for lung nodule by [name], MD  Subjective:   PATIENT ID: Alison Campbell GENDER: female DOB: 23-May-1946, MRN: [id number]  Chief Complaint  Patient presents with   Follow-up    Follow up. Patient says she's been coughing up a lot of phlegm.     This is a 77 year old female, past medical history of gastroesophageal reflux, CKD, hyperlipidemia.Patient had a lung cancer screening CT on 11/02/2021.  Lung cancer screening CT revealed multiple small pulmonary nodules.  There was a new pleural-based nodule in the right upper lobe.  With a mean dry volume of 5.3 mm in diameter.  No other significant suspicious lesions.  Also has associated centrilobular and paraseptal emphysema.  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  Patient was referred to pulmonary for evaluation.  OV 11/13/2021: Here today to discuss abnormal CT imaging.  We also had a long discussion regarding smoking cessation.  Please see separate documentation regarding smoking cessation.  Patient has smoked since age 18.  She quit for 9 years however has had approximately 40+ pack years smoking at around half to 1 full pack per day.  OV 05/29/2022: Here today for follow-up recent CT scan.CT scan of the chest was completed on 05/09/2022 this was in follow-up of a right upper lobe pulmonary nodule.  Findings felt to be consistent with infection or inflammatory lesion on CT.  There is unchanged mild pulmonary fibrosis with apical to basal gradient irregular peripheral interstitial opacities groundglass and varicoid bronchiectasis without any evidence of honeycombing.  Findings on the CT were suggestive of an alternate diagnosis besides UIP.  From a respiratory standpoint she is doing okay.  She is not very active so she is not seeing much difference in her shortness of breath related to exertion.   OV 07/03/2022  Subjective:  Patient  ID: Alison Campbell, female , DOB: Aug 28, 1946 , age 77 y.o. , MRN: [id number] , ADDRESS: [place] [address] PCP [name], MD Patient Care Team: [name], MD as PCP - General (Internal Medicine) Milus Banister, MD as Attending Physician (Gastroenterology) Sable Feil, MD as Consulting Physician (Gastroenterology) Rozetta Nunnery, MD (Inactive) as Consulting Physician (Otolaryngology) Druscilla Brownie, MD as Consulting Physician (Dermatology) Garner Nash, DO as Consulting Physician (Pulmonary Disease)  This Provider for this visit: Treatment Team:  Attending Provider: Brand Males, MD    07/03/2022 -transfer of care from Dr. Leory Plowman Icard to the pulm fibrosis center Dr. Chase Caller Chief Complaint  Patient presents with   Follow-up    Pt states she has been doing okay since last visit and denies any complaints.     HPI Alison Campbell 77 y.o. -pleasant female has been getting annual low-dose CT scans since 2018 annually.  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  Therefore she has been referred here.  According to the patient she is quite asymptomatic.  She barely has any symptoms and she feels fine.  She did have some cough in November 2022 and this is actually cleared up.  Current symptom severity is listed below   [facility] Comprehensive ILD Questionnaire  Symptoms:  Very mild    Past Medical History :  -She believes she might have some COPD.  Denies any asthma connective tissue disease.  Denies  any stroke seizures hepatitis pulmonary hypertension.  Denies kidney disease.  Denies heart disease denies pleurisy. -She had COVID-vaccine but not the COVID   ROS:  8 pound intentional weight loss but otherwise negative  FAMILY HISTORY of LUNG DISEASE:  -She has an interesting family history.  Her  maternal grandmother was the cousin of any Ermalinda Barrios  the [employer] ([email or web address]).  Her father was himself born in [birth year].  She was born in [birth year].  There is no family history of any lung disease.  PERSONAL EXPOSURE HISTORY:  -She started smoking in 1966.  She still smokes cigarettes.  In between she quit for 9 years.  No cigar smoking.  No marijuana smoking no cocaine no intravenous drug use  HOME  EXPOSURE and HOBBY DETAILS :  Single-family home for the last 29 years.  Age of the home is 47 years.  She does some gardening but otherwise detailed organic antigen exposure history in the house is negative  OCCUPATIONAL HISTORY (122 questions) : She is done some foot care work but otherwise detailed organic and inorganic antigen exposure history is negative  PULMONARY TOXICITY HISTORY (27 items):  She has been on prednisone in the past  INVESTIGATIONS:  Personally visualized all the CT scans and showed it to her" agree with current thinking of alternative to UIP diagnosis.  annual LDCT 2018, 2019, 2020 and 2021 July and 2022 November . ILD first repored in July 2021   and this the LDCT reported ILD as "progressive" though prior scan no mention of ILD. In my professional opinion: some ILD was there in 2019:. HRCT then done Nov 2022 - reported as prob UI and the May 2023 HRCT reported as altermnative dx. AGree with alternative diagnsos    CT Chest data - HRCT May 2023  Narrative & Impression  CLINICAL DATA:  Follow-up right upper lobe pulmonary nodule   EXAM: CT CHEST WITHOUT CONTRAST   TECHNIQUE: Multidetector CT imaging of the chest was performed following the standard protocol without intravenous contrast. High resolution imaging of the lungs, as well as inspiratory and expiratory imaging, was performed.   RADIATION DOSE REDUCTION: This exam was performed according to the departmental dose-optimization program which includes automated exposure control,  adjustment of the mA and/or kV according to patient size and/or use of iterative reconstruction technique.   COMPARISON:  11/02/2021   FINDINGS: Cardiovascular: Aortic atherosclerosis. Normal heart size. Left and right coronary artery calcifications. No pericardial effusion.   Mediastinum/Nodes: No enlarged mediastinal, hilar, or axillary lymph nodes. Thyroid gland, trachea, and esophagus demonstrate no significant findings.   Lungs/Pleura: Minimal paraseptal emphysema. Subpleural nodule of the anterior right upper lobe new on prior examination is diminished in fullness and conspicuity, with an irregular remnant in this vicinity (series 13, image 109). Additional small pulmonary nodules are unchanged. Unchanged mild pulmonary fibrosis in a pattern with apical to basal gradient featuring irregular peripheral interstitial opacity, ground-glass, and varicoid bronchiectasis without evidence of subpleural bronchiolectasis or honeycombing. No significant air trapping on expiratory phase imaging. No pleural effusion or pneumothorax.   Upper Abdomen: No acute abnormality.   Musculoskeletal: No chest wall abnormality. No suspicious osseous lesions identified.   IMPRESSION: 1. Subpleural nodule of the anterior right upper lobe new on prior examination is diminished in fullness and conspicuity, with an irregular remnant in this vicinity. Findings are consistent nonspecific sequelae of infection or inflammation, for which no further specific follow-up is required. Additional small pulmonary nodules are unchanged. Lung RADS category 2, benign appearance or  behavior. Consider ongoing low-dose CT lung cancer screening if indicated by patient age, smoking history, and/or other risk factors for lung cancer. 2. Unchanged mild pulmonary fibrosis in a pattern with apical to basal gradient featuring irregular peripheral interstitial opacity, ground-glass, and varicoid bronchiectasis without  evidence of subpleural bronchiolectasis or honeycombing. Findings are suggestive of an alternative diagnosis (not UIP) per consensus guidelines: Diagnosis of Idiopathic Pulmonary Fibrosis: An Official ATS/ERS/JRS/ALAT Clinical Practice Guideline. [place], Iss 5, [phone], Aug 29 2017. 3. Minimal emphysema. 4. Coronary artery disease.   Aortic Atherosclerosis (ICD10-I70.0) and Emphysema (ICD10-J43.9).     Electronically Signed   By: Delanna Ahmadi M.D.   On: 05/12/2022 15:42        PFT  OV 10/01/2022  Subjective:  Patient ID: Alison Campbell, female , DOB: 16-Nov-1946 , age 39 y.o. , MRN: [id number] , ADDRESS: [place] [address] PCP [name], MD Patient Care Team: [name], MD as PCP - General (Internal Medicine) Milus Banister, MD as Attending Physician (Gastroenterology) Sable Feil, MD as Consulting Physician (Gastroenterology) Rozetta Nunnery, MD (Inactive) as Consulting Physician (Otolaryngology) Druscilla Brownie, MD as Consulting Physician (Dermatology) Garner Nash, DO as Consulting Physician (Pulmonary Disease)  This Provider for this visit: Treatment Team:  Attending Provider: Brand Males, MD    10/01/2022 -   Chief Complaint  Patient presents with   Follow-up    Pt recently had a procedure performed by Dr. Roxan Hockey.  Pt states she has been doing okay since last visit. States has some soreness from the procedure that was done.     HPI Alison Campbell 77 y.o. -presents with her daughter Jeralene Peters who is a Psychologist, clinical.  Vaughan Basta used to work for [employer] and also [employer].  Patient CT scan had alternative diagnosis to UIP.  Therefore she underwent surgical lung biopsy on 08/15/2022.  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  We discussed several aspects of IPF mainly that is progressive disease with significant variability.   Overall she is stable.  Other than the soreness from the surgery she is fine.  We discussed 3 options of nintedanib versus pirfenidone versus participating in clinical trial with a modified form of pirfenidone.  The summary of this is detailed below.  She absolutely does not want area side effect.  Therefore she not interested in nintedanib.  She is known to have diverticulosis and is concerned this might provoke diverticulitis therefore even more caution against nintedanib.  We discussed pirfenidone with key elements below.  We discussed the [facility] trial along with the concept of clinical trials as a care option.  She is taken a copy of the consent to reflect on what would be the appropriate course for her which would either be take standard of care pirfenidone or participate in the clinical trial.   She quit smoking 09/15/22         PFT  SLB [id number]  SURGICAL PATHOLOGY  CASE: [id number]  PATIENT: [place]  Surgical Pathology Report      Clinical History: Interstitial lung disease (nt)      FINAL MICROSCOPIC DIAGNOSIS:   A. 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 LUNG, RIGHT UPPER LOBE, RESECTION:  - Minimal to mild interstitial fibrosis with elastosis (see comment)  C. 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 There is evidence of architectural distortion with  peribronchiolar and smooth muscle metaplasia. Multiple fibroblastic foci  are seen along with focal microscopic honeycombing. Granulomas or  increased  eosinophils are not seen. Overall, the distribution and nature  of the interstitial fibrosis in this case are most suggestive of usual  interstitial pneumonia (UIP) pattern. UIP pattern may be seen with  collagen vascular diseases, drug reactions, or as an idiopathic disease  (idiopathic pulmonary fibrosis). Scattered lymphoid aggregates within  the affected areas may favor a collagen vascular disease. Clinical and  radiographic correlation is necessary.    OV 01/19/2023  Subjective:  Patient ID: Alison Campbell, female , DOB: 1946/02/28 , age 82 y.o. 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  01/19/2023 -   Chief Complaint  Patient presents with   Follow-up    Follow up for IPF. Pt is still continuing the pirfenidone three times a day with no issues noted at this time.      HPI Alison Campbell 77 y.o. -returns for follow-up.  Last seen in the fall 2023.  She continues to do well.  She says symptoms are stable.  Not much shortness of breath.  She is not on oxygen.  She is tolerating pirfenidone really well except for slight diminished appetite but no weight loss.  No nausea no vomiting no diarrhea no GI upset no fatigue no  skin rash.  She is relapsed with her smoking.  She is smoking only few cigarettes though.  She says she is trying to cut down.  Last pulmonary function test and high-resolution CT chest was in May 2023.  Her last set of labs was in October 2023.  I reviewed this.  She needs another set of labs.  Of note she still has some incisional pain from lung biopsy.  It is very mild and tolerable she does not want medicines.  She has upcoming appointment Dr. Roxan Hockey.  We discussed participation in DEXA bone scan research protocol.  She is interested in this.  Pulmonary fibrosis patients might be at high risk for osteoporosis.  She has not had a bone scan in some years.  Given a copy of the consent form.  I also emailed the daughter the consent form.  The daughter is not here with her today.  Her daughter Vaughan Basta is now working at [employer] long as a Marine scientist.  She is pretty excited about that.    SYMPTOM SCALE - ILD 07/03/2022 10/01/2022  01/19/2023   Current weight  O2 use ra ra   Shortness of Breath 0 -> 5 scale with 5 being worst (score 6 If unable to do)    At rest 0 0   Simple tasks - showers, clothes change, eating, shaving 1 1   Household (dishes, doing bed, laundry) 0 2   Shopping 0 2   Walking level at own pace 0 3   Walking up Stairs 0 1   Total (30-36) Dyspnea Score 1 9       Non-dyspnea symptoms (0-> 5 scale) 07/03/2022 10/01/2022  01/19/2023   How bad is your cough? 1 0   How bad is your fatigue 2 2   How bad is nausea 2 0   How bad is vomiting?  0 0   How bad is diarrhea? 0 00   How bad is anxiety? 0 0   How bad is depression 0 0   Any chronic pain - if so where and how bad 0 0    Simple office walk 185 feet x  3 laps goal with forehead probe 07/03/2022    O2 used ra   Number laps completed 3   Comments about pace avg   Resting Pulse Ox/HR 98% and 69/min   Final Pulse Ox/HR 96% and 96/min   Desaturated </= 88% no   Desaturated <= 3% points no   Got Tachycardic >/= 90/min  yes   Symptoms at end of test No complaints   Miscellaneous comments x      Modified Six Minute Walk - 01/19/23 1000     Type of O2 used  Room Air    Number of laps completed  3    Lap Pace Moderate    Resting Heartrate 80 bpm    Final Heartrate 92 bpm    Resting Pulse Ox 100 %    Desaturated to <= 3 points No    Desaturated to < 88% No    Became tachycardic No    Symptoms  no symptoms noted while walking 3 laps    Was the O2 correction test done? No    comments lab1: 98%/92, lap2:100%/94, lap3:100%/95 and after: 100%/92              PFT     Latest Ref Rng & Units 05/07/2022    1:36 PM  PFT Results  FVC-Pre L 1.97   FVC-Predicted Pre % 75   FVC-Post L 2.02   FVC-Predicted Post % 77   Pre FEV1/FVC % % 82   Post FEV1/FCV % % 77   FEV1-Pre L 1.62   FEV1-Predicted Pre % 83   FEV1-Post L 1.55   DLCO uncorrected ml/min/mmHg 12.18   DLCO UNC% % 66   DLCO corrected ml/min/mmHg 11.74   DLCO COR %Predicted % 63   DLVA Predicted % 87   TLC L 4.74   TLC % Predicted % 96   RV % Predicted % 118        has a past medical history of Anxiety, CKD (chronic kidney disease) stage 3, GFR 30-59 ml/min (HCC) (12/24/2020), Colon polyps, Costochondritis, GERD (gastroesophageal reflux disease), Hyperlipidemia, Insomnia, Osteoporosis, and Pre-diabetes.   reports that she quit smoking about 5 months ago. Her smoking use included cigarettes. She started smoking about 58 years ago. She has a 57.00 pack-year smoking history. She has never used smokeless tobacco.  Past Surgical History:  Procedure Laterality Date   ABDOMINAL HYSTERECTOMY     COLONOSCOPY  09/10/2011   hysterectomy  INTERCOSTAL NERVE BLOCK Right 08/15/2022   Procedure: INTERCOSTAL NERVE BLOCK;  Surgeon: [name], MD;  Location: [facility];  Service: Thoracic;  Laterality: Right;   LUNG BIOPSY Right 08/15/2022   Procedure: LUNG BIOPSY;  Surgeon: [name], MD;  Location: [facility];  Service: Thoracic;   Laterality: Right;   POLYPECTOMY     UVULECTOMY N/A 09/09/2016   Procedure: PARTIAL UVULECTOMY, direct laryngoscopy;  Surgeon: Rozetta Nunnery, MD;  Location: [facility];  Service: ENT;  Laterality: N/A;  PARTIAL UVULECTOMY, direct laryngoscopy    Allergies  Allergen Reactions   Fish Oil Diarrhea   Spiriva Respimat [Tiotropium Bromide Monohydrate] Cough    Gaging and excessive phlegm    Immunization History  Administered Date(s) Administered   Fluad Quad(high Dose 65+) 09/25/2022   Influenza, High Dose Seasonal PF 08/29/2013, 10/25/2015, 11/24/2017, 11/29/2018, 10/15/2021   Influenza,inj,Quad PF,6+ Mos 10/31/2022   PFIZER(Purple Top)SARS-COV-2 Vaccination 02/02/2020, 02/28/2020   Pneumococcal Conjugate-13 02/02/2015   Pneumococcal Polysaccharide-23 03/29/2010   Tdap 02/26/2009, 10/31/2022   Zoster, Live 12/29/2008    Family History  Problem Relation Age of Onset   Heart disease Mother    Heart attack Mother    Heart disease Father    Diabetes Father    Heart attack Father    Cancer Brother        Lung   Bipolar disorder Daughter    Colon cancer Neg Hx    Esophageal cancer Neg Hx    Stomach cancer Neg Hx    Rectal cancer Neg Hx      Current Outpatient Medications:    Ascorbic Acid (VITAMIN C) 1000 MG tablet, Take 1,000 mg by mouth daily., Disp: , Rfl:    Calcium Carb-Cholecalciferol (CALCIUM 600 + D PO), Take 2 tablets by mouth daily., Disp: , Rfl:    cholecalciferol (VITAMIN D3) 25 MCG (1000 UNIT) tablet, Take 5,000 Units by mouth daily., Disp: , Rfl:    ezetimibe (ZETIA) 10 MG tablet, TAKE 1 TABLET BY MOUTH DAILY FOR CHOLESTEROL, Disp: 90 tablet, Rfl: 3   Multiple Vitamin (MULTIVITAMIN PO), Take 1 tablet by mouth daily., Disp: , Rfl:    Pirfenidone (ESBRIET) 267 MG TABS, Month 1: take 1 tablet three times daily for 1 week then increase to 2 tablets three times daily thereafter. **NOTE LOW DOSE**, Disp: 159 tablet, Rfl: 0   Pirfenidone 267 MG TABS,  Take 2 tablets (534 mg total) by mouth with breakfast, with lunch, and with evening meal., Disp: 180 tablet, Rfl: 3   Psyllium (METAMUCIL FIBER PO), Take 3 tablets by mouth daily., Disp: , Rfl:    rosuvastatin (CRESTOR) 20 MG tablet, TAKE 1 TABLET(20 MG) BY MOUTH DAILY, Disp: 90 tablet, Rfl: 0   traZODone (DESYREL) 150 MG tablet, Take 1/2 to 1 tablet 1 hour before Bedtime if needed for Sleep                                  /                                 TAKE  BY                            MOUTH, Disp: 90 tablet, Rfl: 3   acetaminophen-codeine (TYLENOL #3) 300-30 MG tablet, Take  1 tablet  every 3 to 4 hours  to Prevent cough, Disp: 30 tablet, Rfl: 0      Objective:   Vitals:   01/19/23 1007  BP: 122/66  Pulse: 78  Temp: 98.8 F (37.1 C)  TempSrc: Oral  SpO2: 98%  Weight: 142 lb 6.4 oz (64.6 kg)  Height: '5\' 3"'$  (1.6 m)    Estimated body mass index is 25.23 kg/m as calculated from the following:   Height as of this encounter: '5\' 3"'$  (1.6 m).   Weight as of this encounter: 142 lb 6.4 oz (64.6 kg).  '@WEIGHTCHANGE'$ @  [employer]   01/19/23 1007  Weight: 142 lb 6.4 oz (64.6 kg)     Physical Exam    General: No distress. Looks well Neuro: Alert and Oriented x 3. GCS 15. Speech normal Psych: Pleasant Resp:  Barrel Chest - no.  Wheeze - no, Crackles - yes base, No overt respiratory distress CVS: Normal heart sounds. Murmurs - no Ext: Stigmata of Connective Tissue Disease - no HEENT: Normal upper airway. PEERL +. No post nasal drip        Assessment:       ICD-10-CM   1. IPF (idiopathic pulmonary fibrosis) (HCC)  J84.112 CT Chest High Resolution    Pulmonary function test    2. Encounter for therapeutic drug monitoring  Z51.81 CBC w/Diff    Hepatic function panel    Basic Metabolic Panel (BMET)    3. Smoker  F17.200          Plan:     Patient Instructions     ICD-10-CM   1. IPF (idiopathic pulmonary fibrosis)  ([place])  J84.112     2. Encounter for therapeutic drug monitoring  Z51.81     3. Smoker  F17.200       Pulmonary fibrosis appears stable Tolerating pirfenidone well without any side effects Last CT scan of the chest and pulmonary function test was in May 2023 Glad you have cut down on smoking but there is some relapse -noticed that you are going to try to quit again  Plan -Check CBC, chemistry, liver function test 01/19/2023  - Continue pirfenidone as before  -Check with the pharmacy benefits manager if they can just send you the medication every month automatically w- without you having to call them -Do high-resolution CT chest without contrast in 4 months [last 28 Apr 2022 and from a smoking perspective you need 1 anyways] -Do spirometry and DLCO in 4 months -Take consent form for the bone study [I am also emailing it to [place]  - team member from [facility] will contact you  Follow-up - 30-minute visit with Dr. Chase Caller in 4 months but after pulmonary function test and CT scan  -Symptoms: Walking desaturation test at follow-up.  Moderate Complexity MDM NEW OFFICE  The table below is from the 2021 E/M guidelines, first released in 2021, with minor revisions added in 2023. Must meet the requirements for 2 out of 3 dimensions to qualify.    Number and complexity of problems addressed Amount and/or complexity of data reviewed Risk of complications and/or morbidity  One or more chronic illness with mild exacerbation, progression, or side effects of treatment  Two or more stable  chronic illnesses  One undiagnosed new problem with uncertain prognosis  One acute illness with systemic symptoms   Acute complicated injury Must meet the requirements for 1 of 3 of the categories)  Category 1: Tests and documents, historian  Any combination of 3 of the following:  Assessment requiring an independent historian  Review of prior external records  Review of results of each unique  test  Ordering of each unique test    Category 2: Interpretation of tests  Independent interpretation of a test perfromed by another physician/NPP  Category 3: Discuss management/tests  Discussion of magagement or tests with an external physician/NPP Prescription drug management  Decision regarding minor surgery with identfied patient or procedure risk factors  Decision regarding elective major surgery without identified patient or procedure risk factors  Diagnosis or treatment significantly limited by social determinants of health             SIGNATURE    Dr. Brand Males, M.D., F.C.C.P,  Pulmonary and Critical Care Medicine Staff Physician, [place] Director - Interstitial Lung Disease  Program  Pulmonary [place] [facility] [place], [place], [address]  Pager: [phone], If no answer or between  15:00h - 7:00h: call [phone] Telephone: [phone]  10:58 AM 01/19/2023

## 2023-01-21 ENCOUNTER — Other Ambulatory Visit (HOSPITAL_COMMUNITY): Payer: Self-pay

## 2023-01-27 ENCOUNTER — Other Ambulatory Visit (HOSPITAL_COMMUNITY): Payer: Self-pay

## 2023-01-28 ENCOUNTER — Other Ambulatory Visit: Payer: Self-pay

## 2023-02-02 ENCOUNTER — Ambulatory Visit: Payer: Medicare Other | Admitting: Nurse Practitioner

## 2023-02-06 ENCOUNTER — Encounter: Payer: Self-pay | Admitting: Nurse Practitioner

## 2023-02-06 ENCOUNTER — Ambulatory Visit (INDEPENDENT_AMBULATORY_CARE_PROVIDER_SITE_OTHER): Payer: Medicare Other | Admitting: Nurse Practitioner

## 2023-02-06 VITALS — BP 140/80 | HR 66 | Temp 97.7°F | Ht 63.0 in | Wt 140.6 lb

## 2023-02-06 DIAGNOSIS — R7309 Other abnormal glucose: Secondary | ICD-10-CM

## 2023-02-06 DIAGNOSIS — N1831 Chronic kidney disease, stage 3a: Secondary | ICD-10-CM | POA: Diagnosis not present

## 2023-02-06 DIAGNOSIS — I2510 Atherosclerotic heart disease of native coronary artery without angina pectoris: Secondary | ICD-10-CM | POA: Diagnosis not present

## 2023-02-06 DIAGNOSIS — Z86010 Personal history of colonic polyps: Secondary | ICD-10-CM | POA: Diagnosis not present

## 2023-02-06 DIAGNOSIS — J84112 Idiopathic pulmonary fibrosis: Secondary | ICD-10-CM

## 2023-02-06 DIAGNOSIS — Z0001 Encounter for general adult medical examination with abnormal findings: Secondary | ICD-10-CM

## 2023-02-06 DIAGNOSIS — R6889 Other general symptoms and signs: Secondary | ICD-10-CM

## 2023-02-06 DIAGNOSIS — I700 Atherosclerosis of aorta: Secondary | ICD-10-CM

## 2023-02-06 DIAGNOSIS — Z79899 Other long term (current) drug therapy: Secondary | ICD-10-CM | POA: Diagnosis not present

## 2023-02-06 DIAGNOSIS — Z0000 Encounter for general adult medical examination without abnormal findings: Secondary | ICD-10-CM

## 2023-02-06 DIAGNOSIS — M810 Age-related osteoporosis without current pathological fracture: Secondary | ICD-10-CM

## 2023-02-06 DIAGNOSIS — K5730 Diverticulosis of large intestine without perforation or abscess without bleeding: Secondary | ICD-10-CM | POA: Diagnosis not present

## 2023-02-06 DIAGNOSIS — E782 Mixed hyperlipidemia: Secondary | ICD-10-CM | POA: Diagnosis not present

## 2023-02-06 DIAGNOSIS — E559 Vitamin D deficiency, unspecified: Secondary | ICD-10-CM | POA: Diagnosis not present

## 2023-02-06 DIAGNOSIS — J42 Unspecified chronic bronchitis: Secondary | ICD-10-CM | POA: Diagnosis not present

## 2023-02-06 DIAGNOSIS — R0989 Other specified symptoms and signs involving the circulatory and respiratory systems: Secondary | ICD-10-CM

## 2023-02-06 DIAGNOSIS — F17200 Nicotine dependence, unspecified, uncomplicated: Secondary | ICD-10-CM

## 2023-02-06 NOTE — Progress Notes (Signed)
Medicare Wellness and 3 Month Follow Up  Assessment:   Annual Wellness Due annually  Health Maintenance reviewed Healthy lifestyle reviewed and goals set  Essential hypertension Controlled with lifestyle Discussed DASH (Dietary Approaches to Stop Hypertension) DASH diet is lower in sodium than a typical American diet. Cut back on foods that are high in saturated fat, cholesterol, and trans fats. Eat more whole-grain foods, fish, poultry, and nuts Remain active and exercise as tolerated daily.  Monitor BP at home-Call if greater than 130/80.  Check CMP/CBC   Hyperlipidemia/Aortic atherosclerosis ([name]) - CT 06/2020 Continue Rosuvastatin Discussed lifestyle modifications. Recommended diet heavy in fruits and veggies, omega 3's. Decrease consumption of animal meats, cheeses, and dairy products. Remain active and exercise as tolerated. Continue to monitor. 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 Smoking cessation instruction/counseling given:  counseled patient on the dangers of tobacco use, advised patient to stop smoking, and reviewed strategies to maximize success  CT low dose annually- now follows with pulm, pending PFTs Currently stable without respiratory medications  Abnormal glucose Education: Reviewed 'ABCs' of diabetes management  Discussed goals to be met and/or maintained include A1C (<7) Blood pressure (<130/80) Cholesterol (LDL <70) Continue Eye Exam yearly  Continue Dental Exam Q6 mo Discussed dietary recommendations Discussed Physical Activity recommendations Foot exam UTD Check A1C   Medication management All medications discussed and reviewed in full. All questions and concerns regarding medications addressed.    Tobacco use disorder Smoking cessation-   instruction/counseling given, counseled patient on the dangers of tobacco use, advised patient to stop smoking, and reviewed strategies to maximize success, patient not ready to quit at this time.  Patient now follows with pulm for lung cancer screening   Vitamin D deficiency Continue supplement for goal of 60-100 Monitor levels  CKD 3 ([place]) Discussed how what you eat and drink can aide in kidney protection. Stay well hydrated. Avoid high salt foods. Avoid NSAIDS. Keep BP and BG well controlled.   Take medications as prescribed. Remain active and exercise as tolerated daily. Maintain weight.  Continue to monitor. Check CMP/GFR/Microablumin   Diverticulosis of large intestine without hemorrhage/hx of colon polyp Monitor - no recent flare Increase fiber, push fluids Colonoscopy UTD  Osteoporosis, unspecified osteoporosis type, unspecified pathological fracture presence Has been on fosamax x 5 years Advised to stop fosamax for at least 1 year Pursue a combination of weight-bearing exercises and strength training. Advised on fall prevention measures including proper lighting in all rooms, removal of area rugs and floor clutter, use of walking devices as deemed appropriate, avoidance of uneven walking surfaces. Smoking cessation and moderate alcohol consumption if applicable Consume [id number] to 1000 IU of vitamin D daily with a goal vitamin D serum value of 30 ng/mL or higher. Aim for 1000 to 1200 mg of elemental calcium daily through supplements and/or dietary sources.   Orders Placed This Encounter  Procedures   Lipid panel   Hemoglobin A1c   VITAMIN D 25 Hydroxy (Vit-D Deficiency, Fractures)    Notify office for further evaluation and treatment, questions or concerns if any reported s/s fail to improve.   The patient was advised to call back or seek an in-person evaluation if any symptoms worsen or if the condition fails to improve as anticipated.   Further disposition pending  results of labs. Discussed med's effects and  SE's.    I discussed the assessment and treatment plan with the patient. The patient was provided an opportunity to ask questions and all were answered. The patient agreed with the plan and demonstrated an understanding of the instructions.  Discussed med's effects and SE's. Screening labs and tests as requested with regular follow-up as recommended.  I provided 35 minutes of face-to-face time during this encounter including counseling, chart review, and critical decision making was preformed.  Future Appointments  Date Time Provider [place]  02/24/2023 10:45 AM [name], MD [facility]  05/11/2023 11:30 AM [name], MD [facility] None  10/21/2023  2:00 PM [name], MD [facility] None  02/09/2024 10:00 AM Darrol Jump, NP [facility] None      Plan:   During the course of the visit the patient was educated and counseled about appropriate screening and preventive services including:   Pneumococcal vaccine  Prevnar 13 Influenza vaccine Td vaccine Screening electrocardiogram Bone densitometry screening Colorectal cancer screening Diabetes screening Glaucoma screening Nutrition counseling  Advanced directives: requested   Subjective:   Alison Campbell is a 77 y.o. female who presents for Medicare Annual Wellness Visit and 3 month follow up. She has Diverticulosis of large intestine; History of colon polyps; Essential hypertension; Hyperlipidemia, mixed; Abnormal glucose; Chronic bronchitis ([place]); Vitamin D deficiency; Tobacco use disorder; Osteoporosis; Aortic atherosclerosis ([place]) by CT Lung Scan in July 2021; Insomnia; Dyshidrotic foot dermatitis; BMI 25.0-25.9,adult; CKD (chronic kidney disease) stage 3, GFR 30-59 ml/min (HCC); 3-vessel CAD; and ILD (interstitial lung disease) ([place]) on their problem list.  Overall she reports feeling well.  She has no new or additional concerns to report.     She has hx of osteoporosis, last DEXA was 12/17/2016 Osteoporosis- - 2.7, was on fosamax for several years, no long taking.  Does not wish to proceed with further scans.  She does continue to take a Calcium Carb-Cholecalciferol supplement and a daily Vitamin D3, 5,000 IU.    She is a smoker since age 36, 40+ pack year hx, has been undergoing annual low dose lung CT for screening. 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 June Leap, planning PFTs and follow up CT.  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    she currently continues to smoke 0.5 pack a day; discussed risks associated with smoking, patient is not ready to quit.   She has insomnia, currently prescribed trazodone 150 mg and reports effectiveness.   She has persistent left sole dyshidrotic dermatitis, didn't resolve with steroid injections by derm, declined further interventions. No longer wishes to f/u with dermatology.  Denies any new or recent skin changes.    BMI is Body mass index is 24.91 kg/m., she has been working on diet. Wt Readings from Last 3 Encounters:  02/06/23 140 lb 9.6 oz (63.8 kg)  01/19/23 142 lb 6.4 oz (64.6 kg)  10/15/22 140 lb 6.4 oz (63.7 kg)   She had normal stress myoview 02/2018. CT 11/02/2021 showed 3 vessel CAD and known aortic atherosclerosis per CT 06/2020.  She is on statin and bASA.   Her blood pressure has been controlled at home, today their  BP is BP: (!) 140/80 She does not workout. She denies chest pain, shortness of breath, dizziness.   She is on cholesterol medication,  rosuvastatin 20 mg daily and zetia 10 mg daily and denies myalgias. Her cholesterol is at goal. The cholesterol last visit was:   Lab Results  Component Value Date   CHOL 149 10/15/2022   HDL 48 (L) 10/15/2022   LDLCALC 74 10/15/2022   TRIG 167 (H) 10/15/2022   CHOLHDL 3.1 10/15/2022   She has been working on diet and exercise for glucose management, and denies foot ulcerations, paresthesia of the feet and polydipsia. Last A1C in the office was:  Lab Results  Component Value Date   HGBA1C 5.8 (H) 10/15/2022   She has CKD IIIa since 2016, monitored at this office, last GFR:  Lab Results  Component Value Date   EGFR 58 (L) 10/15/2022   EGFR 53 (L) 04/18/2022   EGFR 57 (L) 01/15/2022   Patient is on Vitamin D supplement. Lab Results  Component Value Date   VD25OH 67 10/15/2022   She is taking a b12 supplement, declines check today but agreeable at CPE.  Lab Results  Component Value Date   VITAMINB12 386 04/18/2022   Medication Review Current Outpatient Medications on File Prior to Visit  Medication Sig Dispense Refill   Ascorbic Acid (VITAMIN C) 1000 MG tablet Take 1,000 mg by mouth daily.     Calcium Carb-Cholecalciferol (CALCIUM 600 + D PO) Take 2 tablets by mouth daily.     cholecalciferol (VITAMIN D3) 25 MCG (1000 UNIT) tablet Take 5,000 Units by mouth daily.     ezetimibe (ZETIA) 10 MG tablet TAKE 1 TABLET BY MOUTH DAILY FOR CHOLESTEROL 90 tablet 3   Multiple Vitamin (MULTIVITAMIN PO) Take 1 tablet by mouth daily.     Pirfenidone (ESBRIET) 267 MG TABS Month 1: take 1 tablet three times daily for 1 week then increase to 2 tablets three times daily thereafter. **NOTE LOW DOSE** 159 tablet 0   Pirfenidone 267 MG TABS Take 2 tablets (534 mg total) by mouth with breakfast, with lunch, and with evening meal. 180 tablet 3   Psyllium (METAMUCIL FIBER PO) Take 3 tablets by mouth daily.     rosuvastatin (CRESTOR) 20 MG tablet TAKE 1 TABLET(20 MG) BY MOUTH DAILY 90 tablet 0    traZODone (DESYREL) 150 MG tablet Take 1/2 to 1 tablet 1 hour before Bedtime if needed for Sleep                                  /                                 TAKE                                               BY                            MOUTH 90 tablet 3   No current facility-administered medications on file prior to visit.   Allergies  Allergen Reactions   Fish Oil Diarrhea   Spiriva Respimat [Tiotropium Bromide Monohydrate] Cough    Gaging and  excessive phlegm     Current Problems (verified) Patient Active Problem List   Diagnosis Date Noted   ILD (interstitial lung disease) ([place]) 08/15/2022   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/14/2022   BMI 25.0-25.9,adult 12/24/2020   CKD (chronic kidney disease) stage 3, GFR 30-59 ml/min ([place]) 12/24/2020   Insomnia    Dyshidrotic foot dermatitis    Aortic atherosclerosis ([place]) by CT Lung Scan in July 2021 03/11/2018   Tobacco use disorder 06/21/2015   Osteoporosis 06/21/2015   Essential hypertension 11/07/2013   Hyperlipidemia, mixed 11/07/2013   Abnormal glucose 11/07/2013   Chronic bronchitis ([place]) 11/07/2013   Vitamin D deficiency 11/07/2013   Diverticulosis of large intestine 09/10/2011   History of colon polyps 09/10/2011    Screening Tests Immunization History  Administered Date(s) Administered   Fluad Quad(high Dose 65+) 09/25/2022   Influenza, High Dose Seasonal PF 08/29/2013, 10/25/2015, 11/24/2017, 11/29/2018, 10/15/2021   Influenza,inj,Quad PF,6+ Mos 10/31/2022   PFIZER(Purple Top)SARS-COV-2 Vaccination 02/02/2020, 02/28/2020   Pneumococcal Conjugate-13 02/02/2015   Pneumococcal Polysaccharide-23 03/29/2010   Tdap 02/26/2009, 10/31/2022   Zoster, Live 12/29/2008   Health Maintenance  Topic Date Due   Zoster Vaccines- Shingrix (1 of 2) Never done   COVID-19 Vaccine (3 - Pfizer risk series) 03/27/2020   COLONOSCOPY (Pts 45-67yr Insurance coverage will need to be confirmed)  01/08/2023   Lung Cancer Screening  05/10/2023    Medicare Annual Wellness (AWV)  02/07/2024   DTaP/Tdap/Td (3 - Td or Tdap) 10/31/2032   Pneumonia Vaccine 77 Years old  Completed   INFLUENZA VACCINE  Completed   DEXA SCAN  Completed   Hepatitis C Screening  Completed   HPV VACCINES  Aged Out   Colonoscopy:  12/2019 3 year recall - declines further Mammogram:  06/2020 - declines further  Dexa:  2017 - declines further  Pap:  Completed  Last eye: 20232 - due will schedule  Last dental: 2023 see periodontist - has upcoming apt  Last Derm:  Does not follow   Patient Care Team: MUnk Pinto MD as PCP - General (Internal Medicine) JMilus Banister MD as Attending Physician (Gastroenterology) PSable Feil MD as Consulting Physician (Gastroenterology) NRozetta Nunnery MD (Inactive) as Consulting Physician (Otolaryngology) LDruscilla Brownie MD as Consulting Physician (Dermatology) IGarner Nash DO as Consulting Physician (Pulmonary Disease)   Allergies Allergies  Allergen Reactions   Fish Oil Diarrhea   Spiriva Respimat [Tiotropium Bromide Monohydrate] Cough    Gaging and excessive phlegm    SURGICAL HISTORY She  has a past surgical history that includes hysterectomy; Polypectomy; Abdominal hysterectomy; Uvulectomy (N/A, 09/09/2016); Colonoscopy (09/10/2011); Lung biopsy (Right, 08/15/2022); and Intercostal nerve block (Right, 08/15/2022). FAMILY HISTORY Her family history includes Bipolar disorder in her daughter; Cancer in her brother; Diabetes in her father; Heart attack in her father and mother; Heart disease in her father and mother. SOCIAL HISTORY She  reports that she quit smoking about 5 months ago. Her smoking use included cigarettes. She started smoking about 58 years ago. She has a 57.00 pack-year smoking history. She has never used smokeless tobacco. She reports that she does not drink alcohol and does not use drugs.  MEDICARE WELLNESS OBJECTIVES: Physical activity:   Cardiac risk factors:    Depression/mood screen:      10/15/2022    9:49 PM  Depression screen PHQ 2/9  Decreased Interest 0  Down, Depressed, Hopeless 0  PHQ - 2 Score 0    ADLs:     02/06/2023   11:32 AM  10/15/2022    9:50 PM  In your present state of health, do you have any difficulty performing the following activities:  Hearing? 1 0  Comment Reports mild hearing loss - she is not wanting hearing aides at this time   Vision? 0 0  Comment Wears glasses   Difficulty concentrating or making decisions? 0 0  Walking or climbing stairs? 0 0  Dressing or bathing? 0 0  Doing errands, shopping? 0 0  Preparing Food and eating ? N   Using the Toilet? N   In the past six months, have you accidently leaked urine? N   Do you have problems with loss of bowel control? N   Managing your Medications? N   Managing your Finances? N   Housekeeping or managing your Housekeeping? N      Cognitive Testing  Alert? Yes  Normal Appearance?Yes  Oriented to person? Yes  Place? Yes   Time? Yes  Recall of three objects?  Yes  Can perform simple calculations? Yes  Displays appropriate judgment?Yes  Can read the correct time from a watch face?Yes  EOL planning:       Objective:   Blood pressure (!) 140/80, pulse 66, temperature 97.7 F (36.5 C), height 5' 3"$  (1.6 m), weight 140 lb 9.6 oz (63.8 kg), SpO2 98 %. Body mass index is 24.91 kg/m.  General appearance: alert, no distress, WD/WN,  female HEENT: normocephalic, sclerae anicteric, TMs pearly, nares patent, no discharge or erythema, pharynx normal, crowded mouth.  Oral cavity: MMM, no lesions Neck: supple, no lymphadenopathy, no thyromegaly, no masses Heart: RRR, normal S1, S2, no murmurs Lungs: CTA bilaterally, no wheezes, rhonchi, or rales Abdomen: +bs, soft, non tender, non distended, no masses, no hepatomegaly, no splenomegaly Musculoskeletal: nontender, no swelling, no obvious deformity Extremities: no edema, no cyanosis, no clubbing Pulses: 2+  symmetric, upper and lower extremities, normal cap refill Skin: Palms of hands and soles of feet with mild erythema/scaling. R sole with scaling and pitting.  Neurological: alert, oriented x 3, CN2-12 intact, strength normal upper extremities and lower extremities, sensation normal throughout, DTRs 2+ throughout, no cerebellar signs, gait normal Psychiatric: normal affect, behavior normal, pleasant   Medicare Attestation I have personally reviewed: The patient's medical and social history Their use of alcohol, tobacco or illicit drugs Their current medications and supplements The patient's functional ability including ADLs,fall risks, home safety risks, cognitive, and hearing and visual impairment Diet and physical activities Evidence for depression or mood disorders  The patient's weight, height, BMI, and visual acuity have been recorded in the chart.  I have made referrals, counseling, and provided education to the patient based on review of the above and I have provided the patient with a written personalized care plan for preventive services.     Darrol Jump, NP   02/06/2023

## 2023-02-06 NOTE — Patient Instructions (Signed)

## 2023-02-07 LAB — LIPID PANEL
Cholesterol: 166 mg/dL (ref <200)
HDL: 44 mg/dL — ABNORMAL LOW (ref >=50)
LDL Cholesterol (Calc): 89 mg/dL (calc)
Non-HDL Cholesterol (Calc): 122 mg/dL (calc) (ref <130)
Total CHOL/HDL Ratio: 3.8 (calc) (ref <5.0)
Triglycerides: 254 mg/dL — ABNORMAL HIGH (ref <150)

## 2023-02-07 LAB — HEMOGLOBIN A1C
Hgb A1c MFr Bld: 5.7 % of total Hgb — ABNORMAL HIGH (ref <5.7)
Mean Plasma Glucose: 117 mg/dL
eAG (mmol/L): 6.5 mmol/L

## 2023-02-07 LAB — VITAMIN D 25 HYDROXY (VIT D DEFICIENCY, FRACTURES): Vit D, 25-Hydroxy: 80 ng/mL (ref 30–100)

## 2023-02-18 ENCOUNTER — Encounter: Payer: Self-pay | Admitting: Gastroenterology

## 2023-02-24 ENCOUNTER — Encounter: Payer: Self-pay | Admitting: Thoracic Surgery (Cardiothoracic Vascular Surgery)

## 2023-02-24 ENCOUNTER — Ambulatory Visit (INDEPENDENT_AMBULATORY_CARE_PROVIDER_SITE_OTHER): Payer: Medicare Other | Admitting: Thoracic Surgery (Cardiothoracic Vascular Surgery)

## 2023-02-24 ENCOUNTER — Other Ambulatory Visit (HOSPITAL_COMMUNITY): Payer: Self-pay

## 2023-02-24 VITALS — BP 163/81 | HR 79 | Resp 20 | Ht 63.0 in | Wt 139.0 lb

## 2023-02-24 DIAGNOSIS — Z9889 Other specified postprocedural states: Secondary | ICD-10-CM

## 2023-02-24 DIAGNOSIS — I2510 Atherosclerotic heart disease of native coronary artery without angina pectoris: Secondary | ICD-10-CM

## 2023-02-24 DIAGNOSIS — J849 Interstitial pulmonary disease, unspecified: Secondary | ICD-10-CM

## 2023-02-24 NOTE — Progress Notes (Signed)
[address]       [place] [address]             [phone]     HPI: Alison Campbell returns for a scheduled follow-up visit after lung biopsy.  Alison Campbell is a 77 year old woman with known ILD.  I did a lung biopsy on 08/15/2022.  Pathology showed UIP.  She has some intercostal neuralgia postoperatively and was on gabapentin 300 mg twice daily.  I last saw her in September she was still having some pain.  In the interim since her last visit her pain has improved dramatically.  The only time she hurts now is when she sits in 1 position too long.  Respiratory status has been stable on pirfenidone.  Past Medical History:  Diagnosis Date   Anxiety    CKD (chronic kidney disease) stage 3, GFR 30-59 ml/min (HCC) 12/24/2020   Colon polyps    Costochondritis    GERD (gastroesophageal reflux disease)    coffee related   Hyperlipidemia    Insomnia    Osteoporosis    Pre-diabetes     Current Outpatient Medications  Medication Sig Dispense Refill   Ascorbic Acid (VITAMIN C) 1000 MG tablet Take 1,000 mg by mouth daily.     Calcium Carb-Cholecalciferol (CALCIUM 600 + D PO) Take 2 tablets by mouth daily.     cholecalciferol (VITAMIN D3) 25 MCG (1000 UNIT) tablet Take 5,000 Units by mouth daily.     ezetimibe (ZETIA) 10 MG tablet TAKE 1 TABLET BY MOUTH DAILY FOR CHOLESTEROL 90 tablet 3   Multiple Vitamin (MULTIVITAMIN PO) Take 1 tablet by mouth daily.     Pirfenidone 267 MG TABS Take 2 tablets (534 mg total) by mouth with breakfast, with lunch, and with evening meal. 180 tablet 3   Psyllium (METAMUCIL FIBER PO) Take 3 tablets by mouth daily.     rosuvastatin (CRESTOR) 20 MG tablet TAKE 1 TABLET(20 MG) BY MOUTH DAILY 90 tablet 0   traZODone (DESYREL) 150 MG tablet Take 1/2 to 1 tablet 1 hour before Bedtime if needed for Sleep                                  /                                 TAKE                                               BY                             MOUTH 90 tablet 3   No current facility-administered medications for this visit.    Physical Exam BP (!) 163/81 (BP Location: Right Arm, Patient Position: Sitting)   Pulse 79   Resp 20   Ht '5\' 3"'$  (1.6 m)   Wt 139 lb (63 kg)   SpO2 95% Comment: Ra  BMI 24.70 kg/m  77 year old woman in no acute distress Alert and oriented x 3 Lungs with faint crackles at bases otherwise clear Incisions well-healed  Diagnostic Tests: None  Impression: Alison Campbell is a 77 year old woman  with UIP who had a lung biopsy back in August.  She is now about 6 months out from surgery and doing well.  ILD-being managed by Dr. Chase Caller  Plan: I will be happy to see her back at any time in the future if I can be of any further assistance with her care  [name], MD [facility] [phone]

## 2023-03-03 ENCOUNTER — Other Ambulatory Visit (HOSPITAL_COMMUNITY): Payer: Self-pay

## 2023-03-26 ENCOUNTER — Other Ambulatory Visit: Payer: Self-pay

## 2023-03-26 ENCOUNTER — Other Ambulatory Visit (HOSPITAL_COMMUNITY): Payer: Self-pay

## 2023-03-26 ENCOUNTER — Other Ambulatory Visit: Payer: Self-pay | Admitting: Internal Medicine

## 2023-03-26 DIAGNOSIS — J84112 Idiopathic pulmonary fibrosis: Secondary | ICD-10-CM

## 2023-03-26 MED ORDER — PIRFENIDONE 267 MG PO TABS
534.0000 mg | ORAL_TABLET | Freq: Three times a day (TID) | ORAL | 3 refills | Status: DC
  Filled 2023-03-26: qty 180, 30d supply, fill #0
  Filled 2023-04-29: qty 180, 30d supply, fill #1
  Filled 2023-06-01 – 2023-06-22 (×2): qty 180, 30d supply, fill #2
  Filled 2023-07-14: qty 180, 30d supply, fill #3

## 2023-04-03 ENCOUNTER — Telehealth: Payer: Self-pay | Admitting: Internal Medicine

## 2023-04-03 ENCOUNTER — Other Ambulatory Visit (HOSPITAL_COMMUNITY): Payer: Self-pay

## 2023-04-03 NOTE — Telephone Encounter (Signed)
Alison Campbell has the ct's to scheduled

## 2023-04-03 NOTE — Telephone Encounter (Signed)
PT needs to be sched for a ct before sched appts for  pft and ov. Ty.

## 2023-04-07 NOTE — Telephone Encounter (Signed)
.  h 

## 2023-04-21 ENCOUNTER — Ambulatory Visit: Payer: Medicare Other | Admitting: Internal Medicine

## 2023-04-29 ENCOUNTER — Other Ambulatory Visit (HOSPITAL_COMMUNITY): Payer: Self-pay

## 2023-05-06 ENCOUNTER — Other Ambulatory Visit (HOSPITAL_COMMUNITY): Payer: Self-pay

## 2023-05-06 ENCOUNTER — Other Ambulatory Visit: Payer: Self-pay

## 2023-05-07 ENCOUNTER — Other Ambulatory Visit: Payer: Self-pay

## 2023-05-07 ENCOUNTER — Other Ambulatory Visit (HOSPITAL_COMMUNITY): Payer: Self-pay

## 2023-05-11 ENCOUNTER — Ambulatory Visit (INDEPENDENT_AMBULATORY_CARE_PROVIDER_SITE_OTHER): Payer: Medicare Other | Admitting: Internal Medicine

## 2023-05-11 ENCOUNTER — Encounter: Payer: Self-pay | Admitting: Internal Medicine

## 2023-05-11 VITALS — BP 138/78 | HR 81 | Temp 97.9°F | Resp 16 | Ht 63.0 in | Wt 139.6 lb

## 2023-05-11 DIAGNOSIS — N1831 Chronic kidney disease, stage 3a: Secondary | ICD-10-CM

## 2023-05-11 DIAGNOSIS — R0989 Other specified symptoms and signs involving the circulatory and respiratory systems: Secondary | ICD-10-CM

## 2023-05-11 DIAGNOSIS — E782 Mixed hyperlipidemia: Secondary | ICD-10-CM

## 2023-05-11 DIAGNOSIS — I700 Atherosclerosis of aorta: Secondary | ICD-10-CM | POA: Diagnosis not present

## 2023-05-11 DIAGNOSIS — Z79899 Other long term (current) drug therapy: Secondary | ICD-10-CM

## 2023-05-11 DIAGNOSIS — E559 Vitamin D deficiency, unspecified: Secondary | ICD-10-CM

## 2023-05-11 DIAGNOSIS — R7309 Other abnormal glucose: Secondary | ICD-10-CM | POA: Diagnosis not present

## 2023-05-11 DIAGNOSIS — J84112 Idiopathic pulmonary fibrosis: Secondary | ICD-10-CM | POA: Diagnosis not present

## 2023-05-11 MED ORDER — ROSUVASTATIN CALCIUM 20 MG PO TABS: ORAL_TABLET | ORAL | 3 refills | Status: DC

## 2023-05-11 NOTE — Patient Instructions (Signed)

## 2023-05-11 NOTE — Progress Notes (Signed)
Future Appointments  Date Time Provider Department  05/11/2023                   6 mo ov 11:30 AM Lucky Cowboy, MD [facility]  05/13/2023 11:00 AM [facility] 2 [facility]  05/19/2023  4:00 PM [facility]  05/21/2023 10:30 AM Kalman Shan, MD [facility]  10/21/2023                cpe  2:00 PM Lucky Cowboy, MD [facility]  02/09/2024                  wellness 10:00 AM Adela Glimpse, NP [facility]    History of Present Illness:       This very nice 77 y.o. WWF presents for 6 month follow up with HTN, HLD, Pre-Diabetes and Vitamin D Deficiency. Chest CT scan in July 2021  showed Aortic Atherosclerosis & Coronary Aa calcifications       In Aug 2023 , patient had Rt thoracoscopy  for Lung bx  confirming Interstitial Lung disease (UIP)  By Dr Dorris Fetch. Currently, she is on pirfenidone managed  by Dr Marchelle Gearing . She denies any c/o of cough  or DOE .       Patient is treated for HTN  since  & BP has been controlled at home. Today's BP is at goal - 138/78 . Chest CT scan in July 2021  showed Aortic Atherosclerosis & Coronary Aa calcifications.  Patient has had no complaints of any cardiac type chest pain, palpitations, dyspnea / orthopnea / PND, dizziness, claudication, or dependent edema.        Hyperlipidemia is controlled with diet & meds. Patient denies myalgias or other med SE's. Last Lipids were  Lab Results  Component Value Date   CHOL 166 02/06/2023   HDL 44 (L) 02/06/2023   LDLCALC 89 02/06/2023   TRIG 254 (H) 02/06/2023   CHOLHDL 3.8 02/06/2023      Also, the patient has history of PreDiabetes and has had no symptoms of reactive hypoglycemia, diabetic polys, paresthesias or visual blurring.  Last A1c was   Lab Results  Component Value Date   HGBA1C 5.7 (H) 02/06/2023         Further, the patient also has history of Vitamin D Deficiency and supplements vitamin D . Last vitamin D was  Lab Results  Component Value Date   VD25OH 85 02/06/2023      Current  Outpatient Medications on File Prior to Visit  Medication Sig   VITAMIN C 1000 MG tablet Take 1,000 mg bdaily.   CALCIUM 600 + D  Take 2 tablets daily.   VITAMIN D 5000 u Take daily.   ezetimibe (ZETIA) 10 MG tablet TAKE 1 TABLET  DAILY    Multiple Vitamin  Take 1 tablet  daily.   Pirfenidone 267 MG TABS Take 2 tablets (534 mg total) 3 x /day with breakfast, with lunch & supper   Psyllium (METAMUCIL FIBER PO) Take 3 tablets  daily.   rosuvastatin (CRESTOR) 20 MG tablet TAKE 1 TABLET  DAILY   traZODone (DESYREL) 150 MG tablet Take 1/2 to 1 tablet 1 hour before Bedtime if needed     Allergies  Allergen Reactions   Fish Oil Diarrhea   Spiriva Respimat [Tiotropium Bromide Monohydrate] Cough    Gaging and excessive phlegm      PMHx:   Past Medical History:  Diagnosis Date   Anxiety    CKD (chronic kidney disease) stage 3,  GFR 30-59 ml/min (HCC) 12/24/2020   Colon polyps    Costochondritis    GERD (gastroesophageal reflux disease)    coffee related   Hyperlipidemia    Insomnia    Osteoporosis    Pre-diabetes       Immunization History  Administered Date(s) Administered   Fluad Quad(high Dose 65+) 09/25/2022   Influenza, High Dose Seasonal PF 08/29/2013, 10/25/2015, 11/24/2017, 11/29/2018, 10/15/2021   Influenza,inj,Quad PF,6+ Mos 10/31/2022   PFIZER(Purple Top)SARS-COV-2 Vaccination 02/02/2020, 02/28/2020   Pneumococcal Conjugate-13 02/02/2015   Pneumococcal Polysaccharide-23 03/29/2010   Tdap 02/26/2009, 10/31/2022   Zoster, Live 12/29/2008      Past Surgical History:  Procedure Laterality Date   ABDOMINAL HYSTERECTOMY     COLONOSCOPY  09/10/2011   hysterectomy     INTERCOSTAL NERVE BLOCK Right 08/15/2022   Procedure: INTERCOSTAL NERVE BLOCK;  Surgeon: Loreli Slot, MD;  Location: [facility] OR;  Service: Thoracic;  Laterality: Right;   LUNG BIOPSY Right 08/15/2022   Procedure: LUNG BIOPSY;  Surgeon: Loreli Slot, MD;  Location: [facility] OR;  Service:  Thoracic;  Laterality: Right;   POLYPECTOMY     UVULECTOMY N/A 09/09/2016   Procedure: PARTIAL UVULECTOMY, direct laryngoscopy;  Surgeon: [name], MD;  Location: [facility];  Service: ENT;  Laterality: N/A;  PARTIAL UVULECTOMY, direct laryngoscopy     FHx:    Reviewed / unchanged   SHx:    Reviewed / unchanged    Systems Review:  Constitutional: Denies fever, chills, wt changes, headaches, insomnia, fatigue, night sweats, change in appetite. Eyes: Denies redness, blurred vision, diplopia, discharge, itchy, watery eyes.  ENT: Denies discharge, congestion, post nasal drip, epistaxis, sore throat, earache, hearing loss, dental pain, tinnitus, vertigo, sinus pain, snoring.  CV: Denies chest pain, palpitations, irregular heartbeat, syncope, dyspnea, diaphoresis, orthopnea, PND, claudication or edema. Respiratory: denies cough, dyspnea, DOE, pleurisy, hoarseness, laryngitis, wheezing.  Gastrointestinal: Denies dysphagia, odynophagia, heartburn, reflux, water brash, abdominal pain or cramps, nausea, vomiting, bloating, diarrhea, constipation, hematemesis, melena, hematochezia  or hemorrhoids. Genitourinary: Denies dysuria, frequency, urgency, nocturia, hesitancy, discharge, hematuria or flank pain. Musculoskeletal: Denies arthralgias, myalgias, stiffness, jt. swelling, pain, limping or strain/sprain.  Skin: Denies pruritus, rash, hives, warts, acne, eczema or change in skin lesion(s). Neuro: No weakness, tremor, incoordination, spasms, paresthesia or pain. Psychiatric: Denies confusion, memory loss or sensory loss. Endo: Denies change in weight, skin or hair change.  Heme/Lymph: No excessive bleeding, bruising or enlarged lymph nodes.   Physical Exam  BP 138/78   Pulse 81   Temp 97.9 F (36.6 C)   Resp 16   Ht 5\' 3"  (1.6 m)   Wt 139 lb 9.6 oz (63.3 kg)   SpO2 97%   BMI 24.73 kg/m   Appears  well nourished, well groomed  and in no distress.  Eyes:  PERRLA, EOMs, conjunctiva no swelling or erythema. Sinuses: No frontal/maxillary tenderness ENT/Mouth: EAC's clear, TM's nl w/o erythema, bulging. Nares clear w/o erythema, swelling, exudates. Oropharynx clear without erythema or exudates. Oral hygiene is good. Tongue normal, non obstructing. Hearing intact.  Neck: Supple. Thyroid not palpable. Car 2+/2+ without bruits, nodes or JVD. Chest: Respirations nl with BS clear & equal w/o rales, rhonchi, wheezing or stridor.  Cor: Heart sounds normal w/ regular rate and rhythm without sig. murmurs, gallops, clicks or rubs. Peripheral pulses normal and equal  without edema.  Abdomen: Soft & bowel sounds normal. Non-tender w/o guarding, rebound, hernias, masses or organomegaly.  Lymphatics: Unremarkable.  Musculoskeletal: Full ROM  all peripheral extremities, joint stability, 5/5 strength and normal gait.  Skin: Warm, dry without exposed rashes, lesions or ecchymosis apparent.  Neuro: Cranial nerves intact, reflexes equal bilaterally. Sensory-motor testing grossly intact. Tendon reflexes grossly intact.  Pysch: Alert & oriented x 3.  Insight and judgement nl & appropriate. No ideations.   Assessment and Plan:  1. Labile hypertension  - Continue medication, monitor blood pressure at home.  - Continue DASH diet.  Reminder to go to the ER if any CP,  SOB, nausea, dizziness, severe HA, changes vision/speech.   - CBC with Differential/Platelet - COMPLETE METABOLIC PANEL WITH GFR - Magnesium - TSH  2. IPF (idiopathic pulmonary fibrosis) (HCC)   3. Hyperlipidemia, mixed  - Continue diet/meds, exercise,& lifestyle modifications.  - Continue monitor periodic cholesterol/liver & renal functions    - Lipid panel - TSH  4. Abnormal glucose  - Continue diet, exercise  - Lifestyle modifications.  - Monitor appropriate labs    - Hemoglobin A1c - Insulin, random  5. Vitamin D deficiency  - Continue supplementation   - VITAMIN D 25  Hydroxy  6. Aortic atherosclerosis (HCC) by CT Lung Scan in July 2021  - Lipid panel  7. Stage 3a chronic kidney disease (HCC)  - COMPLETE METABOLIC PANEL WITH GFR  8. Medication management  - CBC with Differential/Platelet - COMPLETE METABOLIC PANEL WITH GFR - Magnesium - Lipid panel - TSH - Hemoglobin A1c - Insulin, random - VITAMIN D 25 Hydroxy             Discussed  regular exercise, BP monitoring, weight control to achieve/maintain BMI less than 25 and discussed med and SE's. Recommended labs to assess /monitor clinical status .  I discussed the assessment and treatment plan with the patient. The patient was provided an opportunity to ask questions and all were answered. The patient agreed with the plan and demonstrated an understanding of the instructions.  I provided over 30 minutes of exam, counseling, chart review and  complex critical decision making.        The patient was advised to call back or seek an in-person evaluation if the symptoms worsen or if the condition fails to improve as anticipated.   Marinus Maw, MD

## 2023-05-12 LAB — CBC WITH DIFFERENTIAL/PLATELET
Absolute Monocytes: 360 cells/uL (ref 200–950)
Basophils Absolute: 41 cells/uL (ref 0–200)
Basophils Relative: 0.6 %
Eosinophils Absolute: 122 cells/uL (ref 15–500)
Eosinophils Relative: 1.8 %
HCT: 40.1 % (ref 35.0–45.0)
Hemoglobin: 13.3 g/dL (ref 11.7–15.5)
Lymphs Abs: 1652 cells/uL (ref 850–3900)
MCH: 30.6 pg (ref 27.0–33.0)
MCHC: 33.2 g/dL (ref 32.0–36.0)
MCV: 92.4 fL (ref 80.0–100.0)
MPV: 11.5 fL (ref 7.5–12.5)
Monocytes Relative: 5.3 %
Neutro Abs: 4624 cells/uL (ref 1500–7800)
Neutrophils Relative %: 68 %
Platelets: 259 10*3/uL (ref 140–400)
RBC: 4.34 10*6/uL (ref 3.80–5.10)
RDW: 12.4 % (ref 11.0–15.0)
Total Lymphocyte: 24.3 %
WBC: 6.8 10*3/uL (ref 3.8–10.8)

## 2023-05-12 LAB — COMPLETE METABOLIC PANEL WITH GFR
AG Ratio: 1.7 (calc) (ref 1.0–2.5)
ALT: 14 U/L (ref 6–29)
AST: 15 U/L (ref 10–35)
Albumin: 4.2 g/dL (ref 3.6–5.1)
Alkaline phosphatase (APISO): 62 U/L (ref 37–153)
BUN/Creatinine Ratio: 19 (calc) (ref 6–22)
BUN: 20 mg/dL (ref 7–25)
CO2: 31 mmol/L (ref 20–32)
Calcium: 10.5 mg/dL — ABNORMAL HIGH (ref 8.6–10.4)
Chloride: 106 mmol/L (ref 98–110)
Creat: 1.07 mg/dL — ABNORMAL HIGH (ref 0.60–1.00)
Globulin: 2.5 g/dL (calc) (ref 1.9–3.7)
Glucose, Bld: 126 mg/dL — ABNORMAL HIGH (ref 65–99)
Potassium: 4.2 mmol/L (ref 3.5–5.3)
Sodium: 143 mmol/L (ref 135–146)
Total Bilirubin: 0.2 mg/dL (ref 0.2–1.2)
Total Protein: 6.7 g/dL (ref 6.1–8.1)
eGFR: 53 mL/min/{1.73_m2} — ABNORMAL LOW (ref >=60)

## 2023-05-12 LAB — MAGNESIUM: Magnesium: 2.1 mg/dL (ref 1.5–2.5)

## 2023-05-12 LAB — LIPID PANEL
Cholesterol: 183 mg/dL (ref <200)
HDL: 44 mg/dL — ABNORMAL LOW (ref >=50)
LDL Cholesterol (Calc): 92 mg/dL (calc)
Non-HDL Cholesterol (Calc): 139 mg/dL (calc) — ABNORMAL HIGH (ref <130)
Total CHOL/HDL Ratio: 4.2 (calc) (ref <5.0)
Triglycerides: 352 mg/dL — ABNORMAL HIGH (ref <150)

## 2023-05-12 LAB — HEMOGLOBIN A1C
Hgb A1c MFr Bld: 5.5 % of total Hgb (ref <5.7)
Mean Plasma Glucose: 111 mg/dL
eAG (mmol/L): 6.2 mmol/L

## 2023-05-12 LAB — INSULIN, RANDOM: Insulin: 37.2 u[IU]/mL — ABNORMAL HIGH

## 2023-05-12 LAB — TSH: TSH: 0.74 mIU/L (ref 0.40–4.50)

## 2023-05-12 LAB — VITAMIN D 25 HYDROXY (VIT D DEFICIENCY, FRACTURES): Vit D, 25-Hydroxy: 108 ng/mL — ABNORMAL HIGH (ref 30–100)

## 2023-05-12 NOTE — Progress Notes (Signed)
^<^<^<^<^<^<^<^<^<^<^<^<^<^<^<^<^<^<^<^<^<^<^<^<^<^<^<^<^<^<^<^<^<^<^<^<^ ^>^>^>^>^>^>^>^>^>^>^>>^>^>^>^>^>^>^>^>^>^>^>^>^>^>^>^>^>^>^>^>^>^>^>^>^>  -    Test results slightly outside the reference range are not unusual. If there is anything important, I will review this with you,  otherwise it is considered normal test values.  If you have further questions,  please do not hesitate to contact me at the office or via My Chart.   ^<^<^<^<^<^<^<^<^<^<^<^<^<^<^<^<^<^<^<^<^<^<^<^<^<^<^<^<^<^<^<^<^<^<^<^<^ ^>^>^>^>^>^>^>^>^>^>^>^>^>^>^>^>^>^>^>^>^>^>^>^>^>^>^>^>^>^>^>^>^>^>^>^>^  -  Chol = 183  & LDL = 92 - Both  are low   - But  Triglycerides ( = 352 ) or fats in blood are too high                 (   Ideal or  Goal is less than 150  !  )    - Recommend avoid fried & greasy foods,  sweets / candy,   - Avoid white rice  (brown or wild rice or Quinoa is OK),   - Avoid white potatoes  (sweet potatoes are OK)   - Avoid anything made from white flour  - bagels, doughnuts, rolls, buns, biscuits, white and   wheat breads, pizza crust and traditional  pasta made of white flour & egg white  - (vegetarian pasta or spinach or wheat pasta is OK).    - Multi-grain bread is OK - like multi-grain flat bread or  sandwich thins.   - Avoid alcohol in excess.   - Exercise is also important. ^>^>^>^>^>^>^>^>^>^>^>^>^>^>^>^>^>^>^>^>^>^>^>^>^>^>^>^>^>^>^>^>^>^>^>^>^  - Vitamin D = 108 is slightly elevated , Bu suggest keep dose same = 5,000 u /day ^>^>^>^>^>^>^>^>^>^>^>^>^>^>^>^>^>^>^>^>^>^>^>^>^>^>^>^>^>^>^>^>^>^>^>^>^ ^>^>^>^>^>^>^>^>^>^>^>^>^>^>^>^>^>^>^>^>^>^>^>^>^>^>^>^>^>^>^>^>^>^>^>^>^

## 2023-05-13 ENCOUNTER — Ambulatory Visit (HOSPITAL_COMMUNITY)
Admission: RE | Admit: 2023-05-13 | Discharge: 2023-05-13 | Disposition: A | Discharge status: Home / Self Care | Payer: Medicare Other | Source: Ambulatory Visit | Attending: Internal Medicine | Admitting: Internal Medicine

## 2023-05-13 DIAGNOSIS — J84112 Idiopathic pulmonary fibrosis: Secondary | ICD-10-CM | POA: Diagnosis not present

## 2023-05-13 DIAGNOSIS — J432 Centrilobular emphysema: Secondary | ICD-10-CM | POA: Diagnosis not present

## 2023-05-13 DIAGNOSIS — J479 Bronchiectasis, uncomplicated: Secondary | ICD-10-CM | POA: Diagnosis not present

## 2023-05-19 ENCOUNTER — Ambulatory Visit (INDEPENDENT_AMBULATORY_CARE_PROVIDER_SITE_OTHER): Payer: Medicare Other | Admitting: Internal Medicine

## 2023-05-19 DIAGNOSIS — J84112 Idiopathic pulmonary fibrosis: Secondary | ICD-10-CM | POA: Diagnosis not present

## 2023-05-19 LAB — PULMONARY FUNCTION TEST
DL/VA % pred: 88 %
DL/VA: 3.65 ml/min/mmHg/L
DLCO cor % pred: 66 %
DLCO cor: 12.1 ml/min/mmHg
DLCO unc % pred: 65 %
DLCO unc: 12.06 ml/min/mmHg
FEF 25-75 Pre: 1.2 L/sec
FEF2575-%Pred-Pre: 82 %
FEV1-%Pred-Pre: 85 %
FEV1-Pre: 1.63 L
FEV1FVC-%Pred-Pre: 97 %
FEV6-%Pred-Pre: 91 %
FEV6-Pre: 2.22 L
FEV6FVC-%Pred-Pre: 103 %
FVC-%Pred-Pre: 88 %
FVC-Pre: 2.26 L
Pre FEV1/FVC ratio: 72 %
Pre FEV6/FVC Ratio: 98 %

## 2023-05-19 NOTE — Patient Instructions (Signed)
Spiro/DLCO performed today.  

## 2023-05-19 NOTE — Progress Notes (Signed)
Spiro/DLCO performed today.  

## 2023-05-21 ENCOUNTER — Ambulatory Visit (INDEPENDENT_AMBULATORY_CARE_PROVIDER_SITE_OTHER): Payer: Medicare Other | Admitting: Internal Medicine

## 2023-05-21 ENCOUNTER — Encounter: Payer: Self-pay | Admitting: Internal Medicine

## 2023-05-21 VITALS — BP 138/70 | HR 73 | Ht 63.0 in | Wt 139.0 lb

## 2023-05-21 DIAGNOSIS — Z5181 Encounter for therapeutic drug level monitoring: Secondary | ICD-10-CM

## 2023-05-21 DIAGNOSIS — F17200 Nicotine dependence, unspecified, uncomplicated: Secondary | ICD-10-CM | POA: Diagnosis not present

## 2023-05-21 DIAGNOSIS — R21 Rash and other nonspecific skin eruption: Secondary | ICD-10-CM | POA: Diagnosis not present

## 2023-05-21 DIAGNOSIS — J84112 Idiopathic pulmonary fibrosis: Secondary | ICD-10-CM

## 2023-05-21 NOTE — Addendum Note (Signed)
Addended by: Hedda Slade on: 05/21/2023 11:36 AM   Modules accepted: Orders

## 2023-05-21 NOTE — Progress Notes (Signed)
Synopsis: Referred in November 2022 for lung nodule by Lucky Cowboy, MD  Subjective:   PATIENT ID: Alison Campbell GENDER: female DOB: July 04, 1946, MRN: [id number]  Chief Complaint  Patient presents with   Follow-up    Follow up. Patient says she's been coughing up a lot of phlegm.     This is a 77 year old female, past medical history of gastroesophageal reflux, CKD, hyperlipidemia.Patient had a lung cancer screening CT on 11/02/2021.  Lung cancer screening CT revealed multiple small pulmonary nodules.  There was a new pleural-based nodule in the right upper lobe.  With a mean dry volume of 5.3 mm in diameter.  No other significant suspicious lesions.  Also has associated centrilobular and paraseptal emphysema.  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  Patient was referred to pulmonary for evaluation.  OV 11/13/2021: Here today to discuss abnormal CT imaging.  We also had a long discussion regarding smoking cessation.  Please see separate documentation regarding smoking cessation.  Patient has smoked since age 54.  She quit for 9 years however has had approximately 40+ pack years smoking at around half to 1 full pack per day.  OV 05/29/2022: Here today for follow-up recent CT scan.CT scan of the chest was completed on 05/09/2022 this was in follow-up of a right upper lobe pulmonary nodule.  Findings felt to be consistent with infection or inflammatory lesion on CT.  There is unchanged mild pulmonary fibrosis with apical to basal gradient irregular peripheral interstitial opacities groundglass and varicoid bronchiectasis without any evidence of honeycombing.  Findings on the CT were suggestive of an alternate diagnosis besides UIP.  From a respiratory standpoint she is doing okay.  She is not very active so she is not seeing much difference in her shortness of breath related to exertion.   OV 07/03/2022  Subjective:  Patient  ID: Alison Campbell, female , DOB: 07-Apr-1946 , age 54 y.o. , MRN: [id number] , ADDRESS: [address] [place] [address] PCP Lucky Cowboy, MD Patient Care Team: Lucky Cowboy, MD as PCP - General (Internal Medicine) Rachael Fee, MD as Attending Physician (Gastroenterology) Mardella Layman, MD as Consulting Physician (Gastroenterology) [name], MD (Inactive) as Consulting Physician (Otolaryngology) Cherlyn Roberts, MD as Consulting Physician (Dermatology) Josephine Igo, DO as Consulting Physician (Pulmonary Disease)  This Provider for this visit: Treatment Team:  Attending Provider: Kalman Shan, MD    07/03/2022 -transfer of care from Dr. Elige Radon Icard to the pulm fibrosis center Dr. Marchelle Gearing Chief Complaint  Patient presents with   Follow-up    Pt states she has been doing okay since last visit and denies any complaints.     HPI Alison Campbell 77 y.o. -pleasant female has been getting annual low-dose CT scans since 2018 annually.  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  Therefore she has been referred here.  According to the patient she is quite asymptomatic.  She barely has any symptoms and she feels fine.  She did have some cough in November 2022 and this is actually cleared up.  Current symptom severity is listed below   [facility] Comprehensive ILD Questionnaire  Symptoms:  Very mild    Past Medical History :  -She believes she might have some COPD.  Denies any asthma connective tissue disease.  Denies  any stroke seizures hepatitis pulmonary hypertension.  Denies kidney disease.  Denies heart disease denies pleurisy. -She had COVID-vaccine but not the COVID   ROS:  8 pound intentional weight loss but otherwise negative  FAMILY HISTORY of LUNG DISEASE:  -She has an interesting family history.  Her  maternal grandmother was the cousin of any Marga Hoots  the [employer] ([email or web address]).  Her father was himself born in 62.  She was born in [birth year].  There is no family history of any lung disease.  PERSONAL EXPOSURE HISTORY:  -She started smoking in 1966.  She still smokes cigarettes.  In between she quit for 9 years.  No cigar smoking.  No marijuana smoking no cocaine no intravenous drug use  HOME  EXPOSURE and HOBBY DETAILS :  Single-family home for the last 29 years.  Age of the home is 50 years.  She does some gardening but otherwise detailed organic antigen exposure history in the house is negative  OCCUPATIONAL HISTORY (122 questions) : She is done some foot care work but otherwise detailed organic and inorganic antigen exposure history is negative  PULMONARY TOXICITY HISTORY (27 items):  She has been on prednisone in the past  INVESTIGATIONS:  Personally visualized all the CT scans and showed it to her" agree with current thinking of alternative to UIP diagnosis.  annual LDCT 2018, 2019, 2020 and 2021 July and 2022 November . ILD first repored in July 2021   and this the LDCT reported ILD as "progressive" though prior scan no mention of ILD. In my professional opinion: some ILD was there in 2019:. HRCT then done Nov 2022 - reported as prob UI and the May 2023 HRCT reported as altermnative dx. AGree with alternative diagnsos    CT Chest data - HRCT May 2023  Narrative & Impression  CLINICAL DATA:  Follow-up right upper lobe pulmonary nodule   EXAM: CT CHEST WITHOUT CONTRAST   TECHNIQUE: Multidetector CT imaging of the chest was performed following the standard protocol without intravenous contrast. High resolution imaging of the lungs, as well as inspiratory and expiratory imaging, was performed.   RADIATION DOSE REDUCTION: This exam was performed according to the departmental dose-optimization program which includes automated exposure control,  adjustment of the mA and/or kV according to patient size and/or use of iterative reconstruction technique.   COMPARISON:  11/02/2021   FINDINGS: Cardiovascular: Aortic atherosclerosis. Normal heart size. Left and right coronary artery calcifications. No pericardial effusion.   Mediastinum/Nodes: No enlarged mediastinal, hilar, or axillary lymph nodes. Thyroid gland, trachea, and esophagus demonstrate no significant findings.   Lungs/Pleura: Minimal paraseptal emphysema. Subpleural nodule of the anterior right upper lobe new on prior examination is diminished in fullness and conspicuity, with an irregular remnant in this vicinity (series 13, image 109). Additional small pulmonary nodules are unchanged. Unchanged mild pulmonary fibrosis in a pattern with apical to basal gradient featuring irregular peripheral interstitial opacity, ground-glass, and varicoid bronchiectasis without evidence of subpleural bronchiolectasis or honeycombing. No significant air trapping on expiratory phase imaging. No pleural effusion or pneumothorax.   Upper Abdomen: No acute abnormality.   Musculoskeletal: No chest wall abnormality. No suspicious osseous lesions identified.   IMPRESSION: 1. Subpleural nodule of the anterior right upper lobe new on prior examination is diminished in fullness and conspicuity, with an irregular remnant in this vicinity. Findings are consistent nonspecific sequelae of infection or inflammation, for which no further specific follow-up is required. Additional small pulmonary nodules are unchanged. Lung RADS category 2, benign appearance or  behavior. Consider ongoing low-dose CT lung cancer screening if indicated by patient age, smoking history, and/or other risk factors for lung cancer. 2. Unchanged mild pulmonary fibrosis in a pattern with apical to basal gradient featuring irregular peripheral interstitial opacity, ground-glass, and varicoid bronchiectasis without  evidence of subpleural bronchiolectasis or honeycombing. Findings are suggestive of an alternative diagnosis (not UIP) per consensus guidelines: Diagnosis of Idiopathic Pulmonary Fibrosis: An Official ATS/ERS/JRS/ALAT Clinical Practice Guideline. Am Rosezetta Schlatter Crit Care Med Vol 198, Iss 5, [phone], Aug 29 2017. 3. Minimal emphysema. 4. Coronary artery disease.   Aortic Atherosclerosis (ICD10-I70.0) and Emphysema (ICD10-J43.9).     Electronically Signed   By: Jearld Lesch M.D.   On: 05/12/2022 15:42        PFT  OV 10/01/2022  Subjective:  Patient ID: Alison Campbell, female , DOB: [birth year] , age 25 y.o. , MRN: [id number] , ADDRESS: [address] [place] [address] PCP Lucky Cowboy, MD Patient Care Team: Lucky Cowboy, MD as PCP - General (Internal Medicine) Rachael Fee, MD as Attending Physician (Gastroenterology) Mardella Layman, MD as Consulting Physician (Gastroenterology) [name], MD (Inactive) as Consulting Physician (Otolaryngology) Cherlyn Roberts, MD as Consulting Physician (Dermatology) Josephine Igo, DO as Consulting Physician (Pulmonary Disease)  This Provider for this visit: Treatment Team:  Attending Provider: Kalman Shan, MD    10/01/2022 -   Chief Complaint  Patient presents with   Follow-up    Pt recently had a procedure performed by Dr. Dorris Fetch.  Pt states she has been doing okay since last visit. States has some soreness from the procedure that was done.     HPI Alison Campbell 77 y.o. -presents with her daughter Mar Daring who is a Optician, dispensing.  Bonita Quin used to work for [employer] and also [employer].  Patient CT scan had alternative diagnosis to UIP.  Therefore she underwent surgical lung biopsy on 08/15/2022.  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  We discussed several aspects of IPF mainly that is progressive disease with significant variability.   Overall she is stable.  Other than the soreness from the surgery she is fine.  We discussed 3 options of nintedanib versus pirfenidone versus participating in clinical trial with a modified form of pirfenidone.  The summary of this is detailed below.  She absolutely does not want area side effect.  Therefore she not interested in nintedanib.  She is known to have diverticulosis and is concerned this might provoke diverticulitis therefore even more caution against nintedanib.  We discussed pirfenidone with key elements below.  We discussed the [facility] trial along with the concept of clinical trials as a care option.  She is taken a copy of the consent to reflect on what would be the appropriate course for her which would either be take standard of care pirfenidone or participate in the clinical trial.   She quit smoking 09/15/22         PFT  SLB [id number]  SURGICAL PATHOLOGY  CASE: [id number]  PATIENT: Alison Campbell  Surgical Pathology Report      Clinical History: Interstitial lung disease (nt)      FINAL MICROSCOPIC DIAGNOSIS:   A. 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 LUNG, RIGHT UPPER LOBE, RESECTION:  - Minimal to mild interstitial fibrosis with elastosis (see comment)  C. 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 There is evidence of architectural distortion with  peribronchiolar and smooth muscle metaplasia. Multiple fibroblastic foci  are seen along with focal microscopic honeycombing. Granulomas or  increased  eosinophils are not seen. Overall, the distribution and nature  of the interstitial fibrosis in this case are most suggestive of usual  interstitial pneumonia (UIP) pattern. UIP pattern may be seen with  collagen vascular diseases, drug reactions, or as an idiopathic disease  (idiopathic pulmonary fibrosis). Scattered lymphoid aggregates within  the affected areas may favor a collagen vascular disease. Clinical and  radiographic correlation is necessary.    OV 01/19/2023  Subjective:  Patient ID: Alison Campbell, female , DOB: [birth year] , age 19 y.o. , MRN: [id number] , ADDRESS: [address] [place] [address] PCP Lucky Cowboy, MD Patient Care Team: Lucky Cowboy, MD as PCP - General (Internal Medicine) Rachael Fee, MD as Attending Physician (Gastroenterology) Mardella Layman, MD as Consulting Physician (Gastroenterology) [name], MD (Inactive) as Consulting Physician (Otolaryngology) Cherlyn Roberts, MD as Consulting Physician (Dermatology) Josephine Igo, DO as Consulting Physician (Pulmonary Disease)  This Provider for this visit: Treatment Team:  Attending Provider: Kalman Shan, MD   01/19/2023 -   Chief Complaint  Patient presents with   Follow-up    Follow up for IPF. Pt is still continuing the pirfenidone three times a day with no issues noted at this time.      HPI Kriya Jan 77 y.o. -returns for follow-up.  Last seen in the fall 2023.  She continues to do well.  She says symptoms are stable.  Not much shortness of breath.  She is not on oxygen.  She is tolerating pirfenidone really well except for slight diminished appetite but no weight loss.  No nausea no vomiting no diarrhea no GI upset no fatigue no skin rash.  She is relapsed with her smoking.  She is smoking only few cigarettes though.  She says she is trying to cut down.  Last pulmonary function test and high-resolution CT chest was in May 2023.  Her last  set of labs was in October 2023.  I reviewed this.  She needs another set of labs.  Of note she still has some incisional pain from lung biopsy.  It is very mild and tolerable she does not want medicines.  She has upcoming appointment Dr. Dorris Fetch.  We discussed participation in DEXA bone scan research protocol.  She is interested in this.  Pulmonary fibrosis patients might be at high risk for osteoporosis.  She has not had a bone scan in some years.  Given a copy of the consent form.  I also emailed the daughter the consent form.  The daughter is not here with her today.  Her daughter Bonita Quin is now working at [employer] long as a Engineer, civil (consulting).  She is pretty excited about that.      Modified Six Minute Walk - 01/19/23 1000     Type of O2 used  Room Air    Number of laps completed  3    Lap Pace Moderate    Resting Heartrate 80 bpm    Final Heartrate 92 bpm  Resting Pulse Ox 100 %    Desaturated to <= 3 points No    Desaturated to < 88% No    Became tachycardic No    Symptoms  no symptoms noted while walking 3 laps    Was the O2 correction test done? No    comments lab1: 98%/92, lap2:100%/94, lap3:100%/95 and after: 100%/92           OV 05/21/2023  Subjective:  Patient ID: Alison Campbell, female , DOB: [birth year] , age 17 y.o. 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  Mild associated emphysema   Coronary artery calcification -seen cardiology Dr. Mack Hook in 2022   Quit smoking 2023-relapsed as of January 2024.  05/21/2023 -   Chief Complaint  Patient presents with   Follow-up    F/up on PFT and CT scan     HPI Alison Campbell 77 y.o. -returns for follow-up.  She continues to feel stable from a dyspnea standpoint.  Her liver function test in May 2024 was normal.  Her symptom score below shows stability.  She had pulmonary function test and high-resolution CT scan of the chest and these are stable.  In terms of pirfenidone: She feels she is tolerating it well.  She gets a little fatigue after taking it but then it is transient and resolves.  But just as I began to examine her she told me that she has got rash on her bilateral forearms.  She said it happened 4 weeks ago while she was doing some bug spray in the outdoors.  She was sun exposed in her forearms.  There is no itching or burning.  It is punctate rash.  She thinks that that it is a bug bite.  But it has not resolved.  I did explain to her that pirfenidone can cause sun exposed rash.  She does not apply sunscreen because she rarely goes outside.  I did remind her that she needs to wear sunscreen even if she goes out for 5 minutes.  We took a shared decision making that she will stop pirfenidone for 2 weeks and then restart and monitor if the rash goes away.  Lung cancer screening: Recent CT scan of the chest May 2024 high-resolution CT chest for pulmonary fibrosis did not show any evidence of lung cancer  Smoking: We discussed quitting smoking.  She said that she quit for 9 years while living in [facility] but when her kids became teenagers and she relapsed into smoking.  She is now smoking 10 cigarettes/day.  We discussed Chantix and  Zyban she does not want to do this.  She says she will try willpower..  She smokes with the left hand she smokes outdoors.  I advised her to cut down by 1 cigarette/week.  Also try using her right hand and try different  places to smoke. she is going to try some of the strategies. Tital time spent on this >=  Social: Her daughter Bonita Quin had some breast tumors removed fortunately they were benign.  But she also tore her ankle ligaments and is in a brace.  SYMPTOM SCALE - ILD 07/03/2022 10/01/2022  05/21/2023  139#  Current weight     O2 use ra ra ra  Shortness of Breath 0 -> 5 scale with 5 being worst (score 6 If unable to do)    At rest 0 0 0  Simple tasks - showers, clothes change, eating, shaving 1 1 0  Household (dishes, doing bed, laundry) 0 2 1  Shopping 0 2 0  Walking level at own pace 0 3 0  Walking up Stairs 0 1 1  Total (30-36) Dyspnea Score 1 9 2       Non-dyspnea symptoms (0-> 5 scale) 07/03/2022 10/01/2022  05/21/2023    How bad is your cough? 1 0 0  How bad is your fatigue 2 2 2   How bad is nausea 2 0 0  How bad is vomiting?  0 0 0  How bad is diarrhea? 0 00 0  How bad is anxiety? 0 0 0  How bad is depression 0 0 0  Any chronic pain - if so where and how bad 0 0 x   Simple office walk 185 feet x  3 laps goal with forehead probe 07/03/2022    O2 used ra   Number laps completed 3   Comments about pace avg   Resting Pulse Ox/HR 98% and 69/min   Final Pulse Ox/HR 96% and 96/min   Desaturated </= 88% no   Desaturated <= 3% points no   Got Tachycardic >/= 90/min yes   Symptoms at end of test No complaints   Miscellaneous comments x     PFT     Latest Ref Rng & Units 05/19/2023    3:28 PM 05/07/2022    1:36 PM  PFT Results  FVC-Pre L 2.26  P 1.97   FVC-Predicted Pre % 88  P 75   FVC-Post L  2.02   FVC-Predicted Post %  77   Pre FEV1/FVC % % 72  P 82   Post FEV1/FCV % %  77   FEV1-Pre L 1.63  P 1.62   FEV1-Predicted Pre % 85  P 83   FEV1-Post L  1.55    DLCO uncorrected ml/min/mmHg 12.06  P 12.18   DLCO UNC% % 65  P 66   DLCO corrected ml/min/mmHg 12.10  P 11.74   DLCO COR %Predicted % 66  P 63   DLVA Predicted % 88  P 87   TLC L  4.74   TLC % Predicted %  96   RV % Predicted %  118     P Preliminary result   HRCT 05/13/23  Narrative & Impression  CLINICAL DATA:  77 year old female with history of pulmonary fibrosis.   EXAM: CT CHEST WITHOUT CONTRAST   TECHNIQUE: Multidetector CT imaging of the chest was performed following the standard protocol without intravenous contrast. High resolution imaging of the lungs, as well as inspiratory and expiratory imaging, was performed.   RADIATION DOSE REDUCTION: This exam was performed according to the departmental dose-optimization program which includes automated exposure control, adjustment of the mA and/or kV according to patient size and/or use of iterative reconstruction technique.   COMPARISON:  Chest CT 05/09/2022.   FINDINGS: Cardiovascular: Heart  size is normal. There is no significant pericardial fluid, thickening or pericardial calcification. There is aortic atherosclerosis, as well as atherosclerosis of the great vessels of the mediastinum and the coronary arteries, including calcified atherosclerotic plaque in the left main, left anterior descending, left circumflex and right coronary arteries.   Mediastinum/Nodes: No pathologically enlarged mediastinal or hilar lymph nodes. Please note that accurate exclusion of hilar adenopathy is limited on noncontrast CT scans. Esophagus is unremarkable in appearance. No axillary lymphadenopathy.   Lungs/Pleura: High-resolution images again demonstrate patchy areas of very mild peripheral predominant ground-glass attenuation, septal thickening, minimal subpleural reticulation and mild cylindrical bronchiectasis and peripheral bronchiolectasis. No frank honeycombing. No definitive craniocaudal gradient confidently identified,  although findings are most evident in the extreme lung bases. Inspiratory and expiratory imaging demonstrates mild air trapping indicative of very mild small airways disease. No acute consolidative airspace disease. No pleural effusions. Suture lines are noted in the posterior aspect of the right upper lobe and in the base of the right lower lobe from prior excisional lung biopsy. No suspicious appearing pulmonary nodules or masses are noted. Centrilobular and paraseptal emphysema also noted.   Upper Abdomen: Aortic atherosclerosis. Stable 1.2 x 1.1 cm low-attenuation (-3 HU) left adrenal nodule, compatible with a benign adenoma.   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 There are no aggressive appearing lytic or blastic lesions noted in the visualized portions of the skeleton.   IMPRESSION: 1. Stable appearance of the lungs once again categorized as probable usual interstitial pneumonia (UIP) per current ATS guidelines. No progression compared to the prior study. 2. Mild centrilobular and paraseptal emphysema. 3. Aortic atherosclerosis, in addition to left main and three-vessel coronary artery disease. Assessment for potential risk factor modification, dietary therapy or pharmacologic therapy may be warranted, if clinically indicated. 4. Small left adrenal adenoma (benign requiring no future imaging follow-up). 5. Additional incidental findings, as above.   Aortic Atherosclerosis (ICD10-I70.0) and Emphysema (ICD10-J43.9).     Electronically Signed   By: Trudie Reed M.D.   On: 05/16/2023 11:30      Latest Reference Range & Units 05/11/23 11:38  Sodium 135 - 146 mmol/L 143  Potassium 3.5 - 5.3 mmol/L 4.2  Chloride 98 - 110 mmol/L 106  CO2 20 - 32 mmol/L 31  Glucose 65 - 99  mg/dL 161 (H)  Mean Plasma Glucose mg/dL 096  BUN 7 - 25 mg/dL 20  Creatinine 0.45 - 4.09 mg/dL 8.11 (H)  Calcium 8.6 - 10.4 mg/dL 91.4 (H)  BUN/Creatinine Ratio 6 - 22 (calc) 19  eGFR > OR = 60 mL/min/1.37m2 53 (L)  Magnesium 1.5 - 2.5 mg/dL 2.1  AG Ratio 1.0 - 2.5 (calc) 1.7  AST 10 - 35 U/L 15  ALT 6 - 29 U/L 14  Total Protein 6.1 - 8.1 g/dL 6.7  Total Bilirubin 0.2 - 1.2 mg/dL 0.2  Total CHOL/HDL Ratio <5.0 (calc) 4.2  Cholesterol <200 mg/dL 782  HDL Cholesterol > OR = 50 mg/dL 44 (L)  LDL Cholesterol (Calc) mg/dL (calc) 92  Non-HDL Cholesterol (Calc) <130 mg/dL (calc) 956 (H)  Triglycerides <150 mg/dL 213 (H)  Alkaline phosphatase (APISO) 37 - 153 U/L 62  Vitamin D, 25-Hydroxy 30 - 100 ng/mL 108 (H)  Globulin 1.9 - 3.7 g/dL (calc) 2.5  WBC 3.8 - 08.6 Thousand/uL 6.8  RBC 3.80 - 5.10 Million/uL 4.34  Hemoglobin  11.7 - 15.5 g/dL 96.0  HCT 45.4 - 09.8 % 40.1  MCV 80.0 - 100.0 fL 92.4  MCH 27.0 - 33.0 pg 30.6  MCHC 32.0 - 36.0 g/dL 11.9  RDW 14.7 - 82.9 % 12.4  Platelets 140 - 400 Thousand/uL 259  MPV 7.5 - 12.5 fL 11.5  Neutrophils % 68  Monocytes Relative % 5.3  Eosinophil % 1.8  Basophil % 0.6  NEUT# 1,500 - 7,800 cells/uL 4,624  Lymphocyte # 850 - 3,900 cells/uL 1,652  Total Lymphocyte % 24.3  Eosinophils Absolute 15 - 500 cells/uL 122  Basophils Absolute 0 - 200 cells/uL 41  Absolute Monocytes 200 - 950 cells/uL 360  eAG (mmol/L) mmol/L 6.2  Hemoglobin A1C <5.7 % of total Hgb 5.5  INSULIN uIU/mL 37.2 (H)  TSH 0.40 - 4.50 mIU/L 0.74  Albumin MSPROF 3.6 - 5.1 g/dL 4.2  (H): Data is abnormally high (L): Data is abnormally low   has a past medical history of Anxiety, CKD (chronic kidney disease) stage 3, GFR 30-59 ml/min (HCC) (12/24/2020), Colon polyps, Costochondritis, GERD (gastroesophageal reflux disease), Hyperlipidemia, Insomnia, Osteoporosis, and Pre-diabetes.   reports that she quit smoking about 9 months ago. Her smoking use included cigarettes. She  started smoking about 58 years ago. She has a 57.00 pack-year smoking history. She has never used smokeless tobacco.  Past Surgical History:  Procedure Laterality Date   ABDOMINAL HYSTERECTOMY     COLONOSCOPY  09/10/2011   hysterectomy     INTERCOSTAL NERVE BLOCK Right 08/15/2022   Procedure: INTERCOSTAL NERVE BLOCK;  Surgeon: Loreli Slot, MD;  Location: [facility] OR;  Service: Thoracic;  Laterality: Right;   LUNG BIOPSY Right 08/15/2022   Procedure: LUNG BIOPSY;  Surgeon: Loreli Slot, MD;  Location: [facility] OR;  Service: Thoracic;  Laterality: Right;   POLYPECTOMY     UVULECTOMY N/A 09/09/2016   Procedure: PARTIAL UVULECTOMY, direct laryngoscopy;  Surgeon: [name], MD;  Location: [facility];  Service: ENT;  Laterality: N/A;  PARTIAL UVULECTOMY, direct laryngoscopy    Allergies  Allergen Reactions   Fish Oil Diarrhea   Spiriva Respimat [Tiotropium Bromide Monohydrate] Cough    Gaging and excessive phlegm    Immunization History  Administered Date(s) Administered   Fluad Quad(high Dose 65+) 09/25/2022   Influenza, High Dose Seasonal PF 08/29/2013, 10/25/2015, 11/24/2017, 11/29/2018, 10/15/2021   Influenza,inj,Quad PF,6+ Mos 10/31/2022   PFIZER(Purple Top)SARS-COV-2 Vaccination 02/02/2020, 02/28/2020   Pneumococcal Conjugate-13 02/02/2015   Pneumococcal Polysaccharide-23 03/29/2010   Tdap 02/26/2009, 10/31/2022   Zoster, Live 12/29/2008    Family History  Problem Relation Age of Onset   Heart disease Mother    Heart attack Mother    Heart disease Father    Diabetes Father    Heart attack Father    Cancer Brother        Lung   Bipolar disorder Daughter    Colon cancer Neg Hx    Esophageal cancer Neg Hx    Stomach cancer Neg Hx    Rectal cancer Neg Hx      Current Outpatient Medications:    Ascorbic Acid (VITAMIN C) 1000 MG tablet, Take 1,000 mg by mouth daily., Disp: , Rfl:    Calcium Carb-Cholecalciferol (CALCIUM 600 + D PO), Take 2  tablets by mouth daily., Disp: , Rfl:    cholecalciferol (VITAMIN D3) 25 MCG (1000 UNIT) tablet, Take 5,000 Units by mouth daily., Disp: , Rfl:    ezetimibe (ZETIA) 10 MG tablet, TAKE 1 TABLET  BY MOUTH DAILY FOR CHOLESTEROL, Disp: 90 tablet, Rfl: 3   Multiple Vitamin (MULTIVITAMIN PO), Take 1 tablet by mouth daily., Disp: , Rfl:    Pirfenidone 267 MG TABS, Take 2 tablets (534 mg total) by mouth with breakfast, with lunch, and with evening meal., Disp: 180 tablet, Rfl: 3   Psyllium (METAMUCIL FIBER PO), Take 3 tablets by mouth daily., Disp: , Rfl:    rosuvastatin (CRESTOR) 20 MG tablet, Take 1/2 tablet (10 mg) 3 x / week for Cholesterol, Disp: 39 tablet, Rfl: 3   traZODone (DESYREL) 150 MG tablet, Take 1/2 to 1 tablet 1 hour before Bedtime if needed for Sleep                                  /                                 TAKE                                               BY                            MOUTH, Disp: 90 tablet, Rfl: 3      Objective:   Vitals:   05/21/23 1045  BP: 138/70  Pulse: 73  SpO2: 96%  Weight: 139 lb (63 kg)  Height: 5\' 3"  (1.6 m)    Estimated body mass index is 24.62 kg/m as calculated from the following:   Height as of this encounter: 5\' 3"  (1.6 m).   Weight as of this encounter: 139 lb (63 kg).  @WEIGHTCHANGE @  [employer]   05/21/23 1045  Weight: 139 lb (63 kg)     Physical Exam   General: No distress. Look swell O2 at rest: no Cane present: no Sitting in wheel chair: no Frail: no Obese: no Neuro: Alert and Oriented x 3. GCS 15. Speech normal Psych: Pleasant Resp:  Barrel Chest - no.  Wheeze - no, Crackles - YES BASE, No overt respiratory distress CVS: Normal heart sounds. Murmurs - no Ext: Stigmata of Connective Tissue Disease - no HEENT: Normal upper airway. PEERL +. No post nasal drip        Assessment:       ICD-10-CM   1. IPF (idiopathic pulmonary fibrosis) (HCC)  J84.112     2. Encounter for therapeutic drug monitoring   Z51.81     3. Rash, skin  R21     4. Smoker  F17.200          Plan:     Patient Instructions  IPF (idiopathic pulmonary fibrosis) (HCC) Encounter for therapeutic drug monitoring Rash, skin  -Pulmonary fibrosis is stable based on scan and breathing test and symptoms and CT scan.  This is good news. -Liver function test normal on pirfenidone May 2024 -Overall tolerating pirfenidone well except for concern of skin rash [see below] -However, you have new skin rash for the last 4 weeks on your forearms  -Unclear cause and could be because of bug spray or bug bite but could also be from sun exposure in the setting of pirfenidone  Plan -Stop pirfenidone for 2 weeks  -  and then restart with 1 pill 3 times daily with food for 1 week and then go up to 2 pills 3 times daily and stay at that dose  -Reminder for you to apply sunscreen to the skin and sun exposed areas even when you go out for 5 minutes  -Call us in 4 or 6 weeks if the rash is not going away and we will refer you to dermatology -Check liver function test in 3 months  -Repeat spirometry and DLCO in 5 months  Smoker Lung cancer screening  -Still ongoing problem and relapsed when the kids were teenagers - No evidence of lung cancer on recent CT scan chest  Plan  - Detailed discussion and he will use willpower techniques to quit smoking including trying to smoke with the right hand or trying to move to different locations to smoke and trying to cut his cigarette by 1 cigarette/week -Respect not wanting to do Chantix or Zyban   Follow-up -In 4 weeks call us to update [place] about the skin rash -In 3 months check liver function test - 30-minute visit with Dr. Marchelle Gearing in 5 months but after pulmonary function test and CT scan  -Symptom score and exercise hypoxemia test at follow-up    SIGNATURE    Dr. Kalman Shan, M.D., F.C.C.P,  Pulmonary and Critical Care Medicine Staff Physician, [facility]  Director - Interstitial Lung Disease  Program  Pulmonary Fibrosis [facility] at [facility] [place], [place], [address]  Pager: [phone], If no answer or between  15:00h - 7:00h: call [phone] Telephone: [phone]  11:14 AM 05/21/2023

## 2023-05-21 NOTE — Patient Instructions (Addendum)
IPF (idiopathic pulmonary fibrosis) (HCC) Encounter for therapeutic drug monitoring Rash, skin  -Pulmonary fibrosis is stable based on scan and breathing test and symptoms and CT scan.  This is good news. -Liver function test normal on pirfenidone May 2024 -Overall tolerating pirfenidone well except for concern of skin rash [see below] -However, you have new skin rash for the last 4 weeks on your forearms  -Unclear cause and could be because of bug spray or bug bite but could also be from sun exposure in the setting of pirfenidone  Plan -Stop pirfenidone for 2 weeks  -and then restart with 1 pill 3 times daily with food for 1 week and then go up to 2 pills 3 times daily and stay at that dose  -Reminder for you to apply sunscreen to the skin and sun exposed areas even when you go out for 5 minutes  -Call us in 4 or 6 weeks if the rash is not going away and we will refer you to dermatology -Check liver function test in 3 months  -Repeat spirometry and DLCO in 5 months  Smoker Lung cancer screening  -Still ongoing problem and relapsed when the kids were teenagers - No evidence of lung cancer on recent CT scan chest  Plan  - Detailed discussion and he will use willpower techniques to quit smoking including trying to smoke with the right hand or trying to move to different locations to smoke and trying to cut his cigarette by 1 cigarette/week -Respect not wanting to do Chantix or Zyban   Follow-up -In 4 weeks call us to update [place] about the skin rash -In 3 months check liver function test - 30-minute visit with Dr. Marchelle Gearing in 5 months but after pulmonary function test and CT scan  -Symptom score and exercise hypoxemia test at follow-up

## 2023-06-01 ENCOUNTER — Other Ambulatory Visit (HOSPITAL_COMMUNITY): Payer: Self-pay

## 2023-06-22 ENCOUNTER — Other Ambulatory Visit: Payer: Self-pay

## 2023-06-24 ENCOUNTER — Other Ambulatory Visit (HOSPITAL_COMMUNITY): Payer: Self-pay

## 2023-06-25 ENCOUNTER — Other Ambulatory Visit (HOSPITAL_COMMUNITY): Payer: Self-pay

## 2023-07-10 IMAGING — CT CT CHEST HIGH RESOLUTION
2 of 8 series · 12 of 36 positions shown, 15 images · non-contrast
Comparison: 11/02/2021

CLINICAL DATA: Follow-up right upper lobe pulmonary nodule



[Series 6: chest 2.00 br36 s3 cor soft · coronal · 0.59mm/px · 3 of 150 slices shown]
[im 30/150  lung]
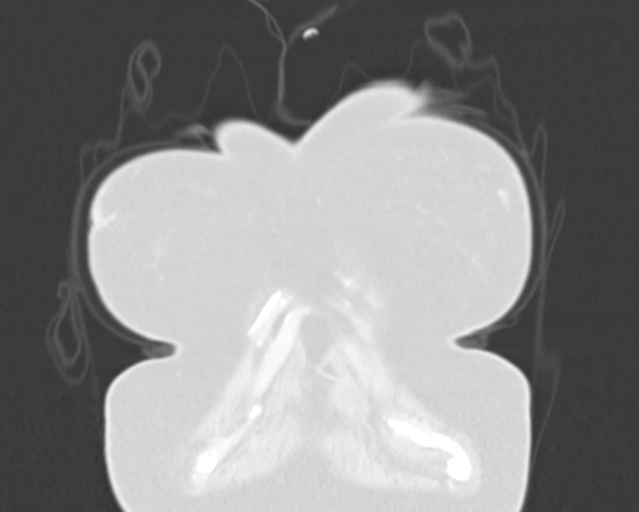
[im 60/150  lung]
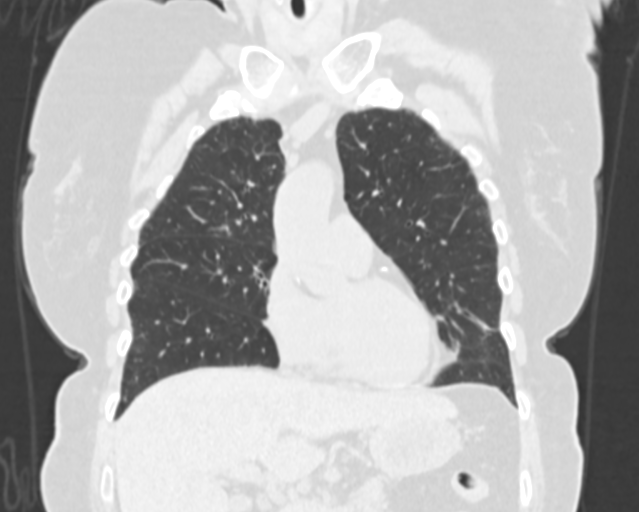
[im 90/150  lung]
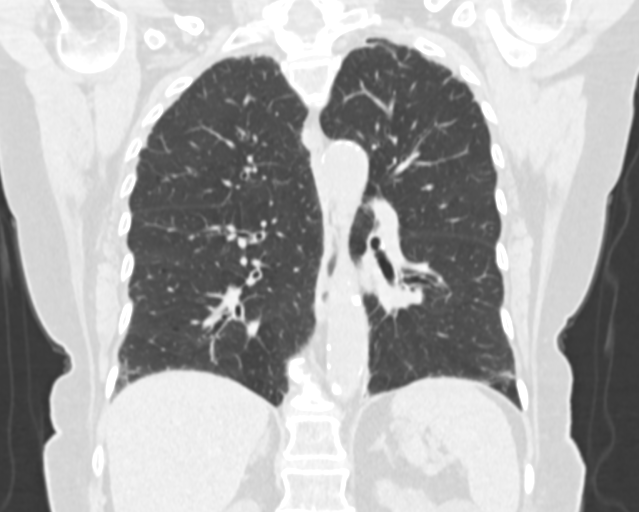

[Series 13: chest 1.00 br60 s3 high res thins 1x1 mm · axial · 0.59mm/px · z∈[+1580,+1820]mm · 9 of 289 slices shown, 12 images]
[im 25/289  mediastinal]
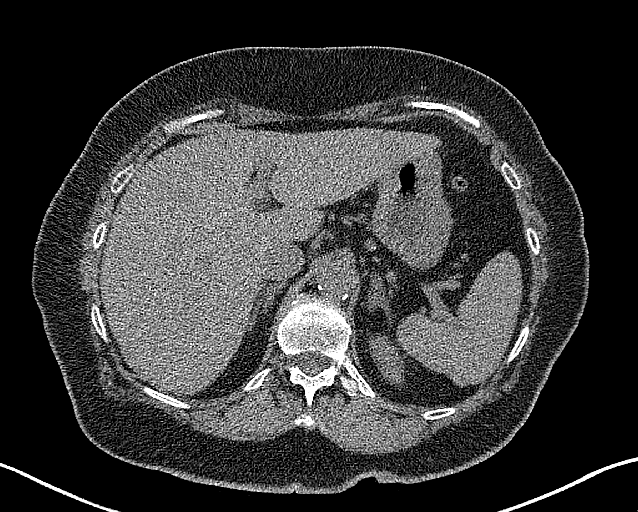
[im 25/289  lung]
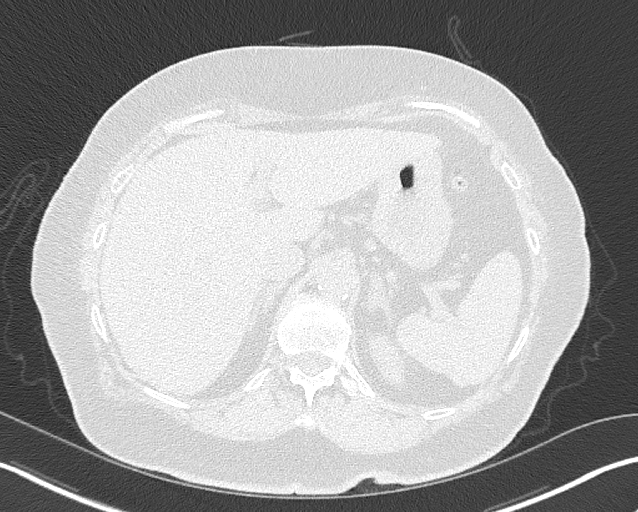
[im 49/289  lung]
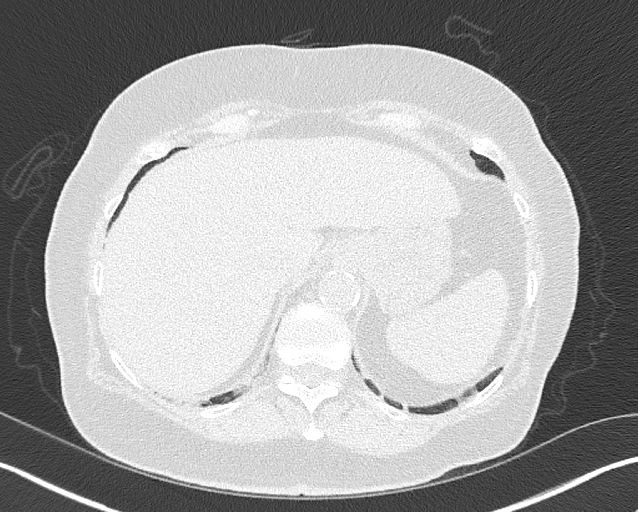
[im 97/289  lung]
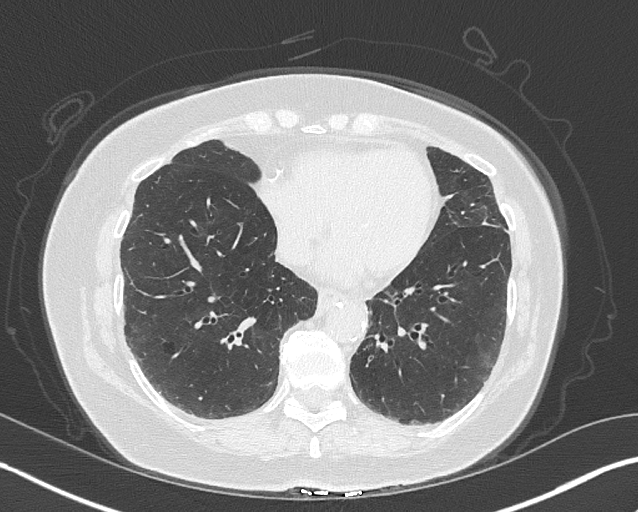
[im 121/289  lung]
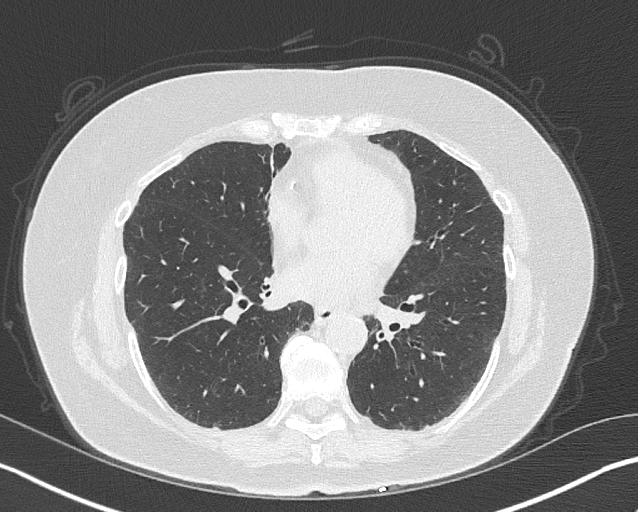
[im 145/289  mediastinal]
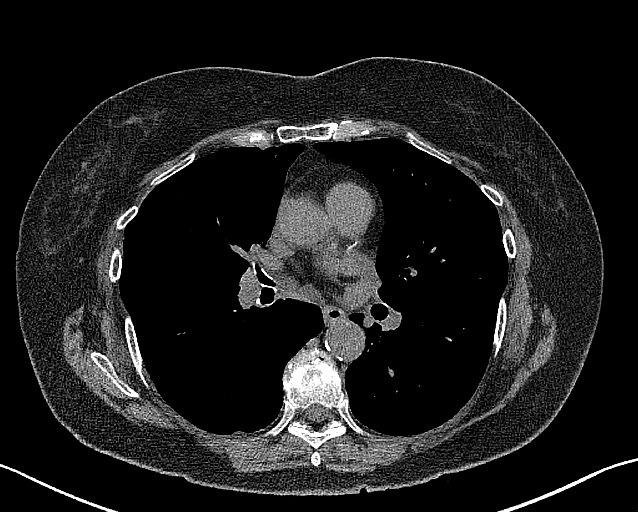
[im 145/289  lung]
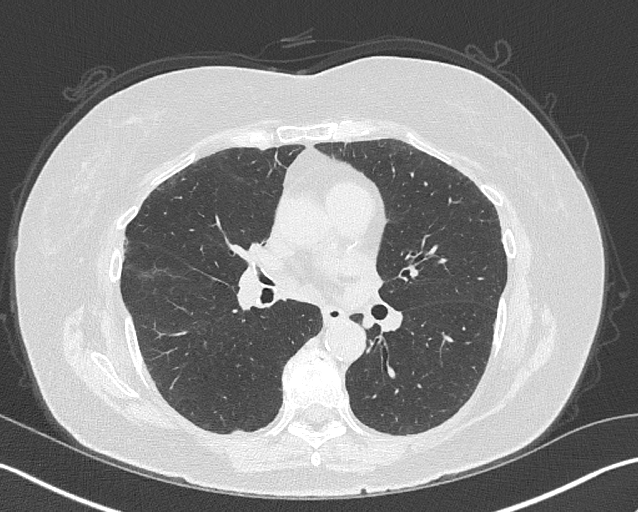
[im 169/289  lung]
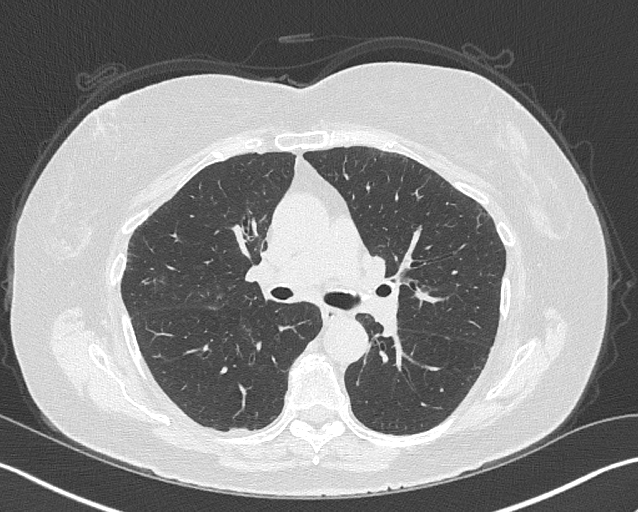
[im 193/289  lung]
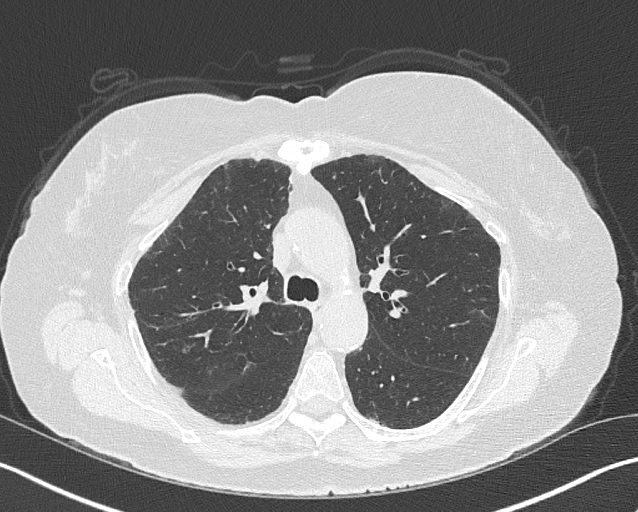
[im 241/289  lung]
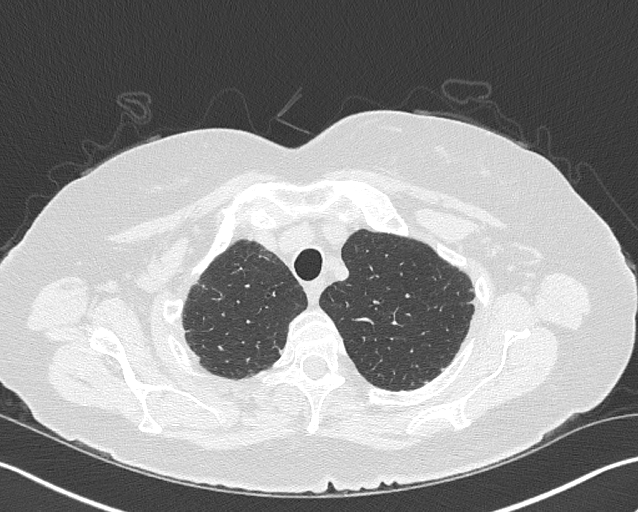
[im 265/289  mediastinal]
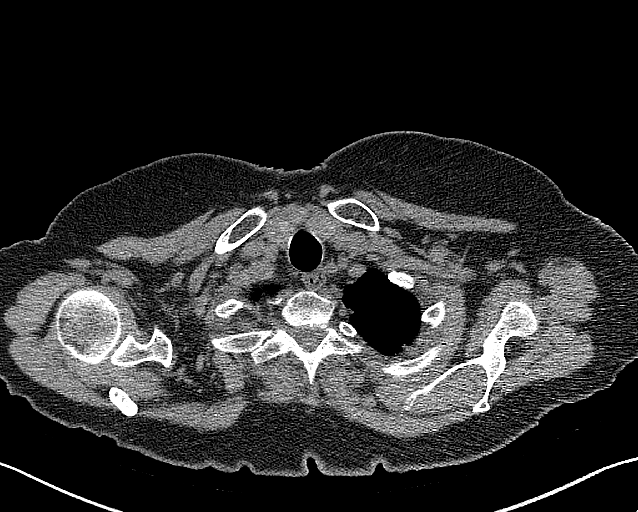
[im 265/289  lung]
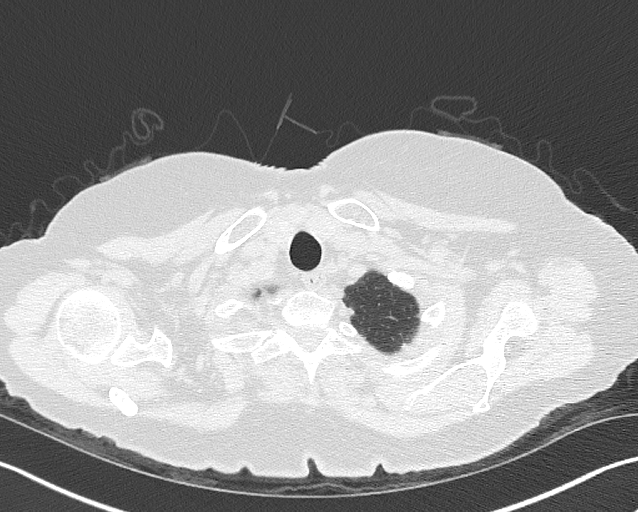

[12 of 36 positions shown; findings below may reference images not displayed]

FINDINGS: Cardiovascular: Aortic atherosclerosis. Normal heart size. Left and
right coronary artery calcifications. No pericardial effusion.

Mediastinum/Nodes: No enlarged mediastinal, hilar, or axillary lymph
nodes. Thyroid gland, trachea, and esophagus demonstrate no
significant findings.

Lungs/Pleura: Minimal paraseptal emphysema. Subpleural nodule of the
anterior right upper lobe new on prior examination is diminished in
fullness and conspicuity, with an irregular remnant in this vicinity
(series 13, image 109). Additional small pulmonary nodules are
unchanged. Unchanged mild pulmonary fibrosis in a pattern with
apical to basal gradient featuring irregular peripheral interstitial
opacity, ground-glass, and varicoid bronchiectasis without evidence
of subpleural bronchiolectasis or honeycombing. No significant air
trapping on expiratory phase imaging. No pleural effusion or
pneumothorax.

Upper Abdomen: No acute abnormality.

Musculoskeletal: No chest wall abnormality. No suspicious osseous
lesions identified.
IMPRESSION: 1. Subpleural nodule of the anterior right upper lobe new on prior
examination is diminished in fullness and conspicuity, with an
irregular remnant in this vicinity. Findings are consistent
nonspecific sequelae of infection or inflammation, for which no
further specific follow-up is required. Additional small pulmonary
nodules are unchanged. Lung RADS category 2, benign appearance or
behavior. Consider ongoing low-dose CT lung cancer screening if
indicated by patient age, smoking history, and/or other risk factors
for lung cancer.
2. Unchanged mild pulmonary fibrosis in a pattern with apical to
basal gradient featuring irregular peripheral interstitial opacity,
ground-glass, and varicoid bronchiectasis without evidence of
subpleural bronchiolectasis or honeycombing. Findings are suggestive
of an alternative diagnosis (not UIP) per consensus guidelines:
Diagnosis of Idiopathic Pulmonary Fibrosis: An Official
ATS/ERS/JRS/ALAT Clinical Practice Guideline. Am J Respir Crit Care
Med Vol 198, Klever 5, ppe22-e[DATE].
3. Minimal emphysema.
4. Coronary artery disease.

Aortic Atherosclerosis (MFI1W-CR5.5) and Emphysema (MFI1W-TY8.2).

## 2023-07-14 ENCOUNTER — Other Ambulatory Visit (HOSPITAL_COMMUNITY): Payer: Self-pay

## 2023-07-21 ENCOUNTER — Ambulatory Visit (HOSPITAL_BASED_OUTPATIENT_CLINIC_OR_DEPARTMENT_OTHER)
Admission: RE | Admit: 2023-07-21 | Discharge: 2023-07-21 | Disposition: A | Discharge status: Home / Self Care | Payer: Medicare Other | Source: Ambulatory Visit | Attending: Internal Medicine | Admitting: Internal Medicine

## 2023-07-21 DIAGNOSIS — R590 Localized enlarged lymph nodes: Secondary | ICD-10-CM | POA: Diagnosis not present

## 2023-07-21 DIAGNOSIS — J84112 Idiopathic pulmonary fibrosis: Secondary | ICD-10-CM | POA: Diagnosis not present

## 2023-07-21 DIAGNOSIS — I700 Atherosclerosis of aorta: Secondary | ICD-10-CM | POA: Diagnosis not present

## 2023-07-21 DIAGNOSIS — E042 Nontoxic multinodular goiter: Secondary | ICD-10-CM | POA: Diagnosis not present

## 2023-07-24 ENCOUNTER — Other Ambulatory Visit: Payer: Self-pay

## 2023-08-19 ENCOUNTER — Other Ambulatory Visit (HOSPITAL_COMMUNITY): Payer: Self-pay

## 2023-08-19 ENCOUNTER — Other Ambulatory Visit: Payer: Self-pay | Admitting: Internal Medicine

## 2023-08-19 DIAGNOSIS — J84112 Idiopathic pulmonary fibrosis: Secondary | ICD-10-CM

## 2023-08-20 ENCOUNTER — Ambulatory Visit: Payer: Medicare Other | Admitting: Nurse Practitioner

## 2023-08-21 ENCOUNTER — Other Ambulatory Visit: Payer: Self-pay | Admitting: Internal Medicine

## 2023-08-21 ENCOUNTER — Other Ambulatory Visit (HOSPITAL_COMMUNITY): Payer: Self-pay

## 2023-08-21 DIAGNOSIS — J84112 Idiopathic pulmonary fibrosis: Secondary | ICD-10-CM

## 2023-08-26 ENCOUNTER — Other Ambulatory Visit (HOSPITAL_COMMUNITY): Payer: Self-pay

## 2023-08-26 ENCOUNTER — Other Ambulatory Visit: Payer: Self-pay

## 2023-08-26 MED ORDER — PIRFENIDONE 267 MG PO TABS
534.0000 mg | ORAL_TABLET | Freq: Three times a day (TID) | ORAL | 1 refills | Status: DC
  Filled 2023-08-26: qty 180, 30d supply, fill #0
  Filled 2023-09-16: qty 180, 30d supply, fill #1
  Filled 2023-10-20: qty 180, 30d supply, fill #2
  Filled 2023-11-30: qty 180, 30d supply, fill #3
  Filled 2024-01-07: qty 180, 30d supply, fill #4
  Filled 2024-02-10: qty 180, 30d supply, fill #5

## 2023-08-26 NOTE — Telephone Encounter (Signed)
Refill sent for PIRFENIDONE to [facility]: [phone]   Dose: 534mg  three times daily  Last OV: 05/21/23 Provider: Dr. Marchelle Gearing Pertinent labs: LFTs wnl on 05/11/23  Next OV: due October 2024 after PFTs and CT  Routing to scheduling team for follow-up on appt scheduling  Chesley Mires, PharmD, MPH, BCPS Clinical Pharmacist (Rheumatology and Pulmonology)

## 2023-09-01 ENCOUNTER — Other Ambulatory Visit: Payer: Medicare Other

## 2023-09-01 DIAGNOSIS — J84112 Idiopathic pulmonary fibrosis: Secondary | ICD-10-CM | POA: Diagnosis not present

## 2023-09-02 LAB — HEPATIC FUNCTION PANEL
ALT: 17 IU/L (ref 0–32)
AST: 17 IU/L (ref 0–40)
Albumin: 4.4 g/dL (ref 3.8–4.8)
Alkaline Phosphatase: 75 IU/L (ref 44–121)
Bilirubin Total: 0.2 mg/dL (ref 0.0–1.2)
Bilirubin, Direct: <0.1 mg/dL (ref 0.00–0.40)
Total Protein: 7 g/dL (ref 6.0–8.5)

## 2023-09-16 ENCOUNTER — Other Ambulatory Visit (HOSPITAL_COMMUNITY): Payer: Self-pay

## 2023-09-25 ENCOUNTER — Telehealth: Payer: Self-pay | Admitting: Pharmacist

## 2023-09-25 NOTE — Telephone Encounter (Signed)
Received notification from [employer] regarding a prior authorization for PIRFENIDONE. Authorization has been APPROVED from 09/25/23 to 09/24/24.  Patient can continue to fill through [facility]: [phone]   Authorization # [id number]  Chesley Mires, PharmD, MPH, BCPS, CPP Clinical Pharmacist (Rheumatology and Pulmonology)

## 2023-09-25 NOTE — Telephone Encounter (Signed)
Submitted a Prior Authorization renewal request to [employer] for PIRFENIDONE via CoverMyMeds. Will update once we receive a response.  Key: [id number]

## 2023-10-20 ENCOUNTER — Other Ambulatory Visit: Payer: Self-pay

## 2023-10-20 NOTE — Progress Notes (Signed)
Specialty Pharmacy Refill Coordination Note  Alison Campbell is a 77 y.o. female contacted today regarding refills of specialty medication(s) Pirfenidone   Patient requested Delivery   Delivery date: 10/27/23   Verified address: [address]  [facility] [place] [id number]   Medication will be filled on 10/26/23.

## 2023-10-21 ENCOUNTER — Encounter: Payer: Medicare Other | Admitting: Internal Medicine

## 2023-10-22 ENCOUNTER — Encounter: Payer: Self-pay | Admitting: Internal Medicine

## 2023-10-22 ENCOUNTER — Ambulatory Visit (INDEPENDENT_AMBULATORY_CARE_PROVIDER_SITE_OTHER): Payer: Medicare Other | Admitting: Internal Medicine

## 2023-10-22 VITALS — BP 118/70 | HR 56 | Temp 97.9°F | Resp 16 | Ht 63.0 in | Wt 136.4 lb

## 2023-10-22 DIAGNOSIS — J42 Unspecified chronic bronchitis: Secondary | ICD-10-CM

## 2023-10-22 DIAGNOSIS — R7309 Other abnormal glucose: Secondary | ICD-10-CM | POA: Diagnosis not present

## 2023-10-22 DIAGNOSIS — Z23 Encounter for immunization: Secondary | ICD-10-CM

## 2023-10-22 DIAGNOSIS — J4489 Other specified chronic obstructive pulmonary disease: Secondary | ICD-10-CM

## 2023-10-22 DIAGNOSIS — R0989 Other specified symptoms and signs involving the circulatory and respiratory systems: Secondary | ICD-10-CM

## 2023-10-22 DIAGNOSIS — E782 Mixed hyperlipidemia: Secondary | ICD-10-CM

## 2023-10-22 DIAGNOSIS — F17200 Nicotine dependence, unspecified, uncomplicated: Secondary | ICD-10-CM | POA: Diagnosis not present

## 2023-10-22 DIAGNOSIS — Z136 Encounter for screening for cardiovascular disorders: Secondary | ICD-10-CM

## 2023-10-22 DIAGNOSIS — Z1211 Encounter for screening for malignant neoplasm of colon: Secondary | ICD-10-CM

## 2023-10-22 DIAGNOSIS — E559 Vitamin D deficiency, unspecified: Secondary | ICD-10-CM | POA: Diagnosis not present

## 2023-10-22 DIAGNOSIS — N1831 Chronic kidney disease, stage 3a: Secondary | ICD-10-CM

## 2023-10-22 DIAGNOSIS — R7303 Prediabetes: Secondary | ICD-10-CM

## 2023-10-22 DIAGNOSIS — Z79899 Other long term (current) drug therapy: Secondary | ICD-10-CM

## 2023-10-22 DIAGNOSIS — I700 Atherosclerosis of aorta: Secondary | ICD-10-CM

## 2023-10-22 NOTE — Progress Notes (Signed)
Annual Screening/Preventative Visit & Comprehensive Evaluation &  Examination   Future Appointments  Date Time Provider Department  10/22/2023                      cpe 10:00 AM Lucky Cowboy, MD [facility]  11/10/2023 10:30 AM Kalman Shan, MD [facility]  02/09/2024                       3 mo ov/wellness 10:00 AM Adela Glimpse, NP [facility]  11/04/2024                        cpe 10:00 AM Lucky Cowboy, MD [facility]         This very nice 77 y.o. WWF with  HTN, HLD, Prediabetes  and Vitamin D Deficiency presents for a Screening /Preventative Visit & comprehensive evaluation and management of multiple medical co-morbidities.  Chest CT scan in July 2021  showed Aortic Atherosclerosis & Coronary Aa calcifications.                                                      Patient has a 50+ yr smoking hx and COPD . Unfortunately, she continues to smoke. Patient is followed by Dr Marchelle Gearing for COPD/Emphysema, Pulmonary fibrosis & pulmonary nodules.         Patient has been followed for years for labile HTN. In 2019, she had a negative Stress Myoview.   Patient does have CKD3a  (GFR 53 ) attributed to her labile HTN. Patient's BP has been controlled at home and patient denies any cardiac symptoms as chest pain, palpitations, shortness of breath, dizziness or ankle swelling. Today's BP is at goal - 118/70 .        Patient' alleges Statin Intolerance and her hyperlipidemia is not controlled with diet and Ezetimibe.  In Jan 2022,  Dr Anne Fu re-added  low dose Rosuvastatin.  Last lipids were  at goal  except elevated Trig's :  Lab Results  Component Value Date   CHOL 108 04/18/2022   HDL 44 (L) 04/18/2022   LDLCALC 36 04/18/2022   TRIG 215 (H) 04/18/2022   CHOLHDL 2.5 04/18/2022         Patient has hx/o prediabetes (A1c 6.0% /2011) and patient denies reactive hypoglycemic symptoms, visual blurring, diabetic polys or paresthesias. Last A1c was normal & at goal :  Lab Results   Component Value Date   HGBA1C 5.4 04/18/2022         Finally, patient has history of Vitamin D Deficiency ("35" /2008 and last Vitamin D was at goal :   Lab Results  Component Value Date   VD25OH 58 10/15/2021       Current Outpatient Medications  Medication Instructions   CALCIUM 600 + D  2 tablets Daily   VITAMIN D    5,000 Units Daily   ezetimibe  10 MG tablet TAKE 1 TABLET  DAILY    Gabapentin  300 mg 2 times daily   Multiple Vitamin 1 tablet Daily,     Pirfenidone (ESBRIET) 267 MG TABS 2 tablets three times daily    METAMUCIL FIBER  3 tablets  Daily   rosuvastatin  20 MG tablet TAKE 1 TABLET  DAILY   traZODone  150 MG  tablet Take 1/3 tab (50 mg) to 1/2 tab (75 mg) to 1 tablet  1 hour  before Bedtime as needed for Sleep   vitamin C  1,000 mg Daily     Allergies  Allergen Reactions   Fish Oil Diarrhea     Past Medical History:  Diagnosis Date   Anxiety    CKD (chronic kidney disease) stage 3, GFR 30-59 ml/min (HCC) 12/24/2020   Colon polyps    Costochondritis    GERD (gastroesophageal reflux disease)    coffee related   Hyperlipidemia    Insomnia    Osteoporosis    Pre-diabetes      Health Maintenance  Topic Date Due   Hepatitis C Screening  Never done   Zoster Vaccines- Shingrix (1 of 2) Never done   TETANUS/TDAP  02/27/2019   COVID-19 Vaccine (3 - Pfizer risk series) 03/27/2020   INFLUENZA VACCINE  07/29/2021   DEXA SCAN  Completed   HPV VACCINES  Aged Out     Immunization History  Administered Date(s) Administered   Influenza, High Dose  10/25/2015, 11/24/2017, 11/29/2018   PFIZER SARS-COV-2 Vacc 02/02/2020, 02/28/2020   Pneumococcal -13 02/02/2015   Pneumococcal -23 03/29/2010   Tdap 02/26/2009   Zoster, Live 12/29/2008    Last Colon - 01/09/2020 - Dr Christella Hartigan recc 3 year f/u due Jan 2024 - overdue    ( sent letter 02/18/2023 by Dr Meridee Score )   Last MGM - 07/09/2020 - Overdue & encouraged to schedule    Past Surgical History:   Procedure Laterality Date   ABDOMINAL HYSTERECTOMY     COLONOSCOPY  09/10/2011   hysterectomy     POLYPECTOMY     UVULECTOMY N/A 09/09/2016   Procedure: PARTIAL UVULECTOMY, direct laryngoscopy;  Surgeon: [name], MD;  Location: [facility];  Service: ENT;  Laterality: N/A;  PARTIAL UVULECTOMY, direct laryngoscopy     Family History  Problem Relation Age of Onset   Heart disease Mother    Heart attack Mother    Heart disease Father    Diabetes Father    Heart attack Father    Cancer Brother        Lung   Bipolar disorder Daughter    Colon cancer Neg Hx    Esophageal cancer Neg Hx    Stomach cancer Neg Hx    Rectal cancer Neg Hx      Social History   Tobacco Use   Smoking status: Every Day    Packs/day: 0.50    Years: 50.00    Pack years: 25.00    Types: Cigarettes   Smokeless tobacco: Never  Vaping Use   Vaping Use: Never used  Substance Use Topics   Alcohol use: No    Alcohol/week: 0.0 standard drinks   Drug use: No      ROS Constitutional: Denies fever, chills, weight loss/gain, headaches, insomnia,  night sweats, and change in appetite. Does c/o fatigue. Eyes: Denies redness, blurred vision, diplopia, discharge, itchy, watery eyes.  ENT: Denies discharge, congestion, post nasal drip, epistaxis, sore throat, earache, hearing loss, dental pain, Tinnitus, Vertigo, Sinus pain, snoring.  Cardio: Denies chest pain, palpitations, irregular heartbeat, syncope, dyspnea, diaphoresis, orthopnea, PND, claudication, edema Respiratory: denies cough, dyspnea, DOE, pleurisy, hoarseness, laryngitis, wheezing.  Gastrointestinal: Denies dysphagia, heartburn, reflux, water brash, pain, cramps, nausea, vomiting, bloating, diarrhea, constipation, hematemesis, melena, hematochezia, jaundice, hemorrhoids Genitourinary: Denies dysuria, frequency, urgency, nocturia, hesitancy, discharge, hematuria, flank pain Breast: Breast lumps, nipple discharge, bleeding.  Musculoskeletal: Denies arthralgia, myalgia, stiffness, Jt. Swelling, pain, limp, and strain/sprain. Denies falls. Skin: Denies puritis, rash, hives, warts, acne, eczema, changing in skin lesion Neuro: No weakness, tremor, incoordination, spasms, paresthesia, pain Psychiatric: Denies confusion, memory loss, sensory loss. Denies Depression. Endocrine: Denies change in weight, skin, hair change, nocturia, and paresthesia, diabetic polys, visual blurring, hyper / hypo glycemic episodes.  Heme/Lymph: No excessive bleeding, bruising, enlarged lymph nodes.  Physical Exam  BP 118/70   Pulse (!) 56   Temp 97.9 F (36.6 C)   Resp 16   Ht 5\' 3"  (1.6 m)   Wt 136 lb 6.4 oz (61.9 kg)   SpO2 97%   BMI 24.16 kg/m   General Appearance: Well nourished, well groomed and in no apparent distress.  Eyes: PERRLA, EOMs, conjunctiva no swelling or erythema, normal fundi and vessels. Sinuses: No frontal/maxillary tenderness ENT/Mouth: EACs patent / TMs  nl. Nares clear without erythema, swelling, mucoid exudates. Oral hygiene is good. No erythema, swelling, or exudate. Tongue normal, non-obstructing. Tonsils not swollen or erythematous. Hearing normal.  Neck: Supple, thyroid not palpable. No bruits, nodes or JVD. Respiratory: Respiratory effort normal.  BS equal and clear bilateral without rales, rhonci, wheezing or stridor. Cardio: Heart sounds are normal with regular rate and rhythm and no murmurs, rubs or gallops. Peripheral pulses are normal and equal bilaterally without edema. No aortic or femoral bruits. Chest: symmetric with normal excursions and percussion. Breasts: Symmetric, without lumps, nipple discharge, retractions, or fibrocystic changes.  Abdomen: Flat, soft with bowel sounds active. Nontender, no guarding, rebound, hernias, masses, or organomegaly.  Lymphatics: Non tender without lymphadenopathy.  Genitourinary:  Musculoskeletal: Full ROM all peripheral extremities, joint stability, 5/5  strength, and normal gait. Skin: Warm and dry without rashes, lesions, cyanosis, clubbing or  ecchymosis.  Neuro: Cranial nerves intact, reflexes equal bilaterally. Normal muscle tone, no cerebellar symptoms. Sensation intact.  Pysch: Alert and oriented X 3, normal affect, Insight and Judgment appropriate.    Assessment and Plan  1. Labile hypertension  - EKG 12-Lead - Urinalysis, Routine w reflex microscopic - Microalbumin / creatinine urine ratio - CBC with Differential/Platelet - COMPLETE METABOLIC PANEL WITH GFR - Magnesium - TSH   2. Hyperlipidemia, mixed  - EKG 12-Lead - Lipid panel - TSH   3. Abnormal glucose  - EKG 12-Lead - Hemoglobin A1c - Insulin, random   4. Vitamin D deficiency  - VITAMIN D 25 Hydroxy    5. Aortic atherosclerosis (HCC) by CT Lung Scan in July 2021  - EKG 12-Lead - Lipid panel   6. Prediabetes  - Hemoglobin A1c - Insulin, random   7. Stage 3a chronic kidney disease (HCC)  - Urinalysis, Routine w reflex microscopic - Parathyroid hormone, intact (no Ca) - COMPLETE METABOLIC PANEL WITH GFR   8. Obstructive chronic bronchitis (HCC)   9. Chronic bronchitis   (HCC)   10. Screening for colorectal cancer  - POC Hemoccult Bld/Stl  11. Screening for heart disease  - EKG 12-Lead   12. Smoker  - EKG 12-Lead   13. Medication management  - Urinalysis, Routine w reflex microscopic - Microalbumin / creatinine urine ratio - CBC with Differential/Platelet - COMPLETE METABOLIC PANEL WITH GFR - Magnesium - Lipid panel - TSH - Hemoglobin A1c - Insulin, random - VITAMIN D 25 Hydroxy        Patient was counseled in prudent diet to achieve/maintain BMI less than 25 for weight control, BP monitoring, regular exercise and medications. Discussed med's effects and SE's.  Screening labs and tests as requested with regular follow-up as recommended. Over 40 minutes of exam, counseling, chart review and high complex critical  decision making was performed.   Marinus Maw, MD

## 2023-10-22 NOTE — Patient Instructions (Addendum)
Due to recent changes in healthcare laws, you may see the results of your imaging and laboratory studies on MyChart before your provider has had a chance to review them.  We understand that in some cases there may be results that are confusing or concerning to you. Not all laboratory results come back in the same time frame and the provider may be waiting for multiple results in order to interpret others.  Please give [place] 48 hours in order for your provider to thoroughly review all the results before contacting the office for clarification of your results.   +++++++++++++++++++++++++  Vit D  & Vit C 1,000 mg   are recommended to help protect  against the Covid-19 and other Corona viruses.    Also it's recommended  to take  Zinc 50 mg x 1/2 tablet = 25 mg  /day  to help  protect against the Covid-19   and best place to get  is also on [email or web address]  and don't pay more than 6-8 cents /pill !  ================================ Coronavirus (COVID-19) Are you at risk?  Are you at risk for the Coronavirus (COVID-19)?  To be considered HIGH RISK for Coronavirus (COVID-19), you have to meet the following criteria:  Traveled to [place], [place], [place], [place] or [place]; or in the [place] to [place], [place], [place]  or [place]; and have fever, cough, and shortness of breath within the last 2 weeks of travel OR Been in close contact with a person diagnosed with COVID-19 within the last 2 weeks and have  fever, cough,and shortness of breath  IF YOU DO NOT MEET THESE CRITERIA, YOU ARE CONSIDERED LOW RISK FOR COVID-19.  What to do if you are HIGH RISK for COVID-19?  If you are having a medical emergency, call 911. Seek medical care right away. Before you go to a doctor's office, urgent care or emergency department,  call ahead and tell them about your recent travel, contact with someone diagnosed with COVID-19   and your symptoms.  You should receive instructions from your  physician's office regarding next steps of care.  When you arrive at healthcare provider, tell the healthcare staff immediately you have returned from  visiting [place], [place], [place], [place] or [place]; or traveled in the [place] to [place], [place],  [place] or [place] in the last two weeks or you have been in close contact with a person diagnosed with  COVID-19 in the last 2 weeks.   Tell the health care staff about your symptoms: fever, cough and shortness of breath. After you have been seen by a medical provider, you will be either: Tested for (COVID-19) and discharged home on quarantine except to seek medical care if  symptoms worsen, and asked to  Stay home and avoid contact with others until you get your results (4-5 days)  Avoid travel on public transportation if possible (such as bus, train, or airplane) or Sent to the Emergency Department by EMS for evaluation, COVID-19 testing  and  possible admission depending on your condition and test results.  What to do if you are LOW RISK for COVID-19?  Reduce your risk of any infection by using the same precautions used for avoiding the common cold or flu:  Wash your hands often with soap and warm water for at least 20 seconds.  If soap and water are not readily available,  use an alcohol-based hand sanitizer with at least 60% alcohol.  If coughing or  sneezing, cover your mouth and nose by coughing or sneezing into the elbow areas of your shirt or coat,  into a tissue or into your sleeve (not your hands). Avoid shaking hands with others and consider head nods or verbal greetings only. Avoid touching your eyes, nose, or mouth with unwashed hands.  Avoid close contact with people who are sick. Avoid places or events with large numbers of people in one location, like concerts or sporting events. Carefully consider travel plans you have or are making. If you are planning any travel outside or inside the [place], visit the CDC's  Travelers' Health webpage for the latest health notices. If you have some symptoms but not all symptoms, continue to monitor at home and seek medical attention  if your symptoms worsen. If you are having a medical emergency, call 911.   >>>>>>>>>>>>>>>>>>>>>>>>>>>>>>>>>  We Do NOT Approve of [employer] > > > > > > > > > > > > > > > > > > > > > > > > > > > > > > > > > > > > > > >  Preventive Care for Adults  A healthy lifestyle and preventive care can promote health and wellness. Preventive health guidelines for women include the following key practices. A routine yearly physical is a good way to check with your health care provider about your health and preventive screening. It is a chance to share any concerns and updates on your health and to receive a thorough exam. Visit your dentist for a routine exam and preventive care every 6 months. Brush your teeth twice a day and floss once a day. Good oral hygiene prevents tooth decay and gum disease. The frequency of eye exams is based on your age, health, family medical history, use of contact lenses, and other factors. Follow your health care provider's recommendations for frequency of eye exams. Eat a healthy diet. Foods like vegetables, fruits, whole grains, low-fat dairy products, and lean protein foods contain the nutrients you need without too many calories. Decrease your intake of foods high in solid fats, added sugars, and salt. Eat the right amount of calories for you. Get information about a proper diet from your health care provider, if necessary. Regular physical exercise is one of the most important things you can do for your health. Most adults should get at least 150 minutes of moderate-intensity exercise (any activity that increases your heart rate and causes you to sweat) each week. In addition, most adults need muscle-strengthening exercises on 2 or more days a week. Maintain a healthy weight. The body mass index (BMI) is a  screening tool to identify possible weight problems. It provides an estimate of body fat based on height and weight. Your health care provider can find your BMI and can help you achieve or maintain a healthy weight. For adults 20 years and older: A BMI below 18.5 is considered underweight. A BMI of 18.5 to 24.9 is normal. A BMI of 25 to 29.9 is considered overweight. A BMI of 30 and above is considered obese. Maintain normal blood lipids and cholesterol levels by exercising and minimizing your intake of saturated fat. Eat a balanced diet with plenty of fruit and vegetables. If your lipid or cholesterol levels are high, you are over 50, or you are at high risk for heart disease, you may need your cholesterol levels checked more frequently. Ongoing high lipid and cholesterol levels should be treated with medicines if diet and exercise are  not working. If you smoke, find out from your health care provider how to quit. If you do not use tobacco, do not start. Lung cancer screening is recommended for adults aged 69-80 years who are at high risk for developing lung cancer because of a history of smoking. A yearly low-dose CT scan of the lungs is recommended for people who have at least a 30-pack-year history of smoking and are a current smoker or have quit within the past 15 years. A pack year of smoking is smoking an average of 1 pack of cigarettes a day for 1 year (for example: 1 pack a day for 30 years or 2 packs a day for 15 years). Yearly screening should continue until the smoker has stopped smoking for at least 15 years. Yearly screening should be stopped for people who develop a health problem that would prevent them from having lung cancer treatment. Avoid use of street drugs. Do not share needles with anyone. Ask for help if you need support or instructions about stopping the use of drugs. High blood pressure causes heart disease and increases the risk of stroke.  Ongoing high blood pressure should be  treated with medicines if weight loss and exercise do not work. If you are 41-54 years old, ask your health care provider if you should take aspirin to prevent strokes. Diabetes screening involves taking a blood sample to check your fasting blood sugar level. This should be done once every 3 years, after age 8, if you are within normal weight and without risk factors for diabetes. Testing should be considered at a younger age or be carried out more frequently if you are overweight and have at least 1 risk factor for diabetes. Breast cancer screening is essential preventive care for women. You should practice "breast self-awareness." This means understanding the normal appearance and feel of your breasts and may include breast self-examination. Any changes detected, no matter how small, should be reported to a health care provider. Women in their 15s and 30s should have a clinical breast exam (CBE) by a health care provider as part of a regular health exam every 1 to 3 years. After age 80, women should have a CBE every year. Starting at age 33, women should consider having a mammogram (breast X-ray test) every year. Women who have a family history of breast cancer should talk to their health care provider about genetic screening. Women at a high risk of breast cancer should talk to their health care providers about having an MRI and a mammogram every year. Breast cancer gene (BRCA)-related cancer risk assessment is recommended for women who have family members with BRCA-related cancers. BRCA-related cancers include breast, ovarian, tubal, and peritoneal cancers. Having family members with these cancers may be associated with an increased risk for harmful changes (mutations) in the breast cancer genes BRCA1 and BRCA2. Results of the assessment will determine the need for genetic counseling and BRCA1 and BRCA2 testing. Routine pelvic exams to screen for cancer are no longer recommended for nonpregnant women who  are considered low risk for cancer of the pelvic organs (ovaries, uterus, and vagina) and who do not have symptoms. Ask your health care provider if a screening pelvic exam is right for you. If you have had past treatment for cervical cancer or a condition that could lead to cancer, you need Pap tests and screening for cancer for at least 20 years after your treatment. If Pap tests have been discontinued, your risk factors (such as  having a new sexual partner) need to be reassessed to determine if screening should be resumed. Some women have medical problems that increase the chance of getting cervical cancer. In these cases, your health care provider may recommend more frequent screening and Pap tests.  Colorectal cancer can be detected and often prevented. Most routine colorectal cancer screening begins at the age of 71 years and continues through age 40 years. However, your health care provider may recommend screening at an earlier age if you have risk factors for colon cancer. On a yearly basis, your health care provider may provide home test kits to check for hidden blood in the stool. Use of a small camera at the end of a tube, to directly examine the colon (sigmoidoscopy or colonoscopy), can detect the earliest forms of colorectal cancer. Talk to your health care provider about this at age 77, when routine screening begins.  Direct exam of the colon should be repeated every 5-10 years through age 44 years, unless early forms of pre-cancerous polyps or small growths are found. Osteoporosis is a disease in which the bones lose minerals and strength with aging. This can result in serious bone fractures or breaks. The risk of osteoporosis can be identified using a bone density scan. Women ages 43 years and over and women at risk for fractures or osteoporosis should discuss screening with their health care providers. Ask your health care provider whether you should take a calcium supplement or vitamin D to  reduce the rate of osteoporosis. Menopause can be associated with physical symptoms and risks. Hormone replacement therapy is available to decrease symptoms and risks. You should talk to your health care provider about whether hormone replacement therapy is right for you. Use sunscreen. Apply sunscreen liberally and repeatedly throughout the day. You should seek shade when your shadow is shorter than you. Protect yourself by wearing long sleeves, pants, a wide-brimmed hat, and sunglasses year round, whenever you are outdoors. Once a month, do a whole body skin exam, using a mirror to look at the skin on your back. Tell your health care provider of new moles, moles that have irregular borders, moles that are larger than a pencil eraser, or moles that have changed in shape or color. Stay current with required vaccines (immunizations). Influenza vaccine. All adults should be immunized every year. Tetanus, diphtheria, and acellular pertussis (Td, Tdap) vaccine. Pregnant women should receive 1 dose of Tdap vaccine during each pregnancy. The dose should be obtained regardless of the length of time since the last dose. Immunization is preferred during the 27th-36th week of gestation. An adult who has not previously received Tdap or who does not know her vaccine status should receive 1 dose of Tdap. This initial dose should be followed by tetanus and diphtheria toxoids (Td) booster doses every 10 years. Adults with an unknown or incomplete history of completing a 3-dose immunization series with Td-containing vaccines should begin or complete a primary immunization series including a Tdap dose. Adults should receive a Td booster every 10 years.  Zoster vaccine. One dose is recommended for adults aged 67 years or older unless certain conditions are present.  Pneumococcal 13-valent conjugate (PCV13) vaccine. When indicated, a person who is uncertain of her immunization history and has no record of immunization should  receive the PCV13 vaccine. An adult aged 79 years or older who has certain medical conditions and has not been previously immunized should receive 1 dose of PCV13 vaccine. This PCV13 should be followed with a  dose of pneumococcal polysaccharide (PPSV23) vaccine. The PPSV23 vaccine dose should be obtained at least 1 or more year(s) after the dose of PCV13 vaccine. An adult aged 32 years or older who has certain medical conditions and previously received 1 or more doses of PPSV23 vaccine should receive 1 dose of PCV13. The PCV13 vaccine dose should be obtained 1 or more years after the last PPSV23 vaccine dose.  Pneumococcal polysaccharide (PPSV23) vaccine. When PCV13 is also indicated, PCV13 should be obtained first. All adults aged 15 years and older should be immunized. An adult younger than age 46 years who has certain medical conditions should be immunized. Any person who resides in a nursing home or long-term care facility should be immunized. An adult smoker should be immunized. People with an immunocompromised condition and certain other conditions should receive both PCV13 and PPSV23 vaccines. People with human immunodeficiency virus (HIV) infection should be immunized as soon as possible after diagnosis. Immunization during chemotherapy or radiation therapy should be avoided. Routine use of PPSV23 vaccine is not recommended for American Indians, [place] Natives, or people younger than 65 years unless there are medical conditions that require PPSV23 vaccine. When indicated, people who have unknown immunization and have no record of immunization should receive PPSV23 vaccine. One-time revaccination 5 years after the first dose of PPSV23 is recommended for people aged 19-64 years who have chronic kidney failure, nephrotic syndrome, asplenia, or immunocompromised conditions. People who received 1-2 doses of PPSV23 before age 50 years should receive another dose of PPSV23 vaccine at age 36 years or later if at  least 5 years have passed since the previous dose. Doses of PPSV23 are not needed for people immunized with PPSV23 at or after age 33 years.  Preventive Services / Frequency  Ages 32 years and over Blood pressure check. Lipid and cholesterol check. Lung cancer screening. / Every year if you are aged 6-80 years and have a 30-pack-year history of smoking and currently smoke or have quit within the past 15 years. Yearly screening is stopped once you have quit smoking for at least 15 years or develop a health problem that would prevent you from having lung cancer treatment. Clinical breast exam.** / Every year after age 30 years.  BRCA-related cancer risk assessment.** / For women who have family members with a BRCA-related cancer (breast, ovarian, tubal, or peritoneal cancers). Mammogram.** / Every year beginning at age 63 years and continuing for as long as you are in good health. Consult with your health care provider. Pap test.** / Every 3 years starting at age 41 years through age 34 or 84 years with 3 consecutive normal Pap tests. Testing can be stopped between 65 and 70 years with 3 consecutive normal Pap tests and no abnormal Pap or HPV tests in the past 10 years. Fecal occult blood test (FOBT) of stool. / Every year beginning at age 29 years and continuing until age 47 years. You may not need to do this test if you get a colonoscopy every 10 years. Flexible sigmoidoscopy or colonoscopy.** / Every 5 years for a flexible sigmoidoscopy or every 10 years for a colonoscopy beginning at age 60 years and continuing until age 36 years. Hepatitis C blood test.** / For all people born from [birth year] through 1967 and any individual with known risks for hepatitis C. Osteoporosis screening.** / A one-time screening for women ages 70 years and over and women at risk for fractures or osteoporosis. Skin self-exam. / Monthly. Influenza vaccine. / Every  year. Tetanus, diphtheria, and acellular pertussis (Tdap/Td)  vaccine.** / 1 dose of Td every 10 years. Zoster vaccine.** / 1 dose for adults aged 25 years or older. Pneumococcal 13-valent conjugate (PCV13) vaccine.** / Consult your health care provider. Pneumococcal polysaccharide (PPSV23) vaccine.** / 1 dose for all adults aged 43 years and older. Screening for abdominal aortic aneurysm (AAA)  by ultrasound is recommended for people who have history of high blood pressure or who are current or former smokers. ++++++++++++++++++++ Recommend Adult Low Dose Aspirin or  coated  Aspirin 81 mg daily  To reduce risk of Colon Cancer 40 %,  Skin Cancer 26 % ,  Melanoma 46%  and  Pancreatic cancer 60% ++++++++++++++++++++ Vitamin D goal  is between 70-100.  Please make sure that you are taking your Vitamin D as directed.  It is very important as a natural anti-inflammatory  helping hair, skin, and nails, as well as reducing stroke and heart attack risk.  It helps your bones and helps with mood. It also decreases numerous cancer risks so please take it as directed.  Low Vit D is associated with a 200-300% higher risk for CANCER  and 200-300% higher risk for HEART   ATTACK  &  STROKE.   .....................................Marland Kitchen It is also associated with higher death rate at younger ages,  autoimmune diseases like Rheumatoid arthritis, Lupus, Multiple Sclerosis.    Also many other serious conditions, like depression, Alzheimer's Dementia, infertility, muscle aches, fatigue, fibromyalgia - just to name a few. ++++++++++++++++++ Recommend the book "The END of DIETING" by Dr Excell Seltzer  & the book "The END of DIABETES " by Dr Excell Seltzer At [email or web address] - get book & Audio CD's    Being diabetic has a  300% increased risk for heart attack, stroke, cancer, and alzheimer- type vascular dementia. It is very important that you work harder with diet by avoiding all foods that are white. Avoid white rice (brown & wild rice is OK), white potatoes (sweetpotatoes in  moderation is OK), White bread or wheat bread or anything made out of white flour like bagels, donuts, rolls, buns, biscuits, cakes, pastries, cookies, pizza crust, and pasta (made from white flour & egg whites) - vegetarian pasta or spinach or wheat pasta is OK. Multigrain breads like Arnold's or [employer], or multigrain sandwich thins or flatbreads.  Diet, exercise and weight loss can reverse and cure diabetes in the early stages.  Diet, exercise and weight loss is very important in the control and prevention of complications of diabetes which affects every system in your body, ie. Brain - dementia/stroke, eyes - glaucoma/blindness, heart - heart attack/heart failure, kidneys - dialysis, stomach - gastric paralysis, intestines - malabsorption, nerves - severe painful neuritis, circulation - gangrene & loss of a leg(s), and finally cancer and Alzheimers.    I recommend avoid fried & greasy foods,  sweets/candy, white rice (brown or wild rice or Quinoa is OK), white potatoes (sweet potatoes are OK) - anything made from white flour - bagels, doughnuts, rolls, buns, biscuits,white and wheat breads, pizza crust and traditional pasta made of white flour & egg white(vegetarian pasta or spinach or wheat pasta is OK).  Multi-grain bread is OK - like multi-grain flat bread or sandwich thins. Avoid alcohol in excess. Exercise is also important.    Eat all the vegetables you want - avoid meat, especially red meat and dairy - especially cheese.  Cheese is the most concentrated form of trans-fats which is the worst thing  to clog up our arteries. Veggie cheese is OK which can be found in the fresh produce section at [employer] or [employer] or [employer]  +++++++++++++++++++ DASH Eating Plan  DASH stands for "Dietary Approaches to Stop Hypertension."   The DASH eating plan is a healthy eating plan that has been shown to reduce high blood pressure (hypertension). Additional health benefits may include reducing  the risk of type 2 diabetes mellitus, heart disease, and stroke. The DASH eating plan may also help with weight loss. WHAT DO I NEED TO KNOW ABOUT THE DASH EATING PLAN? For the DASH eating plan, you will follow these general guidelines: Choose foods with a percent daily value for sodium of less than 5% (as listed on the food label). Use salt-free seasonings or herbs instead of table salt or sea salt. Check with your health care provider or pharmacist before using salt substitutes. Eat lower-sodium products, often labeled as "lower sodium" or "no salt added." Eat fresh foods. Eat more vegetables, fruits, and low-fat dairy products. Choose whole grains. Look for the word "whole" as the first word in the ingredient list. Choose fish  Limit sweets, desserts, sugars, and sugary drinks. Choose heart-healthy fats. Eat veggie cheese  Eat more home-cooked food and less restaurant, buffet, and fast food. Limit fried foods. Cook foods using methods other than frying. Limit canned vegetables. If you do use them, rinse them well to decrease the sodium. When eating at a restaurant, ask that your food be prepared with less salt, or no salt if possible.                      WHAT FOODS CAN I EAT? Read Dr Fara Olden Fuhrman's books on The End of Dieting & The End of Diabetes  Grains Whole grain or whole wheat bread. Brown rice. Whole grain or whole wheat pasta. Quinoa, bulgur, and whole grain cereals. Low-sodium cereals. Corn or whole wheat flour tortillas. Whole grain cornbread. Whole grain crackers. Low-sodium crackers.  Vegetables Fresh or frozen vegetables (raw, steamed, roasted, or grilled). Low-sodium or reduced-sodium tomato and vegetable juices. Low-sodium or reduced-sodium tomato sauce and paste. Low-sodium or reduced-sodium canned vegetables.   Fruits All fresh, canned (in natural juice), or frozen fruits.  Protein Products  All fish and seafood.  Dried beans, peas, or lentils. Unsalted nuts and  seeds. Unsalted canned beans.  Dairy Low-fat dairy products, such as skim or 1% milk, 2% or reduced-fat cheeses, low-fat ricotta or cottage cheese, or plain low-fat yogurt. Low-sodium or reduced-sodium cheeses.  Fats and Oils Tub margarines without trans fats. Light or reduced-fat mayonnaise and salad dressings (reduced sodium). Avocado. Safflower, olive, or canola oils. Natural peanut or almond butter.  Other Unsalted popcorn and pretzels. The items listed above may not be a complete list of recommended foods or beverages. Contact your dietitian for more options.  +++++++++++++++  WHAT FOODS ARE NOT RECOMMENDED? Grains/ White flour or wheat flour White bread. White pasta. White rice. Refined cornbread. Bagels and croissants. Crackers that contain trans fat.  Vegetables  Creamed or fried vegetables. Vegetables in a . Regular canned vegetables. Regular canned tomato sauce and paste. Regular tomato and vegetable juices.  Fruits Dried fruits. Canned fruit in light or heavy syrup. Fruit juice.  Meat and Other Protein Products Meat in general - RED meat & White meat.  Fatty cuts of meat. Ribs, chicken wings, all processed meats as bacon, sausage, bologna, salami, fatback, hot dogs, bratwurst and packaged luncheon meats.  Dairy  Whole or 2% milk, cream, half-and-half, and cream cheese. Whole-fat or sweetened yogurt. Full-fat cheeses or blue cheese. Non-dairy creamers and whipped toppings. Processed cheese, cheese spreads, or cheese curds.  Condiments Onion and garlic salt, seasoned salt, table salt, and sea salt. Canned and packaged gravies. Worcestershire sauce. Tartar sauce. Barbecue sauce. Teriyaki sauce. Soy sauce, including reduced sodium. Steak sauce. Fish sauce. Oyster sauce. Cocktail sauce. Horseradish. Ketchup and mustard. Meat flavorings and tenderizers. Bouillon cubes. Hot sauce. Tabasco sauce. Marinades. Taco seasonings. Relishes.  Fats and Oils Butter, stick margarine, lard,  shortening and bacon fat. Coconut, palm kernel, or palm oils. Regular salad dressings.  Pickles and olives. Salted popcorn and pretzels.  The items listed above may not be a complete list of foods and beverages to avoid.

## 2023-10-23 LAB — COMPLETE METABOLIC PANEL WITH GFR
AG Ratio: 1.6 (calc) (ref 1.0–2.5)
ALT: 13 U/L (ref 6–29)
AST: 14 U/L (ref 10–35)
Albumin: 4.4 g/dL (ref 3.6–5.1)
Alkaline phosphatase (APISO): 63 U/L (ref 37–153)
BUN/Creatinine Ratio: 16 (calc) (ref 6–22)
BUN: 16 mg/dL (ref 7–25)
CO2: 28 mmol/L (ref 20–32)
Calcium: 10.4 mg/dL (ref 8.6–10.4)
Chloride: 105 mmol/L (ref 98–110)
Creat: 1.03 mg/dL — ABNORMAL HIGH (ref 0.60–1.00)
Globulin: 2.7 g/dL (ref 1.9–3.7)
Glucose, Bld: 91 mg/dL (ref 65–99)
Potassium: 4.9 mmol/L (ref 3.5–5.3)
Sodium: 141 mmol/L (ref 135–146)
Total Bilirubin: 0.4 mg/dL (ref 0.2–1.2)
Total Protein: 7.1 g/dL (ref 6.1–8.1)
eGFR: 55 mL/min/{1.73_m2} — ABNORMAL LOW (ref >=60)

## 2023-10-23 LAB — CBC WITH DIFFERENTIAL/PLATELET
Absolute Lymphocytes: 1866 {cells}/uL (ref 850–3900)
Absolute Monocytes: 446 {cells}/uL (ref 200–950)
Basophils Absolute: 46 {cells}/uL (ref 0–200)
Basophils Relative: 0.5 %
Eosinophils Absolute: 164 {cells}/uL (ref 15–500)
Eosinophils Relative: 1.8 %
HCT: 44.5 % (ref 35.0–45.0)
Hemoglobin: 14.4 g/dL (ref 11.7–15.5)
MCH: 30.6 pg (ref 27.0–33.0)
MCHC: 32.4 g/dL (ref 32.0–36.0)
MCV: 94.5 fL (ref 80.0–100.0)
MPV: 11 fL (ref 7.5–12.5)
Monocytes Relative: 4.9 %
Neutro Abs: 6579 {cells}/uL (ref 1500–7800)
Neutrophils Relative %: 72.3 %
Platelets: 286 10*3/uL (ref 140–400)
RBC: 4.71 10*6/uL (ref 3.80–5.10)
RDW: 12.2 % (ref 11.0–15.0)
Total Lymphocyte: 20.5 %
WBC: 9.1 10*3/uL (ref 3.8–10.8)

## 2023-10-23 LAB — HEMOGLOBIN A1C
Hgb A1c MFr Bld: 5.6 %{Hb} (ref <5.7)
Mean Plasma Glucose: 114 mg/dL
eAG (mmol/L): 6.3 mmol/L

## 2023-10-23 LAB — PARATHYROID HORMONE, INTACT (NO CA): PTH: 30 pg/mL (ref 16–77)

## 2023-10-23 LAB — URINALYSIS, ROUTINE W REFLEX MICROSCOPIC
Bilirubin Urine: NEGATIVE
Glucose, UA: NEGATIVE
Hgb urine dipstick: NEGATIVE
Ketones, ur: NEGATIVE
Leukocytes,Ua: NEGATIVE
Nitrite: NEGATIVE
Protein, ur: NEGATIVE
Specific Gravity, Urine: 1.008 (ref 1.001–1.035)
pH: 5.5 (ref 5.0–8.0)

## 2023-10-23 LAB — VITAMIN D 25 HYDROXY (VIT D DEFICIENCY, FRACTURES): Vit D, 25-Hydroxy: 70 ng/mL (ref 30–100)

## 2023-10-23 LAB — INSULIN, RANDOM: Insulin: 11.4 u[IU]/mL

## 2023-10-23 LAB — LIPID PANEL
Cholesterol: 145 mg/dL (ref <200)
HDL: 50 mg/dL (ref >=50)
LDL Cholesterol (Calc): 65 mg/dL
Non-HDL Cholesterol (Calc): 95 mg/dL (ref <130)
Total CHOL/HDL Ratio: 2.9 (calc) (ref <5.0)
Triglycerides: 238 mg/dL — ABNORMAL HIGH (ref <150)

## 2023-10-23 LAB — MICROALBUMIN / CREATININE URINE RATIO
Creatinine, Urine: 46 mg/dL (ref 20–275)
Microalb Creat Ratio: 4 mg/g{creat} (ref <30)
Microalb, Ur: 0.2 mg/dL

## 2023-10-23 LAB — TSH: TSH: 1.06 m[IU]/L (ref 0.40–4.50)

## 2023-10-23 LAB — MAGNESIUM: Magnesium: 2 mg/dL (ref 1.5–2.5)

## 2023-10-24 NOTE — Progress Notes (Signed)
<>*<>*<>*<>*<>*<>*<>*<>*<>*<>*<>*<>*<>*<>*<>*<>*<>*<>*<>*<>*<>*<>*<>*<>*<> <>*<>*<>*<>*<>*<>*<>*<>*<>*<>*<>*<>*<>*<>*<>*<>*<>*<>*<>*<>*<>*<>*<>*<>*<>  -  Test results slightly outside the reference range are not unusual. If there is anything important, I will review this with you,  otherwise it is considered normal test values.  If you have further questions,  please do not hesitate to contact me at the office or via My Chart.   <>*<>*<>*<>*<>*<>*<>*<>*<>*<>*<>*<>*<>*<>*<>*<>*<>*<>*<>*<>*<>*<>*<>*<>*<> <>*<>*<>*<>*<>*<>*<>*<>*<>*<>*<>*<>*<>*<>*<>*<>*<>*<>*<>*<>*<>*<>*<>*<>*<>  -  Chol = 145  -  Excellent   - Very low risk for Heart Attack  / Stroke  <>*<>*<>*<>*<>*<>*<>*<>*<>*<>*<>*<>*<>*<>*<>*<>*<>*<>*<>*<>*<>*<>*<>*<>*<> <>*<>*<>*<>*<>*<>*<>*<>*<>*<>*<>*<>*<>*<>*<>*<>*<>*<>*<>*<>*<>*<>*<>*<>*<>  -  But .. .  Triglycerides ( =  238 ) or fats in blood are too high                 (   Ideal or  Goal is less than 150  !  )    - Recommend avoid fried & greasy foods,  sweets / candy,   - Avoid white rice  (brown or wild rice or Quinoa is OK),   - Avoid white potatoes  (sweet potatoes are OK)   - Avoid anything made from white flour  - bagels, doughnuts, rolls, buns, biscuits, white and   wheat breads, pizza crust and traditional  pasta made of white flour & egg white  - (vegetarian pasta or spinach or wheat pasta is OK).    - Multi-grain bread is OK - like multi-grain flat bread or  sandwich thins.   - Avoid alcohol in excess.   - Exercise is also important.  <>*<>*<>*<>*<>*<>*<>*<>*<>*<>*<>*<>*<>*<>*<>*<>*<>*<>*<>*<>*<>*<>*<>*<>*<> <>*<>*<>*<>*<>*<>*<>*<>*<>*<>*<>*<>*<>*<>*<>*<>*<>*<>*<>*<>*<>*<>*<>*<>*<>  -  A1c - Normal - No Diabetes  - Great !   <>*<>*<>*<>*<>*<>*<>*<>*<>*<>*<>*<>*<>*<>*<>*<>*<>*<>*<>*<>*<>*<>*<>*<>*<> <>*<>*<>*<>*<>*<>*<>*<>*<>*<>*<>*<>*<>*<>*<>*<>*<>*<>*<>*<>*<>*<>*<>*<>*<>  -  Vitamin D = 70 - Excellent   - Vitamin D goal is between 70-100.    - It is very important as a natural anti-inflammatory and helping the                           immune system protect against viral infections, like the Covid-19   helping hair, skin, and nails, as well as reducing stroke and heart attack risk.   - It helps your bones and helps with mood.  - It also decreases numerous cancer risks   - Low Vit D is associated with a 200-300% higher risk for CANCER   and 200-300% higher risk for HEART   ATTACK  &  STROKE.    - It is also associated with higher death rate at younger ages,   autoimmune diseases like Rheumatoid arthritis, Lupus, Multiple Sclerosis.     - Also many other serious conditions, like depression, Alzheimer's  Dementia,  muscle aches, fatigue, fibromyalgia   <>*<>*<>*<>*<>*<>*<>*<>*<>*<>*<>*<>*<>*<>*<>*<>*<>*<>*<>*<>*<>*<>*<>*<>*<> <>*<>*<>*<>*<>*<>*<>*<>*<>*<>*<>*<>*<>*<>*<>*<>*<>*<>*<>*<>*<>*<>*<>*<>*<>  -  All Else - CBC - Kidneys - Electrolytes - Liver - Magnesium & Thyroid    - all  Normal / OK  <>*<>*<>*<>*<>*<>*<>*<>*<>*<>*<>*<>*<>*<>*<>*<>*<>*<>*<>*<>*<>*<>*<>*<>*<> <>*<>*<>*<>*<>*<>*<>*<>*<>*<>*<>*<>*<>*<>*<>*<>*<>*<>*<>*<>*<>*<>*<>*<>*<>

## 2023-10-26 ENCOUNTER — Emergency Department (HOSPITAL_BASED_OUTPATIENT_CLINIC_OR_DEPARTMENT_OTHER): Payer: Medicare Other | Admitting: Radiology

## 2023-10-26 ENCOUNTER — Other Ambulatory Visit: Payer: Self-pay

## 2023-10-26 ENCOUNTER — Other Ambulatory Visit: Payer: Self-pay | Admitting: Internal Medicine

## 2023-10-26 ENCOUNTER — Emergency Department (HOSPITAL_BASED_OUTPATIENT_CLINIC_OR_DEPARTMENT_OTHER): Payer: Medicare Other

## 2023-10-26 ENCOUNTER — Emergency Department (HOSPITAL_BASED_OUTPATIENT_CLINIC_OR_DEPARTMENT_OTHER)
Admission: EM | Admit: 2023-10-26 | Discharge: 2023-10-26 | Disposition: A | Discharge status: Home / Self Care | Payer: Medicare Other | Attending: Emergency Medicine | Admitting: Emergency Medicine

## 2023-10-26 ENCOUNTER — Encounter (HOSPITAL_BASED_OUTPATIENT_CLINIC_OR_DEPARTMENT_OTHER): Payer: Self-pay

## 2023-10-26 DIAGNOSIS — M85861 Other specified disorders of bone density and structure, right lower leg: Secondary | ICD-10-CM | POA: Diagnosis not present

## 2023-10-26 DIAGNOSIS — J40 Bronchitis, not specified as acute or chronic: Secondary | ICD-10-CM

## 2023-10-26 DIAGNOSIS — R270 Ataxia, unspecified: Secondary | ICD-10-CM | POA: Diagnosis not present

## 2023-10-26 DIAGNOSIS — S82021A Displaced longitudinal fracture of right patella, initial encounter for closed fracture: Secondary | ICD-10-CM | POA: Diagnosis not present

## 2023-10-26 DIAGNOSIS — W010XXA Fall on same level from slipping, tripping and stumbling without subsequent striking against object, initial encounter: Secondary | ICD-10-CM | POA: Diagnosis not present

## 2023-10-26 DIAGNOSIS — S80911A Unspecified superficial injury of right knee, initial encounter: Secondary | ICD-10-CM | POA: Diagnosis present

## 2023-10-26 DIAGNOSIS — Y9301 Activity, walking, marching and hiking: Secondary | ICD-10-CM | POA: Insufficient documentation

## 2023-10-26 DIAGNOSIS — M25461 Effusion, right knee: Secondary | ICD-10-CM | POA: Diagnosis not present

## 2023-10-26 DIAGNOSIS — S82001A Unspecified fracture of right patella, initial encounter for closed fracture: Secondary | ICD-10-CM | POA: Diagnosis not present

## 2023-10-26 DIAGNOSIS — S82041A Displaced comminuted fracture of right patella, initial encounter for closed fracture: Secondary | ICD-10-CM | POA: Diagnosis not present

## 2023-10-26 DIAGNOSIS — S0990XA Unspecified injury of head, initial encounter: Secondary | ICD-10-CM | POA: Diagnosis not present

## 2023-10-26 MED ORDER — OXYCODONE-ACETAMINOPHEN 5-325 MG PO TABS
1.0000 | ORAL_TABLET | Freq: Once | ORAL | Status: AC
  Administered 2023-10-26: 1 via ORAL
  Filled 2023-10-26: qty 1

## 2023-10-26 MED ORDER — ONDANSETRON HCL 4 MG PO TABS: 4.0000 mg | ORAL_TABLET | Freq: Four times a day (QID) | ORAL | 0 refills | Status: DC

## 2023-10-26 MED ORDER — OXYCODONE-ACETAMINOPHEN 5-325 MG PO TABS: 1.0000 | ORAL_TABLET | Freq: Four times a day (QID) | ORAL | 0 refills | Status: DC | PRN

## 2023-10-26 NOTE — Discharge Instructions (Addendum)
You have a fracture of your patella.  This is your kneecap.  You need to wear your knee immobilizer.  You can take Percocet as needed for severe pain.  You can take 650 mg of Tylenol every 6 hours, 40 mg of ibuprofen every 6 hours.  Including discharge paperwork is a telephone number for an orthopedic doctor.  Please make open appointment with them this week.

## 2023-10-26 NOTE — ED Triage Notes (Signed)
Pt advises mechanical fall just PTA, landed on R knee "& then face planted." Pt c/o R knee pain, unable to bear weight to same. Bruising noted below L eye, pt advises no pain to same

## 2023-10-26 NOTE — ED Notes (Signed)
Patient transported to CT 

## 2023-10-26 NOTE — ED Provider Notes (Signed)
[name] EMERGENCY DEPARTMENT AT [facility] Provider Note   CSN: [id number] Arrival date & time: 10/26/23  1331     History  Chief Complaint  Patient presents with   [name]    Alison Campbell is a 77 y.o. female.  This 77 year old female is here today after she had a nonsyncopal fall from standing.  Patient was walking, tripped, landed on her right knee and then struck her face.  She did not lose consciousness.  She is not on blood thinners.  Patient typically walks without any assistance.  She is here with her daughter at bedside.   Fall       Home Medications Prior to Admission medications   Medication Sig Start Date End Date Taking? Authorizing Provider  ondansetron (ZOFRAN) 4 MG tablet Take 1 tablet (4 mg total) by mouth every 6 (six) hours. 10/26/23  Yes Anders Simmonds T, DO  oxyCODONE-acetaminophen (PERCOCET/ROXICET) 5-325 MG tablet Take 1 tablet by mouth every 6 (six) hours as needed for severe pain (pain score 7-10). 10/26/23  Yes Anders Simmonds T, DO  Calcium Carb-Cholecalciferol (CALCIUM 600 + D PO) Take 2 tablets by mouth daily.    [provider]  cholecalciferol (VITAMIN D3) 25 MCG (1000 UNIT) tablet Take 5,000 Units by mouth daily.    [provider]  ezetimibe (ZETIA) 10 MG tablet TAKE 1 TABLET BY MOUTH DAILY FOR CHOLESTEROL 12/11/21   Lucky Cowboy, MD  Multiple Vitamin (MULTIVITAMIN PO) Take 1 tablet by mouth daily.    [provider]  Pirfenidone 267 MG TABS Take 2 tablets (534 mg total) by mouth with breakfast, with lunch, and with evening meal. 08/26/23   Kalman Shan, MD  Psyllium (METAMUCIL FIBER PO) Take 3 tablets by mouth daily.    [provider]  rosuvastatin (CRESTOR) 20 MG tablet Take 1/2 tablet (10 mg) 3 x / week for Cholesterol 05/11/23   Lucky Cowboy, MD  traZODone (DESYREL) 150 MG tablet Take 1/2 to 1 tablet 1 hour before Bedtime if needed for Sleep                                  /                                  TAKE                                               BY                            MOUTH 12/22/22   Lucky Cowboy, MD      Allergies    Fish oil and Spiriva respimat [tiotropium bromide monohydrate]    Review of Systems   Review of Systems  Physical Exam Updated Vital Signs BP (!) 157/61   Pulse 78   Temp 98.4 F (36.9 C)   Resp 14   Ht 5\' 3"  (1.6 m)   Wt 60.3 kg   SpO2 95%   BMI 23.56 kg/m  Physical Exam HENT:     Head: Normocephalic.     Comments: Left-sided infraorbital ecchymosis Eyes:     Extraocular Movements: Extraocular movements intact.  Conjunctiva/sclera: Conjunctivae normal.     Pupils: Pupils are equal, round, and reactive to light.  Cardiovascular:     Rate and Rhythm: Normal rate.  Musculoskeletal:     Cervical back: Normal range of motion. No rigidity or tenderness.     Comments: Swelling and tenderness of the right knee  Neurological:     General: No focal deficit present.     Mental Status: She is alert.     Sensory: No sensory deficit.     Motor: No weakness.     ED Results / Procedures / Treatments   Labs (all labs ordered are listed, but only abnormal results are displayed) Labs Reviewed - No data to display  EKG None  Radiology CT Maxillofacial Wo Contrast  Result Date: 10/26/2023 CLINICAL DATA:  Ataxia, head trauma EXAM: CT HEAD WITHOUT CONTRAST CT MAXILLOFACIAL WITHOUT CONTRAST TECHNIQUE: Multidetector CT imaging of the head and maxillofacial structures were performed using the standard protocol without intravenous contrast. Multiplanar CT image reconstructions of the maxillofacial structures were also generated. RADIATION DOSE REDUCTION: This exam was performed according to the departmental dose-optimization program which includes automated exposure control, adjustment of the mA and/or kV according to patient size and/or use of iterative reconstruction technique. COMPARISON:  None Available. FINDINGS: CT  HEAD FINDINGS Brain: No evidence of acute infarction, hemorrhage, hydrocephalus, extra-axial collection or mass lesion/mass effect. Moderate patchy white matter hypodensities are nonspecific but compatible with chronic microvascular ischemic disease. Vascular: No hyperdense vessel identified. Calcific atherosclerosis. Skull: No acute fracture. Other: No sizable mastoid effusions. CT MAXILLOFACIAL FINDINGS Osseous: No fracture. Left greater than right TMJ degenerative change. TMJs are located. Orbits: Negative. No traumatic or inflammatory finding. Sinuses: Clear. Soft tissues: Left infraorbital contusion. IMPRESSION: 1. No evidence of acute intracranial abnormality. 2. No facial fracture.  Left infraorbital/cheek contusion. Electronically Signed   By: Feliberto Harts M.D.   On: 10/26/2023 18:02   CT Head Wo Contrast  Result Date: 10/26/2023 CLINICAL DATA:  Ataxia, head trauma EXAM: CT HEAD WITHOUT CONTRAST CT MAXILLOFACIAL WITHOUT CONTRAST TECHNIQUE: Multidetector CT imaging of the head and maxillofacial structures were performed using the standard protocol without intravenous contrast. Multiplanar CT image reconstructions of the maxillofacial structures were also generated. RADIATION DOSE REDUCTION: This exam was performed according to the departmental dose-optimization program which includes automated exposure control, adjustment of the mA and/or kV according to patient size and/or use of iterative reconstruction technique. COMPARISON:  None Available. FINDINGS: CT HEAD FINDINGS Brain: No evidence of acute infarction, hemorrhage, hydrocephalus, extra-axial collection or mass lesion/mass effect. Moderate patchy white matter hypodensities are nonspecific but compatible with chronic microvascular ischemic disease. Vascular: No hyperdense vessel identified. Calcific atherosclerosis. Skull: No acute fracture. Other: No sizable mastoid effusions. CT MAXILLOFACIAL FINDINGS Osseous: No fracture. Left greater than  right TMJ degenerative change. TMJs are located. Orbits: Negative. No traumatic or inflammatory finding. Sinuses: Clear. Soft tissues: Left infraorbital contusion. IMPRESSION: 1. No evidence of acute intracranial abnormality. 2. No facial fracture.  Left infraorbital/cheek contusion. Electronically Signed   By: Feliberto Harts M.D.   On: 10/26/2023 18:02   DG Knee 2 Views Right  Result Date: 10/26/2023 CLINICAL DATA:  Pain after fall.  Pain and swelling. EXAM: RIGHT KNEE - 2 VIEW COMPARISON:  August 2012 FINDINGS: There is comminuted mildly displaced fracture of the mid waist of the patella with adjacent soft tissue swelling anterior and small joint effusion. No additional fracture or dislocation. Preserved joint spaces. Osteopenia. IMPRESSION: There is comminuted mildly displaced fracture of  the mid waist of the patella with adjacent soft tissue swelling anterior and small joint effusion. Electronically Signed   By: Karen Kays M.D.   On: 10/26/2023 17:30    Procedures Procedures    Medications Ordered in ED Medications  oxyCODONE-acetaminophen (PERCOCET/ROXICET) 5-325 MG per tablet 1 tablet (1 tablet Oral Given 10/26/23 1539)    ED Course/ Medical Decision Making/ A&P                                 Medical Decision Making 77 year old female here today with knee pain after fall.  Differential diagnoses include patella fracture, orbital fracture, less likely intracranial hemorrhage.  Plan-patient with patella fracture on plain films.  Have ordered analgesia and a knee immobilizer.  Patient has periorbital ecchymosis.  No evidence of entrapment on exam.  Will obtain CT imaging of the patient's head and max face.  Patient without any C-spine tenderness, no neurological deficits.  Daughter at bedside helps provide history.  Reassessment-my dependent review the patient's head CT shows no intracranial hemorrhage.  Discharged with knee immobilizer, orthopedic follow-up.  Amount and/or  Complexity of Data Reviewed Radiology: ordered.  Risk Prescription drug management.           Final Clinical Impression(s) / ED Diagnoses Final diagnoses:  Closed displaced longitudinal fracture of right patella, initial encounter    Rx / DC Orders ED Discharge Orders          Ordered    oxyCODONE-acetaminophen (PERCOCET/ROXICET) 5-325 MG tablet  Every 6 hours PRN        10/26/23 1812    ondansetron (ZOFRAN) 4 MG tablet  Every 6 hours        10/26/23 1812              Anders Simmonds T, DO 10/26/23 1814

## 2023-10-27 NOTE — H&P (Signed)
Patient's anticipated LOS is less than 2 midnights, meeting these requirements: - Younger than 56 - Lives within 1 hour of care - Has a competent adult at home to recover with post-op recover - NO history of  - Chronic pain requiring opiods  - Diabetes  - Coronary Artery Disease  - Heart failure  - Heart attack  - Stroke  - DVT/VTE  - Cardiac arrhythmia  - Respiratory Failure/COPD  - Renal failure  - Anemia  - Advanced Liver disease     Alison Campbell is an 77 y.o. female.    Chief Complaint: right knee pain  HPI: Pt is a 77 y.o. female complaining of right knee pain after recent fall. Pain had continually increased since the beginning. X-rays in the clinic show displaced right patella fracture. Various options are discussed with the patient. Risks, benefits and expectations were discussed with the patient. Patient understand the risks, benefits and expectations and wishes to proceed with surgery.   PCP:  Lucky Cowboy, MD  D/C Plans: Home  PMH: Past Medical History:  Diagnosis Date   Anxiety    CKD (chronic kidney disease) stage 3, GFR 30-59 ml/min (HCC) 12/24/2020   Colon polyps    Costochondritis    GERD (gastroesophageal reflux disease)    coffee related   Hyperlipidemia    Insomnia    Osteoporosis    Pre-diabetes     PSH: Past Surgical History:  Procedure Laterality Date   ABDOMINAL HYSTERECTOMY     COLONOSCOPY  09/10/2011   hysterectomy     INTERCOSTAL NERVE BLOCK Right 08/15/2022   Procedure: INTERCOSTAL NERVE BLOCK;  Surgeon: Loreli Slot, MD;  Location: [facility] OR;  Service: Thoracic;  Laterality: Right;   LUNG BIOPSY Right 08/15/2022   Procedure: LUNG BIOPSY;  Surgeon: Loreli Slot, MD;  Location: [facility] OR;  Service: Thoracic;  Laterality: Right;   POLYPECTOMY     UVULECTOMY N/A 09/09/2016   Procedure: PARTIAL UVULECTOMY, direct laryngoscopy;  Surgeon: [name], MD;  Location: [facility];  Service: ENT;   Laterality: N/A;  PARTIAL UVULECTOMY, direct laryngoscopy    Social History:  reports that she quit smoking about 14 months ago. Her smoking use included cigarettes. She started smoking about 58 years ago. She has a 57.6 pack-year smoking history. She has never used smokeless tobacco. She reports that she does not drink alcohol and does not use drugs. BMI: Estimated body mass index is 23.56 kg/m as calculated from the following:   Height as of 10/26/23: 5\' 3"  (1.6 m).   Weight as of 10/26/23: 60.3 kg.  Lab Results  Component Value Date   ALBUMIN 4.4 09/01/2023   Diabetes: Patient does not have a diagnosis of diabetes. Lab Results  Component Value Date   HGBA1C 5.6 10/22/2023     Smoking Status:      Allergies:  Allergies  Allergen Reactions   Fish Oil Diarrhea   Spiriva Respimat [Tiotropium Bromide Monohydrate] Cough    Gaging and excessive phlegm    Medications: No current facility-administered medications for this encounter.   Current Outpatient Medications  Medication Sig Dispense Refill   Calcium Carb-Cholecalciferol (CALCIUM 600 + D PO) Take 2 tablets by mouth daily.     cholecalciferol (VITAMIN D3) 25 MCG (1000 UNIT) tablet Take 5,000 Units by mouth daily.     ezetimibe (ZETIA) 10 MG tablet TAKE 1 TABLET BY MOUTH DAILY FOR CHOLESTEROL 90 tablet 3   Multiple Vitamin (MULTIVITAMIN PO) Take 1  tablet by mouth daily.     ondansetron (ZOFRAN) 4 MG tablet Take 1 tablet (4 mg total) by mouth every 6 (six) hours. 12 tablet 0   oxyCODONE-acetaminophen (PERCOCET/ROXICET) 5-325 MG tablet Take 1 tablet by mouth every 6 (six) hours as needed for severe pain (pain score 7-10). 12 tablet 0   Pirfenidone 267 MG TABS Take 2 tablets (534 mg total) by mouth with breakfast, with lunch, and with evening meal. 540 tablet 1   Psyllium (METAMUCIL FIBER PO) Take 3 tablets by mouth daily.     rosuvastatin (CRESTOR) 20 MG tablet Take 1/2 tablet (10 mg) 3 x / week for Cholesterol 39 tablet 3    traZODone (DESYREL) 150 MG tablet Take 1/2 to 1 tablet 1 hour before Bedtime if needed for Sleep                                  /                                 TAKE                                               BY                            MOUTH 90 tablet 3    No results found for this or any previous visit (from the past 48 hour(s)). CT Maxillofacial Wo Contrast  Result Date: 10/26/2023 CLINICAL DATA:  Ataxia, head trauma EXAM: CT HEAD WITHOUT CONTRAST CT MAXILLOFACIAL WITHOUT CONTRAST TECHNIQUE: Multidetector CT imaging of the head and maxillofacial structures were performed using the standard protocol without intravenous contrast. Multiplanar CT image reconstructions of the maxillofacial structures were also generated. RADIATION DOSE REDUCTION: This exam was performed according to the departmental dose-optimization program which includes automated exposure control, adjustment of the mA and/or kV according to patient size and/or use of iterative reconstruction technique. COMPARISON:  None Available. FINDINGS: CT HEAD FINDINGS Brain: No evidence of acute infarction, hemorrhage, hydrocephalus, extra-axial collection or mass lesion/mass effect. Moderate patchy white matter hypodensities are nonspecific but compatible with chronic microvascular ischemic disease. Vascular: No hyperdense vessel identified. Calcific atherosclerosis. Skull: No acute fracture. Other: No sizable mastoid effusions. CT MAXILLOFACIAL FINDINGS Osseous: No fracture. Left greater than right TMJ degenerative change. TMJs are located. Orbits: Negative. No traumatic or inflammatory finding. Sinuses: Clear. Soft tissues: Left infraorbital contusion. IMPRESSION: 1. No evidence of acute intracranial abnormality. 2. No facial fracture.  Left infraorbital/cheek contusion. Electronically Signed   By: Feliberto Harts M.D.   On: 10/26/2023 18:02   CT Head Wo Contrast  Result Date: 10/26/2023 CLINICAL DATA:  Ataxia, head trauma EXAM: CT  HEAD WITHOUT CONTRAST CT MAXILLOFACIAL WITHOUT CONTRAST TECHNIQUE: Multidetector CT imaging of the head and maxillofacial structures were performed using the standard protocol without intravenous contrast. Multiplanar CT image reconstructions of the maxillofacial structures were also generated. RADIATION DOSE REDUCTION: This exam was performed according to the departmental dose-optimization program which includes automated exposure control, adjustment of the mA and/or kV according to patient size and/or use of iterative reconstruction technique. COMPARISON:  None Available. FINDINGS: CT HEAD FINDINGS Brain: No evidence of acute  infarction, hemorrhage, hydrocephalus, extra-axial collection or mass lesion/mass effect. Moderate patchy white matter hypodensities are nonspecific but compatible with chronic microvascular ischemic disease. Vascular: No hyperdense vessel identified. Calcific atherosclerosis. Skull: No acute fracture. Other: No sizable mastoid effusions. CT MAXILLOFACIAL FINDINGS Osseous: No fracture. Left greater than right TMJ degenerative change. TMJs are located. Orbits: Negative. No traumatic or inflammatory finding. Sinuses: Clear. Soft tissues: Left infraorbital contusion. IMPRESSION: 1. No evidence of acute intracranial abnormality. 2. No facial fracture.  Left infraorbital/cheek contusion. Electronically Signed   By: Feliberto Harts M.D.   On: 10/26/2023 18:02   DG Knee 2 Views Right  Result Date: 10/26/2023 CLINICAL DATA:  Pain after fall.  Pain and swelling. EXAM: RIGHT KNEE - 2 VIEW COMPARISON:  August 2012 FINDINGS: There is comminuted mildly displaced fracture of the mid waist of the patella with adjacent soft tissue swelling anterior and small joint effusion. No additional fracture or dislocation. Preserved joint spaces. Osteopenia. IMPRESSION: There is comminuted mildly displaced fracture of the mid waist of the patella with adjacent soft tissue swelling anterior and small joint  effusion. Electronically Signed   By: Karen Kays M.D.   On: 10/26/2023 17:30    ROS: Pain with rom of the right lower extremity  Physical Exam: Alert and oriented 77 y.o. female in no acute distress Cranial nerves 2-12 intact Cervical spine: full rom with no tenderness, nv intact distally Chest: active breath sounds bilaterally, no wheeze rhonchi or rales Heart: regular rate and rhythm, no murmur Abd: non tender non distended with active bowel sounds Hip is stable with rom  Right knee abrasion with effusion Mild deformity of right patella Pt kept in strict extension Nv intact distally  Assessment/Plan Assessment: right displaced patella fracture  Plan:  Patient will undergo a right patella ORIF by Dr. Ranell Patrick at [place] Risks benefits and expectations were discussed with the patient. Patient understand risks, benefits and expectations and wishes to proceed. Preoperative templating of the joint replacement has been completed, documented, and submitted to the Operating Room personnel in order to optimize intra-operative equipment management.   Alphonsa Overall PA-C, MPAS [facility] is now [employer] [address], [place], [place] [address] Phone: [phone] [email or web address] Facebook  [employer]

## 2023-10-28 DIAGNOSIS — S82001A Unspecified fracture of right patella, initial encounter for closed fracture: Secondary | ICD-10-CM | POA: Diagnosis not present

## 2023-10-28 NOTE — Progress Notes (Signed)
COVID Vaccine Completed:yes  Date of COVID positive in last 90 days:  PCP - Lucky Cowboy, MD LOV 10/22/23 Cardiologist - Donato Schultz, MD LOV 01/03/21 for CP Pulmonologist- Kalman Shan, MD LOV 05/21/23  Chest x-ray - CT 07/21/23 Epic EKG - 10/22/23 Epic Stress Test - 03/17/18 Epic ECHO - n/a Cardiac Cath - n/a Pacemaker/ICD device last checked: n/a Spinal Cord Stimulator: n/a  Bowel Prep - no  Sleep Study - n/a CPAP -   Fasting Blood Sugar - pre Checks Blood Sugar _____ times a day  Last dose of GLP1 agonist-  N/A GLP1 instructions:  N/A   Last dose of SGLT-2 inhibitors-  N/A SGLT-2 instructions: N/A   Blood Thinner Instructions:  Time Aspirin Instructions: ASA 81 BID, per  Dr. Ranell Patrick Last Dose:  Activity level:  Can go up a flight of stairs and perform activities of daily living without stopping and without symptoms of chest pain or shortness of breath.  Able to exercise without symptoms  Unable to go up a flight of stairs without symptoms of     Anesthesia review: HTN, atherosclerosis, CAD, ILD, CKD  Patient denies shortness of breath, fever, cough and chest pain at PAT appointment  Patient verbalized understanding of instructions that were given to them at the PAT appointment. Patient was also instructed that they will need to review over the PAT instructions again at home before surgery.

## 2023-10-28 NOTE — Patient Instructions (Signed)
SURGICAL WAITING ROOM VISITATION  Patients having surgery or a procedure may have no more than 2 support people in the waiting area - these visitors may rotate.    Children under the age of 68 must have an adult with them who is not the patient.  Due to an increase in RSV and influenza rates and associated hospitalizations, children ages 36 and under may not visit patients in [facility] hospitals.  If the patient needs to stay at the hospital during part of their recovery, the visitor guidelines for inpatient rooms apply. Pre-op nurse will coordinate an appropriate time for 1 support person to accompany patient in pre-op.  This support person may not rotate.    Please refer to the [facility] website for the visitor guidelines for Inpatients (after your surgery is over and you are in a regular room).    Your procedure is scheduled on: 10/30/23   Report to [facility] Main Entrance    Report to admitting at 12:30 PM   Call this number if you have problems the morning of surgery [phone]   Do not eat food :After Midnight.   After Midnight you may have the following liquids until 11:45 AM DAY OF SURGERY  Water Non-Citrus Juices (without pulp, NO RED-Apple, White grape, White cranberry) Black Coffee (NO MILK/CREAM OR CREAMERS, sugar ok)  Clear Tea (NO MILK/CREAM OR CREAMERS, sugar ok) regular and decaf                             Plain Jell-O (NO RED)                                           Fruit ices (not with fruit pulp, NO RED)                                     Popsicles (NO RED)                                                               Sports drinks like Gatorade (NO RED)    The day of surgery:  Drink ONE (1) Pre-Surgery G2 at 11:45 AM the morning of surgery. Drink in one sitting. Do not sip.  This drink was given to you during your hospital  pre-op appointment visit. Nothing else to drink after completing the  Pre-Surgery G2.          If you have  questions, please contact your surgeon's office.   FOLLOW BOWEL PREP AND ANY ADDITIONAL PRE OP INSTRUCTIONS YOU RECEIVED FROM YOUR SURGEON'S OFFICE!!!     Oral Hygiene is also important to reduce your risk of infection.                                    Remember - BRUSH YOUR TEETH THE MORNING OF SURGERY WITH YOUR REGULAR TOOTHPASTE  DENTURES WILL BE REMOVED PRIOR TO SURGERY PLEASE DO NOT APPLY "Poly grip" OR ADHESIVES!!!  Do NOT smoke after Midnight   Stop all vitamins and herbal supplements 7 days before surgery.   Take these medicines the morning of surgery with A SIP OF WATER: Tylenol, Zetia, Rosuvastatin, Pirfenidone   DO NOT TAKE ANY ORAL DIABETIC MEDICATIONS DAY OF YOUR SURGERY  Bring CPAP mask and tubing day of surgery.                              You may not have any metal on your body including hair pins, jewelry, and body piercing             Do not wear make-up, lotions, powders, perfumes, or deodorant  Do not wear nail polish including gel and S&S, artificial/acrylic nails, or any other type of covering on natural nails including finger and toenails. If you have artificial nails, gel coating, etc. that needs to be removed by a nail salon please have this removed prior to surgery or surgery may need to be canceled/ delayed if the surgeon/ anesthesia feels like they are unable to be safely monitored.   Do not shave  48 hours prior to surgery.    Do not bring valuables to the hospital. [facility] IS NOT             RESPONSIBLE   FOR VALUABLES.   Contacts, glasses, dentures or bridgework may not be worn into surgery.   Bring small overnight bag day of surgery.   DO NOT BRING YOUR HOME MEDICATIONS TO THE HOSPITAL. PHARMACY WILL DISPENSE MEDICATIONS LISTED ON YOUR MEDICATION LIST TO YOU DURING YOUR ADMISSION IN THE HOSPITAL!   Special Instructions: Bring a copy of your healthcare power of attorney and living will documents the day of surgery if you haven't scanned  them before.              Please read over the following fact sheets you were given: IF YOU HAVE QUESTIONS ABOUT YOUR PRE-OP INSTRUCTIONS PLEASE CALL [phone] [place]ras   If you received a COVID test during your pre-op visit  it is requested that you wear a mask when out in public, stay away from anyone that may not be feeling well and notify your surgeon if you develop symptoms. If you test positive for Covid or have been in contact with anyone that has tested positive in the last 10 days please notify you surgeon.    [facility] - Preparing for Surgery Before surgery, you can play an important role.  Because skin is not sterile, your skin needs to be as free of germs as possible.  You can reduce the number of germs on your skin by washing with CHG (chlorahexidine gluconate) soap before surgery.  CHG is an antiseptic cleaner which kills germs and bonds with the skin to continue killing germs even after washing. Please DO NOT use if you have an allergy to CHG or antibacterial soaps.  If your skin becomes reddened/irritated stop using the CHG and inform your nurse when you arrive at Short Stay. Do not shave (including legs and underarms) for at least 48 hours prior to the first CHG shower.  You may shave your face/neck.  Please follow these instructions carefully:  1.  Shower with CHG Soap the night before surgery and the  morning of surgery.  2.  If you choose to wash your hair, wash your hair first as usual with your normal  shampoo.  3.  After you shampoo,  rinse your hair and body thoroughly to remove the shampoo.                             4.  Use CHG as you would any other liquid soap.  You can apply chg directly to the skin and wash.  Gently with a scrungie or clean washcloth.  5.  Apply the CHG Soap to your body ONLY FROM THE NECK DOWN.   Do   not use on face/ open                           Wound or open sores. Avoid contact with eyes, ears mouth and   genitals (private parts).                        Wash face,  Genitals (private parts) with your normal soap.             6.  Wash thoroughly, paying special attention to the area where your    surgery  will be performed.  7.  Thoroughly rinse your body with warm water from the neck down.  8.  DO NOT shower/wash with your normal soap after using and rinsing off the CHG Soap.                9.  Pat yourself dry with a clean towel.            10.  Wear clean pajamas.            11.  Place clean sheets on your bed the night of your first shower and do not  sleep with pets. Day of Surgery : Do not apply any lotions/deodorants the morning of surgery.  Please wear clean clothes to the hospital/surgery center.  FAILURE TO FOLLOW THESE INSTRUCTIONS MAY RESULT IN THE CANCELLATION OF YOUR SURGERY  PATIENT SIGNATURE_________________________________  NURSE SIGNATURE__________________________________  ________________________________________________________________________  Rogelia Mire  An incentive spirometer is a tool that can help keep your lungs clear and active. This tool measures how well you are filling your lungs with each breath. Taking long deep breaths may help reverse or decrease the chance of developing breathing (pulmonary) problems (especially infection) following: A long period of time when you are unable to move or be active. BEFORE THE PROCEDURE  If the spirometer includes an indicator to show your best effort, your nurse or respiratory therapist will set it to a desired goal. If possible, sit up straight or lean slightly forward. Try not to slouch. Hold the incentive spirometer in an upright position. INSTRUCTIONS FOR USE  Sit on the edge of your bed if possible, or sit up as far as you can in bed or on a chair. Hold the incentive spirometer in an upright position. Breathe out normally. Place the mouthpiece in your mouth and seal your lips tightly around it. Breathe in slowly and as deeply as possible,  raising the piston or the ball toward the top of the column. Hold your breath for 3-5 seconds or for as long as possible. Allow the piston or ball to fall to the bottom of the column. Remove the mouthpiece from your mouth and breathe out normally. Rest for a few seconds and repeat Steps 1 through 7 at least 10 times every 1-2 hours when you are awake. Take your time and take a few normal breaths between  deep breaths. The spirometer may include an indicator to show your best effort. Use the indicator as a goal to work toward during each repetition. After each set of 10 deep breaths, practice coughing to be sure your lungs are clear. If you have an incision (the cut made at the time of surgery), support your incision when coughing by placing a pillow or rolled up towels firmly against it. Once you are able to get out of bed, walk around indoors and cough well. You may stop using the incentive spirometer when instructed by your caregiver.  RISKS AND COMPLICATIONS Take your time so you do not get dizzy or light-headed. If you are in pain, you may need to take or ask for pain medication before doing incentive spirometry. It is harder to take a deep breath if you are having pain. AFTER USE Rest and breathe slowly and easily. It can be helpful to keep track of a log of your progress. Your caregiver can provide you with a simple table to help with this. If you are using the spirometer at home, follow these instructions: SEEK MEDICAL CARE IF:  You are having difficultly using the spirometer. You have trouble using the spirometer as often as instructed. Your pain medication is not giving enough relief while using the spirometer. You develop fever of 100.5 F (38.1 C) or higher. SEEK IMMEDIATE MEDICAL CARE IF:  You cough up bloody sputum that had not been present before. You develop fever of 102 F (38.9 C) or greater. You develop worsening pain at or near the incision site. MAKE SURE YOU:  Understand  these instructions. Will watch your condition. Will get help right away if you are not doing well or get worse. Document Released: 04/27/2007 Document Revised: 03/08/2012 Document Reviewed: 06/28/2007 [facility] Patient Information 2014 [place], [place].   ________________________________________________________________________

## 2023-10-29 ENCOUNTER — Encounter (HOSPITAL_COMMUNITY)
Admission: RE | Admit: 2023-10-29 | Discharge: 2023-10-29 | Disposition: A | Discharge status: Home / Self Care | Payer: Medicare Other | Source: Ambulatory Visit | Attending: Orthopedic Surgery | Admitting: Orthopedic Surgery

## 2023-10-29 ENCOUNTER — Encounter (HOSPITAL_COMMUNITY): Payer: Self-pay

## 2023-10-29 ENCOUNTER — Other Ambulatory Visit: Payer: Self-pay

## 2023-10-29 DIAGNOSIS — X58XXXA Exposure to other specified factors, initial encounter: Secondary | ICD-10-CM | POA: Diagnosis not present

## 2023-10-29 DIAGNOSIS — J449 Chronic obstructive pulmonary disease, unspecified: Secondary | ICD-10-CM | POA: Diagnosis not present

## 2023-10-29 DIAGNOSIS — S82001A Unspecified fracture of right patella, initial encounter for closed fracture: Secondary | ICD-10-CM | POA: Insufficient documentation

## 2023-10-29 DIAGNOSIS — Z01818 Encounter for other preprocedural examination: Secondary | ICD-10-CM | POA: Diagnosis present

## 2023-10-29 DIAGNOSIS — Z01812 Encounter for preprocedural laboratory examination: Secondary | ICD-10-CM | POA: Diagnosis not present

## 2023-10-29 DIAGNOSIS — N183 Chronic kidney disease, stage 3 unspecified: Secondary | ICD-10-CM | POA: Insufficient documentation

## 2023-10-29 HISTORY — DX: Elevated blood-pressure reading, without diagnosis of hypertension: R03.0

## 2023-10-29 HISTORY — DX: Chronic obstructive pulmonary disease, unspecified: J44.9

## 2023-10-29 HISTORY — DX: Unspecified atherosclerosis: I70.90

## 2023-10-29 NOTE — Progress Notes (Signed)
Anesthesia Chart Review   Case: [id number] Date/Time: 10/30/23 1215   Procedure: OPEN REDUCTION INTERNAL FIXATION (ORIF) PATELLA (Right: Knee) - 90   Anesthesia type: General   Pre-op diagnosis: Right patella fracture   Location: [facility] ROOM 06 / [facility]   Surgeons: Beverely Low, MD       DISCUSSION:77 y.o. smoker with h/o COPD, CKD Stage III, right patella fracture scheduled for above procedure 10/30/2023 with Dr. Beverely Low.   Pt last seen by pulmonology 05/21/2023. Per OV note pt with chronic dyspnea, stable at this visit.   Per previous anethesia note 08/15/2022, "Laryngoscope Size: Glidescope and 3 Tube type: Oral Endobronchial tube: Left, Double lumen EBT and EBT position confirmed by auscultation and 35 Fr Number of attempts: 2 Airway Equipment and Method: Stylet and Oral airway Placement Confirmation: ETT inserted through vocal cords under direct vision, positive ETCO2 and breath sounds checked- equal and bilateral Tube secured with: Tape Dental Injury: Teeth and Oropharynx as per pre-operative assessment  Difficulty Due To: Difficulty was anticipated and Difficult Airway- due to anterior larynx Comments: VLx1 with glidescope 3 - grade I view, attempted to pass 37Fr. DLT - unable to advance tracheal cuff past vocal cords. VLx2 with glidescope 3 - grade I view, able to advance 35Fr. DLT"  VS: Pulse 79   Temp 36.9 C (Oral)   Resp 16   Ht 5\' 3"  (1.6 m)   Wt 60.3 kg   SpO2 96%   BMI 23.55 kg/m   PROVIDERS: Lucky Cowboy, MD is PCP  Cardiologist - Donato Schultz, MD   Pulmonologist- Kalman Shan, MD  LABS: Labs reviewed: Acceptable for surgery. (all labs ordered are listed, but only abnormal results are displayed)  Labs Reviewed - No data to display   IMAGES:   EKG:   CV: Myocardial Perfusion 03/17/2018 Normal myocardial perfusion without ischemia or scar. The left ventricular ejection fraction is hyperdynamic (>65%). Poor exercise capacity. Past Medical  History:  Diagnosis Date   Anxiety    Atherosclerosis    CKD (chronic kidney disease) stage 3, GFR 30-59 ml/min (HCC) 12/24/2020   Colon polyps    COPD (chronic obstructive pulmonary disease) (HCC)    pulmonary fibrosis   Costochondritis    Elevated blood pressure reading    no meds   GERD (gastroesophageal reflux disease)    coffee related   Hyperlipidemia    Insomnia    Osteoporosis    Pre-diabetes     Past Surgical History:  Procedure Laterality Date   ABDOMINAL HYSTERECTOMY     COLONOSCOPY  09/10/2011   hysterectomy     INTERCOSTAL NERVE BLOCK Right 08/15/2022   Procedure: INTERCOSTAL NERVE BLOCK;  Surgeon: Loreli Slot, MD;  Location: [facility] OR;  Service: Thoracic;  Laterality: Right;   LUNG BIOPSY Right 08/15/2022   Procedure: LUNG BIOPSY;  Surgeon: Loreli Slot, MD;  Location: [facility] OR;  Service: Thoracic;  Laterality: Right;   POLYPECTOMY     UVULECTOMY N/A 09/09/2016   Procedure: PARTIAL UVULECTOMY, direct laryngoscopy;  Surgeon: [name], MD;  Location: [facility];  Service: ENT;  Laterality: N/A;  PARTIAL UVULECTOMY, direct laryngoscopy    MEDICATIONS:  acetaminophen (TYLENOL) 500 MG tablet   aspirin EC 81 MG tablet   Calcium Carb-Cholecalciferol (CALCIUM 600 + D PO)   cholecalciferol (VITAMIN D3) 25 MCG (1000 UNIT) tablet   ezetimibe (ZETIA) 10 MG tablet   Ibuprofen 200 MG CAPS   Multiple Vitamin (MULTIVITAMIN PO)   ondansetron (ZOFRAN)  4 MG tablet   oxyCODONE-acetaminophen (PERCOCET/ROXICET) 5-325 MG tablet   Pirfenidone 267 MG TABS   Psyllium (METAMUCIL FIBER PO)   rosuvastatin (CRESTOR) 20 MG tablet   traZODone (DESYREL) 150 MG tablet   No current facility-administered medications for this encounter.     Jodell Cipro Ward, PA-C [facility] Pre-Surgical Testing [phone]

## 2023-10-29 NOTE — Anesthesia Preprocedure Evaluation (Addendum)
Anesthesia Evaluation  Patient identified by MRN, date of birth, ID band Patient awake    Reviewed: Allergy & Precautions, NPO status , Patient's Chart, lab work & pertinent test results  History of Anesthesia Complications (+) DIFFICULT AIRWAY and history of anesthetic complications  Airway Mallampati: II  TM Distance: >3 FB Neck ROM: Full    Dental no notable dental hx. (+) Teeth Intact, Dental Advisory Given   Pulmonary COPD, Current Smoker and Patient abstained from smoking. Pulmonary FIbrosis   Pulmonary exam normal breath sounds clear to auscultation       Cardiovascular hypertension, + CAD  Normal cardiovascular exam Rhythm:Regular Rate:Normal  Myocardial Perfusion 03/17/2018  Normal myocardial perfusion without ischemia or scar.  The left ventricular ejection fraction is hyperdynamic (>65%).  Poor exercise capacity.    Neuro/Psych   Anxiety        GI/Hepatic   Endo/Other    Renal/GU Renal InsufficiencyRenal diseaseStage 3     Musculoskeletal negative musculoskeletal ROS (+)    Abdominal   Peds  Hematology Lab Results      Component                Value               Date                      WBC                      9.1                 10/22/2023                HGB                      14.4                10/22/2023                HCT                      44.5                10/22/2023                MCV                      94.5                10/22/2023                PLT                      286                 10/22/2023              Anesthesia Other Findings   Reproductive/Obstetrics                             Anesthesia Physical Anesthesia Plan  ASA: 3  Anesthesia Plan: General and Regional   Post-op Pain Management: Regional block*, Minimal or no pain anticipated and Precedex   Induction: Intravenous  PONV Risk Score and Plan: 3 and Treatment may vary due to  age or medical condition, Ondansetron, Propofol infusion and TIVA  Airway Management  Planned: LMA  Additional Equipment: None  Intra-op Plan:   Post-operative Plan:   Informed Consent: I have reviewed the patients History and Physical, chart, labs and discussed the procedure including the risks, benefits and alternatives for the proposed anesthesia with the patient or authorized representative who has indicated his/her understanding and acceptance.     Dental advisory given  Plan Discussed with: CRNA  Anesthesia Plan Comments: (See PAT note 10/29/2023, Jessica Ward, PA-C  TIVA LMA w R adductor canal block)       Anesthesia Quick Evaluation

## 2023-10-30 ENCOUNTER — Other Ambulatory Visit: Payer: Self-pay

## 2023-10-30 ENCOUNTER — Ambulatory Visit (HOSPITAL_COMMUNITY): Payer: Medicare Other | Admitting: Physician Assistant

## 2023-10-30 ENCOUNTER — Observation Stay (HOSPITAL_COMMUNITY)
Admission: RE | Admit: 2023-10-30 | Discharge: 2023-10-31 | Disposition: A | Discharge status: Home / Self Care | Payer: Medicare Other | Attending: Orthopedic Surgery | Admitting: Orthopedic Surgery

## 2023-10-30 ENCOUNTER — Ambulatory Visit (HOSPITAL_COMMUNITY): Payer: Medicare Other | Admitting: Anesthesiology

## 2023-10-30 ENCOUNTER — Encounter (HOSPITAL_COMMUNITY): Payer: Self-pay | Admitting: Orthopedic Surgery

## 2023-10-30 ENCOUNTER — Encounter (HOSPITAL_COMMUNITY)
Admission: RE | Disposition: A | Discharge status: Home / Self Care | Payer: Self-pay | Source: Home / Self Care | Attending: Orthopedic Surgery

## 2023-10-30 ENCOUNTER — Other Ambulatory Visit (HOSPITAL_COMMUNITY): Payer: Self-pay

## 2023-10-30 DIAGNOSIS — S82041A Displaced comminuted fracture of right patella, initial encounter for closed fracture: Principal | ICD-10-CM | POA: Insufficient documentation

## 2023-10-30 DIAGNOSIS — N183 Chronic kidney disease, stage 3 unspecified: Secondary | ICD-10-CM | POA: Diagnosis not present

## 2023-10-30 DIAGNOSIS — S82001A Unspecified fracture of right patella, initial encounter for closed fracture: Secondary | ICD-10-CM

## 2023-10-30 DIAGNOSIS — E782 Mixed hyperlipidemia: Secondary | ICD-10-CM | POA: Diagnosis not present

## 2023-10-30 DIAGNOSIS — Z87891 Personal history of nicotine dependence: Secondary | ICD-10-CM | POA: Insufficient documentation

## 2023-10-30 DIAGNOSIS — F17210 Nicotine dependence, cigarettes, uncomplicated: Secondary | ICD-10-CM | POA: Diagnosis not present

## 2023-10-30 DIAGNOSIS — I2510 Atherosclerotic heart disease of native coronary artery without angina pectoris: Secondary | ICD-10-CM | POA: Diagnosis not present

## 2023-10-30 DIAGNOSIS — Z79899 Other long term (current) drug therapy: Secondary | ICD-10-CM | POA: Diagnosis not present

## 2023-10-30 DIAGNOSIS — W19XXXA Unspecified fall, initial encounter: Secondary | ICD-10-CM | POA: Insufficient documentation

## 2023-10-30 DIAGNOSIS — J449 Chronic obstructive pulmonary disease, unspecified: Secondary | ICD-10-CM | POA: Insufficient documentation

## 2023-10-30 DIAGNOSIS — I129 Hypertensive chronic kidney disease with stage 1 through stage 4 chronic kidney disease, or unspecified chronic kidney disease: Secondary | ICD-10-CM | POA: Diagnosis not present

## 2023-10-30 HISTORY — PX: ORIF PATELLA: SHX5033

## 2023-10-30 SURGERY — OPEN REDUCTION INTERNAL FIXATION (ORIF) PATELLA
Anesthesia: Regional | Site: Knee | Laterality: Right

## 2023-10-30 MED ORDER — LACTATED RINGERS IV SOLN: INTRAVENOUS | Status: DC

## 2023-10-30 MED ORDER — ONDANSETRON HCL 4 MG PO TABS
4.0000 mg | ORAL_TABLET | Freq: Three times a day (TID) | ORAL | 1 refills | Status: DC | PRN
  Filled 2023-10-30 – 2023-10-31 (×2): qty 30, 10d supply, fill #0

## 2023-10-30 MED ORDER — PHENYLEPHRINE HCL-NACL 20-0.9 MG/250ML-% IV SOLN
INTRAVENOUS | Status: DC | PRN
  Administered 2023-10-30: 40 ug/min via INTRAVENOUS

## 2023-10-30 MED ORDER — METHOCARBAMOL 1000 MG/10ML IJ SOLN: 500.0000 mg | Freq: Four times a day (QID) | INTRAMUSCULAR | Status: DC | PRN

## 2023-10-30 MED ORDER — OYSTER SHELL CALCIUM/D3 500-5 MG-MCG PO TABS
1.0000 | ORAL_TABLET | Freq: Every day | ORAL | Status: DC
  Administered 2023-10-31: 1 via ORAL
  Filled 2023-10-30: qty 1

## 2023-10-30 MED ORDER — ROPIVACAINE HCL 5 MG/ML IJ SOLN
INTRAMUSCULAR | Status: DC | PRN
  Administered 2023-10-30: 30 mL via PERINEURAL

## 2023-10-30 MED ORDER — FENTANYL CITRATE (PF) 100 MCG/2ML IJ SOLN
INTRAMUSCULAR | Status: AC
  Filled 2023-10-30: qty 2

## 2023-10-30 MED ORDER — CHLORHEXIDINE GLUCONATE 0.12 % MT SOLN
15.0000 mL | Freq: Once | OROMUCOSAL | Status: AC
  Administered 2023-10-30: 15 mL via OROMUCOSAL

## 2023-10-30 MED ORDER — METOCLOPRAMIDE HCL 5 MG/ML IJ SOLN: 5.0000 mg | Freq: Three times a day (TID) | INTRAMUSCULAR | Status: DC | PRN

## 2023-10-30 MED ORDER — ONDANSETRON HCL 4 MG/2ML IJ SOLN
INTRAMUSCULAR | Status: DC | PRN
  Administered 2023-10-30: 4 mg via INTRAVENOUS

## 2023-10-30 MED ORDER — ACETAMINOPHEN 10 MG/ML IV SOLN
INTRAVENOUS | Status: AC
  Filled 2023-10-30: qty 100

## 2023-10-30 MED ORDER — FENTANYL CITRATE PF 50 MCG/ML IJ SOSY
PREFILLED_SYRINGE | INTRAMUSCULAR | Status: AC
  Administered 2023-10-30: 25 ug via INTRAVENOUS
  Filled 2023-10-30: qty 1

## 2023-10-30 MED ORDER — OXYCODONE HCL 5 MG/5ML PO SOLN: 5.0000 mg | Freq: Once | ORAL | Status: DC | PRN

## 2023-10-30 MED ORDER — EPHEDRINE 5 MG/ML INJ
INTRAVENOUS | Status: AC
  Filled 2023-10-30: qty 5

## 2023-10-30 MED ORDER — ONDANSETRON HCL 4 MG/2ML IJ SOLN: 4.0000 mg | Freq: Four times a day (QID) | INTRAMUSCULAR | Status: DC | PRN

## 2023-10-30 MED ORDER — PROPOFOL 500 MG/50ML IV EMUL
INTRAVENOUS | Status: DC | PRN
  Administered 2023-10-30: 100 ug/kg/min via INTRAVENOUS

## 2023-10-30 MED ORDER — ONDANSETRON HCL 4 MG PO TABS: 4.0000 mg | ORAL_TABLET | Freq: Four times a day (QID) | ORAL | Status: DC

## 2023-10-30 MED ORDER — MENTHOL 3 MG MT LOZG: 1.0000 | LOZENGE | OROMUCOSAL | Status: DC | PRN

## 2023-10-30 MED ORDER — IBUPROFEN 400 MG PO TABS
800.0000 mg | ORAL_TABLET | Freq: Two times a day (BID) | ORAL | Status: DC
  Administered 2023-10-30 – 2023-10-31 (×2): 800 mg via ORAL
  Filled 2023-10-30 (×2): qty 2

## 2023-10-30 MED ORDER — SODIUM CHLORIDE 0.9% FLUSH
10.0000 mL | Freq: Two times a day (BID) | INTRAVENOUS | Status: DC
Start: 2023-10-30 — End: 2023-10-31
  Administered 2023-10-31: 10 mL via INTRAVENOUS

## 2023-10-30 MED ORDER — PHENYLEPHRINE HCL (PRESSORS) 10 MG/ML IV SOLN
INTRAVENOUS | Status: DC | PRN
  Administered 2023-10-30: 80 ug via INTRAVENOUS

## 2023-10-30 MED ORDER — FENTANYL CITRATE PF 50 MCG/ML IJ SOSY
50.0000 ug | PREFILLED_SYRINGE | INTRAMUSCULAR | Status: AC
  Administered 2023-10-30: 50 ug via INTRAVENOUS
  Filled 2023-10-30: qty 2

## 2023-10-30 MED ORDER — OXYCODONE HCL 5 MG PO TABS
5.0000 mg | ORAL_TABLET | ORAL | Status: DC | PRN
  Administered 2023-10-30 – 2023-10-31 (×3): 5 mg via ORAL
  Filled 2023-10-30 (×3): qty 1

## 2023-10-30 MED ORDER — DOCUSATE SODIUM 100 MG PO CAPS
100.0000 mg | ORAL_CAPSULE | Freq: Two times a day (BID) | ORAL | Status: DC
  Administered 2023-10-30 – 2023-10-31 (×2): 100 mg via ORAL
  Filled 2023-10-30 (×2): qty 1

## 2023-10-30 MED ORDER — PROPOFOL 10 MG/ML IV BOLUS
INTRAVENOUS | Status: DC | PRN
  Administered 2023-10-30: 20 mg via INTRAVENOUS

## 2023-10-30 MED ORDER — ASPIRIN 81 MG PO TBEC
81.0000 mg | DELAYED_RELEASE_TABLET | Freq: Two times a day (BID) | ORAL | 0 refills | Status: AC
  Filled 2023-10-30 – 2023-10-31 (×2): qty 60, 30d supply, fill #0

## 2023-10-30 MED ORDER — ADULT MULTIVITAMIN W/MINERALS CH
1.0000 | ORAL_TABLET | Freq: Every day | ORAL | Status: DC
  Administered 2023-10-31: 1 via ORAL
  Filled 2023-10-30: qty 1

## 2023-10-30 MED ORDER — METOCLOPRAMIDE HCL 5 MG PO TABS: 5.0000 mg | ORAL_TABLET | Freq: Three times a day (TID) | ORAL | Status: DC | PRN

## 2023-10-30 MED ORDER — ACETAMINOPHEN 500 MG PO TABS: 1000.0000 mg | ORAL_TABLET | Freq: Three times a day (TID) | ORAL | Status: DC | PRN

## 2023-10-30 MED ORDER — TRANEXAMIC ACID-NACL 1000-0.7 MG/100ML-% IV SOLN
1000.0000 mg | INTRAVENOUS | Status: AC
  Administered 2023-10-30: 1000 mg via INTRAVENOUS
  Filled 2023-10-30: qty 100

## 2023-10-30 MED ORDER — METHOCARBAMOL 500 MG PO TABS
500.0000 mg | ORAL_TABLET | Freq: Four times a day (QID) | ORAL | Status: DC | PRN
  Filled 2023-10-30: qty 1

## 2023-10-30 MED ORDER — EZETIMIBE 10 MG PO TABS
10.0000 mg | ORAL_TABLET | Freq: Every day | ORAL | Status: DC
Start: 2023-10-31 — End: 2023-10-31
  Administered 2023-10-31: 10 mg via ORAL
  Filled 2023-10-30: qty 1

## 2023-10-30 MED ORDER — ACETAMINOPHEN 325 MG PO TABS: 325.0000 mg | ORAL_TABLET | Freq: Four times a day (QID) | ORAL | Status: DC | PRN

## 2023-10-30 MED ORDER — DEXAMETHASONE SODIUM PHOSPHATE 4 MG/ML IJ SOLN
INTRAMUSCULAR | Status: DC | PRN
  Administered 2023-10-30: 8 mg via INTRAVENOUS

## 2023-10-30 MED ORDER — ROSUVASTATIN CALCIUM 10 MG PO TABS: 10.0000 mg | ORAL_TABLET | ORAL | Status: DC

## 2023-10-30 MED ORDER — LIDOCAINE HCL (CARDIAC) PF 100 MG/5ML IV SOSY
PREFILLED_SYRINGE | INTRAVENOUS | Status: DC | PRN
  Administered 2023-10-30: 100 mg via INTRAVENOUS

## 2023-10-30 MED ORDER — ORAL CARE MOUTH RINSE: 15.0000 mL | Freq: Once | OROMUCOSAL | Status: AC

## 2023-10-30 MED ORDER — PHENOL 1.4 % MT LIQD: 1.0000 | OROMUCOSAL | Status: DC | PRN

## 2023-10-30 MED ORDER — OXYCODONE HCL 5 MG PO TABS: 5.0000 mg | ORAL_TABLET | Freq: Once | ORAL | Status: DC | PRN

## 2023-10-30 MED ORDER — TRANEXAMIC ACID-NACL 1000-0.7 MG/100ML-% IV SOLN
1000.0000 mg | Freq: Once | INTRAVENOUS | Status: AC
Start: 2023-10-30 — End: 2023-10-30
  Administered 2023-10-30: 1000 mg via INTRAVENOUS
  Filled 2023-10-30: qty 100

## 2023-10-30 MED ORDER — VITAMIN D 25 MCG (1000 UNIT) PO TABS: 5000.0000 [IU] | ORAL_TABLET | Freq: Every day | ORAL | Status: DC

## 2023-10-30 MED ORDER — FENTANYL CITRATE (PF) 100 MCG/2ML IJ SOLN
INTRAMUSCULAR | Status: DC | PRN
  Administered 2023-10-30 (×2): 25 ug via INTRAVENOUS
  Administered 2023-10-30: 50 ug via INTRAVENOUS

## 2023-10-30 MED ORDER — CLONIDINE HCL (ANALGESIA) 100 MCG/ML EP SOLN
EPIDURAL | Status: DC | PRN
  Administered 2023-10-30: 100 ug

## 2023-10-30 MED ORDER — POLYETHYLENE GLYCOL 3350 17 G PO PACK: 17.0000 g | PACK | Freq: Every day | ORAL | Status: DC | PRN

## 2023-10-30 MED ORDER — HYDROMORPHONE HCL 1 MG/ML IJ SOLN: 0.5000 mg | INTRAMUSCULAR | Status: DC | PRN

## 2023-10-30 MED ORDER — ONDANSETRON HCL 4 MG PO TABS: 4.0000 mg | ORAL_TABLET | Freq: Four times a day (QID) | ORAL | Status: DC | PRN

## 2023-10-30 MED ORDER — PIRFENIDONE 267 MG PO TABS: 534.0000 mg | ORAL_TABLET | Freq: Three times a day (TID) | ORAL | Status: DC

## 2023-10-30 MED ORDER — PROPOFOL 10 MG/ML IV BOLUS
INTRAVENOUS | Status: AC
  Filled 2023-10-30: qty 20

## 2023-10-30 MED ORDER — ONDANSETRON HCL 4 MG/2ML IJ SOLN: 4.0000 mg | Freq: Once | INTRAMUSCULAR | Status: DC | PRN

## 2023-10-30 MED ORDER — CEFAZOLIN SODIUM-DEXTROSE 2-4 GM/100ML-% IV SOLN
2.0000 g | INTRAVENOUS | Status: AC
  Administered 2023-10-30: 2 g via INTRAVENOUS
  Filled 2023-10-30: qty 100

## 2023-10-30 MED ORDER — FENTANYL CITRATE PF 50 MCG/ML IJ SOSY
25.0000 ug | PREFILLED_SYRINGE | INTRAMUSCULAR | Status: DC | PRN
  Administered 2023-10-30: 25 ug via INTRAVENOUS

## 2023-10-30 MED ORDER — EPHEDRINE SULFATE (PRESSORS) 50 MG/ML IJ SOLN
INTRAMUSCULAR | Status: DC | PRN
  Administered 2023-10-30 (×2): 5 mg via INTRAVENOUS

## 2023-10-30 MED ORDER — ASPIRIN 81 MG PO CHEW
81.0000 mg | CHEWABLE_TABLET | Freq: Two times a day (BID) | ORAL | Status: DC
  Administered 2023-10-30 – 2023-10-31 (×2): 81 mg via ORAL
  Filled 2023-10-30 (×2): qty 1

## 2023-10-30 MED ORDER — ACETAMINOPHEN 10 MG/ML IV SOLN
1000.0000 mg | Freq: Once | INTRAVENOUS | Status: DC | PRN
  Administered 2023-10-30: 1000 mg via INTRAVENOUS

## 2023-10-30 MED ORDER — DEXMEDETOMIDINE HCL IN NACL 80 MCG/20ML IV SOLN
INTRAVENOUS | Status: DC | PRN
  Administered 2023-10-30: 8 ug via INTRAVENOUS
  Administered 2023-10-30 (×2): 4 ug via INTRAVENOUS

## 2023-10-30 MED ORDER — BUPIVACAINE-EPINEPHRINE 0.25% -1:200000 IJ SOLN
INTRAMUSCULAR | Status: AC
  Filled 2023-10-30: qty 1

## 2023-10-30 MED ORDER — CEFAZOLIN SODIUM-DEXTROSE 2-4 GM/100ML-% IV SOLN
2.0000 g | Freq: Four times a day (QID) | INTRAVENOUS | Status: AC
  Administered 2023-10-30 – 2023-10-31 (×2): 2 g via INTRAVENOUS
  Filled 2023-10-30 (×2): qty 100

## 2023-10-30 SURGICAL SUPPLY — 45 items
BAG COUNTER SPONGE SURGICOUNT (BAG) IMPLANT
BAG SPEC THK2 15X12 ZIP CLS (MISCELLANEOUS) ×1
BAG SPNG CNTER NS LX DISP (BAG)
BAG ZIPLOCK 12X15 (MISCELLANEOUS) ×1 IMPLANT
BIT DRILL 2.6 CANN (BIT) IMPLANT
BNDG CMPR MED 10X6 ELC LF (GAUZE/BANDAGES/DRESSINGS) ×1
BNDG ELASTIC 6X10 VLCR STRL LF (GAUZE/BANDAGES/DRESSINGS) IMPLANT
BNDG GAUZE DERMACEA FLUFF 4 (GAUZE/BANDAGES/DRESSINGS) ×2 IMPLANT
BNDG GZE DERMACEA 4 6PLY (GAUZE/BANDAGES/DRESSINGS) ×1
COVER SURGICAL LIGHT HANDLE (MISCELLANEOUS) ×1 IMPLANT
CUFF TOURN SGL QUICK 34 (TOURNIQUET CUFF) ×1
CUFF TRNQT CYL 34X4.125X (TOURNIQUET CUFF) ×1 IMPLANT
DRAPE C-ARM 42X120 X-RAY (DRAPES) ×1 IMPLANT
DRAPE U-SHAPE 47X51 STRL (DRAPES) ×1 IMPLANT
DRSG ADAPTIC 3X8 NADH LF (GAUZE/BANDAGES/DRESSINGS) ×1 IMPLANT
DURAPREP 26ML APPLICATOR (WOUND CARE) ×1 IMPLANT
ELECT REM PT RETURN 15FT ADLT (MISCELLANEOUS) ×1 IMPLANT
FIBERTAPE 2 W/STRL NDL 17 (SUTURE) IMPLANT
GAUZE PAD ABD 8X10 STRL (GAUZE/BANDAGES/DRESSINGS) ×1 IMPLANT
GAUZE SPONGE 4X4 12PLY STRL (GAUZE/BANDAGES/DRESSINGS) IMPLANT
GLOVE BIOGEL PI IND STRL 7.5 (GLOVE) ×1 IMPLANT
GLOVE BIOGEL PI IND STRL 8.5 (GLOVE) ×1 IMPLANT
GLOVE ORTHO TXT STRL SZ7.5 (GLOVE) ×1 IMPLANT
GLOVE SURG ORTHO 8.5 STRL (GLOVE) ×1 IMPLANT
GUIDEWIRE 1.35MM DUAL TROCAR (WIRE) IMPLANT
IMMOBILIZER KNEE 20 (SOFTGOODS) ×1
IMMOBILIZER KNEE 20 THIGH 36 (SOFTGOODS) ×1 IMPLANT
KIT TURNOVER KIT A (KITS) IMPLANT
MANIFOLD NEPTUNE II (INSTRUMENTS) ×1 IMPLANT
PACK ORTHO EXTREMITY (CUSTOM PROCEDURE TRAY) ×1 IMPLANT
PADDING CAST SYNTHETIC 6X4 NS (CAST SUPPLIES) ×2 IMPLANT
PENCIL SMOKE EVACUATOR (MISCELLANEOUS) IMPLANT
PROTECTOR NERVE ULNAR (MISCELLANEOUS) ×1 IMPLANT
SCREW 4X36MM CANN LO-PRO (Screw) IMPLANT
SCREW LO-PRO BLUNT TIP 4X40MM (Screw) IMPLANT
STAPLER SKIN PROX WIDE 3.9 (STAPLE) ×1 IMPLANT
SUT FIBERWIRE #2 38 T-5 BLUE (SUTURE) ×5
SUT VIC AB 1 CT1 27 (SUTURE) ×4
SUT VIC AB 1 CT1 27XBRD ANTBC (SUTURE) ×4 IMPLANT
SUT VIC AB 2-0 CT1 27 (SUTURE) ×2
SUT VIC AB 2-0 CT1 TAPERPNT 27 (SUTURE) ×2 IMPLANT
SUTURE FIBERWR #2 38 T-5 BLUE (SUTURE) ×3 IMPLANT
TOWEL OR 17X26 10 PK STRL BLUE (TOWEL DISPOSABLE) ×2 IMPLANT
WASHER (Orthopedic Implant) ×2 IMPLANT
WASHER ORTHO 7X (Orthopedic Implant) IMPLANT

## 2023-10-30 NOTE — Transfer of Care (Signed)
Immediate Anesthesia Transfer of Care Note  Patient: Alison Campbell  Procedure(s) Performed: Procedure(s) with comments: OPEN REDUCTION INTERNAL FIXATION (ORIF) PATELLA (Right) - 90  Patient Location: PACU  Anesthesia Type:General  Level of Consciousness:  sedated, patient cooperative and responds to stimulation  Airway & Oxygen Therapy:Patient Spontanous Breathing and Patient connected to face mask oxgen  Post-op Assessment:  Report given to PACU RN and Post -op Vital signs reviewed and stable  Post vital signs:  Reviewed and stable  Last Vitals:  Vitals:   10/30/23 1304 10/30/23 1309  BP:    Pulse: 67 65  Resp: 12 12  Temp:    SpO2: 97% 97%    Complications: No apparent anesthesia complications

## 2023-10-30 NOTE — Op Note (Unsigned)
Alison Campbell, FROMER MEDICAL RECORD NO: [id number] ACCOUNT NO: [id number] DATE OF BIRTH: [birth year] FACILITY: [id number] PHYSICIAN: Almedia Balls. Ranell Patrick, MD  Operative Report   DATE OF PROCEDURE: 10/30/2023   PREOPERATIVE DIAGNOSIS:  Comminuted and displaced right patella fracture.  POSTOPERATIVE DIAGNOSIS:  Comminuted and displaced right patella fracture.  PROCEDURE PERFORMED:  ORIF right patella fracture using Arthrex cannulated screw with FiberTape tension band system.  ATTENDING SURGEON:  Almedia Balls. Ranell Patrick, MD  ASSISTANT:  Konrad Felix Dixon, [place], who was scrubbed during the entire procedure, and necessary for satisfactory completion of surgery.  ANESTHESIA:  General anesthesia.  ESTIMATED BLOOD LOSS:  Minimal.  FLUID REPLACEMENT:  1000 mL crystalloid.  COUNTS:  Instrument counts were correct.  COMPLICATIONS:  None.  ANTIBIOTICS:  Perioperative antibiotics were given.  INDICATIONS:  The patient is a 77 year old female who suffered a hard fall onto the right knee sustaining a comminuted and displaced patella fracture.  We discussed with the patient that we would need to repair that to reestablish her extensor mechanism  and allow for her to walk.  She agreed.  Informed consent obtained.    DESCRIPTION OF PROCEDURE:  After an adequate level of anesthesia was achieved, the patient was positioned supine on the operating room table.  The right leg was correctly identified.  A nonsterile tourniquet was placed on the right proximal thigh.  We  did not utilize the tourniquet during the case.    Sterile prep and drape were performed.  Timeout was called.  Again, verifying correct patient correct site.  We utilized a longitudinal midline incision with #10 blade scalpel dissection down through the subcutaneous tissues using Bovie and also the  scalpel.  We were able to identify the fractured patella.  This was highly comminuted but amenable to repair.  We went ahead and  developed small retinacular extensions medial and lateral to the patella and then aligned the patella both under  visualization and with C-arm and also I was able to digitally palpate under the patella to make sure that we had the cartilage aligned and then placed two guidewires from distal to proximal across the fracture site at the appropriate position.  There was  extensive comminution of the bone on the medial patella and we really could not place the screws exactly where we wanted them because of the comminution.  So our first screw was pretty much in the midline and then we had one that was about 1 cm lateral  to that, but we did have two good screws with bicortical purchase and then we passed the FiberTape using the [place] needle and did a figure-of-eight tension band technique over the top of the patella and then tied that under tension.  Once we had that  tied, we maintained our tenaculum clamp fixation and then we did our retinacular repair.  We also did a little bit of supplemental suture repair with some of the loose bone fragments basically in a pants or vest type technique and those were mattress  sutures, so we did side-to-side on the retinacular repair medially and laterally.  I reinforced the patella and I was able to flex the knee to about 45 degrees with no motion at the fracture site.  Final x-rays were obtained.  We were happy with her  hardware placement.  At this point, we irrigated thoroughly and closed with layered closure with 2-0 Vicryl and staples.  Sterile bandage and knee immobilizer were applied.  The patient was transported to  the recovery room.      SUJ D: 10/30/2023 4:03:35 pm T: 10/30/2023 11:13:00 pm  JOB: [id number]/ [id number]

## 2023-10-30 NOTE — Brief Op Note (Signed)
10/30/2023  3:54 PM  PATIENT:  Alison Campbell  77 y.o. female  PRE-OPERATIVE DIAGNOSIS:  Right patella fracture, comminuted and displaced  POST-OPERATIVE DIAGNOSIS:  Right patella fracture, comminuted and displaced  PROCEDURE:  Procedure(s) with comments: OPEN REDUCTION INTERNAL FIXATION (ORIF) PATELLA (Right) - 90 Arthrex cannulated screws with fibertape tension band construct  SURGEON:  Surgeons and Role:    Beverely Low, MD - Primary  PHYSICIAN ASSISTANT:   ASSISTANTS: Thea Gist, PA-C   ANESTHESIA:   general  EBL:  50 mL   BLOOD ADMINISTERED:none  DRAINS: none   LOCAL MEDICATIONS USED:  NONE  SPECIMEN:  No Specimen  DISPOSITION OF SPECIMEN:  N/A  COUNTS:  correct  TOURNIQUET:  * No tourniquets in log *  DICTATION: .Other Dictation: Dictation Number [id number]  PLAN OF CARE: Admit for overnight observation  PATIENT DISPOSITION:  PACU - hemodynamically stable.   Delay start of Pharmacological VTE agent (>24hrs) due to surgical blood loss or risk of bleeding: no

## 2023-10-30 NOTE — Anesthesia Procedure Notes (Signed)
Anesthesia Regional Block: Adductor canal block   Pre-Anesthetic Checklist: , timeout performed,  Correct Patient, Correct Site, Correct Laterality,  Correct Procedure, Correct Position, site marked,  Risks and benefits discussed,  Surgical consent,  Pre-op evaluation,  At surgeon's request and post-op pain management  Laterality: Lower and Right  Prep: chloraprep       Needles:  Injection technique: Single-shot  Needle Type: Echogenic Needle     Needle Length: 9cm  Needle Gauge: 22     Additional Needles:   Procedures:,,,, ultrasound used (permanent image in chart),,    Narrative:  Start time: 10/30/2023 12:15 PM End time: 10/30/2023 12:23 PM Injection made incrementally with aspirations every 5 mL.  Performed by: Personally  Anesthesiologist: Trevor Iha, MD  Additional Notes: Block assessed prior to surgery. Pt tolerated procedure well.

## 2023-10-30 NOTE — Interval H&P Note (Signed)
History and Physical Interval Note:  10/30/2023 11:57 AM  Alison Campbell  has presented today for surgery, with the diagnosis of Right patella fracture.  The various methods of treatment have been discussed with the patient and family. After consideration of risks, benefits and other options for treatment, the patient has consented to  Procedure(s) with comments: OPEN REDUCTION INTERNAL FIXATION (ORIF) PATELLA (Right) - 90 as a surgical intervention.  The patient's history has been reviewed, patient examined, no change in status, stable for surgery.  I have reviewed the patient's chart and labs.  Questions were answered to the patient's satisfaction.     Verlee Rossetti

## 2023-10-30 NOTE — Anesthesia Procedure Notes (Signed)
Procedure Name: LMA Insertion Date/Time: 10/30/2023 2:14 PM  Performed by: Ludwig Lean, CRNAPre-anesthesia Checklist: Patient identified, Emergency Drugs available, Suction available and Patient being monitored Patient Re-evaluated:Patient Re-evaluated prior to induction Oxygen Delivery Method: Circle system utilized Preoxygenation: Pre-oxygenation with 100% oxygen Induction Type: IV induction Ventilation: Mask ventilation without difficulty LMA: LMA inserted LMA Size: 4.0 Number of attempts: 1 Placement Confirmation: positive ETCO2 and breath sounds checked- equal and bilateral Tube secured with: Tape Dental Injury: Teeth and Oropharynx as per pre-operative assessment

## 2023-10-30 NOTE — Discharge Instructions (Signed)
Ice to the knee constantly.  Keep the incision covered and clean and dry for one week, then ok to get it wet in the shower. You may remove the bandage on Monday next week and apply the Aquacel bandage to the incision area. Leave that one for the remainder of next week then remove and leave open to air.   Do ankle exercise as instructed every hour, to improve circulation.   Keep a pillow under the calf to keep pressure off the heel and avoid a pressure sore  PLEASE DO NOT SMOKE! It increases infection risk and decreases healing ability.   Use the walker while you are up and around for balance.  Wear your support stockings 24/7 to prevent blood clots and take baby aspirin twice daily for 30 days also to prevent blood clots  Follow up with Dr Ranell Patrick in two weeks in the office, call [phone] for appt  Call Dr Ranell Patrick (cell) [phone] with any questions or concerns

## 2023-10-31 ENCOUNTER — Other Ambulatory Visit (HOSPITAL_COMMUNITY): Payer: Self-pay

## 2023-10-31 DIAGNOSIS — S82041A Displaced comminuted fracture of right patella, initial encounter for closed fracture: Secondary | ICD-10-CM | POA: Diagnosis not present

## 2023-10-31 DIAGNOSIS — Z87891 Personal history of nicotine dependence: Secondary | ICD-10-CM | POA: Diagnosis not present

## 2023-10-31 DIAGNOSIS — J449 Chronic obstructive pulmonary disease, unspecified: Secondary | ICD-10-CM | POA: Diagnosis not present

## 2023-10-31 DIAGNOSIS — N183 Chronic kidney disease, stage 3 unspecified: Secondary | ICD-10-CM | POA: Diagnosis not present

## 2023-10-31 DIAGNOSIS — Z79899 Other long term (current) drug therapy: Secondary | ICD-10-CM | POA: Diagnosis not present

## 2023-10-31 NOTE — Evaluation (Signed)
Physical Therapy Evaluation Patient Details Name: Alison Campbell MRN: [id number] DOB: [birth year] Today's Date: 10/31/2023  History of Present Illness  77 yo female admittedwith R patella fracture s/p ORIF 10/30/23. Hx of falls  Clinical Impression  On eval, pt was Supv level for mobility. She was able to ambulate with a RW and ascend/descend stairs with use of railing. She mobilizes well but has poor safety awareness. She was impulsive during session. Reviewed "no knee ROM" "wear KI at all times" precautions throughout session. No family present during session. Pt reports she will have assistance from her daughter (who is a Charity fundraiser) and her son (who is a long distance trunk driver). Will defer f/u PT needs to orthopedic team.Recommend 24/7 supervision assist for safety. All PT education completed.         If plan is discharge home, recommend the following: A little help with walking and/or transfers;A little help with bathing/dressing/bathroom;Assistance with cooking/housework;Assist for transportation;Help with stairs or ramp for entrance   Can travel by private vehicle        Equipment Recommendations None recommended by PT (pt reports her daughter Banker) already has a RW, cane for her to use)  Recommendations for Other Services       Functional Status Assessment Patient has had a recent decline in their functional status and demonstrates the ability to make significant improvements in function in a reasonable and predictable amount of time.     Precautions / Restrictions Precautions Precautions: Fall Precaution Comments: No knee ROM Required Braces or Orthoses: Knee Immobilizer - Right Knee Immobilizer - Right: On at all times Restrictions Weight Bearing Restrictions: No RLE Weight Bearing: Weight bearing as tolerated      Mobility  Bed Mobility Overal bed mobility: Needs Assistance Bed Mobility: Supine to Sit     Supine to sit: Supervision, HOB elevated     General bed  mobility comments: Supv for safety. Pt is impulsive.    Transfers Overall transfer level: Needs assistance Equipment used: Rolling walker (2 wheels) Transfers: Sit to/from Stand Sit to Stand: Supervision           General transfer comment: Cues for safety, technique, hand/LEplacement, adherence to precautions. Again, impulsive and unsafe at times (standing before instucted to,initiating mobility without RW)    Ambulation/Gait Ambulation/Gait assistance: Supervision Gait Distance (Feet): 125 Feet Assistive device: Rolling walker (2 wheels) Gait Pattern/deviations: Step-through pattern       General Gait Details: Cues for safety, technique, sequencing, pacing. Pt able to use reciprocal gait pattern as distance increased. No LOB with RW use.  Stairs            Wheelchair Mobility     Tilt Bed    Modified Rankin (Stroke Patients Only)       Balance Overall balance assessment: Needs assistance         Standing balance support: Bilateral upper extremity supported, Reliant on assistive device for balance, During functional activity Standing balance-Leahy Scale: Fair                               Pertinent Vitals/Pain Pain Assessment Pain Assessment: 0-10 Pain Score: 3  Pain Location: R knee Pain Intervention(s): Monitored during session, Ice applied (instructed pt not to mobilize with ice packs on), Repositioned    Home Living Family/patient expects to be discharged to:: Private residence Living Arrangements: Children (long distance trunk driver) Available Help at Discharge: Family;Available PRN/intermittently Type of  Home: House Home Access: Stairs to enter Entrance Stairs-Rails: Right;Left;Can reach both Entrance Stairs-Number of Steps: 4   Home Layout: One level Home Equipment: Agricultural consultant (2 wheels);Cane - single point      Prior Function Prior Level of Function : Independent/Modified Independent             Mobility  Comments: ind with mobility       Extremity/Trunk Assessment   Upper Extremity Assessment Upper Extremity Assessment: Overall WFL for tasks assessed    Lower Extremity Assessment Lower Extremity Assessment: Generalized weakness    Cervical / Trunk Assessment Cervical / Trunk Assessment: Normal  Communication   Communication Communication: No apparent difficulties Cueing Techniques: Verbal cues  Cognition Arousal: Alert Behavior During Therapy: Impulsive Overall Cognitive Status: No family/caregiver present to determine baseline cognitive functioning Area of Impairment: Memory, Problem solving, Safety/judgement                     Memory: Decreased short-term memory, Decreased recall of precautions   Safety/Judgement: Decreased awareness of safety   Problem Solving: Requires verbal cues General Comments: Repeated cueing required during session for safety, precautions.        General Comments      Exercises     Assessment/Plan    PT Assessment Patient needs continued PT services  PT Problem List Decreased strength;Decreased range of motion;Decreased activity tolerance;Decreased balance;Decreased mobility;Decreased knowledge of use of DME;Pain       PT Treatment Interventions DME instruction;Gait training;Stair training;Functional mobility training;Therapeutic activities;Therapeutic exercise;Patient/family education;Balance training    PT Goals (Current goals can be found in the Care Plan section)  Acute Rehab PT Goals Patient Stated Goal: home today (eager) PT Goal Formulation: With patient Time For Goal Achievement: 11/14/23 Potential to Achieve Goals: Good    Frequency Min 1X/week     Co-evaluation               AM-PAC PT "6 Clicks" Mobility  Outcome Measure Help needed turning from your back to your side while in a flat bed without using bedrails?: A Little Help needed moving from lying on your back to sitting on the side of a flat bed  without using bedrails?: A Little Help needed moving to and from a bed to a chair (including a wheelchair)?: A Little Help needed standing up from a chair using your arms (e.g., wheelchair or bedside chair)?: A Little Help needed to walk in hospital room?: A Little Help needed climbing 3-5 steps with a railing? : A Little 6 Click Score: 18    End of Session Equipment Utilized During Treatment: Gait belt;Right knee immobilizer Activity Tolerance: Patient tolerated treatment well Patient left: in chair;with call bell/phone within reach;with chair alarm set   PT Visit Diagnosis: History of falling (Z91.81);Other abnormalities of gait and mobility (R26.89);Pain Pain - Right/Left: Right Pain - part of body: Knee    Time: 1022-1045 PT Time Calculation (min) (ACUTE ONLY): 23 min   Charges:   PT Evaluation $PT Eval Low Complexity: 1 Low PT Treatments $Gait Training: 8-22 mins PT General Charges $$ ACUTE PT VISIT: 1 Visit           Faye Ramsay, PT Acute Rehabilitation  Office: [phone]

## 2023-10-31 NOTE — Discharge Summary (Signed)
PATIENT ID:      Alison Campbell  MRN:     [id number] DOB/AGE:    [birth year] / 77 y.o.     DISCHARGE SUMMARY  ADMISSION DATE:    10/30/2023 DISCHARGE DATE:  11/2/20024  ADMISSION DIAGNOSIS: Right patella fracture Past Medical History:  Diagnosis Date   Anxiety    Atherosclerosis    CKD (chronic kidney disease) stage 3, GFR 30-59 ml/min (HCC) 12/24/2020   Colon polyps    COPD (chronic obstructive pulmonary disease) (HCC)    pulmonary fibrosis   Costochondritis    Elevated blood pressure reading    no meds   GERD (gastroesophageal reflux disease)    coffee related   Hyperlipidemia    Insomnia    Osteoporosis    Pre-diabetes     DISCHARGE DIAGNOSIS:   Principal Problem:   Right patella fracture   PROCEDURE: Procedure(s): OPEN REDUCTION INTERNAL FIXATION (ORIF) PATELLA on 10/30/2023  CONSULTS:    HISTORY:  See H&P in chart.  HOSPITAL COURSE:  Alison Campbell is a 77 y.o. admitted on 10/30/2023 with a diagnosis of Right patella fracture.  They were brought to the operating room on 10/30/2023 and underwent Procedure(s): OPEN REDUCTION INTERNAL FIXATION (ORIF) PATELLA.    They were given perioperative antibiotics:  Anti-infectives (From admission, onward)    Start     Dose/Rate Route Frequency Ordered Stop   10/30/23 2000  ceFAZolin (ANCEF) IVPB 2g/100 mL premix        2 g 200 mL/hr over 30 Minutes Intravenous Every 6 hours 10/30/23 1721 10/31/23 0304   10/30/23 1030  ceFAZolin (ANCEF) IVPB 2g/100 mL premix        2 g 200 mL/hr over 30 Minutes Intravenous On call to O.R. 10/30/23 1016 10/30/23 1415     .  Patient underwent the above named procedure and tolerated it well. The following day they were hemodynamically stable and pain was controlled on oral analgesics. They were neurovascularly intact to the operative extremity. OT/PT was ordered and worked with patient per protocol. They were medically and orthopaedically stable for discharge on .    DIAGNOSTIC  STUDIES:  RECENT RADIOGRAPHIC STUDIES :  CT Maxillofacial Wo Contrast  Result Date: 10/26/2023 CLINICAL DATA:  Ataxia, head trauma EXAM: CT HEAD WITHOUT CONTRAST CT MAXILLOFACIAL WITHOUT CONTRAST TECHNIQUE: Multidetector CT imaging of the head and maxillofacial structures were performed using the standard protocol without intravenous contrast. Multiplanar CT image reconstructions of the maxillofacial structures were also generated. RADIATION DOSE REDUCTION: This exam was performed according to the departmental dose-optimization program which includes automated exposure control, adjustment of the mA and/or kV according to patient size and/or use of iterative reconstruction technique. COMPARISON:  None Available. FINDINGS: CT HEAD FINDINGS Brain: No evidence of acute infarction, hemorrhage, hydrocephalus, extra-axial collection or mass lesion/mass effect. Moderate patchy white matter hypodensities are nonspecific but compatible with chronic microvascular ischemic disease. Vascular: No hyperdense vessel identified. Calcific atherosclerosis. Skull: No acute fracture. Other: No sizable mastoid effusions. CT MAXILLOFACIAL FINDINGS Osseous: No fracture. Left greater than right TMJ degenerative change. TMJs are located. Orbits: Negative. No traumatic or inflammatory finding. Sinuses: Clear. Soft tissues: Left infraorbital contusion. IMPRESSION: 1. No evidence of acute intracranial abnormality. 2. No facial fracture.  Left infraorbital/cheek contusion. Electronically Signed   By: Feliberto Harts M.D.   On: 10/26/2023 18:02   CT Head Wo Contrast  Result Date: 10/26/2023 CLINICAL DATA:  Ataxia, head trauma EXAM: CT HEAD WITHOUT CONTRAST CT MAXILLOFACIAL WITHOUT CONTRAST TECHNIQUE: Multidetector  CT imaging of the head and maxillofacial structures were performed using the standard protocol without intravenous contrast. Multiplanar CT image reconstructions of the maxillofacial structures were also generated. RADIATION  DOSE REDUCTION: This exam was performed according to the departmental dose-optimization program which includes automated exposure control, adjustment of the mA and/or kV according to patient size and/or use of iterative reconstruction technique. COMPARISON:  None Available. FINDINGS: CT HEAD FINDINGS Brain: No evidence of acute infarction, hemorrhage, hydrocephalus, extra-axial collection or mass lesion/mass effect. Moderate patchy white matter hypodensities are nonspecific but compatible with chronic microvascular ischemic disease. Vascular: No hyperdense vessel identified. Calcific atherosclerosis. Skull: No acute fracture. Other: No sizable mastoid effusions. CT MAXILLOFACIAL FINDINGS Osseous: No fracture. Left greater than right TMJ degenerative change. TMJs are located. Orbits: Negative. No traumatic or inflammatory finding. Sinuses: Clear. Soft tissues: Left infraorbital contusion. IMPRESSION: 1. No evidence of acute intracranial abnormality. 2. No facial fracture.  Left infraorbital/cheek contusion. Electronically Signed   By: Feliberto Harts M.D.   On: 10/26/2023 18:02   DG Knee 2 Views Right  Result Date: 10/26/2023 CLINICAL DATA:  Pain after fall.  Pain and swelling. EXAM: RIGHT KNEE - 2 VIEW COMPARISON:  August 2012 FINDINGS: There is comminuted mildly displaced fracture of the mid waist of the patella with adjacent soft tissue swelling anterior and small joint effusion. No additional fracture or dislocation. Preserved joint spaces. Osteopenia. IMPRESSION: There is comminuted mildly displaced fracture of the mid waist of the patella with adjacent soft tissue swelling anterior and small joint effusion. Electronically Signed   By: Karen Kays M.D.   On: 10/26/2023 17:30    RECENT VITAL SIGNS:  Patient Vitals for the past 24 hrs:  BP Temp Temp src Pulse Resp SpO2 Height Weight  10/31/23 0922 (!) 159/69 98.5 F (36.9 C) Oral 74 18 97 % -- --  10/31/23 0600 (!) 146/63 98 F (36.7 C) -- 67 16 97  % -- --  10/31/23 0130 (!) 143/64 98.4 F (36.9 C) Oral 82 16 96 % -- --  10/30/23 2145 (!) 142/75 98.5 F (36.9 C) Oral 87 18 95 % -- --  10/30/23 1936 128/60 98.8 F (37.1 C) -- 86 17 96 % -- --  10/30/23 1930 -- 98.1 F (36.7 C) Axillary -- -- -- -- --  10/30/23 1725 (!) 144/70 97.6 F (36.4 C) -- 76 18 97 % -- --  10/30/23 1700 129/65 97.6 F (36.4 C) -- 70 13 94 % -- --  10/30/23 1600 (!) 133/57 -- -- 73 19 93 % -- --  10/30/23 1545 131/74 97.8 F (36.6 C) -- 78 19 100 % -- --  10/30/23 1309 -- -- -- 65 12 97 % -- --  10/30/23 1304 -- -- -- 67 12 97 % -- --  10/30/23 1259 121/63 -- -- 66 14 97 % -- --  10/30/23 1254 -- -- -- 67 15 97 % -- --  10/30/23 1249 -- -- -- 66 12 98 % -- --  10/30/23 1244 (!) 143/125 -- -- 70 12 97 % -- --  10/30/23 1239 -- -- -- 68 12 95 % -- --  10/30/23 1234 -- -- -- 63 12 96 % -- --  10/30/23 1229 (!) 153/58 -- -- 72 18 96 % -- --  10/30/23 1224 (!) 151/71 -- -- 72 17 97 % -- --  10/30/23 1214 -- -- -- -- 17 -- -- --  10/30/23 1024 (!) 161/61 (!) 97.5 F (36.4 C) Oral 73  16 96 % 5\' 3"  (1.6 m) 60.3 kg  .  RECENT EKG RESULTS:    Orders placed or performed in visit on 10/22/23   EKG 12-Lead    DISCHARGE INSTRUCTIONS:    DISCHARGE MEDICATIONS:   Allergies as of 10/31/2023       Reactions   Fish Oil Diarrhea   Spiriva Respimat [tiotropium Bromide Monohydrate] Cough   Gaging and excessive phlegm        Medication List     TAKE these medications    acetaminophen 500 MG tablet Commonly known as: TYLENOL Take 1,000 mg by mouth every 8 (eight) hours as needed for mild pain (pain score 1-3) or moderate pain (pain score 4-6).   aspirin EC 81 MG tablet Take 1 tablet (81 mg total) by mouth in the morning and at bedtime. Swallow whole. What changed: when to take this   CALCIUM 600 + D PO Take 2 tablets by mouth daily.   cholecalciferol 25 MCG (1000 UNIT) tablet Commonly known as: VITAMIN D3 Take 5,000 Units by mouth daily.    ezetimibe 10 MG tablet Commonly known as: ZETIA TAKE 1 TABLET BY MOUTH DAILY FOR CHOLESTEROL   Ibuprofen 200 MG Caps Take 800 mg by mouth 2 (two) times daily.   METAMUCIL FIBER PO Take 3 tablets by mouth daily.   MULTIVITAMIN PO Take 1 tablet by mouth daily.   ondansetron 4 MG tablet Commonly known as: ZOFRAN Take 1 tablet (4 mg total) by mouth every 6 (six) hours. What changed: Another medication with the same name was added. Make sure you understand how and when to take each.   ondansetron 4 MG tablet Commonly known as: Zofran Take 1 tablet (4 mg total) by mouth every 8 (eight) hours as needed for vomiting, refractory nausea / vomiting or nausea. What changed: You were already taking a medication with the same name, and this prescription was added. Make sure you understand how and when to take each.   oxyCODONE-acetaminophen 5-325 MG tablet Commonly known as: PERCOCET/ROXICET Take 1 tablet by mouth every 6 (six) hours as needed for severe pain (pain score 7-10).   Pirfenidone 267 MG Tabs Take 2 tablets (534 mg total) by mouth with breakfast, with lunch, and with evening meal.   rosuvastatin 20 MG tablet Commonly known as: CRESTOR Take 1/2 tablet (10 mg) 3 x / week for Cholesterol   traZODone 150 MG tablet Commonly known as: DESYREL Take 1/2 to 1 tablet 1 hour before Bedtime if needed for Sleep                                  /                                 TAKE                                               BY                            MOUTH        FOLLOW UP VISIT:    Follow-up Information     Beverely Low, MD. Call in 2 week(s).  Specialty: Orthopedic Surgery Why: please call [phone] for appt in two weeks with Dr Phineas Inches information: [address] [place] [address] [phone]                 DISCHARGE TO: Home    DISCHARGE CONDITION:  Eustaquio Maize Torin Modica PA-C 10/31/2023, 9:56 AM

## 2023-10-31 NOTE — Plan of Care (Signed)
Patient discharging home via private vehicle with daughter. Haydee Salter, RN 10/31/23 11:57 AM

## 2023-10-31 NOTE — Plan of Care (Signed)
Problem: Education: Goal: Knowledge of General Education information will improve Description: Including pain rating scale, medication(s)/side effects and non-pharmacologic comfort measures Outcome: Progressing   Problem: Clinical Measurements: Goal: Ability to maintain clinical measurements within normal limits will improve Outcome: Progressing   Problem: Activity: Goal: Risk for activity intolerance will decrease Outcome: Progressing   Problem: Pain Management: Goal: General experience of comfort will improve Outcome: Progressing   Haydee Salter, RN 10/31/23 11:30 AM

## 2023-10-31 NOTE — Care Management Important Message (Signed)
Important Message  Patient Details  Name: Alison Campbell MRN: [id number] Date of Birth: May 18, 1946   Important Message Given:  Yes - Medicare IM     Georgie Chard, LCSW 10/31/2023, 11:26 AM

## 2023-11-01 NOTE — Anesthesia Postprocedure Evaluation (Signed)
Anesthesia Post Note  Patient: Alison Campbell  Procedure(s) Performed: OPEN REDUCTION INTERNAL FIXATION (ORIF) PATELLA (Right: Knee)     Anesthesia Type: Regional and General Anesthetic complications: no   No notable events documented.  Last Vitals:  Vitals:   10/31/23 0600 10/31/23 0922  BP: (!) 146/63 (!) 159/69  Pulse: 67 74  Resp: 16 18  Temp: 36.7 C 36.9 C  SpO2: 97% 97%    Last Pain:  Vitals:   10/31/23 0945  TempSrc:   PainSc: 0-No pain                 Trevor Iha

## 2023-11-02 ENCOUNTER — Encounter (HOSPITAL_COMMUNITY): Payer: Self-pay | Admitting: Orthopedic Surgery

## 2023-11-09 NOTE — Progress Notes (Unsigned)
Synopsis: Referred in November 2022 for lung nodule by Lucky Cowboy, MD  Subjective:   PATIENT ID: Alison Campbell GENDER: female DOB: 07-Aug-1946, MRN: [id number]  Chief Complaint  Patient presents with   Follow-up    Follow up. Patient says she's been coughing up a lot of phlegm.     This is a 77 year old female, past medical history of gastroesophageal reflux, CKD, hyperlipidemia.Patient had a lung cancer screening CT on 11/02/2021.  Lung cancer screening CT revealed multiple small pulmonary nodules.  There was a new pleural-based nodule in the right upper lobe.  With a mean dry volume of 5.3 mm in diameter.  No other significant suspicious lesions.  Also has associated centrilobular and paraseptal emphysema.  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  Patient was referred to pulmonary for evaluation.  OV 11/13/2021: Here today to discuss abnormal CT imaging.  We also had a long discussion regarding smoking cessation.  Please see separate documentation regarding smoking cessation.  Patient has smoked since age 26.  She quit for 9 years however has had approximately 40+ pack years smoking at around half to 1 full pack per day.  OV 05/29/2022: Here today for follow-up recent CT scan.CT scan of the chest was completed on 05/09/2022 this was in follow-up of a right upper lobe pulmonary nodule.  Findings felt to be consistent with infection or inflammatory lesion on CT.  There is unchanged mild pulmonary fibrosis with apical to basal gradient irregular peripheral interstitial opacities groundglass and varicoid bronchiectasis without any evidence of honeycombing.  Findings on the CT were suggestive of an alternate diagnosis besides UIP.  From a respiratory standpoint she is doing okay.  She is not very active so she is not seeing much difference in her shortness of breath related to exertion.   OV 07/03/2022  Subjective:  Patient  ID: Alison Campbell, female , DOB: 04-20-1946 , age 54 y.o. , MRN: [id number] , ADDRESS: [address] [place] [address] PCP Lucky Cowboy, MD Patient Care Team: Lucky Cowboy, MD as PCP - General (Internal Medicine) Rachael Fee, MD as Attending Physician (Gastroenterology) Mardella Layman, MD as Consulting Physician (Gastroenterology) [name], MD (Inactive) as Consulting Physician (Otolaryngology) Cherlyn Roberts, MD as Consulting Physician (Dermatology) Josephine Igo, DO as Consulting Physician (Pulmonary Disease)  This Provider for this visit: Treatment Team:  Attending Provider: Kalman Shan, MD    07/03/2022 -transfer of care from Dr. Elige Radon Icard to the pulm fibrosis center Dr. Marchelle Gearing Chief Complaint  Patient presents with   Follow-up    Pt states she has been doing okay since last visit and denies any complaints.     HPI Alison Campbell 77 y.o. -pleasant female has been getting annual low-dose CT scans since 2018 annually.  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  Therefore she has been referred here.  According to the patient she is quite asymptomatic.  She barely has any symptoms and she feels fine.  She did have some cough in November 2022 and this is actually cleared up.  Current symptom severity is listed below   [facility] Comprehensive ILD Questionnaire  Symptoms:  Very mild    Past Medical History :  -She believes she might have some COPD.  Denies any asthma connective tissue disease.  Denies any stroke seizures hepatitis pulmonary hypertension.  Denies kidney disease.  Denies heart disease denies pleurisy. -She had COVID-vaccine but not the COVID   ROS:  8 pound intentional weight loss but otherwise negative  FAMILY HISTORY of LUNG DISEASE:  -She has an interesting family history.  Her  maternal grandmother was the cousin of any Marga Hoots  the [employer] ([email or web address]).  Her father was himself born in [birth year].  She was born in [birth year].  There is no family history of any lung disease.  PERSONAL EXPOSURE HISTORY:  -She started smoking in 1966.  She still smokes cigarettes.  In between she quit for 9 years.  No cigar smoking.  No marijuana smoking no cocaine no intravenous drug use  HOME  EXPOSURE and HOBBY DETAILS :  Single-family home for the last 29 years.  Age of the home is 50 years.  She does some gardening but otherwise detailed organic antigen exposure history in the house is negative  OCCUPATIONAL HISTORY (122 questions) : She is done some foot care work but otherwise detailed organic and inorganic antigen exposure history is negative  PULMONARY TOXICITY HISTORY (27 items):  She has been on prednisone in the past  INVESTIGATIONS:  Personally visualized all the CT scans and showed it to her" agree with current thinking of alternative to UIP diagnosis.  annual LDCT 2018, 2019, 2020 and 2021 July and 2022 November . ILD first repored in July 2021   and this the LDCT reported ILD as "progressive" though prior scan no mention of ILD. In my professional opinion: some ILD was there in 2019:. HRCT then done Nov 2022 - reported as prob UI and the May 2023 HRCT reported as altermnative dx. AGree with alternative diagnsos    CT Chest data - HRCT May 2023  Narrative & Impression  CLINICAL DATA:  Follow-up right upper lobe pulmonary nodule   EXAM: CT CHEST WITHOUT CONTRAST   TECHNIQUE: Multidetector CT imaging of the chest was performed following the standard protocol without intravenous contrast. High resolution imaging of the lungs, as well as inspiratory and expiratory imaging, was performed.   RADIATION DOSE REDUCTION: This exam was performed according to the departmental dose-optimization program which includes automated exposure control,  adjustment of the mA and/or kV according to patient size and/or use of iterative reconstruction technique.   COMPARISON:  11/02/2021   FINDINGS: Cardiovascular: Aortic atherosclerosis. Normal heart size. Left and right coronary artery calcifications. No pericardial effusion.   Mediastinum/Nodes: No enlarged mediastinal, hilar, or axillary lymph nodes. Thyroid gland, trachea, and esophagus demonstrate no significant findings.   Lungs/Pleura: Minimal paraseptal emphysema. Subpleural nodule of the anterior right upper lobe new on prior examination is diminished in fullness and conspicuity, with an irregular remnant in this vicinity (series 13, image 109). Additional small pulmonary nodules are unchanged. Unchanged mild pulmonary fibrosis in a pattern with apical to basal gradient featuring irregular peripheral interstitial opacity, ground-glass, and varicoid bronchiectasis without evidence of subpleural bronchiolectasis or honeycombing. No significant air trapping on expiratory phase imaging. No pleural effusion or pneumothorax.   Upper Abdomen: No acute abnormality.   Musculoskeletal: No chest wall abnormality. No suspicious osseous lesions identified.   IMPRESSION: 1. Subpleural nodule of the anterior right upper lobe new on prior examination is diminished in fullness and conspicuity, with an irregular remnant in this vicinity. Findings are consistent nonspecific sequelae of infection or inflammation, for which no further specific follow-up is required. Additional small pulmonary nodules are unchanged. Lung RADS category 2, benign appearance  or behavior. Consider ongoing low-dose CT lung cancer screening if indicated by patient age, smoking history, and/or other risk factors for lung cancer. 2. Unchanged mild pulmonary fibrosis in a pattern with apical to basal gradient featuring irregular peripheral interstitial opacity, ground-glass, and varicoid bronchiectasis without  evidence of subpleural bronchiolectasis or honeycombing. Findings are suggestive of an alternative diagnosis (not UIP) per consensus guidelines: Diagnosis of Idiopathic Pulmonary Fibrosis: An Official ATS/ERS/JRS/ALAT Clinical Practice Guideline. Am Rosezetta Schlatter Crit Care Med Vol 198, Iss 5, [id number], Aug 29 2017. 3. Minimal emphysema. 4. Coronary artery disease.   Aortic Atherosclerosis (ICD10-I70.0) and Emphysema (ICD10-J43.9).     Electronically Signed   By: Jearld Lesch M.D.   On: 05/12/2022 15:42        PFT  OV 10/01/2022  Subjective:  Patient ID: Alison Campbell, female , DOB: 09-08-1946 , age 8 y.o. , MRN: [id number] , ADDRESS: [address] [place] [address] PCP Lucky Cowboy, MD Patient Care Team: Lucky Cowboy, MD as PCP - General (Internal Medicine) Rachael Fee, MD as Attending Physician (Gastroenterology) Mardella Layman, MD as Consulting Physician (Gastroenterology) [name], MD (Inactive) as Consulting Physician (Otolaryngology) Cherlyn Roberts, MD as Consulting Physician (Dermatology) Josephine Igo, DO as Consulting Physician (Pulmonary Disease)  This Provider for this visit: Treatment Team:  Attending Provider: Kalman Shan, MD    10/01/2022 -   Chief Complaint  Patient presents with   Follow-up    Pt recently had a procedure performed by Dr. Dorris Fetch.  Pt states she has been doing okay since last visit. States has some soreness from the procedure that was done.     HPI Alison Campbell 77 y.o. -presents with her daughter Mar Daring who is a Optician, dispensing.  Bonita Quin used to work for [employer] and also [employer].  Patient CT scan had alternative diagnosis to UIP.  Therefore she underwent surgical lung biopsy on 08/15/2022.  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  We discussed several aspects of IPF mainly that is progressive disease with significant variability.   Overall she is stable.  Other than the soreness from the surgery she is fine.  We discussed 3 options of nintedanib versus pirfenidone versus participating in clinical trial with a modified form of pirfenidone.  The summary of this is detailed below.  She absolutely does not want area side effect.  Therefore she not interested in nintedanib.  She is known to have diverticulosis and is concerned this might provoke diverticulitis therefore even more caution against nintedanib.  We discussed pirfenidone with key elements below.  We discussed the [facility] trial along with the concept of clinical trials as a care option.  She is taken a copy of the consent to reflect on what would be the appropriate course for her which would either be take standard of care pirfenidone or participate in the clinical trial.   She quit smoking 09/15/22         PFT  SLB [id number]  SURGICAL PATHOLOGY  CASE: [id number]  PATIENT: Samiksha Lolli  Surgical Pathology Report      Clinical History: Interstitial lung disease (nt)      FINAL MICROSCOPIC DIAGNOSIS:   A. 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 LUNG, RIGHT UPPER LOBE, RESECTION:  - Minimal to mild interstitial fibrosis with elastosis (see  comment)   C. 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 There is evidence of architectural distortion with  peribronchiolar and smooth muscle metaplasia. Multiple fibroblastic foci  are seen along with focal microscopic honeycombing. Granulomas or  increased  eosinophils are not seen. Overall, the distribution and nature  of the interstitial fibrosis in this case are most suggestive of usual  interstitial pneumonia (UIP) pattern. UIP pattern may be seen with  collagen vascular diseases, drug reactions, or as an idiopathic disease  (idiopathic pulmonary fibrosis). Scattered lymphoid aggregates within  the affected areas may favor a collagen vascular disease. Clinical and  radiographic correlation is necessary.    OV 01/19/2023  Subjective:  Patient ID: Alison Campbell, female , DOB: 11-Oct-1946 , age 63 y.o. , MRN: [id number] , ADDRESS: [address] [place] [address] PCP Lucky Cowboy, MD Patient Care Team: Lucky Cowboy, MD as PCP - General (Internal Medicine) Rachael Fee, MD as Attending Physician (Gastroenterology) Mardella Layman, MD as Consulting Physician (Gastroenterology) [name], MD (Inactive) as Consulting Physician (Otolaryngology) Cherlyn Roberts, MD as Consulting Physician (Dermatology) Josephine Igo, DO as Consulting Physician (Pulmonary Disease)  This Provider for this visit: Treatment Team:  Attending Provider: Kalman Shan, MD   01/19/2023 -   Chief Complaint  Patient presents with   Follow-up    Follow up for IPF. Pt is still continuing the pirfenidone three times a day with no issues noted at this time.      HPI Roxy Waight 77 y.o. -returns for follow-up.  Last seen in the fall 2023.  She continues to do well.  She says symptoms are stable.  Not much shortness of breath.  She is not on oxygen.  She is tolerating pirfenidone really well except for slight diminished appetite but no weight loss.  No nausea no vomiting no diarrhea no GI upset no fatigue no skin rash.  She is relapsed with her smoking.  She is smoking only few cigarettes though.  She says she is trying to cut down.  Last pulmonary function test and high-resolution CT chest was in May 2023.  Her last  set of labs was in October 2023.  I reviewed this.  She needs another set of labs.  Of note she still has some incisional pain from lung biopsy.  It is very mild and tolerable she does not want medicines.  She has upcoming appointment Dr. Dorris Fetch.  We discussed participation in DEXA bone scan research protocol.  She is interested in this.  Pulmonary fibrosis patients might be at high risk for osteoporosis.  She has not had a bone scan in some years.  Given a copy of the consent form.  I also emailed the daughter the consent form.  The daughter is not here with her today.  Her daughter Bonita Quin is now working at [employer] as a Engineer, civil (consulting).  She is pretty excited about that.      Modified Six Minute Walk - 01/19/23 1000     Type of O2 used  Room Air    Number of laps completed  3    Lap Pace Moderate    Resting Heartrate 80 bpm    Final  Heartrate 92 bpm    Resting Pulse Ox 100 %    Desaturated to <= 3 points No    Desaturated to < 88% No    Became tachycardic No    Symptoms  no symptoms noted while walking 3 laps    Was the O2 correction test done? No    comments lab1: 98%/92, lap2:100%/94, lap3:100%/95 and after: 100%/92           OV 05/21/2023  Subjective:  Patient ID: Alison Campbell, female , DOB: [birth year] , age 15 y.o. , MRN: [id number] , ADDRESS: [address] [place] [address] PCP Lucky Cowboy, MD Patient Care Team: Lucky Cowboy, MD as PCP - General (Internal Medicine) Rachael Fee, MD as Attending Physician (Gastroenterology) Mardella Layman, MD as Consulting Physician (Gastroenterology) [name], MD (Inactive) as Consulting Physician (Otolaryngology) Cherlyn Roberts, MD as Consulting Physician (Dermatology) Josephine Igo, DO as Consulting Physician (Pulmonary Disease)  This Provider for this visit: Treatment Team:  Attending Provider: Kalman Shan, MD    05/21/2023 -   Chief Complaint  Patient presents with    Follow-up    F/up on PFT and CT scan     HPI Alison Campbell 77 y.o. -returns for follow-up.  She continues to feel stable from a dyspnea standpoint.  Her liver function test in May 2024 was normal.  Her symptom score below shows stability.  She had pulmonary function test and high-resolution CT scan of the chest and these are stable.  In terms of pirfenidone: She feels she is tolerating it well.  She gets a little fatigue after taking it but then it is transient and resolves.  But just as I began to examine her she told me that she has got rash on her bilateral forearms.  She said it happened 4 weeks ago while she was doing some bug spray in the outdoors.  She was sun exposed in her forearms.  There is no itching or burning.  It is punctate rash.  She thinks that that it is a bug bite.  But it has not resolved.  I did explain to her that pirfenidone can cause sun exposed rash.  She does not apply sunscreen because she rarely goes outside.  I did remind her that she needs to wear sunscreen even if she goes out for 5 minutes.  We took a shared decision making that she will stop pirfenidone for 2 weeks and then restart and monitor if the rash goes away.  Lung cancer screening: Recent CT scan of the chest May 2024 high-resolution CT chest for pulmonary fibrosis did not show any evidence of lung cancer  Smoking: We discussed quitting smoking.  She said that she quit for 9 years while living in [facility] but when her kids became teenagers and she relapsed into smoking.  She is now smoking 10 cigarettes/day.  We discussed Chantix and Zyban she does not want to do this.  She says she will try willpower..  She smokes with the left hand she smokes outdoors.  I advised her to cut down by 1 cigarette/week.  Also try using her right hand and try different places to smoke. she is going to try some of the strategies. Tital time spent on this >=  Social: Her daughter Bonita Quin had some breast tumors  removed fortunately they were benign.  But she also tore her ankle ligaments and is in a brace.   OV 11/10/2023  Subjective:  Patient ID: Alison Campbell, female , DOB: [birth year] , age 75 y.o. , MRN: [id number] , ADDRESS: [address] 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  - PFt May 2024 /NOv 2024  -Started pirfenidone towards the end of 2023 -low-dose protocol.  Mild associated emphysema   Coronary artery calcification -seen cardiology Dr. Mack Hook in 2022   Quit smoking 2023-relapsed as of January 2024.  Currently Esbriet/Pirfenidone requires intensive drug monitoring due to high concerns for Adverse effects of , including  Drug Induced Liver Injury, significant GI side effects that include but not limited to Diarrhea, Nausea, Vomiting,  and other system side effects that include Fatigue, headaches, weight loss and other side effects such as skin rash. These will be monitored with  blood work such as LFT initially once a month for 6 months and then quarterly     11/10/2023 -   Chief Complaint  Patient presents with   Follow-up    PFT's. Breathing has been stable and she is not coughing much.      HPI Alison Campbell 77 y.o. -presents with daughter Bonita Quin for follow-up.  Bonita Quin herself  had an ankle tear and she is recovering.  Patient herself in the interim while walking [place] the science center on 10[place]26/2023 tripped and fell and fractured the right kneecap.  She had surgery on 10/30/2023 and is recovering.  Chart review done and external record.  And diagnosis of right patella fracture confirmed and she had open reduction internal fixation.  She has a brace on Dr. Bonita Quin is worried about DVT risk and is concerned the patient may not be applying her TED stockings correctly patient is brought in wheelchair today because of the right knee issue.  Bonita Quin is an independent historian today.  IPF: Feels stable.  Pulmonary function test are stable.  Last CT was in spring 2024.  And then also in July 2024.  According to the radiologist there is no progression.  Personally visualized it and I do agree with it.  Therapeutic monitoring with pirfenidone: Liver function test normal October 2024 not much side effects at all.  Tolerating it well as a low-dose protocol.  She did have a skin rash last time but it appears this is because of Crestor.  She is now on Crestor only 3 times a week and the skin rash is warm.  Smoking: She continues to smoke.  After the fall she quit smoking on 10/30/2023.  Asking for nicotine patch.  Advised to get it over-the-counter.     SYMPTOM SCALE - ILD 07/03/2022 10/01/2022  05/21/2023  139# 11/10/2023 136#  Current weight      O2 use ra ra ra ra  Shortness of Breath 0 -> 5 scale with 5 being worst (score 6 If unable to do)   0  At rest 0 0 0 0  Simple tasks - showers, clothes change, eating, shaving 1 1 0  1/5  Household (dishes, doing bed, laundry) 0 2 1 3   Shopping 0 2 0 2  Walking level at own pace 0 3 0 1  Walking up Stairs 0 1 1 0  Total (30-36) Dyspnea Score 1 9 2 8       Non-dyspnea symptoms (0-> 5 scale) 07/03/2022 10/01/2022  05/21/2023   11/10/2023   How bad is your cough? 1 0 0 1  How bad is your fatigue 2 2 2 3   How bad is nausea 2 0 0 0  How bad  is vomiting?  0 0 0 0  How bad is diarrhea? 0 00 0 0  How bad is anxiety? 0 0 0 1  How bad is depression 0 0 0 0  Any chronic pain - if so where and how bad 0 0 x    Simple office walk 185 feet x  3 laps goal with forehead probe 07/03/2022  11/10/2023 Rt knee issue and on wheel cahir  O2 used ra   Number laps completed 3   Comments about pace avg   Resting Pulse Ox/HR 98% and 69/min   Final Pulse Ox/HR 96% and 96/min   Desaturated </= 88% no   Desaturated <= 3% points no   Got Tachycardic >/= 90/min yes   Symptoms at end of test No complaints   Miscellaneous comments x     PFT    Latest Ref Rng & Units 11/10/2023    8:42 AM 05/19/2023    3:28 PM 05/07/2022    1:36 PM  PFT Results  FVC-Pre L 2.48  P 2.26  1.97   FVC-Predicted Pre % 98  P 88  75   FVC-Post L   2.02   FVC-Predicted Post %   77   Pre FEV1/FVC % % 80  P 72  82   Post FEV1/FCV % %   77   FEV1-Pre L 1.98  P 1.63  1.62   FEV1-Predicted Pre % 105  P 85  83   FEV1-Post L   1.55   DLCO uncorrected ml/min/mmHg 14.88  P 12.06  12.18   DLCO UNC% % 81  P 65  66   DLCO corrected ml/min/mmHg 14.46  P 12.10  11.74   DLCO COR %Predicted % 79  P 66  63   DLVA Predicted % 90  P 88  87   TLC L   4.74   TLC % Predicted %   96   RV % Predicted %   118     P Preliminary result       LAB RESULTS last 96 hours No results found.  LAB RESULTS last 90 days Recent Results (from the past 2160 hour(s))  Hepatic function panel     Status: None   Collection Time: 09/01/23  9:13 AM  Result Value Ref Range   Total Protein 7.0 6.0 - 8.5 g/dL   Albumin 4.4 3.8 - 4.8 g/dL   Bilirubin Total 0.2 0.0 - 1.2 mg/dL   Bilirubin, Direct <2.84 0.00 - 0.40 mg/dL   Alkaline Phosphatase 75 44 - 121 IU/L   AST 17 0 - 40 IU/L   ALT 17 0 - 32 IU/L  Urinalysis, Routine w reflex microscopic     Status: None   Collection Time: 10/22/23 11:14 AM  Result Value Ref Range   Color, Urine YELLOW YELLOW   APPearance CLEAR CLEAR   Specific  Gravity, Urine 1.008 1.001 - 1.035   pH 5.5  5.0 - 8.0   Glucose, UA NEGATIVE NEGATIVE   Bilirubin Urine NEGATIVE NEGATIVE   Ketones, ur NEGATIVE NEGATIVE   Hgb urine dipstick NEGATIVE NEGATIVE   Protein, ur NEGATIVE NEGATIVE   Nitrite NEGATIVE NEGATIVE   Leukocytes,Ua NEGATIVE NEGATIVE  Microalbumin / creatinine urine ratio     Status: None   Collection Time: 10/22/23 11:14 AM  Result Value Ref Range   Creatinine, Urine 46 20 - 275 mg/dL   Microalb, Ur 0.2 mg/dL    Comment: Reference Range Not established    Microalb Creat Ratio 4 <30 mg/g creat    Comment: . The ADA defines abnormalities in albumin excretion as follows: Marland Kitchen Albuminuria Category        Result (mg/g creatinine) . Normal to Mildly increased   <30 Moderately increased         30-299  Severely increased           > OR = 300 . The ADA recommends that at least two of three specimens collected within a 3-6 month period be abnormal before considering a patient to be within a diagnostic category.   Parathyroid hormone, intact (no Ca)     Status: None   Collection Time: 10/22/23 11:14 AM  Result Value Ref Range   PTH 30 16 - 77 pg/mL    Comment: . Interpretive Guide    Intact PTH           Calcium ------------------    ----------           ------- Normal Parathyroid    Normal               Normal Hypoparathyroidism    Low or Low Normal    Low Hyperparathyroidism    Primary            Normal or High       High    Secondary          High                 Normal or Low    Tertiary           High                 High Non-Parathyroid    Hypercalcemia      Low or Low Normal    High .   CBC with Differential/Platelet     Status: None   Collection Time: 10/22/23 11:14 AM  Result Value Ref Range   WBC 9.1 3.8 - 10.8 Thousand/uL   RBC 4.71 3.80 - 5.10 Million/uL   Hemoglobin 14.4 11.7 - 15.5 g/dL   HCT 16.1 09.6 - 04.5 %   MCV 94.5 80.0 - 100.0 fL   MCH 30.6 27.0 - 33.0 pg   MCHC 32.4 32.0 - 36.0 g/dL    Comment:  For adults, a slight decrease in the calculated MCHC value (in the range of 30 to 32 g/dL) is most likely not clinically significant; however, it should be interpreted with caution in correlation with other red cell parameters and the patient's clinical condition.    RDW 12.2 11.0 - 15.0 %   Platelets 286 140 - 400 Thousand/uL   MPV 11.0 7.5 - 12.5 fL   Neutro Abs 6,579 1,500 - 7,800 cells/uL   Absolute Lymphocytes 1,866 850 - 3,900 cells/uL   Absolute Monocytes 446 200 - 950 cells/uL   Eosinophils Absolute 164 15 - 500 cells/uL   Basophils Absolute  46 0 - 200 cells/uL   Neutrophils Relative % 72.3 %   Total Lymphocyte 20.5 %   Monocytes Relative 4.9 %   Eosinophils Relative 1.8 %   Basophils Relative 0.5 %  COMPLETE METABOLIC PANEL WITH GFR     Status: Abnormal   Collection Time: 10/22/23 11:14 AM  Result Value Ref Range   Glucose, Bld 91 65 - 99 mg/dL    Comment: .            Fasting reference interval .    BUN 16 7 - 25 mg/dL   Creat 8.29 (H) 5.62 - 1.00 mg/dL   eGFR 55 (L) > OR = 60 mL/min/1.36m2   BUN/Creatinine Ratio 16 6 - 22 (calc)   Sodium 141 135 - 146 mmol/L   Potassium 4.9 3.5 - 5.3 mmol/L   Chloride 105 98 - 110 mmol/L   CO2 28 20 - 32 mmol/L   Calcium 10.4 8.6 - 10.4 mg/dL   Total Protein 7.1 6.1 - 8.1 g/dL   Albumin 4.4 3.6 - 5.1 g/dL   Globulin 2.7 1.9 - 3.7 g/dL (calc)   AG Ratio 1.6 1.0 - 2.5 (calc)   Total Bilirubin 0.4 0.2 - 1.2 mg/dL   Alkaline phosphatase (APISO) 63 37 - 153 U/L   AST 14 10 - 35 U/L   ALT 13 6 - 29 U/L  Magnesium     Status: None   Collection Time: 10/22/23 11:14 AM  Result Value Ref Range   Magnesium 2.0 1.5 - 2.5 mg/dL  Lipid panel     Status: Abnormal   Collection Time: 10/22/23 11:14 AM  Result Value Ref Range   Cholesterol 145 <200 mg/dL   HDL 50 > OR = 50 mg/dL   Triglycerides 130 (H) <150 mg/dL    Comment: . If a non-fasting specimen was collected, consider repeat triglyceride testing on a fasting specimen if  clinically indicated.  Perry Mount et al. J. of Clin. Lipidol. 2015;9:129-169. Marland Kitchen    LDL Cholesterol (Calc) 65 mg/dL (calc)    Comment: Reference range: <100 . Desirable range <100 mg/dL for primary prevention;   <70 mg/dL for patients with CHD or diabetic patients  with > or = 2 CHD risk factors. Marland Kitchen LDL-C is now calculated using the Martin-Hopkins  calculation, which is a validated novel method providing  better accuracy than the Friedewald equation in the  estimation of LDL-C.  Horald Pollen et al. Lenox Ahr. 8657;846(96): 2061-2068  (http://education.[facility].com/faq/FAQ164)    Total CHOL/HDL Ratio 2.9 <5.0 (calc)   Non-HDL Cholesterol (Calc) 95 <295 mg/dL (calc)    Comment: For patients with diabetes plus 1 major ASCVD risk  factor, treating to a non-HDL-C goal of <100 mg/dL  (LDL-C of <28 mg/dL) is considered a therapeutic  option.   TSH     Status: None   Collection Time: 10/22/23 11:14 AM  Result Value Ref Range   TSH 1.06 0.40 - 4.50 mIU/L  Hemoglobin A1c     Status: None   Collection Time: 10/22/23 11:14 AM  Result Value Ref Range   Hgb A1c MFr Bld 5.6 <5.7 % of total Hgb    Comment: For the purpose of screening for the presence of diabetes: . <5.7%       Consistent with the absence of diabetes 5.7-6.4%    Consistent with increased risk for diabetes             (prediabetes) > or =6.5%  Consistent with diabetes . This assay result  is consistent with a decreased risk of diabetes. . Currently, no consensus exists regarding use of hemoglobin A1c for diagnosis of diabetes in children. . According to American Diabetes Association (ADA) guidelines, hemoglobin A1c <7.0% represents optimal control in non-pregnant diabetic patients. Different metrics may apply to specific patient populations.  Standards of Medical Care in Diabetes(ADA). .    Mean Plasma Glucose 114 mg/dL   eAG (mmol/L) 6.3 mmol/L  Insulin, random     Status: None   Collection Time: 10/22/23 11:14 AM   Result Value Ref Range   Insulin 11.4 uIU/mL    Comment:       Reference Range  < or = 18.4 .       Risk:       Optimal          < or = 18.4       Moderate         NA       High             >18.4 .       Adult cardiovascular event risk category       cut points (optimal, moderate, high)       are based on Insulin Reference Interval       studies performed at [facility]       in 2022. Marland Kitchen   VITAMIN D 25 Hydroxy (Vit-D Deficiency, Fractures)     Status: None   Collection Time: 10/22/23 11:14 AM  Result Value Ref Range   Vit D, 25-Hydroxy 70 30 - 100 ng/mL    Comment: Vitamin D Status         25-OH Vitamin D: . Deficiency:                    <20 ng/mL Insufficiency:             20 - 29 ng/mL Optimal:                 > or = 30 ng/mL . For 25-OH Vitamin D testing on patients on  D2-supplementation and patients for whom quantitation  of D2 and D3 fractions is required, the QuestAssureD(TM) 25-OH VIT D, (D2,D3), LC/MS/MS is recommended: order  code 16109 (patients >74yrs). . See Note 1 . Note 1 . For additional information, please refer to  http://education.[facility].com/faq/FAQ199  (This link is being provided for informational/ educational purposes only.)   Pulmonary function test     Status: None (Preliminary result)   Collection Time: 11/10/23  8:42 AM  Result Value Ref Range   FVC-Pre 2.48 L   FVC-%Pred-Pre 98 %   FEV1-Pre 1.98 L   FEV1-%Pred-Pre 105 %   FEV6-Pre 2.48 L   FEV6-%Pred-Pre 103 %   Pre FEV1/FVC ratio 80 %   FEV1FVC-%Pred-Pre 107 %   Pre FEV6/FVC Ratio 100 %   FEV6FVC-%Pred-Pre 105 %   FEF 25-75 Pre 1.88 L/sec   FEF2575-%Pred-Pre 134 %   DLCO unc 14.88 ml/min/mmHg   DLCO unc % pred 81 %   DLCO cor 14.46 ml/min/mmHg   DLCO cor % pred 79 %   DL/VA 6.04 ml/min/mmHg/L   DL/VA % pred 90 %         has a past medical history of Anxiety, Atherosclerosis, CKD (chronic kidney disease) stage 3, GFR 30-59 ml/min (HCC) (12/24/2020), Colon  polyps, COPD (chronic obstructive pulmonary disease) (HCC), Costochondritis, Elevated blood pressure reading, GERD (gastroesophageal reflux disease), Hyperlipidemia, Insomnia, Osteoporosis, and Pre-diabetes.  reports that she has been smoking cigarettes. She started smoking about 58 years ago. She has a 57.6 pack-year smoking history. She has never used smokeless tobacco.  Past Surgical History:  Procedure Laterality Date   ABDOMINAL HYSTERECTOMY     COLONOSCOPY  09/10/2011   hysterectomy     INTERCOSTAL NERVE BLOCK Right 08/15/2022   Procedure: INTERCOSTAL NERVE BLOCK;  Surgeon: Loreli Slot, MD;  Location: [facility] OR;  Service: Thoracic;  Laterality: Right;   LUNG BIOPSY Right 08/15/2022   Procedure: LUNG BIOPSY;  Surgeon: Loreli Slot, MD;  Location: [facility] OR;  Service: Thoracic;  Laterality: Right;   ORIF PATELLA Right 10/30/2023   Procedure: OPEN REDUCTION INTERNAL FIXATION (ORIF) PATELLA;  Surgeon: Beverely Low, MD;  Location: [facility] ORS;  Service: Orthopedics;  Laterality: Right;  90   POLYPECTOMY     UVULECTOMY N/A 09/09/2016   Procedure: PARTIAL UVULECTOMY, direct laryngoscopy;  Surgeon: [name], MD;  Location: [facility];  Service: ENT;  Laterality: N/A;  PARTIAL UVULECTOMY, direct laryngoscopy    Allergies  Allergen Reactions   Fish Oil Diarrhea   Spiriva Respimat [Tiotropium Bromide Monohydrate] Cough    Gaging and excessive phlegm    Immunization History  Administered Date(s) Administered   Fluad Quad(high Dose 65+) 09/25/2022   Influenza, High Dose Seasonal PF 08/29/2013, 10/25/2015, 11/24/2017, 11/29/2018, 10/15/2021, 10/22/2023   Influenza,inj,Quad PF,6+ Mos 10/31/2022   PFIZER(Purple Top)SARS-COV-2 Vaccination 02/02/2020, 02/28/2020   Pneumococcal Conjugate-13 02/02/2015   Pneumococcal Polysaccharide-23 03/29/2010   Tdap 02/26/2009, 10/31/2022   Zoster, Live 12/29/2008    Family History  Problem Relation Age of Onset   Heart  disease Mother    Heart attack Mother    Heart disease Father    Diabetes Father    Heart attack Father    Cancer Brother        Lung   Bipolar disorder Daughter    Colon cancer Neg Hx    Esophageal cancer Neg Hx    Stomach cancer Neg Hx    Rectal cancer Neg Hx      Current Outpatient Medications:    acetaminophen (TYLENOL) 500 MG tablet, Take 1,000 mg by mouth every 8 (eight) hours as needed for mild pain (pain score 1-3) or moderate pain (pain score 4-6)., Disp: , Rfl:    aspirin EC 81 MG tablet, Take 1 tablet (81 mg total) by mouth in the morning and at bedtime. Swallow whole., Disp: 60 tablet, Rfl: 0   Calcium Carb-Cholecalciferol (CALCIUM 600 + D PO), Take 2 tablets by mouth daily., Disp: , Rfl:    cholecalciferol (VITAMIN D3) 25 MCG (1000 UNIT) tablet, Take 5,000 Units by mouth daily., Disp: , Rfl:    ezetimibe (ZETIA) 10 MG tablet, TAKE 1 TABLET BY MOUTH DAILY FOR CHOLESTEROL, Disp: 90 tablet, Rfl: 3   Ibuprofen 200 MG CAPS, Take 800 mg by mouth 2 (two) times daily as needed., Disp: , Rfl:    Multiple Vitamin (MULTIVITAMIN PO), Take 1 tablet by mouth daily., Disp: , Rfl:    Pirfenidone 267 MG TABS, Take 2 tablets (534 mg total) by mouth with breakfast, with lunch, and with evening meal., Disp: 540 tablet, Rfl: 1   Psyllium (METAMUCIL FIBER PO), Take 3 tablets by mouth daily., Disp: , Rfl:    rosuvastatin (CRESTOR) 20 MG tablet, Take 1/2 tablet (10 mg) 3 x / week for Cholesterol, Disp: 39 tablet, Rfl: 3   traZODone (DESYREL) 150 MG tablet, Take 1/2 to 1 tablet 1  hour before Bedtime if needed for Sleep                                  /                                 TAKE                                               BY                            MOUTH, Disp: 90 tablet, Rfl: 3      Objective:   Vitals:   11/10/23 1024  BP: 115/78  Pulse: 73  Temp: 98.3 F (36.8 C)  TempSrc: Oral  SpO2: 98%  Weight: 136 lb (61.7 kg)  Height: 5\' 3"  (1.6 m)    Estimated body mass index  is 24.09 kg/m as calculated from the following:   Height as of this encounter: 5\' 3"  (1.6 m).   Weight as of this encounter: 136 lb (61.7 kg).  @WEIGHTCHANGE @  [employer]   11/10/23 1024  Weight: 136 lb (61.7 kg)     Physical Exam   General: No distress. Looks same O2 at rest: no Cane present: no Sitting in wheel chair: YES due to RT KNEE ISSUE Frail: no Obese: no Neuro: Alert and Oriented x 3. GCS 15. Speech normal Psych: Pleasant Resp:  Barrel Chest - no.  Wheeze - no, Crackles - YES, No overt respiratory distress CVS: Normal heart sounds. Murmurs - n Ext: Stigmata of Connective Tissue Disease - oo. RT KNEE BRACE.  HEENT: Normal upper airway. PEERL +. No post nasal drip        Assessment:       ICD-10-CM   1. IPF (idiopathic pulmonary fibrosis) (HCC)  J84.112 CT Chest High Resolution    2. Encounter for therapeutic drug monitoring  Z51.81 Hepatic function panel    3. Rash, skin  R21     4. Smoker  F17.200     5. Abnormal CT lung screening  R91.8     6. Encounter for deep vein thrombosis (DVT) prophylaxis  Z29.9          Plan:     Patient Instructions  IPF (idiopathic pulmonary fibrosis) (HCC) Encounter for therapeutic drug monitoring   -Pulmonary fibrosis is stable based on symptoms and PFT .  This is good news. -Liver function test normal on pirfenidone  Oct 2024 -Overall tolerating pirfenidone well   Plan -Cotinue pirfenidone  LOW dose protocol  2 pills 3 times daily with food but you can try without food but overall do not recommed thatg -Reminder for you to apply sunscreen to the skin and sun exposed areas even when you go out for 5 minutes  - -Check liver function test in 3 months  - spiro/dlco in 6 months  - repeat HRCT in 9 months  Rash, skin  - improved after reducing the dose of Crestor - Unrelated to pirfenidone  Plan - Per primary care physician  Smoker Lung cancer screening  -Still ongoing problem and relapsed when  the kids were teenagers but recently improved after knee  issue 10/30/2023 - No evidence of lung cancer on recent CT scan chest May 2024  Plan  - Try nicotine patch over-the-counter -Respect not wanting to do Chantix or Zyban -In the future we can refer you to a quit smoking virtual clinic -Capture any lung cancer screening information on future CT scan of the chest for pulmonary fibrosis  Recent right knee fracture and DVT prevention  Plan - Keep your ankles mobilize - Keep yourself hydrated - Apply TED stockings diligently   Follow-up --In 3 months check liver function test - 30-minute visit with Dr. Marchelle Gearing in 6 months but after  PFT -(will order CT at that time)  -Symptom score and exercise hypoxemia test at follow-up   FOLLOWUP Return in about 6 months (around 05/09/2024) for ILD, 15 min visit, with Dr Marchelle Gearing, Face to Face Visit, after HRCT chest.    SIGNATURE    Dr. Kalman Shan, M.D., F.C.C.P,  Pulmonary and Critical Care Medicine Staff Physician, [facility] Director - Interstitial Lung Disease  Program  Pulmonary Fibrosis [facility] [place], [place], [address]  Pager: [phone], If no answer or between  15:00h - 7:00h: call [phone] Telephone: [phone]  2:37 PM 11/10/2023    HIGh Complexity  OFFICE   2021 E/M guidelines, first released in 2021, with minor revisions added in 2023. Must meet the requirements for 2 out of 3 dimensions to qualify.    Number and complexity of problems addressed Amount and/or complexity of data reviewed Risk of complications and/or morbidity  Severe exacerbation of chronic illness  Acute or chronic illnesses that may pose a threat to life or bodily function, e.g., multiple trauma, acute MI, pulmonary embolus, severe respiratory distress, progressive rheumatoid arthritis, psychiatric illness with potential threat to self or others, peritonitis, acute renal  failure, abrupt change in neurological status Must meet the requirements for 2 of 3 of the categories)  Category 1: Tests and documents, historian  Any combination of 3 of the following:  Assessment requiring an independent historian - Bonita Quin daughter  Review of prior external note(s) from each unique source  Review of results of each unique test  Ordering of each unique test    Category 2: Interpretation of tests    Independent interpretation of a test performed by another physician/other qualified health care professional (not separately reported) ct  Category 3: Discuss management/tests  Discussion of management or test interpretation with external physician/other qualified health care professional/appropriate source (not separately reported)  HIGH risk of morbidity from additional diagnostic testing or treatment Examples only:  Drug therapy requiring intensive monitoring for toxicity  Decision for elective major surgery with identified pateint or procedure risk factors  Decision regarding hospitalization or escalation of level of care  Decision for DNR or to de-escalate care   Parenteral controlled  substances

## 2023-11-09 NOTE — Patient Instructions (Signed)
IPF (idiopathic pulmonary fibrosis) (HCC) Encounter for therapeutic drug monitoring Rash, skin  -Pulmonary fibrosis is stable based on scan and breathing test and symptoms and CT scan.  This is good news. -Liver function test normal on pirfenidone May 2024 -Overall tolerating pirfenidone well except for concern of skin rash [see below] -However, you have new skin rash for the last 4 weeks on your forearms  -Unclear cause and could be because of bug spray or bug bite but could also be from sun exposure in the setting of pirfenidone  Plan -Stop pirfenidone for 2 weeks  -and then restart with 1 pill 3 times daily with food for 1 week and then go up to 2 pills 3 times daily and stay at that dose  -Reminder for you to apply sunscreen to the skin and sun exposed areas even when you go out for 5 minutes  -Call us in 4 or 6 weeks if the rash is not going away and we will refer you to dermatology -Check liver function test in 3 months  -Repeat spirometry and DLCO in 5 months  Smoker Lung cancer screening  -Still ongoing problem and relapsed when the kids were teenagers - No evidence of lung cancer on recent CT scan chest  Plan  - Detailed discussion and he will use willpower techniques to quit smoking including trying to smoke with the right hand or trying to move to different locations to smoke and trying to cut his cigarette by 1 cigarette/week -Respect not wanting to do Chantix or Zyban   Follow-up -In 4 weeks call us to update [place] about the skin rash -In 3 months check liver function test - 30-minute visit with Dr. Marchelle Gearing in 5 months but after pulmonary function test and CT scan  -Symptom score and exercise hypoxemia test at follow-up

## 2023-11-10 ENCOUNTER — Encounter: Payer: Self-pay | Admitting: Internal Medicine

## 2023-11-10 ENCOUNTER — Ambulatory Visit (HOSPITAL_BASED_OUTPATIENT_CLINIC_OR_DEPARTMENT_OTHER): Payer: Medicare Other | Admitting: Internal Medicine

## 2023-11-10 ENCOUNTER — Ambulatory Visit (INDEPENDENT_AMBULATORY_CARE_PROVIDER_SITE_OTHER): Payer: Medicare Other | Admitting: Internal Medicine

## 2023-11-10 VITALS — BP 115/78 | HR 73 | Temp 98.3°F | Ht 63.0 in | Wt 136.0 lb

## 2023-11-10 DIAGNOSIS — R21 Rash and other nonspecific skin eruption: Secondary | ICD-10-CM | POA: Diagnosis not present

## 2023-11-10 DIAGNOSIS — Z5181 Encounter for therapeutic drug level monitoring: Secondary | ICD-10-CM | POA: Diagnosis not present

## 2023-11-10 DIAGNOSIS — Z299 Encounter for prophylactic measures, unspecified: Secondary | ICD-10-CM | POA: Diagnosis not present

## 2023-11-10 DIAGNOSIS — F17200 Nicotine dependence, unspecified, uncomplicated: Secondary | ICD-10-CM

## 2023-11-10 DIAGNOSIS — R918 Other nonspecific abnormal finding of lung field: Secondary | ICD-10-CM

## 2023-11-10 DIAGNOSIS — J84112 Idiopathic pulmonary fibrosis: Secondary | ICD-10-CM

## 2023-11-10 LAB — PULMONARY FUNCTION TEST
DL/VA % pred: 90 %
DL/VA: 3.72 ml/min/mmHg/L
DLCO cor % pred: 79 %
DLCO cor: 14.46 ml/min/mmHg
DLCO unc % pred: 81 %
DLCO unc: 14.88 ml/min/mmHg
FEF 25-75 Pre: 1.88 L/s
FEF2575-%Pred-Pre: 134 %
FEV1-%Pred-Pre: 105 %
FEV1-Pre: 1.98 L
FEV1FVC-%Pred-Pre: 107 %
FEV6-%Pred-Pre: 103 %
FEV6-Pre: 2.48 L
FEV6FVC-%Pred-Pre: 105 %
FVC-%Pred-Pre: 98 %
FVC-Pre: 2.48 L
Pre FEV1/FVC ratio: 80 %
Pre FEV6/FVC Ratio: 100 %

## 2023-11-10 NOTE — Progress Notes (Signed)
Spirometry and DLCO Performed Today.  

## 2023-11-10 NOTE — Patient Instructions (Signed)
Spirometry and DLCO Performed Today.  

## 2023-11-12 DIAGNOSIS — Z4789 Encounter for other orthopedic aftercare: Secondary | ICD-10-CM | POA: Diagnosis not present

## 2023-11-19 ENCOUNTER — Other Ambulatory Visit: Payer: Self-pay

## 2023-11-20 DIAGNOSIS — M25561 Pain in right knee: Secondary | ICD-10-CM | POA: Diagnosis not present

## 2023-11-24 DIAGNOSIS — M25561 Pain in right knee: Secondary | ICD-10-CM | POA: Diagnosis not present

## 2023-11-30 ENCOUNTER — Other Ambulatory Visit: Payer: Self-pay

## 2023-11-30 NOTE — Progress Notes (Signed)
Specialty Pharmacy Refill Coordination Note  Alison Campbell is a 77 y.o. female contacted today regarding refills of specialty medication(s) Pirfenidone   Patient requested Delivery   Delivery date: 12/08/23   Verified address: [address]   Medication will be filled on 12/07/23.

## 2023-11-30 NOTE — Progress Notes (Signed)
Specialty Pharmacy Ongoing Clinical Assessment Note  Alison Campbell is a 77 y.o. female who is being followed by the specialty pharmacy service for RxSp Interstitial Lung Disease   Patient's specialty medication(s) reviewed today: Pirfenidone   Missed doses in the last 4 weeks: 0   Patient/Caregiver did not have any additional questions or concerns.   Therapeutic benefit summary: Patient is achieving benefit   Adverse events/side effects summary: No adverse events/side effects   Patient's therapy is appropriate to: Continue    Goals Addressed             This Visit's Progress    Stabilization of disease       Patient is on track. Patient will maintain adherence         Follow up:  6 months  Bobette Mo Specialty Pharmacist

## 2023-12-04 DIAGNOSIS — M25561 Pain in right knee: Secondary | ICD-10-CM | POA: Diagnosis not present

## 2023-12-07 ENCOUNTER — Other Ambulatory Visit: Payer: Self-pay

## 2023-12-10 DIAGNOSIS — M25561 Pain in right knee: Secondary | ICD-10-CM | POA: Diagnosis not present

## 2023-12-10 DIAGNOSIS — Z4789 Encounter for other orthopedic aftercare: Secondary | ICD-10-CM | POA: Diagnosis not present

## 2023-12-14 DIAGNOSIS — M25561 Pain in right knee: Secondary | ICD-10-CM | POA: Diagnosis not present

## 2023-12-17 ENCOUNTER — Other Ambulatory Visit: Payer: Self-pay

## 2023-12-24 ENCOUNTER — Other Ambulatory Visit: Payer: Self-pay

## 2023-12-24 DIAGNOSIS — M25561 Pain in right knee: Secondary | ICD-10-CM | POA: Diagnosis not present

## 2023-12-28 DIAGNOSIS — M25561 Pain in right knee: Secondary | ICD-10-CM | POA: Diagnosis not present

## 2024-01-06 DIAGNOSIS — M25561 Pain in right knee: Secondary | ICD-10-CM | POA: Diagnosis not present

## 2024-01-07 ENCOUNTER — Other Ambulatory Visit (HOSPITAL_COMMUNITY): Payer: Self-pay | Admitting: Pharmacy Technician

## 2024-01-07 ENCOUNTER — Other Ambulatory Visit (HOSPITAL_COMMUNITY): Payer: Self-pay

## 2024-01-07 DIAGNOSIS — Z4789 Encounter for other orthopedic aftercare: Secondary | ICD-10-CM | POA: Insufficient documentation

## 2024-01-07 NOTE — Progress Notes (Signed)
 Specialty Pharmacy Refill Coordination Note  Alison Campbell is a 78 y.o. female contacted today regarding refills of specialty medication(s) Pirfenidone    Patient requested Delivery   Delivery date: 01/14/24   Verified address: [address]  [place]   Medication will be filled on 01/13/24.

## 2024-01-13 ENCOUNTER — Other Ambulatory Visit: Payer: Self-pay

## 2024-02-03 ENCOUNTER — Other Ambulatory Visit (INDEPENDENT_AMBULATORY_CARE_PROVIDER_SITE_OTHER): Payer: Medicare Other

## 2024-02-03 ENCOUNTER — Ambulatory Visit: Payer: Medicare Other | Admitting: Nurse Practitioner

## 2024-02-03 DIAGNOSIS — Z5181 Encounter for therapeutic drug level monitoring: Secondary | ICD-10-CM | POA: Diagnosis not present

## 2024-02-03 LAB — HEPATIC FUNCTION PANEL
ALT: 14 U/L (ref 0–35)
AST: 17 U/L (ref 0–37)
Albumin: 4.3 g/dL (ref 3.5–5.2)
Alkaline Phosphatase: 62 U/L (ref 39–117)
Bilirubin, Direct: 0.1 mg/dL (ref 0.0–0.3)
Total Bilirubin: 0.3 mg/dL (ref 0.2–1.2)
Total Protein: 7.1 g/dL (ref 6.0–8.3)

## 2024-02-04 ENCOUNTER — Encounter: Payer: Self-pay | Admitting: Internal Medicine

## 2024-02-09 ENCOUNTER — Ambulatory Visit: Payer: Medicare Other | Admitting: Nurse Practitioner

## 2024-02-10 ENCOUNTER — Other Ambulatory Visit: Payer: Self-pay

## 2024-02-10 NOTE — Progress Notes (Signed)
Specialty Pharmacy Refill Coordination Note  Alison Campbell is a 78 y.o. female contacted today regarding refills of specialty medication(s) Pirfenidone   Patient requested Delivery   Delivery date: 02/24/24   Verified address: [address]  [name]   Medication will be filled on 02.25.25.

## 2024-02-23 ENCOUNTER — Other Ambulatory Visit: Payer: Self-pay

## 2024-03-03 DIAGNOSIS — J069 Acute upper respiratory infection, unspecified: Secondary | ICD-10-CM | POA: Diagnosis not present

## 2024-03-03 DIAGNOSIS — I10 Essential (primary) hypertension: Secondary | ICD-10-CM | POA: Diagnosis not present

## 2024-03-24 ENCOUNTER — Other Ambulatory Visit: Payer: Self-pay | Admitting: Pharmacy Technician

## 2024-03-24 ENCOUNTER — Other Ambulatory Visit: Payer: Self-pay | Admitting: Internal Medicine

## 2024-03-24 ENCOUNTER — Other Ambulatory Visit: Payer: Self-pay

## 2024-03-24 ENCOUNTER — Other Ambulatory Visit (HOSPITAL_COMMUNITY): Payer: Self-pay

## 2024-03-24 DIAGNOSIS — J84112 Idiopathic pulmonary fibrosis: Secondary | ICD-10-CM

## 2024-03-24 MED ORDER — PIRFENIDONE 267 MG PO TABS
534.0000 mg | ORAL_TABLET | Freq: Three times a day (TID) | ORAL | 1 refills | Status: DC
  Filled 2024-03-24: qty 180, 30d supply, fill #0
  Filled 2024-04-29: qty 180, 30d supply, fill #1
  Filled 2024-05-31: qty 180, 30d supply, fill #2
  Filled 2024-06-29 – 2024-08-01 (×2): qty 180, 30d supply, fill #3
  Filled 2024-08-30: qty 180, 30d supply, fill #4
  Filled 2024-09-28: qty 180, 30d supply, fill #5

## 2024-03-24 NOTE — Progress Notes (Signed)
 Specialty Pharmacy Refill Coordination Note  Alison Campbell is a 78 y.o. female contacted today regarding refills of specialty medication(s) Pirfenidone   Patient requested (Patient-Rptd) Delivery   Delivery date: (Patient-Rptd) 04/08/24   Verified address: (Patient-Rptd) [address]  [place]   Medication will be filled on 04/07/24.   RR sent to MD.

## 2024-04-07 ENCOUNTER — Other Ambulatory Visit: Payer: Self-pay

## 2024-04-28 NOTE — Telephone Encounter (Unsigned)
 Copied from CRM [id number]. Topic: General - Other >> Apr 27, 2024  9:30 AM Alison Campbell wrote: Reason for CRM:   Patient was calling in to verify an appt with Ramaswamy on 06/10. Advised she has an appt for CT scan on 05/16 but do not show any other future appts. She believes her daughter contacted clinic previously and scheduled the 06/10 date. Requested call from clinic to verify if this appt was made.   CB#  [phone]- asked for either the information to be texted to her or leave a detailed voicemail, as she will be in a dentist appt this morning.

## 2024-04-29 ENCOUNTER — Other Ambulatory Visit: Payer: Self-pay

## 2024-04-29 NOTE — Progress Notes (Signed)
 Specialty Pharmacy Refill Coordination Note  Alison Campbell is a 78 y.o. female contacted today regarding refills of specialty medication(s) Pirfenidone    Patient requested Delivery   Delivery date: 05/06/24   Verified address: [address]  [place]   Medication will be filled on 05.08.25.

## 2024-05-05 ENCOUNTER — Other Ambulatory Visit: Payer: Self-pay

## 2024-05-12 ENCOUNTER — Ambulatory Visit: Payer: Medicare Other | Admitting: Internal Medicine

## 2024-05-13 ENCOUNTER — Ambulatory Visit
Admission: RE | Admit: 2024-05-13 | Discharge: 2024-05-13 | Disposition: A | Discharge status: Home / Self Care | Source: Ambulatory Visit | Attending: Internal Medicine | Admitting: Internal Medicine

## 2024-05-13 DIAGNOSIS — J439 Emphysema, unspecified: Secondary | ICD-10-CM | POA: Diagnosis not present

## 2024-05-13 DIAGNOSIS — J84112 Idiopathic pulmonary fibrosis: Secondary | ICD-10-CM

## 2024-05-13 DIAGNOSIS — Z87891 Personal history of nicotine dependence: Secondary | ICD-10-CM | POA: Diagnosis not present

## 2024-05-13 DIAGNOSIS — E042 Nontoxic multinodular goiter: Secondary | ICD-10-CM | POA: Diagnosis not present

## 2024-05-13 DIAGNOSIS — J849 Interstitial pulmonary disease, unspecified: Secondary | ICD-10-CM | POA: Diagnosis not present

## 2024-05-18 ENCOUNTER — Telehealth: Payer: Self-pay | Admitting: Internal Medicine

## 2024-05-18 DIAGNOSIS — J84112 Idiopathic pulmonary fibrosis: Secondary | ICD-10-CM

## 2024-05-18 DIAGNOSIS — Z5181 Encounter for therapeutic drug level monitoring: Secondary | ICD-10-CM

## 2024-05-18 NOTE — Telephone Encounter (Signed)
 Alison Campbell  Daughter emailed me. Says patient is fatigued. Vsiit is only in July 2025. Could be esbrit. But daugher is worried about oxygenation  Plan  - do ONO  next few weeks - orderd - do 6 min walk test next few weeks  - ensure July visit  has PFTs with it       Latest Ref Rng & Units 11/10/2023    8:42 AM 05/19/2023    3:28 PM 05/07/2022    1:36 PM  PFT Results  FVC-Pre L 2.48  2.26  1.97   FVC-Predicted Pre % 98  88  75   FVC-Post L   2.02   FVC-Predicted Post %   77   Pre FEV1/FVC % % 80  72  82   Post FEV1/FCV % %   77   FEV1-Pre L 1.98  1.63  1.62   FEV1-Predicted Pre % 105  85  83   FEV1-Post L   1.55   DLCO uncorrected ml/min/mmHg 14.88  12.06  12.18   DLCO UNC% % 81  65  66   DLCO corrected ml/min/mmHg 14.46  12.10  11.74   DLCO COR %Predicted % 79  66  63   DLVA Predicted % 90  88  87   TLC L   4.74   TLC % Predicted %   96   RV % Predicted %   118

## 2024-05-18 NOTE — Telephone Encounter (Signed)
 MR- you have no appt I can schedule until 08/18/24. Do you want her to see APP?

## 2024-05-19 DIAGNOSIS — Z0001 Encounter for general adult medical examination with abnormal findings: Secondary | ICD-10-CM | POA: Diagnosis not present

## 2024-05-19 DIAGNOSIS — E782 Mixed hyperlipidemia: Secondary | ICD-10-CM | POA: Diagnosis not present

## 2024-05-19 DIAGNOSIS — Z0101 Encounter for examination of eyes and vision with abnormal findings: Secondary | ICD-10-CM | POA: Diagnosis not present

## 2024-05-19 DIAGNOSIS — E559 Vitamin D deficiency, unspecified: Secondary | ICD-10-CM | POA: Diagnosis not present

## 2024-05-19 DIAGNOSIS — I10 Essential (primary) hypertension: Secondary | ICD-10-CM | POA: Diagnosis not present

## 2024-05-19 DIAGNOSIS — R739 Hyperglycemia, unspecified: Secondary | ICD-10-CM | POA: Diagnosis not present

## 2024-05-19 DIAGNOSIS — R5383 Other fatigue: Secondary | ICD-10-CM | POA: Diagnosis not present

## 2024-05-19 NOTE — Telephone Encounter (Signed)
 She can do visit in August 2025 with PFT but in between next week or two should do ONO on Room air and 6 min walk test and I can communicate results with daughter

## 2024-05-19 NOTE — Telephone Encounter (Signed)
 I went ahead and scheduled the PFT and OV with MR for August  Called Linda, pt's daughter to tell her about the dates and see if they will work- there was no answer- LMTCB I need to speak with her to schedule the test

## 2024-05-21 ENCOUNTER — Other Ambulatory Visit (HOSPITAL_COMMUNITY): Payer: Self-pay

## 2024-05-30 NOTE — Telephone Encounter (Signed)
 Lm for patient's daughter, Linda(DPR) on number provided.

## 2024-05-30 NOTE — Telephone Encounter (Signed)
 Please call Alison Campbell back at [phone].  she said she sorry for the late reply.  She did not know you called until she was cleaning out her VM.

## 2024-05-30 NOTE — Telephone Encounter (Signed)
 Alison Campbell- I planned to order to be done when I was there so I could do the test but will not be back in the office x 2 wks- can you find a time where triage could do for her?

## 2024-05-31 ENCOUNTER — Other Ambulatory Visit: Payer: Self-pay

## 2024-05-31 NOTE — Progress Notes (Signed)
 Specialty Pharmacy Refill Coordination Note  Alison Campbell is a 78 y.o. female contacted today regarding refills of specialty medication(s) Pirfenidone    Patient requested Delivery   Delivery date: 06/07/24   Verified address: [address]  [place]   Medication will be filled on 06/06/24.

## 2024-05-31 NOTE — Progress Notes (Signed)
 Specialty Pharmacy Ongoing Clinical Assessment Note  Alison Campbell is a 78 y.o. female who is being followed by the specialty pharmacy service for RxSp Interstitial Lung Disease   Patient's specialty medication(s) reviewed today: Pirfenidone    Missed doses in the last 4 weeks: 0   Patient/Caregiver did not have any additional questions or concerns.   Therapeutic benefit summary: Patient is achieving benefit   Adverse events/side effects summary: No adverse events/side effects   Patient's therapy is appropriate to: Continue    Goals Addressed             This Visit's Progress    Stabilization of disease   On track    Patient is on track. Patient will maintain adherence         Follow up: 6 months  Miciah Covelli M Kagen Kunath Specialty Pharmacist

## 2024-06-01 NOTE — Telephone Encounter (Signed)
 [name] upset with Phone Tag. She is a [employer] fundraiser and said she will just email Dr. Bertrum Brodie to ask him about the ONO and . I advised I would still let the nurse know. Everyone tells her we will get back to her in 24 hours, she said. TY

## 2024-06-02 ENCOUNTER — Telehealth: Payer: Self-pay | Admitting: Internal Medicine

## 2024-06-02 NOTE — Telephone Encounter (Signed)
 Alison Campbell = daughter sent an email upset with phone tags and delays in scheduling overnight pulse oximetry and exercise hypoxemia test.   - Can you please get the overnight pulse oximetry done? - Please see if we can get the 6-minute walk test done - > if not at least a simple walking test of 3 laps in the office = Also I can see her in July 2025 at 830 or 430 slot [copying [place])   Thanks    SIGNATURE    Dr. Maire Scot, M.D., F.C.C.P,  Pulmonary and Critical Care Medicine Staff Physician, [facility] Center Director - Interstitial Lung Disease  Program  Pulmonary Fibrosis [facility] at [facility] [place], [place], [address]   Pager: [phone], If no answer  -> Check AMION or Try [phone] Telephone (clinical office): [phone] Telephone (research): [phone]  5:44 PM 06/02/2024

## 2024-06-03 NOTE — Telephone Encounter (Signed)
 The ONO has already been ordered (05/18/24) I went ahead and scheduled for for 06/07/24  Ov with MR 07/20/24   I sent pt notification via mychart with appt dates/times and asked she call to reschedule if they do not work for her   PCC's will you please contact the DME and ask that they schedule this ASAP. Thanks so much!

## 2024-06-03 NOTE — Telephone Encounter (Signed)
Lm for pt's daughter, Linda(DPR).

## 2024-06-06 ENCOUNTER — Other Ambulatory Visit: Payer: Self-pay

## 2024-06-06 NOTE — Telephone Encounter (Signed)
 Lm x2 for patient's daughter Linda(DPR)

## 2024-06-07 ENCOUNTER — Encounter (HOSPITAL_COMMUNITY): Payer: Self-pay | Admitting: Neurology

## 2024-06-07 ENCOUNTER — Emergency Department (HOSPITAL_COMMUNITY)

## 2024-06-07 ENCOUNTER — Inpatient Hospital Stay (HOSPITAL_COMMUNITY)
Admission: EM | Admit: 2024-06-07 | Discharge: 2024-06-14 | DRG: 064 | Disposition: A | Discharge status: Home / Self Care | Attending: Internal Medicine | Admitting: Internal Medicine

## 2024-06-07 ENCOUNTER — Inpatient Hospital Stay (HOSPITAL_COMMUNITY)

## 2024-06-07 ENCOUNTER — Other Ambulatory Visit: Payer: Self-pay

## 2024-06-07 ENCOUNTER — Ambulatory Visit

## 2024-06-07 DIAGNOSIS — N183 Chronic kidney disease, stage 3 unspecified: Secondary | ICD-10-CM | POA: Diagnosis not present

## 2024-06-07 DIAGNOSIS — Z781 Physical restraint status: Secondary | ICD-10-CM

## 2024-06-07 DIAGNOSIS — Z818 Family history of other mental and behavioral disorders: Secondary | ICD-10-CM | POA: Diagnosis not present

## 2024-06-07 DIAGNOSIS — Z888 Allergy status to other drugs, medicaments and biological substances status: Secondary | ICD-10-CM

## 2024-06-07 DIAGNOSIS — J449 Chronic obstructive pulmonary disease, unspecified: Secondary | ICD-10-CM | POA: Diagnosis present

## 2024-06-07 DIAGNOSIS — I129 Hypertensive chronic kidney disease with stage 1 through stage 4 chronic kidney disease, or unspecified chronic kidney disease: Secondary | ICD-10-CM | POA: Diagnosis not present

## 2024-06-07 DIAGNOSIS — I611 Nontraumatic intracerebral hemorrhage in hemisphere, cortical: Secondary | ICD-10-CM | POA: Diagnosis not present

## 2024-06-07 DIAGNOSIS — J8410 Pulmonary fibrosis, unspecified: Secondary | ICD-10-CM | POA: Diagnosis not present

## 2024-06-07 DIAGNOSIS — G928 Other toxic encephalopathy: Secondary | ICD-10-CM | POA: Diagnosis present

## 2024-06-07 DIAGNOSIS — K219 Gastro-esophageal reflux disease without esophagitis: Secondary | ICD-10-CM | POA: Diagnosis present

## 2024-06-07 DIAGNOSIS — E785 Hyperlipidemia, unspecified: Secondary | ICD-10-CM | POA: Diagnosis not present

## 2024-06-07 DIAGNOSIS — F419 Anxiety disorder, unspecified: Secondary | ICD-10-CM | POA: Diagnosis present

## 2024-06-07 DIAGNOSIS — Z801 Family history of malignant neoplasm of trachea, bronchus and lung: Secondary | ICD-10-CM

## 2024-06-07 DIAGNOSIS — R569 Unspecified convulsions: Secondary | ICD-10-CM | POA: Diagnosis not present

## 2024-06-07 DIAGNOSIS — R519 Headache, unspecified: Secondary | ICD-10-CM | POA: Diagnosis not present

## 2024-06-07 DIAGNOSIS — E876 Hypokalemia: Secondary | ICD-10-CM | POA: Diagnosis not present

## 2024-06-07 DIAGNOSIS — Z833 Family history of diabetes mellitus: Secondary | ICD-10-CM | POA: Diagnosis not present

## 2024-06-07 DIAGNOSIS — T508X4A Poisoning by diagnostic agents, undetermined, initial encounter: Secondary | ICD-10-CM | POA: Diagnosis not present

## 2024-06-07 DIAGNOSIS — T508X5A Adverse effect of diagnostic agents, initial encounter: Secondary | ICD-10-CM | POA: Diagnosis not present

## 2024-06-07 DIAGNOSIS — F17210 Nicotine dependence, cigarettes, uncomplicated: Secondary | ICD-10-CM | POA: Diagnosis not present

## 2024-06-07 DIAGNOSIS — I612 Nontraumatic intracerebral hemorrhage in hemisphere, unspecified: Secondary | ICD-10-CM | POA: Diagnosis not present

## 2024-06-07 DIAGNOSIS — I161 Hypertensive emergency: Secondary | ICD-10-CM | POA: Diagnosis present

## 2024-06-07 DIAGNOSIS — G936 Cerebral edema: Secondary | ICD-10-CM | POA: Diagnosis not present

## 2024-06-07 DIAGNOSIS — R339 Retention of urine, unspecified: Secondary | ICD-10-CM | POA: Diagnosis not present

## 2024-06-07 DIAGNOSIS — Z79899 Other long term (current) drug therapy: Secondary | ICD-10-CM | POA: Diagnosis not present

## 2024-06-07 DIAGNOSIS — R4701 Aphasia: Secondary | ICD-10-CM | POA: Diagnosis not present

## 2024-06-07 DIAGNOSIS — Z8249 Family history of ischemic heart disease and other diseases of the circulatory system: Secondary | ICD-10-CM | POA: Diagnosis not present

## 2024-06-07 DIAGNOSIS — Z90710 Acquired absence of both cervix and uterus: Secondary | ICD-10-CM

## 2024-06-07 DIAGNOSIS — N179 Acute kidney failure, unspecified: Secondary | ICD-10-CM | POA: Diagnosis present

## 2024-06-07 DIAGNOSIS — R29704 NIHSS score 4: Secondary | ICD-10-CM | POA: Diagnosis present

## 2024-06-07 DIAGNOSIS — R29818 Other symptoms and signs involving the nervous system: Secondary | ICD-10-CM | POA: Diagnosis not present

## 2024-06-07 DIAGNOSIS — I6782 Cerebral ischemia: Secondary | ICD-10-CM | POA: Diagnosis not present

## 2024-06-07 DIAGNOSIS — Y92238 Other place in hospital as the place of occurrence of the external cause: Secondary | ICD-10-CM | POA: Diagnosis present

## 2024-06-07 DIAGNOSIS — R0902 Hypoxemia: Secondary | ICD-10-CM | POA: Diagnosis not present

## 2024-06-07 DIAGNOSIS — I10 Essential (primary) hypertension: Secondary | ICD-10-CM | POA: Diagnosis not present

## 2024-06-07 DIAGNOSIS — R451 Restlessness and agitation: Secondary | ICD-10-CM | POA: Diagnosis not present

## 2024-06-07 DIAGNOSIS — Z8673 Personal history of transient ischemic attack (TIA), and cerebral infarction without residual deficits: Secondary | ICD-10-CM | POA: Diagnosis not present

## 2024-06-07 DIAGNOSIS — N1831 Chronic kidney disease, stage 3a: Secondary | ICD-10-CM | POA: Diagnosis not present

## 2024-06-07 DIAGNOSIS — I6389 Other cerebral infarction: Secondary | ICD-10-CM | POA: Diagnosis not present

## 2024-06-07 DIAGNOSIS — G4489 Other headache syndrome: Secondary | ICD-10-CM | POA: Diagnosis not present

## 2024-06-07 DIAGNOSIS — I619 Nontraumatic intracerebral hemorrhage, unspecified: Secondary | ICD-10-CM | POA: Diagnosis not present

## 2024-06-07 DIAGNOSIS — R609 Edema, unspecified: Secondary | ICD-10-CM | POA: Diagnosis not present

## 2024-06-07 DIAGNOSIS — Z860100 Personal history of colon polyps, unspecified: Secondary | ICD-10-CM

## 2024-06-07 DIAGNOSIS — Z91018 Allergy to other foods: Secondary | ICD-10-CM

## 2024-06-07 LAB — DIFFERENTIAL
Abs Immature Granulocytes: 0.03 10*3/uL (ref 0.00–0.07)
Basophils Absolute: 0.1 10*3/uL (ref 0.0–0.1)
Basophils Relative: 1 %
Eosinophils Absolute: 0.3 10*3/uL (ref 0.0–0.5)
Eosinophils Relative: 3 %
Immature Granulocytes: 0 %
Lymphocytes Relative: 23 %
Lymphs Abs: 2 10*3/uL (ref 0.7–4.0)
Monocytes Absolute: 0.8 10*3/uL (ref 0.1–1.0)
Monocytes Relative: 10 %
Neutro Abs: 5.4 10*3/uL (ref 1.7–7.7)
Neutrophils Relative %: 63 %

## 2024-06-07 LAB — I-STAT CHEM 8, ED
BUN: 23 mg/dL (ref 8–23)
Calcium, Ion: 1.06 mmol/L — ABNORMAL LOW (ref 1.15–1.40)
Chloride: 108 mmol/L (ref 98–111)
Creatinine, Ser: 1.3 mg/dL — ABNORMAL HIGH (ref 0.44–1.00)
Glucose, Bld: 93 mg/dL (ref 70–99)
HCT: 39 % (ref 36.0–46.0)
Hemoglobin: 13.3 g/dL (ref 12.0–15.0)
Potassium: 3.3 mmol/L — ABNORMAL LOW (ref 3.5–5.1)
Sodium: 140 mmol/L (ref 135–145)
TCO2: 24 mmol/L (ref 22–32)

## 2024-06-07 LAB — COMPREHENSIVE METABOLIC PANEL WITH GFR
ALT: 18 U/L (ref 0–44)
AST: 17 U/L (ref 15–41)
Albumin: 3.4 g/dL — ABNORMAL LOW (ref 3.5–5.0)
Alkaline Phosphatase: 67 U/L (ref 38–126)
Anion gap: 12 (ref 5–15)
BUN: 21 mg/dL (ref 8–23)
CO2: 21 mmol/L — ABNORMAL LOW (ref 22–32)
Calcium: 9.4 mg/dL (ref 8.9–10.3)
Chloride: 107 mmol/L (ref 98–111)
Creatinine, Ser: 1.16 mg/dL — ABNORMAL HIGH (ref 0.44–1.00)
GFR, Estimated: 48 mL/min — ABNORMAL LOW (ref >60)
Glucose, Bld: 94 mg/dL (ref 70–99)
Potassium: 3.4 mmol/L — ABNORMAL LOW (ref 3.5–5.1)
Sodium: 140 mmol/L (ref 135–145)
Total Bilirubin: 0.6 mg/dL (ref 0.0–1.2)
Total Protein: 6.5 g/dL (ref 6.5–8.1)

## 2024-06-07 LAB — APTT: aPTT: 28 s (ref 24–36)

## 2024-06-07 LAB — CBC
HCT: 40.8 % (ref 36.0–46.0)
HCT: 41 % (ref 36.0–46.0)
Hemoglobin: 13.4 g/dL (ref 12.0–15.0)
Hemoglobin: 13.5 g/dL (ref 12.0–15.0)
MCH: 30.8 pg (ref 26.0–34.0)
MCH: 30.6 pg (ref 26.0–34.0)
MCHC: 32.8 g/dL (ref 30.0–36.0)
MCHC: 32.9 g/dL (ref 30.0–36.0)
MCV: 93.8 fL (ref 80.0–100.0)
MCV: 93 fL (ref 80.0–100.0)
Platelets: 284 10*3/uL (ref 150–400)
Platelets: 259 10*3/uL (ref 150–400)
RBC: 4.35 MIL/uL (ref 3.87–5.11)
RBC: 4.41 MIL/uL (ref 3.87–5.11)
RDW: 13 % (ref 11.5–15.5)
RDW: 13.2 % (ref 11.5–15.5)
WBC: 8.6 10*3/uL (ref 4.0–10.5)
WBC: 9.8 10*3/uL (ref 4.0–10.5)
nRBC: 0 % (ref 0.0–0.2)
nRBC: 0 % (ref 0.0–0.2)

## 2024-06-07 LAB — MRSA NEXT GEN BY PCR, NASAL: MRSA by PCR Next Gen: NOT DETECTED

## 2024-06-07 LAB — CBG MONITORING, ED: Glucose-Capillary: 81 mg/dL (ref 70–99)

## 2024-06-07 LAB — ETHANOL: Alcohol, Ethyl (B): <15 mg/dL (ref <15)

## 2024-06-07 LAB — PROTIME-INR
INR: 0.9 (ref 0.8–1.2)
Prothrombin Time: 12.7 s (ref 11.4–15.2)

## 2024-06-07 LAB — TROPONIN I (HIGH SENSITIVITY): Troponin I (High Sensitivity): 4 ng/L (ref <18)

## 2024-06-07 MED ORDER — LABETALOL HCL 5 MG/ML IV SOLN: 10.0000 mg | Freq: Once | INTRAVENOUS | Status: DC

## 2024-06-07 MED ORDER — SENNOSIDES-DOCUSATE SODIUM 8.6-50 MG PO TABS
1.0000 | ORAL_TABLET | Freq: Two times a day (BID) | ORAL | Status: DC
  Administered 2024-06-07 – 2024-06-14 (×4): 1 via ORAL
  Filled 2024-06-07 (×9): qty 1

## 2024-06-07 MED ORDER — PANTOPRAZOLE SODIUM 40 MG IV SOLR
40.0000 mg | Freq: Every day | INTRAVENOUS | Status: DC
  Administered 2024-06-07: 40 mg via INTRAVENOUS
  Filled 2024-06-07: qty 10

## 2024-06-07 MED ORDER — CHLORHEXIDINE GLUCONATE CLOTH 2 % EX PADS
6.0000 | MEDICATED_PAD | Freq: Every day | CUTANEOUS | Status: DC
  Administered 2024-06-07 – 2024-06-10 (×5): 6 via TOPICAL

## 2024-06-07 MED ORDER — ORAL CARE MOUTH RINSE: 15.0000 mL | OROMUCOSAL | Status: DC | PRN

## 2024-06-07 MED ORDER — LABETALOL HCL 5 MG/ML IV SOLN
10.0000 mg | Freq: Once | INTRAVENOUS | Status: AC
  Administered 2024-06-07: 10 mg via INTRAVENOUS

## 2024-06-07 MED ORDER — STROKE: EARLY STAGES OF RECOVERY BOOK
Freq: Once | Status: AC
  Administered 2024-06-08: 1
  Filled 2024-06-07: qty 1

## 2024-06-07 MED ORDER — ACETAMINOPHEN 650 MG RE SUPP: 650.0000 mg | RECTAL | Status: DC | PRN

## 2024-06-07 MED ORDER — CLEVIDIPINE BUTYRATE 0.5 MG/ML IV EMUL: 0.0000 mg/h | INTRAVENOUS | Status: DC

## 2024-06-07 MED ORDER — ENSURE PLUS HIGH PROTEIN PO LIQD
237.0000 mL | Freq: Two times a day (BID) | ORAL | Status: DC
  Administered 2024-06-08 – 2024-06-14 (×6): 237 mL via ORAL

## 2024-06-07 MED ORDER — SODIUM CHLORIDE 0.9% FLUSH
3.0000 mL | Freq: Once | INTRAVENOUS | Status: AC
  Administered 2024-06-07: 3 mL via INTRAVENOUS

## 2024-06-07 MED ORDER — ACETAMINOPHEN 160 MG/5ML PO SOLN: 650.0000 mg | ORAL | Status: DC | PRN

## 2024-06-07 MED ORDER — ACETAMINOPHEN 325 MG PO TABS
650.0000 mg | ORAL_TABLET | ORAL | Status: DC | PRN
  Administered 2024-06-07 – 2024-06-08 (×3): 650 mg via ORAL
  Filled 2024-06-07 (×4): qty 2

## 2024-06-07 MED ORDER — CLEVIDIPINE BUTYRATE 0.5 MG/ML IV EMUL
0.0000 mg/h | INTRAVENOUS | Status: DC
  Administered 2024-06-07: 2 mg/h via INTRAVENOUS
  Filled 2024-06-07: qty 50

## 2024-06-07 NOTE — H&P (Signed)
 NEUROLOGY H&P NOTE   Date of service: June 07, 2024 Patient Name: Alison Campbell MRN:  [id number] DOB:  06-13-1946 Chief Complaint: "aphasia"  History of Present Illness  Alison Campbell is an 78 y.o. female with hx of pulmonary fibrosis and hyperlipidemia who presents with acute onset aphasia.  Patient reports a right sided headache which started yesterday.  Today, she was in her usual state of health when seen by her daughter at 85 and then developed difficulty naming objects and following commands.  Patient reports no recent symptoms other than headache but has difficulty remembering common things, such as what city she is in.  She is typically independent with her ADLs and still drives.  Will keep patient as full code as she is unable to have discussion of code status at this time. CT head reveals an acute right temporal lobe hemorrhage.   Last known well: 1340 Modified rankin score: 1-No significant post stroke disability and can perform usual duties with stroke symptoms ICH Score: 1   NIHSS components Score: Comment  1a Level of Conscious 0[]  1[x]  2[]  3[]      1b LOC Questions 0[x]  1[]  2[]       1c LOC Commands 0[x]  1[]  2[]       2 Best Gaze 0[x]  1[]  2[]       3 Visual 0[]  1[]  2[x]  3[]      4 Facial Palsy 0[x]  1[]  2[]  3[]      5a Motor Arm - left 0[x]  1[]  2[]  3[]  4[]  UN[]    5b Motor Arm - Right 0[x]  1[]  2[]  3[]  4[]  UN[]    6a Motor Leg - Left 0[x]  1[]  2[]  3[]  4[]  UN[]    6b Motor Leg - Right 0[x]  1[]  2[]  3[]  4[]  UN[]    7 Limb Ataxia 0[x]  1[]  2[]  UN[]      8 Sensory 0[x]  1[]  2[]  UN[]      9 Best Language 0[]  1[x]  2[]  3[]      10 Dysarthria 0[x]  1[]  2[]  UN[]      11 Extinct. and Inattention 0[x]  1[]  2[]       TOTAL:4       ROS  Comprehensive ROS performed and pertinent positives documented in the HPI.  Past History   Past Medical History:  Diagnosis Date   Anxiety    Atherosclerosis    CKD (chronic kidney disease) stage 3, GFR 30-59 ml/min (HCC) 12/24/2020   Colon polyps     COPD (chronic obstructive pulmonary disease) (HCC)    pulmonary fibrosis   Costochondritis    Elevated blood pressure reading    no meds   GERD (gastroesophageal reflux disease)    coffee related   Hyperlipidemia    Insomnia    Osteoporosis    Pre-diabetes    Past Surgical History:  Procedure Laterality Date   ABDOMINAL HYSTERECTOMY     COLONOSCOPY  09/10/2011   hysterectomy     INTERCOSTAL NERVE BLOCK Right 08/15/2022   Procedure: INTERCOSTAL NERVE BLOCK;  Surgeon: Zelphia Higashi, MD;  Location: [facility] OR;  Service: Thoracic;  Laterality: Right;   LUNG BIOPSY Right 08/15/2022   Procedure: LUNG BIOPSY;  Surgeon: Zelphia Higashi, MD;  Location: [facility] OR;  Service: Thoracic;  Laterality: Right;   ORIF PATELLA Right 10/30/2023   Procedure: OPEN REDUCTION INTERNAL FIXATION (ORIF) PATELLA;  Surgeon: Winston Hawking, MD;  Location: [facility] ORS;  Service: Orthopedics;  Laterality: Right;  90   POLYPECTOMY     UVULECTOMY N/A 09/09/2016   Procedure: PARTIAL UVULECTOMY, direct laryngoscopy;  Surgeon: Anette Barb  Odean Bend, MD;  Location: [facility];  Service: ENT;  Laterality: N/A;  PARTIAL UVULECTOMY, direct laryngoscopy   Family History  Problem Relation Age of Onset   Heart disease Mother    Heart attack Mother    Heart disease Father    Diabetes Father    Heart attack Father    Cancer Brother        Lung   Bipolar disorder Daughter    Colon cancer Neg Hx    Esophageal cancer Neg Hx    Stomach cancer Neg Hx    Rectal cancer Neg Hx    Social History   Socioeconomic History   Marital status: Widowed    Spouse name: Not on file   Number of children: Not on file   Years of education: Not on file   Highest education level: Not on file  Occupational History   Not on file  Tobacco Use   Smoking status: Every Day    Current packs/day: 0.00    Average packs/day: 1 pack/day for 57.6 years (57.6 ttl pk-yrs)    Types: Cigarettes    Start date: 1966    Last attempt to  quit: 08/15/2022    Years since quitting: 1.8   Smokeless tobacco: Never  Vaping Use   Vaping status: Never Used  Substance and Sexual Activity   Alcohol use: No    Alcohol/week: 0.0 standard drinks of alcohol   Drug use: No   Sexual activity: Not Currently  Other Topics Concern   Not on file  Social History Narrative   Not on file   Social Drivers of Health   Financial Resource Strain: Not on file  Food Insecurity: No Food Insecurity (10/30/2023)   Hunger Vital Sign    Worried About Running Out of Food in the Last Year: Never true    Ran Out of Food in the Last Year: Never true  Transportation Needs: No Transportation Needs (10/30/2023)   PRAPARE - Administrator, Civil Service (Medical): No    Lack of Transportation (Non-Medical): No  Physical Activity: Not on file  Stress: Not on file  Social Connections: Not on file   Allergies  Allergen Reactions   Fish Oil Diarrhea   Spiriva  Respimat [Tiotropium Bromide Monohydrate ] Cough    Gaging and excessive phlegm    Medications    No current facility-administered medications on file prior to encounter.   Current Outpatient Medications on File Prior to Encounter  Medication Sig Dispense Refill   acetaminophen  (TYLENOL ) 500 MG tablet Take 1,000 mg by mouth every 8 (eight) hours as needed for mild pain (pain score 1-3) or moderate pain (pain score 4-6).     Calcium  Carb-Cholecalciferol  (CALCIUM  600 + D PO) Take 2 tablets by mouth daily.     cholecalciferol  (VITAMIN D3) 25 MCG (1000 UNIT) tablet Take 5,000 Units by mouth daily.     ezetimibe  (ZETIA ) 10 MG tablet TAKE 1 TABLET BY MOUTH DAILY FOR CHOLESTEROL 90 tablet 3   Ibuprofen  200 MG CAPS Take 800 mg by mouth 2 (two) times daily as needed.     Multiple Vitamin (MULTIVITAMIN PO) Take 1 tablet by mouth daily.     Pirfenidone  267 MG TABS Take 2 tablets (534 mg total) by mouth with breakfast, with lunch, and with evening meal. 540 tablet 1   Psyllium (METAMUCIL FIBER  PO) Take 3 tablets by mouth daily.     rosuvastatin  (CRESTOR ) 20 MG tablet Take 1/2 tablet (10 mg)  3 x / week for Cholesterol 39 tablet 3   traZODone  (DESYREL ) 150 MG tablet Take 1/2 to 1 tablet 1 hour before Bedtime if needed for Sleep                                  /                                 TAKE                                               BY                            MOUTH 90 tablet 3     Vitals   Vitals:   06/07/24 1540 06/07/24 1545 06/07/24 1600 06/07/24 1603  BP: (!) 155/73 (!) 142/63 (!) 140/66   Pulse: 69     Resp: 16     Temp: 97.7 F (36.5 C)     TempSrc: Oral     SpO2: 95%     Weight:    60.2 kg  Height:    5\' 3"  (1.6 m)     Body mass index is 23.51 kg/m.  Physical Exam   Constitutional:  Ill-appearing elderly patient in some distress due to headache  Psych: Affect appropriate to situation.  Eyes: No scleral injection.  HENT: No OP obstruction.  Head: Normocephalic.  Cardiovascular: Normal rate and regular rhythm.  Respiratory: Effort normal, non-labored breathing.  GI: Soft.  Active bowel sounds  Skin: WDI.   Neurologic Examination   NEURO:  Mental Status: Awake with decreased level of alertness. Oriented to person, but unable to state what city she is in; states correct month but has difficulty recalling information during conversation. Speech/Language: speech is without dysarthria but with mild word finding and object naming difficulty. Repetition intact   Cranial Nerves:  II: PERRL. Left hemianopsia III, IV, VI: EOMI. Eyelids elevate symmetrically.  V: Sensation is intact to light touch and symmetrical to face.  VII: Smile is symmetrical.  VIII: hearing intact to voice. IX, X: Phonation is normal.  XII: tongue is midline without fasciculations. Motor: Able to move all four extremities with good antigravity strength Tone: is normal and bulk is normal Sensation- Intact to light touch bilaterally.  Coordination: unable to perform Gait-  deferred   Labs   CBC:  Recent Labs  Lab 06/07/24 1516 06/07/24 1526  WBC 8.6  --   NEUTROABS 5.4  --   HGB 13.4 13.3  HCT 40.8 39.0  MCV 93.8  --   PLT 284  --     Basic Metabolic Panel:  Lab Results  Component Value Date   NA 140 06/07/2024   K 3.3 (L) 06/07/2024   CO2 21 (L) 06/07/2024   GLUCOSE 93 06/07/2024   BUN 23 06/07/2024   CREATININE 1.30 (H) 06/07/2024   CALCIUM  9.4 06/07/2024   GFRNONAA 48 (L) 06/07/2024   GFRAA 56 (L) 06/18/2020   Lipid Panel:  Lab Results  Component Value Date   LDLCALC 65 10/22/2023   HgbA1c:  Lab Results  Component Value Date   HGBA1C 5.6 10/22/2023   Urine Drug  Screen: No results found for: "LABOPIA", "COCAINSCRNUR", "LABBENZ", "AMPHETMU", "THCU", "LABBARB"  Alcohol Level     Component Value Date/Time   ETH <15 06/07/2024 1516   INR  Lab Results  Component Value Date   INR 0.9 06/07/2024   APTT  Lab Results  Component Value Date   APTT 28 06/07/2024     CT Head without contrast(Personally reviewed): 5.2 mm acute IPH within posterior right temporal lobe with mild surrounding edema, chronic small vessel ischemic changes and 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 female with hx of pulmonary fibrosis and hyperlipidemia who presents with acute onset aphasia and altered mental status.  She also has had a headache on the right side since last night.  Initial CT head shows a small IPH in the posterior right temporal lobe.  Blood pressure was quickly controlled with labetalol and clevidipine, and patient will be admitted to the ICU for post ICH monitoring.  Plan stability scan in 6 hours.  Will need MRI without contrast only and MRA head instead of CT angiogram given creatinine clearance of 28.6 to determine etiology of bleed given the location is somewhat atypical for hypertensive hemorrhage.   Primary  Diagnosis:  Nontraumatic intracerebral hemorrhage in hemisphere, unspecified  Secondary Diagnosis: Cerebral edema  Recommendations  - Admit to ICU - Stability scan in 6 hours (may do MRI or CT if this cannot be done) or STAT with any neurological decline - Frequent neuro checks; q4min for 1 hour, then q1hour - No antiplatelets or anticoagulants due to ICH - SCD for DVT prophylaxis, pharmacological DVT ppx at 24 hours if ICH is stable - Blood pressure control with goal systolic 130 - 150, cleviprex and labetalol PRN - Stroke labs, HgbA1c, fasting lipid panel - MRI brain WITHOUT contrast due to low creatinine clearance - MRA head and carotid ultrasound - Risk factor modification - Echocardiogram - PT consult, OT consult, Speech consult. - Stroke team to follow  ______________________________________________________________________ Patient seen by NP with MD.  Signed,  Cortney E Bucky Cardinal, NP [facility]   I have seen and examined the patient. I have formulated the assessment and plan. 78 y.o. female with hx of pulmonary fibrosis and hyperlipidemia who presents with acute onset aphasia and altered mental status.  She also has had a headache on the right side since last night. Exam reveals left visual field cut, decreased level of alertness, confusion, poor orientation and some word finding deficits. CT head shows a small IPH in the posterior right temporal lobe.  Blood pressure was quickly controlled with labetalol and clevidipine. Admitting the patient to the ICU for post ICH monitoring.  Plan stability scan in 6 hours.   Electronically signed: Dr. Linell Shawn

## 2024-06-07 NOTE — Code Documentation (Addendum)
 Stroke Response Nurse Documentation Code Documentation  Alison Campbell is a 78 y.o. female arriving to [facility]  via [facility] on 06/07/24 with past medical hx of pulmonary fibrosis and HLD. On No antithrombotic. Code stroke was activated by EMS.   Patient from home where she was LKW at 1340 and now complaining of aphasia and hemianopia. .  Stroke team at the bedside on patient arrival. Labs drawn and patient cleared for CT by EDP. Patient to CT with team. NIHSS 4 see documentation for details and code stroke times. Patient with decreased LOC, left hemianopia, and Global aphasia  on exam. The following imaging was completed:  CT Head. Patient is not a candidate for IV Thrombolytic due to ICH . Patient is not a candidate for IR due to ICH.   Care Plan: q 1 NIHSS and BP and pupils                   BP 130-150                   Stability CT in 6 hrs.  Bedside handoff with ED RN complete.    Alison Campbell  Stroke Response RN

## 2024-06-07 NOTE — Telephone Encounter (Signed)
 Alison Campbell; Alison Campbell; Alison Campbell, Alison Campbell; Alison Campbell, [place] Looks like the scheduling team has tried to reach out and schedule but had to leave a message.Aaron Aas...will reach out and have them make another attempt

## 2024-06-07 NOTE — Plan of Care (Signed)
 MRI brain completed with excessive amounts of motion.  On GRE imaging, there is blooming artifact but the bleed looks stable.  Exam unchanged. Plan as in H&P from Dr. Lindzen. Stroke team to follow   -- Tona Francis, MD Neurologist [facility]

## 2024-06-07 NOTE — ED Provider Notes (Signed)
 [facility] AT [facility] Provider Note   CSN: [id number] Arrival date & time: 06/07/24  1514     History  Chief Complaint  Patient presents with   Code Stroke    Alison Campbell is a 78 y.o. female with PMH as listed below who presents with BIB [facility] from home with a LKW at 1340. Patient had R-sided headache that began last night and then acutely today began experiencing aphasia. Some difficulty naming objects/following commands.  When PT arrived to EMS, there are some slight bilateral motor and left sided visual deficits. Doesn't take blood thinners.   Code stroke called by EMS.   Past Medical History:  Diagnosis Date   Anxiety    Atherosclerosis    CKD (chronic kidney disease) stage 3, GFR 30-59 ml/min (HCC) 12/24/2020   Colon polyps    COPD (chronic obstructive pulmonary disease) (HCC)    pulmonary fibrosis   Costochondritis    Elevated blood pressure reading    no meds   GERD (gastroesophageal reflux disease)    coffee related   Hyperlipidemia    Insomnia    Osteoporosis    Pre-diabetes        Home Medications Prior to Admission medications   Medication Sig Start Date End Date Taking? Authorizing Provider  acetaminophen  (TYLENOL ) 500 MG tablet Take 1,000 mg by mouth every 8 (eight) hours as needed for mild pain (pain score 1-3) or moderate pain (pain score 4-6).    [provider]  Calcium  Carb-Cholecalciferol  (CALCIUM  600 + D PO) Take 2 tablets by mouth daily.    [provider]  cholecalciferol  (VITAMIN D3) 25 MCG (1000 UNIT) tablet Take 5,000 Units by mouth daily.    [provider]  ezetimibe  (ZETIA ) 10 MG tablet TAKE 1 TABLET BY MOUTH DAILY FOR CHOLESTEROL 12/11/21   Vangie Genet, MD  Ibuprofen  200 MG CAPS Take 800 mg by mouth 2 (two) times daily as needed.    [provider]  Multiple Vitamin (MULTIVITAMIN PO) Take 1 tablet by mouth daily.    [provider]  Pirfenidone  267  MG TABS Take 2 tablets (534 mg total) by mouth with breakfast, with lunch, and with evening meal. 03/24/24   Maire Scot, MD  Psyllium (METAMUCIL FIBER PO) Take 3 tablets by mouth daily.    [provider]  rosuvastatin  (CRESTOR ) 20 MG tablet Take 1/2 tablet (10 mg) 3 x / week for Cholesterol 05/11/23   Vangie Genet, MD  traZODone  (DESYREL ) 150 MG tablet Take 1/2 to 1 tablet 1 hour before Bedtime if needed for Sleep                                  /                                 TAKE                                               BY                            MOUTH 12/22/22   Vangie Genet, MD  Allergies    Fish oil and Spiriva  respimat [tiotropium bromide monohydrate ]    Review of Systems   Review of Systems A 10 point review of systems was performed and is negative unless otherwise reported in HPI.  Physical Exam Updated Vital Signs BP (!) 143/74 (BP Location: Right Arm)   Pulse 82   Temp 97.7 F (36.5 C) (Oral)   Resp 19   Ht 5' 3 (1.6 m)   Wt 62.8 kg   SpO2 95%   BMI 24.53 kg/m  Physical Exam General: Normal appearing female, lying in bed.  HEENT: PERRLA, EOMI, Sclera anicteric, MMM, trachea midline. Visual field deficit noted. Cardiology: RRR, no murmurs/rubs/gallops.  Resp: Normal respiratory rate and effort. CTAB, no wheezes, rhonchi, crackles.  Abd: Soft, non-tender, non-distended. No rebound tenderness or guarding.  GU: Deferred. MSK: No peripheral edema or signs of trauma. Extremities without deformity or TTP. No cyanosis or clubbing. Skin: warm, dry. No rashes or lesions. Back: No CVA tenderness Neuro: A&Ox3 (intermittent situational confusion and word-finding difficulty), CNs II-XII grossly intact. 5/5 strength all extremities. Sensation grossly intact. Tongue protrudes midline. Psych: Normal mood and affect.   1a  Level of consciousness: 1=not alert but arousable by minor stimulation to obey, answer or respond  1b. LOC questions:   0=Performs both tasks correctly  1c. LOC commands: 0=Performs both tasks correctly  2.  Best Gaze: 0=normal  3.  Visual: 2=Complete hemianopia  4. Facial Palsy: 0=Normal symmetric movement  5a.  Motor left arm: 0=No drift, limb holds 90 (or 45) degrees for full 10 seconds  5b.  Motor right arm: 0=No drift, limb holds 90 (or 45) degrees for full 10 seconds  6a. motor left leg: 0=No drift, limb holds 90 (or 45) degrees for full 10 seconds  6b  Motor right leg:  0=No drift, limb holds 90 (or 45) degrees for full 10 seconds  7. Limb Ataxia: 0=Absent  8.  Sensory: 0=Normal; no sensory loss  9. Best Language:  1=Mild to moderate aphasia; some obvious loss of fluency or facility of comprehension without significant limitation on ideas expressed or form of expression.  10. Dysarthria: 0=Normal  11. Extinction and Inattention: 0=No abnormality   Total:   4        ED Results / Procedures / Treatments   Labs (all labs ordered are listed, but only abnormal results are displayed) Labs Reviewed  COMPREHENSIVE METABOLIC PANEL WITH GFR - Abnormal; Notable for the following components:      Result Value   Potassium 3.4 (*)    CO2 21 (*)    Creatinine, Ser 1.16 (*)    Albumin 3.4 (*)    GFR, Estimated 48 (*)    All other components within normal limits  CBC WITH DIFFERENTIAL/PLATELET - Abnormal; Notable for the following components:   WBC 10.7 (*)    Neutro Abs 8.1 (*)    All other components within normal limits  URINALYSIS, ROUTINE W REFLEX MICROSCOPIC - Abnormal; Notable for the following components:   Hgb urine dipstick LARGE (*)    Bacteria, UA RARE (*)    All other components within normal limits  I-STAT CHEM 8, ED - Abnormal; Notable for the following components:   Potassium 3.3 (*)    Creatinine, Ser 1.30 (*)    Calcium , Ion 1.06 (*)    All other components within normal limits  MRSA NEXT GEN BY PCR, NASAL  PROTIME-INR  APTT  CBC  DIFFERENTIAL  ETHANOL  CBC  LIPID  PANEL   HEMOGLOBIN A1C  MAGNESIUM  CBC  HEPATIC FUNCTION PANEL  CBC  CBC  CBG MONITORING, ED  TROPONIN I (HIGH SENSITIVITY)  TROPONIN I (HIGH SENSITIVITY)    EKG EKG Interpretation Date/Time:  Tuesday June 07 2024 15:38:46 EDT Ventricular Rate:  69 PR Interval:  151 QRS Duration:  85 QT Interval:  427 QTC Calculation: 458 R Axis:   40  Text Interpretation: Sinus rhythm Atrial premature complex Confirmed by Gwenetta Lennert [phone]) on 06/08/2024 12:56:08 PM  Radiology CTH: 1. 2.5 x 2.3 x 1.8 cm (5.2 mL) acute parenchymal hemorrhage within the posterior right temporal lobe with mild surrounding edema. A brain MRI (with and without contrast) is recommended once the hematoma resolves to exclude an underlying lesion. A brain MRI would also be helpful in assessing for cerebral amyloid angiopathy. 2. Advanced chronic small vessel ischemic changes within the cerebral white matter. 3. 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 4. Generalized cerebral atrophy.  Procedures .Critical Care  Performed by: Merdis Stalling, MD Authorized by: Merdis Stalling, MD   Critical care provider statement:    Critical care time (minutes):  30   Critical care was necessary to treat or prevent imminent or life-threatening deterioration of the following conditions:  CNS failure or compromise   Critical care was time spent personally by me on the following activities:  Development of treatment plan with patient or surrogate, discussions with consultants, evaluation of patient's response to treatment, examination of patient, ordering and review of laboratory studies, ordering and review of radiographic studies, ordering and performing treatments and interventions, pulse oximetry, re-evaluation of patient's condition, review of old charts and obtaining history from patient or surrogate   Care discussed with: admitting provider       Medications Ordered in ED Medications  sodium chloride   flush (NS) 0.9 % injection 3 mL (has no administration in time range)    ED Course/ Medical Decision Making/ A&P                          Medical Decision Making Amount and/or Complexity of Data Reviewed Labs: ordered. Decision-making details documented in ED Course. Radiology: ordered. Decision-making details documented in ED Course.  Risk Prescription drug management. Decision regarding hospitalization.    This patient presents to the ED for concern of AMS/headache/word-finding difficulty, this involves an extensive number of treatment options, and is a complaint that carries with it a high risk of complications and morbidity.  I considered the following differential and admission for this acute, potentially life threatening condition.   MDM:    Given the acute onset of neurological symptoms, stroke is the most concerning etiology of these acute symptoms. The neuro exam is significant for visual field deficit and word-finding difficulty. Neurology team evaluating patient at bedside. Plan to obtain emergent CT brain.  LKN: 1340 Glucose: 94 AC: No BP: 155/73  CT head reveals an acute right temporal lobe hemorrhage.   Clinical Course as of 06/12/24 1331  Tue Jun 07, 2024  1604 CT HEAD CODE STROKE WO CONTRAST 1. 2.5 x 2.3 x 1.8 cm (5.2 mL) acute parenchymal hemorrhage within the posterior right temporal lobe with mild surrounding edema. A brain MRI (with and without contrast) is recommended once the hematoma resolves to exclude an underlying lesion. A brain MRI would also be helpful in assessing for cerebral amyloid angiopathy. 2. Advanced chronic small vessel ischemic changes within the cerebral white matter. 3. Chronic  lacunar infarct within the left thalamus, unchanged. 4. Generalized cerebral atrophy.   [HN]  1604 Comprehensive metabolic panel(!) Unremarkable in the context of this patient's presentation  [HN]  1605 CBC, coags, etoh wnl [HN]  1605 BP(!): 140/66 [HN]     Clinical Course User Index [HN] Merdis Stalling, MD   Blood pressure was quickly controlled with labetalol  and clevidipine , and patient will be admitted to the ICU for post ICH monitoring.  Plan stability scan in 6 hours.  Will need MRI without contrast only and MRA head instead of CT angiogram given creatinine clearance of 28.6 to determine etiology of bleed given the location is somewhat atypical for hypertensive hemorrhage.   Labs: I Ordered, and personally interpreted labs.  The pertinent results include:  those listed above  Imaging Studies ordered: I ordered imaging studies including CTH I independently visualized and interpreted imaging. I agree with the radiologist interpretation  Additional history obtained from chart reivew, EMS  Cardiac Monitoring: The patient was maintained on a cardiac monitor.  I personally viewed and interpreted the cardiac monitored which showed an underlying rhythm of: NSR  Reevaluation: After the interventions noted above, I reevaluated the patient and found that they have :stayed the same  Social Determinants of Health: Lives independently  Disposition:  Admitted to neuro  Co morbidities that complicate the patient evaluation  Past Medical History:  Diagnosis Date   Anxiety    Atherosclerosis    CKD (chronic kidney disease) stage 3, GFR 30-59 ml/min (HCC) 12/24/2020   Colon polyps    COPD (chronic obstructive pulmonary disease) (HCC)    pulmonary fibrosis   Costochondritis    Elevated blood pressure reading    no meds   GERD (gastroesophageal reflux disease)    coffee related   Hyperlipidemia    Insomnia    Osteoporosis    Pre-diabetes      Medicines Meds ordered this encounter  Medications   sodium chloride  flush (NS) 0.9 % injection 3 mL    I have reviewed the patients home medicines and have made adjustments as needed  Problem List / ED Course: Problem List Items Addressed This Visit   None                This note was created using dictation software, which may contain spelling or grammatical errors.    Merdis Stalling, MD 06/12/24 1340

## 2024-06-07 NOTE — Progress Notes (Signed)
 Admitted to [facility] [address] [facility]. Report given to [facility]. CTA cancelled. Pt will need MRI/MRA at 2130 as "stability scan".

## 2024-06-07 NOTE — ED Triage Notes (Signed)
 PT BIB [facility] from home with a LKW at 1340. PT started experiencing some aphasia and had a ongoing headache that started last night.When PT arrived to EMS, there are some slight bilateral motor and left sided visual deficits. PT is a+ox4.

## 2024-06-08 ENCOUNTER — Inpatient Hospital Stay (HOSPITAL_COMMUNITY)

## 2024-06-08 ENCOUNTER — Encounter (HOSPITAL_COMMUNITY)

## 2024-06-08 DIAGNOSIS — G928 Other toxic encephalopathy: Secondary | ICD-10-CM

## 2024-06-08 DIAGNOSIS — R569 Unspecified convulsions: Secondary | ICD-10-CM

## 2024-06-08 DIAGNOSIS — G936 Cerebral edema: Secondary | ICD-10-CM | POA: Diagnosis not present

## 2024-06-08 DIAGNOSIS — R29704 NIHSS score 4: Secondary | ICD-10-CM

## 2024-06-08 DIAGNOSIS — F17210 Nicotine dependence, cigarettes, uncomplicated: Secondary | ICD-10-CM

## 2024-06-08 DIAGNOSIS — E785 Hyperlipidemia, unspecified: Secondary | ICD-10-CM

## 2024-06-08 DIAGNOSIS — I611 Nontraumatic intracerebral hemorrhage in hemisphere, cortical: Secondary | ICD-10-CM | POA: Diagnosis not present

## 2024-06-08 DIAGNOSIS — I6389 Other cerebral infarction: Secondary | ICD-10-CM | POA: Diagnosis not present

## 2024-06-08 DIAGNOSIS — N183 Chronic kidney disease, stage 3 unspecified: Secondary | ICD-10-CM

## 2024-06-08 DIAGNOSIS — I619 Nontraumatic intracerebral hemorrhage, unspecified: Secondary | ICD-10-CM

## 2024-06-08 DIAGNOSIS — I129 Hypertensive chronic kidney disease with stage 1 through stage 4 chronic kidney disease, or unspecified chronic kidney disease: Secondary | ICD-10-CM

## 2024-06-08 LAB — COMPREHENSIVE METABOLIC PANEL WITH GFR
ALT: 19 U/L (ref 0–44)
AST: 17 U/L (ref 15–41)
Albumin: 3.4 g/dL — ABNORMAL LOW (ref 3.5–5.0)
Alkaline Phosphatase: 53 U/L (ref 38–126)
Anion gap: 8 (ref 5–15)
BUN: 18 mg/dL (ref 8–23)
CO2: 23 mmol/L (ref 22–32)
Calcium: 9.3 mg/dL (ref 8.9–10.3)
Chloride: 108 mmol/L (ref 98–111)
Creatinine, Ser: 1.03 mg/dL — ABNORMAL HIGH (ref 0.44–1.00)
GFR, Estimated: 55 mL/min — ABNORMAL LOW (ref >60)
Glucose, Bld: 100 mg/dL — ABNORMAL HIGH (ref 70–99)
Potassium: 3.5 mmol/L (ref 3.5–5.1)
Sodium: 139 mmol/L (ref 135–145)
Total Bilirubin: 0.4 mg/dL (ref 0.0–1.2)
Total Protein: 6.4 g/dL — ABNORMAL LOW (ref 6.5–8.1)

## 2024-06-08 LAB — ECHOCARDIOGRAM COMPLETE
AR max vel: 1.79 cm2
AV Area VTI: 1.86 cm2
AV Area mean vel: 1.79 cm2
AV Mean grad: 3 mmHg
AV Peak grad: 5.6 mmHg
Ao pk vel: 1.18 m/s
Area-P 1/2: 3.48 cm2
Height: 63 in
S' Lateral: 2.5 cm
Weight: 2215.18 [oz_av]

## 2024-06-08 LAB — CBC WITH DIFFERENTIAL/PLATELET
Abs Immature Granulocytes: 0.03 10*3/uL (ref 0.00–0.07)
Basophils Absolute: 0.1 10*3/uL (ref 0.0–0.1)
Basophils Relative: 1 %
Eosinophils Absolute: 0.2 10*3/uL (ref 0.0–0.5)
Eosinophils Relative: 2 %
HCT: 40.6 % (ref 36.0–46.0)
Hemoglobin: 13.3 g/dL (ref 12.0–15.0)
Immature Granulocytes: 0 %
Lymphocytes Relative: 15 %
Lymphs Abs: 1.6 10*3/uL (ref 0.7–4.0)
MCH: 30.2 pg (ref 26.0–34.0)
MCHC: 32.8 g/dL (ref 30.0–36.0)
MCV: 92.3 fL (ref 80.0–100.0)
Monocytes Absolute: 0.7 10*3/uL (ref 0.1–1.0)
Monocytes Relative: 6 %
Neutro Abs: 8.1 10*3/uL — ABNORMAL HIGH (ref 1.7–7.7)
Neutrophils Relative %: 76 %
Platelets: 268 10*3/uL (ref 150–400)
RBC: 4.4 MIL/uL (ref 3.87–5.11)
RDW: 13.2 % (ref 11.5–15.5)
WBC: 10.7 10*3/uL — ABNORMAL HIGH (ref 4.0–10.5)
nRBC: 0 % (ref 0.0–0.2)

## 2024-06-08 LAB — BASIC METABOLIC PANEL WITH GFR
Anion gap: 8 (ref 5–15)
BUN: 17 mg/dL (ref 8–23)
CO2: 23 mmol/L (ref 22–32)
Calcium: 9.4 mg/dL (ref 8.9–10.3)
Chloride: 108 mmol/L (ref 98–111)
Creatinine, Ser: 1.08 mg/dL — ABNORMAL HIGH (ref 0.44–1.00)
GFR, Estimated: 52 mL/min — ABNORMAL LOW (ref >60)
Glucose, Bld: 96 mg/dL (ref 70–99)
Potassium: 3.7 mmol/L (ref 3.5–5.1)
Sodium: 139 mmol/L (ref 135–145)

## 2024-06-08 LAB — HEMOGLOBIN A1C
Hgb A1c MFr Bld: 5.4 % (ref 4.8–5.6)
Mean Plasma Glucose: 108.28 mg/dL

## 2024-06-08 LAB — LIPID PANEL
Cholesterol: 136 mg/dL (ref 0–200)
HDL: 54 mg/dL (ref >40)
LDL Cholesterol: 53 mg/dL (ref 0–99)
Total CHOL/HDL Ratio: 2.5 ratio
Triglycerides: 144 mg/dL (ref <150)
VLDL: 29 mg/dL (ref 0–40)

## 2024-06-08 LAB — TROPONIN I (HIGH SENSITIVITY): Troponin I (High Sensitivity): 3 ng/L (ref <18)

## 2024-06-08 LAB — MAGNESIUM: Magnesium: 2 mg/dL (ref 1.7–2.4)

## 2024-06-08 MED ORDER — HALOPERIDOL LACTATE 5 MG/ML IJ SOLN
5.0000 mg | Freq: Once | INTRAMUSCULAR | Status: AC
  Administered 2024-06-08: 5 mg via INTRAMUSCULAR

## 2024-06-08 MED ORDER — SODIUM CHLORIDE 0.9 % IV BOLUS
500.0000 mL | Freq: Once | INTRAVENOUS | Status: AC
  Administered 2024-06-08: 500 mL via INTRAVENOUS

## 2024-06-08 MED ORDER — LABETALOL HCL 5 MG/ML IV SOLN
10.0000 mg | INTRAVENOUS | Status: DC | PRN
  Administered 2024-06-08: 20 mg via INTRAVENOUS
  Administered 2024-06-10 – 2024-06-11 (×3): 10 mg via INTRAVENOUS
  Filled 2024-06-08 (×4): qty 4

## 2024-06-08 MED ORDER — AMLODIPINE BESYLATE 10 MG PO TABS
10.0000 mg | ORAL_TABLET | Freq: Every day | ORAL | Status: DC
  Administered 2024-06-08: 10 mg via ORAL
  Filled 2024-06-08: qty 1

## 2024-06-08 MED ORDER — HALOPERIDOL LACTATE 5 MG/ML IJ SOLN
INTRAMUSCULAR | Status: AC
  Filled 2024-06-08: qty 1

## 2024-06-08 MED ORDER — LORAZEPAM 2 MG/ML IJ SOLN
INTRAMUSCULAR | Status: AC
  Filled 2024-06-08: qty 1

## 2024-06-08 MED ORDER — HEPARIN SODIUM (PORCINE) 5000 UNIT/ML IJ SOLN
5000.0000 [IU] | Freq: Three times a day (TID) | INTRAMUSCULAR | Status: DC
  Administered 2024-06-09 – 2024-06-14 (×14): 5000 [IU] via SUBCUTANEOUS
  Filled 2024-06-08 (×16): qty 1

## 2024-06-08 MED ORDER — LORAZEPAM 2 MG/ML IJ SOLN
2.0000 mg | Freq: Once | INTRAMUSCULAR | Status: AC
  Administered 2024-06-08: 2 mg via INTRAVENOUS

## 2024-06-08 MED ORDER — LABETALOL HCL 5 MG/ML IV SOLN
10.0000 mg | INTRAVENOUS | Status: DC | PRN
  Filled 2024-06-08: qty 4

## 2024-06-08 MED ORDER — PANTOPRAZOLE SODIUM 40 MG PO TBEC
40.0000 mg | DELAYED_RELEASE_TABLET | Freq: Every day | ORAL | Status: DC
  Administered 2024-06-09 – 2024-06-12 (×4): 40 mg via ORAL
  Filled 2024-06-08 (×5): qty 1

## 2024-06-08 MED ORDER — DEXMEDETOMIDINE HCL IN NACL 400 MCG/100ML IV SOLN
INTRAVENOUS | Status: AC
  Administered 2024-06-08: 0.4 ug/kg/h via INTRAVENOUS
  Filled 2024-06-08: qty 100

## 2024-06-08 MED ORDER — ACETAMINOPHEN 325 MG PO TABS
650.0000 mg | ORAL_TABLET | Freq: Four times a day (QID) | ORAL | Status: DC | PRN
  Administered 2024-06-09: 650 mg via ORAL
  Filled 2024-06-08: qty 2

## 2024-06-08 MED ORDER — ACETAMINOPHEN 650 MG RE SUPP: 650.0000 mg | RECTAL | Status: DC | PRN

## 2024-06-08 MED ORDER — LABETALOL HCL 5 MG/ML IV SOLN
10.0000 mg | INTRAVENOUS | Status: DC | PRN
  Administered 2024-06-08: 10 mg via INTRAVENOUS

## 2024-06-08 MED ORDER — BUTALBITAL-APAP-CAFFEINE 50-325-40 MG PO TABS
1.0000 | ORAL_TABLET | Freq: Three times a day (TID) | ORAL | Status: DC | PRN
  Administered 2024-06-08 – 2024-06-09 (×2): 1 via ORAL
  Filled 2024-06-08 (×2): qty 1

## 2024-06-08 MED ORDER — SODIUM CHLORIDE 0.9 % IV SOLN: INTRAVENOUS | Status: DC

## 2024-06-08 MED ORDER — IOHEXOL 350 MG/ML SOLN
75.0000 mL | Freq: Once | INTRAVENOUS | Status: AC | PRN
  Administered 2024-06-08: 75 mL via INTRAVENOUS

## 2024-06-08 MED ORDER — ACETAMINOPHEN 160 MG/5ML PO SOLN: 650.0000 mg | ORAL | Status: DC | PRN

## 2024-06-08 MED ORDER — MIDAZOLAM HCL 2 MG/2ML IJ SOLN
4.0000 mg | Freq: Once | INTRAMUSCULAR | Status: AC
  Administered 2024-06-08: 4 mg via INTRAMUSCULAR
  Filled 2024-06-08: qty 4

## 2024-06-08 MED ORDER — DEXMEDETOMIDINE HCL IN NACL 400 MCG/100ML IV SOLN
0.0000 ug/kg/h | INTRAVENOUS | Status: DC
  Administered 2024-06-09: 0.5 ug/kg/h via INTRAVENOUS
  Filled 2024-06-08: qty 100

## 2024-06-08 NOTE — Progress Notes (Addendum)
 STROKE TEAM PROGRESS NOTE    SIGNIFICANT HOSPITAL EVENTS  6/10: presented w. Aphasia, headache  CTH: Acute R temporal ICH  INTERIM HISTORY/SUBJECTIVE  Overnight MRI showed excessive amount of motion, but bleed looed stable on neurologist review, exam unchanged overnight. Cleviprex  off this morning This morning's exam: Patient continues to complain of headache on right side.  She is alert and oriented.  She follows all simple commands.  Inconsistent left visual field testing.  Inconsistent sensation/extinction testing on lower extremities.  Pending CT Angio and CTV today. If neuro exam and imaging remains stable and cleviprex  remains off, can likely transfer out of ICU late afternoon.   Patient is in a research study with pulmonary fibrosis medications ([place] trial) Pirfenidone . No bleeding side effects known, okay for patient to continue. Family will need to bring this in so patient can continue. Our specialty pharmacy is not managing this trial.    OBJECTIVE  CBC    Component Value Date/Time   WBC 9.8 06/07/2024 2324   RBC 4.41 06/07/2024 2324   HGB 13.5 06/07/2024 2324   HCT 41.0 06/07/2024 2324   PLT 259 06/07/2024 2324   MCV 93.0 06/07/2024 2324   MCH 30.6 06/07/2024 2324   MCHC 32.9 06/07/2024 2324   RDW 13.2 06/07/2024 2324   LYMPHSABS 2.0 06/07/2024 1516   MONOABS 0.8 06/07/2024 1516   EOSABS 0.3 06/07/2024 1516   BASOSABS 0.1 06/07/2024 1516    BMET    Component Value Date/Time   NA 139 06/07/2024 2324   K 3.5 06/07/2024 2324   CL 108 06/07/2024 2324   CO2 23 06/07/2024 2324   GLUCOSE 100 (H) 06/07/2024 2324   BUN 18 06/07/2024 2324   CREATININE 1.03 (H) 06/07/2024 2324   CREATININE 1.03 (H) 10/22/2023 1114   CALCIUM  9.3 06/07/2024 2324   EGFR 55 (L) 10/22/2023 1114   GFRNONAA 55 (L) 06/07/2024 2324   GFRNONAA 48 (L) 06/18/2020 0959    IMAGING past 24 hours MR ANGIO HEAD WO CONTRAST Result Date: 06/07/2024 CLINICAL DATA:  Follow-up examination  for hemorrhagic stroke. EXAM: MRI HEAD WITHOUT CONTRAST MRA HEAD WITHOUT CONTRAST TECHNIQUE: Multiplanar, multi-echo pulse sequences of the brain and surrounding structures were acquired without intravenous contrast. Angiographic images of the Circle of Willis were acquired using MRA technique without intravenous contrast. COMPARISON:  CT from earlier the same day FINDINGS: MRI HEAD FINDINGS Brain: Examination severely limited as the patient was unable to tolerate the full length of the exam. Additionally, provided images are markedly limited by positioning and motion. Age-related cerebral atrophy with moderate to advanced chronic microvascular ischemic disease. Previously identified intraparenchymal hemorrhage involving the right temporal lobe again seen, grossly stable in size from prior. Surrounding edema without significant regional mass effect. No visible underlying lesion, although evaluation fairly limited on this exam. No other visible acute or subacute ischemic changes. No other visible acute or chronic intracranial blood products. No mass lesion or midline shift. No hydrocephalus or extra-axial fluid collection. Vascular: Not well assessed on this limited exam. Skull and upper cervical spine: Not well assessed on this limited exam. Sinuses/Orbits: Not well assessed on this limited exam. Other: None. MRA HEAD FINDINGS Anterior circulation: Examination severely degraded by motion artifact, limiting assessment. Both internal carotid arteries are patent through the siphons without visible stenosis or other abnormality. A1 segments, anterior communicating artery complex common anterior cerebral arteries grossly patent without visible stenosis. M1 segments patent without visible stenosis. No proximal MCA branch occlusion. Distal MCA branches grossly perfused and  symmetric. Posterior circulation: Left vertebral artery dominant and patent without visible stenosis. Left PICA not well seen. Diminutive right  vertebral artery appears to largely terminate in PICA. Right PICA grossly patent at its origin. Basilar patent without visible stenosis. Superior cerebral arteries patent bilaterally. Predominant fetal type origin of the PCAs bilaterally. Both PCAs patent to their distal aspects without visible stenosis. Anatomic variants: As above. No visible vascular abnormality seen underlying the right temporal bleed. IMPRESSION: MRI HEAD: 1. Severely limited motion degraded and truncated exam. 2. Grossly stable size and appearance of intraparenchymal hemorrhage involving the right temporal lobe. Surrounding edema without significant regional mass effect. 3. No other definite acute intracranial abnormality. 4. Age-related cerebral atrophy with moderate to advanced chronic microvascular ischemic disease. MRA HEAD: 1. Severely limited exam due to motion artifact. 2. Grossly negative intracranial MRA. No large vessel occlusion or other emergent finding. No hemodynamically significant or correctable stenosis. Electronically Signed   By: Virgia Griffins M.D.   On: 06/07/2024 22:52   MR BRAIN WO CONTRAST Result Date: 06/07/2024 CLINICAL DATA:  Follow-up examination for hemorrhagic stroke. EXAM: MRI HEAD WITHOUT CONTRAST MRA HEAD WITHOUT CONTRAST TECHNIQUE: Multiplanar, multi-echo pulse sequences of the brain and surrounding structures were acquired without intravenous contrast. Angiographic images of the Circle of Willis were acquired using MRA technique without intravenous contrast. COMPARISON:  CT from earlier the same day FINDINGS: MRI HEAD FINDINGS Brain: Examination severely limited as the patient was unable to tolerate the full length of the exam. Additionally, provided images are markedly limited by positioning and motion. Age-related cerebral atrophy with moderate to advanced chronic microvascular ischemic disease. Previously identified intraparenchymal hemorrhage involving the right temporal lobe again seen, grossly  stable in size from prior. Surrounding edema without significant regional mass effect. No visible underlying lesion, although evaluation fairly limited on this exam. No other visible acute or subacute ischemic changes. No other visible acute or chronic intracranial blood products. No mass lesion or midline shift. No hydrocephalus or extra-axial fluid collection. Vascular: Not well assessed on this limited exam. Skull and upper cervical spine: Not well assessed on this limited exam. Sinuses/Orbits: Not well assessed on this limited exam. Other: None. MRA HEAD FINDINGS Anterior circulation: Examination severely degraded by motion artifact, limiting assessment. Both internal carotid arteries are patent through the siphons without visible stenosis or other abnormality. A1 segments, anterior communicating artery complex common anterior cerebral arteries grossly patent without visible stenosis. M1 segments patent without visible stenosis. No proximal MCA branch occlusion. Distal MCA branches grossly perfused and symmetric. Posterior circulation: Left vertebral artery dominant and patent without visible stenosis. Left PICA not well seen. Diminutive right vertebral artery appears to largely terminate in PICA. Right PICA grossly patent at its origin. Basilar patent without visible stenosis. Superior cerebral arteries patent bilaterally. Predominant fetal type origin of the PCAs bilaterally. Both PCAs patent to their distal aspects without visible stenosis. Anatomic variants: As above. No visible vascular abnormality seen underlying the right temporal bleed. IMPRESSION: MRI HEAD: 1. Severely limited motion degraded and truncated exam. 2. Grossly stable size and appearance of intraparenchymal hemorrhage involving the right temporal lobe. Surrounding edema without significant regional mass effect. 3. No other definite acute intracranial abnormality. 4. Age-related cerebral atrophy with moderate to advanced chronic microvascular  ischemic disease. MRA HEAD: 1. Severely limited exam due to motion artifact. 2. Grossly negative intracranial MRA. No large vessel occlusion or other emergent finding. No hemodynamically significant or correctable stenosis. Electronically Signed   By: Elenor Griffith.D.  On: 06/07/2024 22:52   CT HEAD CODE STROKE WO CONTRAST Addendum Date: 06/07/2024 ADDENDUM REPORT: 06/07/2024 16:13 ADDENDUM: 2.5 x 2.3 x 1.8 cm (5.2 mL) acute parenchymal hemorrhage within the posterior right temporal lobe with mild surrounding edema. These results were called by telephone on 06/07/2024 at 3:54 pm to provider Dr. Lindzen, who verbally acknowledged these results. Electronically Signed   By: Bascom Lily D.O.   On: 06/07/2024 16:13   Result Date: 06/07/2024 CLINICAL DATA:  Code stroke. Provided history: Neuro deficit, acute, stroke suspected. EXAM: CT HEAD WITHOUT CONTRAST TECHNIQUE: Contiguous axial images were obtained from the base of the skull through the vertex without intravenous contrast. RADIATION DOSE REDUCTION: This exam was performed according to the departmental dose-optimization program which includes automated exposure control, adjustment of the mA and/or kV according to patient size and/or use of iterative reconstruction technique. COMPARISON:  Head CT 10/26/2023. FINDINGS: Brain: Generalized cerebral atrophy. 2.5 x 2.3 x 1.8 cm (5.2 mL) acute parenchymal hemorrhage within the posterior right temporal lobe with mild surrounding edema. Patchy and ill-defined hypoattenuation elsewhere within the cerebral white matter, nonspecific but compatible with advanced chronic small vessel ischemic disease. Chronic lacunar infarct again demonstrated within the dorsal left thalamus. No extra-axial fluid collection. No midline shift. Vascular: No hyperdense vessel. Atherosclerotic calcifications. Skull: No calvarial fracture or aggressive osseous lesion. Sinuses/Orbits: No mass or acute finding within the imaged orbits. No  significant paranasal sinus disease at the imaged levels. Other: Small-volume fluid within the mastoid air cells. ASPECTS ([place]ber[place] Stroke Program Early CT Score) - Ganglionic level infarction (caudate, lentiform nuclei, internal capsule, insula, M1-M3 cortex): 7 - Supraganglionic infarction (M4-M6 cortex): 3 Total score (0-10 with 10 being normal): 10 Attempts are being made to reach the ordering provider at this time. IMPRESSION: 1. 2.5 x 2.3 x 1.8 cm (5.2 mL) acute parenchymal hemorrhage within the posterior right temporal lobe with mild surrounding edema. A brain MRI (with and without contrast) is recommended once the hematoma resolves to exclude an underlying lesion. A brain MRI would also be helpful in assessing for cerebral amyloid angiopathy. 2. Advanced chronic small vessel ischemic changes within the cerebral white matter. 3. 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 4. Generalized cerebral atrophy. Electronically Signed: By: Bascom Lily D.O. On: 06/07/2024 15:45    Vitals:   06/08/24 0720 06/08/24 0730 06/08/24 0731 06/08/24 0745  BP: (!) 130/55 (!) 129/53 129/60 134/62  Pulse:      Resp: 11 18 13 18   Temp:      TempSrc:      SpO2:      Weight:      Height:         PHYSICAL EXAM General:  Alert, well-nourished, well-developed patient in no acute distress Psych:  Mood and affect appropriate for situation CV: Regular rate and rhythm on monitor Respiratory:  Regular, unlabored respirations on room air GI: Abdomen soft and nontender   NEURO:  Mental Status: Awake and alert, oriented to age, place, month/year. Speech/Language: No dysarthria or aphasia.  Follows all simple commands she is able to name objects and repeat sentences. Cranial Nerves:  II: PERRL.  VFF on right, inconsistent left visual field testing III, IV, VI: EOMI. no gaze palsy, no nystagmus. V: Sensation is intact to light touch and symmetrical to face.  VII: Face is symmetrical resting and  smiling VIII: hearing intact to voice. IX, X: Palate elevates symmetrically. Phonation is normal.  ZO:[id number] shrug 5/5. XII: tongue protrusion midline Motor: 5/5  strength to all muscle groups tested.  Tone: is normal and bulk is normal Sensation-inconsistent sensation and extinction testing on lower extremities. Coordination: Slight dysmetria left FTN testing Gait- deferred  Most Recent NIH: 4.    ASSESSMENT/PLAN  [name] is a 78 y.o. female with history of HLD, pulmonary fibrosis who presented as a code stroke 6/10 due to acute onset of aphasia and altered mental status, she also complained of a headache on the right side since 6/9 PM.  CT head showed right temporal IPH and she was admitted for full stroke workup.  NIH on Admission: 4..  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 Surrounding edema without significant regional mass effect. CT repeat stable hematoma CT head and neck bilateral ICA bulb atherosclerosis, no AVM or aneurysm CTV no dural venous sinus thrombosis 2D Echo PENDING LDL 53 HgbA1c 5.6 VTE prophylaxis - SCDs No antithrombotic prior to admission, continue No antithrombotic due to ICH. Therapy recommendations:  Pending Disposition:  pending   Hypertension Home meds:  none Unstable Labetalol  PRN Q2H off Cleviprex  gtt now BP goal less than 160 Long-term BP goal normotensive  Hyperlipidemia Home meds:  Zetia  10mg  and Crestor  20mg  (3 times a week) LDL 53, goal < 70 Hold off statin for now Will discuss with pt and family about [place] trial  Tobacco Abuse Patient smokes a pack per day for >50  years      Ready to quit? No Nicotine  replacement therapy provided Continue cessation education  Other Active Problems COPD CKD stage 3, creatinine 1.16--1.03--1.08 Anxiety Pulmonary Fibrosis, currently enrolled in [place] trial, Ok to continue  pirfenidone , nonformulary, family will need to bring in   Hospital day # 1   Pt seen by Neuro NP/APP with MD. Note/plan to be edited by MD as needed.    Alison Lease, DNP, AGACNP-BC [facility] Please use AMION for contact information & EPIC for messaging.  ATTENDING NOTE: I reviewed above note and agree with the assessment and plan. Pt was seen and examined.   No family at bedside.  Pt lying in bed, still has HA on the right side. But awake, alert, eyes open, orientated to age, place, time. No aphasia, fluent language, following all simple commands. Able to name and repeat. No gaze palsy, tracking bilaterally, visual field full on the right but inconsistent on the left visual field, PERRL. No facial droop. Tongue midline. Bilateral UEs 5/5, no drift. Bilaterally LEs 5/5, no drift. Sensation symmetrical bilaterally but seems to have left sensory simultagnosis, R FTN intact L FTN slight dysmetria, gait not tested.   Patient right temporal some ICH stable on repeat CT scan.  Etiology unclear, no AVM or aneurysm, no venous sinus thrombosis.  Will repeat MRI with and without contrast to rule out malignancy.  BP goal less than 160, off Cleviprex , close monitoring.  Will discuss with patient and family regarding [place] trial.  For detailed assessment and plan, please refer to above as I have made changes whNo family at the bedside.   Consuelo Denmark, MD PhD Stroke Neurology 06/08/2024 5:52 PM  This patient is critically ill due to ICH, hypertensive emergency, pulmonary fibrosis and at significant risk of  neurological worsening, death form hematoma expansion, brain herniation, hypertensive encephalopathy, respiratory failure. This  patient's care requires constant monitoring of vital signs, hemodynamics, respiratory and cardiac monitoring, review of multiple databases, neurological assessment, discussion with family, other specialists and medical decision making of high complexity. I spent 35 minutes of neurocritical care time in the care of this patient.    To contact Stroke Continuity provider, please refer to [email or web address]. After hours, contact General Neurology

## 2024-06-08 NOTE — Evaluation (Signed)
 Physical Therapy Evaluation Patient Details Name: Alison Campbell MRN: [id number] DOB: 04-11-1946 Today's Date: 06/08/2024  History of Present Illness  Pt is an 78 y.o. female presenting 6/10 with acute onset aphasia. CTH reveals acute R temporal lobe hemorrhage. PMH significant for pulmonary fibrosis and HLD.  Clinical Impression  PTA, pt lives with her son and is independent with mobility, ADL's/IADL's. Pt son is a long haul truck driver and only available intermittently; pt daughter also works. Pt presents with left sided sensory deficits, impaired standing balance and cognition. Pt requiring up to min assist for ambulating unit and demonstrates difficulty with dual tasking. Mod cues provided for locating objects in hall, particularly on left side, and finding room. Recommend post acute rehab in light of deficits and decreased caregiver support.      If plan is discharge home, recommend the following: A little help with walking and/or transfers;Assistance with cooking/housework;Assist for transportation;Help with stairs or ramp for entrance   Can travel by private vehicle        Equipment Recommendations None recommended by PT  Recommendations for Other Services  Rehab consult    Functional Status Assessment Patient has had a recent decline in their functional status and demonstrates the ability to make significant improvements in function in a reasonable and predictable amount of time.     Precautions / Restrictions Precautions Precautions: Fall Restrictions Weight Bearing Restrictions Per Provider Order: No      Mobility  Bed Mobility Overal bed mobility: Needs Assistance Bed Mobility: Supine to Sit, Sit to Supine     Supine to sit: Supervision Sit to supine: Supervision        Transfers Overall transfer level: Needs assistance Equipment used: None Transfers: Sit to/from Stand Sit to Stand: Contact guard assist           General transfer comment: CGA for  safety    Ambulation/Gait Ambulation/Gait assistance: Contact guard assist, Min assist Gait Distance (Feet): 250 Feet Assistive device: None Gait Pattern/deviations: Step-through pattern, Decreased stride length Gait velocity: decreased     General Gait Details: Pt requiring up to min assist for dynamic balance and environmental navigation. Difficulty with dual tasking, has to stop to visually scan or locate objects in hallway  Stairs            Wheelchair Mobility     Tilt Bed    Modified Rankin (Stroke Patients Only) Modified Rankin (Stroke Patients Only) Pre-Morbid Rankin Score: No symptoms Modified Rankin: Moderately severe disability     Balance Overall balance assessment: Needs assistance Sitting-balance support: Feet supported Sitting balance-Leahy Scale: Good     Standing balance support: No upper extremity supported, During functional activity Standing balance-Leahy Scale: Fair                               Pertinent Vitals/Pain Pain Assessment Pain Assessment: Faces Faces Pain Scale: No hurt    Home Living Family/patient expects to be discharged to:: Private residence Living Arrangements: Alone Available Help at Discharge: Family;Available PRN/intermittently Type of Home: House Home Access: Stairs to enter Entrance Stairs-Rails: Right;Left;Can reach both Entrance Stairs-Number of Steps: 4   Home Layout: One level Home Equipment: Agricultural consultant (2 wheels);Cane - single point Additional Comments: Son is long Research officer, trade union Prior Level of Function : Independent/Modified Independent             Mobility Comments: ind with mobility ADLs  Comments: independent ADL's/IADL's, drives to [place] every Sunday for church, does grocery shopping. very independent     Extremity/Trunk Assessment   Upper Extremity Assessment Upper Extremity Assessment: Defer to OT evaluation    Lower Extremity Assessment Lower  Extremity Assessment: LLE deficits/detail LLE Deficits / Details: Extinction to bilateral stimulus       Communication   Communication Communication: No apparent difficulties    Cognition Arousal: Alert Behavior During Therapy: Flat affect, Impulsive   PT - Cognitive impairments: Awareness, Memory, Attention, Sequencing, Problem solving, Safety/Judgement                       PT - Cognition Comments: Pt daughter reports pt with mild memory decline over past year, is fiercely independent and does not like to pay attention to rules. Pt with decreased awareness of deficits and questions pertinence of tests, but overall will still participate. Following commands: Impaired Following commands impaired: Follows multi-step commands inconsistently, Only follows one step commands consistently     Cueing Cueing Techniques: Verbal cues     General Comments      Exercises     Assessment/Plan    PT Assessment Patient needs continued PT services  PT Problem List Decreased balance;Decreased mobility;Decreased cognition;Decreased safety awareness       PT Treatment Interventions Gait training;Stair training;Functional mobility training;Therapeutic activities;Therapeutic exercise;Balance training;Patient/family education;Cognitive remediation    PT Goals (Current goals can be found in the Care Plan section)  Acute Rehab PT Goals Patient Stated Goal: did not state PT Goal Formulation: With patient Time For Goal Achievement: 06/22/24 Potential to Achieve Goals: Good    Frequency Min 2X/week     Co-evaluation               AM-PAC PT 6 Clicks Mobility  Outcome Measure Help needed turning from your back to your side while in a flat bed without using bedrails?: None Help needed moving from lying on your back to sitting on the side of a flat bed without using bedrails?: A Little Help needed moving to and from a bed to a chair (including a wheelchair)?: A Little Help  needed standing up from a chair using your arms (e.g., wheelchair or bedside chair)?: A Little Help needed to walk in hospital room?: A Little Help needed climbing 3-5 steps with a railing? : A Little 6 Click Score: 19    End of Session Equipment Utilized During Treatment: Gait belt Activity Tolerance: Patient tolerated treatment well Patient left: in bed;with call bell/phone within reach;Other (comment) (going to CT) Nurse Communication: Mobility status PT Visit Diagnosis: Unsteadiness on feet (R26.81)    Time: 1410-1430 PT Time Calculation (min) (ACUTE ONLY): 20 min   Charges:   PT Evaluation $PT Eval Moderate Complexity: 1 Mod   PT General Charges $$ ACUTE PT VISIT: 1 Visit         Verdia Glad, PT, DPT Acute Rehabilitation Services Office [phone]   Claria Crofts 06/08/2024, 4:14 PM

## 2024-06-08 NOTE — Procedures (Signed)
 Patient Name: Fina Heizer  MRN: [id number]  Epilepsy Attending: Arleene Lack  Referring Physician/Provider: Consuelo Denmark, MD  Date: 06/08/2024 Duration: 24.03 mins  Patient history: 78yo F with right temporal ICH. EEG to evaluate for seizure.  Level of alertness: comatose/ lethargic   AEDs during EEG study: Ativan, Versed   Technical aspects: This EEG study was done with scalp electrodes positioned according to the 10-20 International system of electrode placement. Electrical activity was reviewed with band pass filter of 1-70Hz , sensitivity of 7 uV/mm, display speed of 80mm/sec with a 60Hz  notched filter applied as appropriate. EEG data were recorded continuously and digitally stored.  Video monitoring was available and reviewed as appropriate.  Description: EEG showed continuous generalized 3 to 6 Hz theta-delta slowing admixed with 15 to 18 Hz beta activity distributed symmetrically and diffusely. Hyperventilation and photic stimulation were not performed.     ABNORMALITY - Continuous slow, generalized  IMPRESSION: This study is suggestive of moderate to severe diffuse encephalopathy. No seizures or epileptiform discharges were seen throughout the recording.  Lyndall Bellot O Suzette Flagler

## 2024-06-08 NOTE — Progress Notes (Signed)
EEG complete. Results pending.  ?

## 2024-06-08 NOTE — Plan of Care (Addendum)
 Stat CT ordered as she is more somnolent. Was on precedex , which has been held for  Will follow.   Tona Francis, MD  ------------------------------------------------- Addendum Patient back from CT.  CT reviewed-no significant interval change. On exam also now more awake and strong in all 4 extremities. Will need Precedex  again Bedside RN will attempt to optimize Precedex .  Tona Francis, MD Neurology

## 2024-06-08 NOTE — Progress Notes (Signed)
   06/08/24 1500  Spiritual Encounters  Type of Visit Initial  Care provided to: Pt and family  Conversation partners present during encounter Nurse  Referral source Nurse (RN/NT/LPN)  OnCall Visit No    Chaplain responded to consult request for prayer and ACD. At the beginning pt was not in the room. Her daughter was sitting on the chair. She stated that they already have Advance Care Directives document and notarized it. No need to change or redo it.   While speaking with the daughter, pt came from CT scan. Her daughter noticed that her mother seemed confused and couldn't quite remember things clearly. RNs and the daughter were trying to let her remember the time, day, year etc.   The daughter said that it is better to get a chaplain service another time. Chaplain will make a follow up visit.  M.Kubra Welby Hale Resident [phone]

## 2024-06-08 NOTE — Progress Notes (Signed)
 PT Cancellation Note  Patient Details Name: Alison Campbell MRN: [id number] DOB: [birth year]   Cancelled Treatment:    Reason Eval/Treat Not Completed: Active bedrest order  Verdia Glad, PT, DPT Acute Rehabilitation Services Office [phone]    Claria Crofts 06/08/2024, 7:41 AM

## 2024-06-08 NOTE — Progress Notes (Signed)
 OT Cancellation Note  Patient Details Name: Averie Hornbaker MRN: [id number] DOB: 1946/05/09   Cancelled Treatment:    Reason Eval/Treat Not Completed: Patient at procedure or test/ unavailable   Karilyn Ouch, OTR/L [facility] Acute Rehabilitation Office: [phone]  Emery Hans 06/08/2024, 3:14 PM

## 2024-06-08 NOTE — Evaluation (Signed)
 Speech Language Pathology Evaluation Patient Details Name: Alison Campbell MRN: [id number] DOB: 06-20-1946 Today's Date: 06/08/2024 Time: 7829-5621 SLP Time Calculation (min) (ACUTE ONLY): 16 min  Problem List:  Patient Active Problem List   Diagnosis Date Noted   ICH (intracerebral hemorrhage) (HCC) 06/07/2024   Right patella fracture 10/30/2023   ILD (interstitial lung disease) (HCC) 08/15/2022   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/14/2022   BMI 25.0-25.9,adult 12/24/2020   CKD (chronic kidney disease) stage 3, GFR 30-59 ml/min (HCC) 12/24/2020   Insomnia    Dyshidrotic foot dermatitis    Aortic atherosclerosis (HCC) by CT Lung Scan in July 2021 03/11/2018   Tobacco use disorder 06/21/2015   Osteoporosis 06/21/2015   Essential hypertension 11/07/2013   Hyperlipidemia, mixed 11/07/2013   Abnormal glucose 11/07/2013   Chronic bronchitis (HCC) 11/07/2013   Vitamin D  deficiency 11/07/2013   Diverticulosis of large intestine 09/10/2011   History of colon polyps 09/10/2011   Past Medical History:  Past Medical History:  Diagnosis Date   Anxiety    Atherosclerosis    CKD (chronic kidney disease) stage 3, GFR 30-59 ml/min (HCC) 12/24/2020   Colon polyps    COPD (chronic obstructive pulmonary disease) (HCC)    pulmonary fibrosis   Costochondritis    Elevated blood pressure reading    no meds   GERD (gastroesophageal reflux disease)    coffee related   Hyperlipidemia    Insomnia    Osteoporosis    Pre-diabetes    Past Surgical History:  Past Surgical History:  Procedure Laterality Date   ABDOMINAL HYSTERECTOMY     COLONOSCOPY  09/10/2011   hysterectomy     INTERCOSTAL NERVE BLOCK Right 08/15/2022   Procedure: INTERCOSTAL NERVE BLOCK;  Surgeon: Zelphia Higashi, MD;  Location[facility] OR;  Service: Thoracic;  Laterality: Right;   LUNG BIOPSY Right 08/15/2022   Procedure: LUNG BIOPSY;  Surgeon: Zelphia Higashi, MD;  Location: [facility] OR;  Service: Thoracic;  Laterality: Right;   ORIF  PATELLA Right 10/30/2023   Procedure: OPEN REDUCTION INTERNAL FIXATION (ORIF) PATELLA;  Surgeon: Winston Hawking, MD;  Location: [facility] ORS;  Service: Orthopedics;  Laterality: Right;  90   POLYPECTOMY     UVULECTOMY N/A 09/09/2016   Procedure: PARTIAL UVULECTOMY, direct laryngoscopy;  Surgeon: Prescott Brodie, MD;  Location: [facility];  Service: ENT;  Laterality: N/A;  PARTIAL UVULECTOMY, direct laryngoscopy   HPI:  Patient is an 78 y.o. female with PMH: pulmonary fibrosis, HLD. She presented to the hospital on 06/07/24 with acute onset of aphasia and patient reporting a right sided headache which started the day prior to admission. At baseline, patient is independent with ADL's and still drives. CT head revealed acute right temporal lobe hemorrhage. MRI was motion degraded but showed  intraparenchymal hemorrhage involving the right temporal lobe. Surrounding edema without  significant regional mass effect.   Assessment / Plan / Recommendation Clinical Impression  Patient presents with a cognitive-linguistic impairment as per this evaluation. Of note, evaluation was limited by patient's significant c/o pain when BP cuff would squeeze her arm, which occured several times during evaluation. When BP cuff was squeezing her arm, she would yell out and would be unable to focus on anthing else. Twice she removed it and she would not listen to RN instructions to try to hold arm still. Patient was oriented to time, place and basic situation. She exhibited word finding difficulty when trying to describe her daughter's work history as well as when describing Education administrator  picture scene and would identify the people and some objects but did not use any verbs or adjectives: there's a stool, some shoes, a boy, a girl. She would not elaborate unless directly asked when SLP asking her about biographical information and about her typical daily activities. No speech or voice impairment observed. SLP plans  to further evaluate cogntition and higher level language abilities during a future visit. She may benefit from skilled SLP intervention at next venue of care pending progress here at acute care level.    SLP Assessment  SLP Recommendation/Assessment: Patient needs continued Speech Language Pathology Services SLP Visit Diagnosis: Cognitive communication deficit (R41.841)     Assistance Recommended at Discharge  Intermittent Supervision/Assistance  Functional Status Assessment Patient has had a recent decline in their functional status and demonstrates the ability to make significant improvements in function in a reasonable and predictable amount of time.  Frequency and Duration min 2x/week  1 week      SLP Evaluation Cognition  Overall Cognitive Status: Impaired/Different from baseline Arousal/Alertness: Awake/alert Orientation Level: Oriented to place;Oriented to time;Oriented to situation;Oriented to person Year: 2025 Month: June Day of Week: Incorrect Attention: Sustained Sustained Attention: Impaired Sustained Attention Impairment: Verbal basic;Functional basic Awareness: Impaired Awareness Impairment: Intellectual impairment Behaviors: Restless Safety/Judgment: Impaired       Comprehension  Auditory Comprehension Overall Auditory Comprehension: Appears within functional limits for tasks assessed    Expression Expression Primary Mode of Expression: Verbal Verbal Expression Overall Verbal Expression: Impaired Initiation: No impairment Naming: Impairment Other Naming Comments: word finding difficulty during unstructured conversation, gave limited description of picture scene (Cookie theft) Pragmatics: Impairment Impairments: Abnormal affect Interfering Components: Attention;Other (comment) (c/o pain with BP cuff) Non-Verbal Means of Communication: Not applicable   Oral / Motor  Oral Motor/Sensory Function Overall Oral Motor/Sensory Function: Within functional  limits Motor Speech Overall Motor Speech: Appears within functional limits for tasks assessed Respiration: Within functional limits Resonance: Within functional limits Articulation: Within functional limitis Intelligibility: Intelligible Motor Planning: Within functional limits            Jacqualine Mater, MA, CCC-SLP Speech Therapy

## 2024-06-08 NOTE — Progress Notes (Signed)
*  PRELIMINARY RESULTS* Echocardiogram 2D Echocardiogram has been performed.  Glenna Lango 06/08/2024, 2:17 PM

## 2024-06-08 NOTE — Telephone Encounter (Signed)
 Alison Campbell; Alison Campbell, [facility] office says I have reached out twice and left messages. I also called the daughter's phone this morning and left a message as well.   I let them know it looks like patient is in [place] currently.         Pt was in hospital yesterday closing encounter for now

## 2024-06-08 NOTE — Progress Notes (Addendum)
 Patient had CT, CT head and neck and CTV this afternoon.  Coming back from CT, patient started to be agitated, combative, restless, shouting and cursing.  CT reviewed personally, stable hematoma without extension or other acute abnormality.  CT head and neck no LVO or AVM or aneurysm, CTV no venous sinus thrombosis.  Concerning for contrast induced encephalopathy.  Patient has no IV access, give IM Haldol and IM Versed , patient became less restless, received IV access, given additional 2 mg IV Ativan.  Patient then calmed down for 30 to 40 minutes and become agitated again, put on Precedex .  Also started [place] bolus 500 cc and continue IV fluid.  Check urine output, check BMP in AM.  Will also order EEG to rule out seizure.  Daughter at bedside, had long discussion with daughter at bedside, updated pt current condition, treatment plan and potential prognosis, and answered all the questions.  She expressed understanding and appreciation.   Criticare minutes 30 minutes  Consuelo Denmark, MD PhD Stroke Neurology 06/08/2024 6:08 PM

## 2024-06-09 DIAGNOSIS — T508X4A Poisoning by diagnostic agents, undetermined, initial encounter: Secondary | ICD-10-CM

## 2024-06-09 DIAGNOSIS — R29704 NIHSS score 4: Secondary | ICD-10-CM | POA: Diagnosis not present

## 2024-06-09 DIAGNOSIS — I619 Nontraumatic intracerebral hemorrhage, unspecified: Secondary | ICD-10-CM | POA: Diagnosis not present

## 2024-06-09 DIAGNOSIS — T508X5A Adverse effect of diagnostic agents, initial encounter: Secondary | ICD-10-CM

## 2024-06-09 DIAGNOSIS — G928 Other toxic encephalopathy: Secondary | ICD-10-CM | POA: Diagnosis not present

## 2024-06-09 DIAGNOSIS — R451 Restlessness and agitation: Secondary | ICD-10-CM

## 2024-06-09 LAB — CBC
HCT: 39.2 % (ref 36.0–46.0)
Hemoglobin: 12.9 g/dL (ref 12.0–15.0)
MCH: 30.6 pg (ref 26.0–34.0)
MCHC: 32.9 g/dL (ref 30.0–36.0)
MCV: 92.9 fL (ref 80.0–100.0)
Platelets: 217 10*3/uL (ref 150–400)
RBC: 4.22 MIL/uL (ref 3.87–5.11)
RDW: 13.1 % (ref 11.5–15.5)
WBC: 9.9 10*3/uL (ref 4.0–10.5)
nRBC: 0 % (ref 0.0–0.2)

## 2024-06-09 LAB — URINALYSIS, ROUTINE W REFLEX MICROSCOPIC
Bilirubin Urine: NEGATIVE
Glucose, UA: NEGATIVE mg/dL
Ketones, ur: NEGATIVE mg/dL
Leukocytes,Ua: NEGATIVE
Nitrite: NEGATIVE
Protein, ur: NEGATIVE mg/dL
Specific Gravity, Urine: 1.01 (ref 1.005–1.030)
pH: 6 (ref 5.0–8.0)

## 2024-06-09 LAB — BASIC METABOLIC PANEL WITH GFR
Anion gap: 9 (ref 5–15)
BUN: 20 mg/dL (ref 8–23)
CO2: 21 mmol/L — ABNORMAL LOW (ref 22–32)
Calcium: 9.1 mg/dL (ref 8.9–10.3)
Chloride: 109 mmol/L (ref 98–111)
Creatinine, Ser: 0.84 mg/dL (ref 0.44–1.00)
GFR, Estimated: >60 mL/min (ref >60)
Glucose, Bld: 121 mg/dL — ABNORMAL HIGH (ref 70–99)
Potassium: 3.8 mmol/L (ref 3.5–5.1)
Sodium: 139 mmol/L (ref 135–145)

## 2024-06-09 LAB — HEPATIC FUNCTION PANEL
ALT: 16 U/L (ref 0–44)
AST: 19 U/L (ref 15–41)
Albumin: 3.5 g/dL (ref 3.5–5.0)
Alkaline Phosphatase: 46 U/L (ref 38–126)
Bilirubin, Direct: 0.1 mg/dL (ref 0.0–0.2)
Indirect Bilirubin: 0.4 mg/dL (ref 0.3–0.9)
Total Bilirubin: 0.5 mg/dL (ref 0.0–1.2)
Total Protein: 6.5 g/dL (ref 6.5–8.1)

## 2024-06-09 MED ORDER — HALOPERIDOL LACTATE 5 MG/ML IJ SOLN: 1.0000 mg | Freq: Four times a day (QID) | INTRAMUSCULAR | Status: DC | PRN

## 2024-06-09 MED ORDER — NICOTINE 14 MG/24HR TD PT24
14.0000 mg | MEDICATED_PATCH | Freq: Every day | TRANSDERMAL | Status: DC
  Administered 2024-06-09 – 2024-06-14 (×5): 14 mg via TRANSDERMAL
  Filled 2024-06-09 (×6): qty 1

## 2024-06-09 MED ORDER — QUETIAPINE FUMARATE 50 MG PO TABS
50.0000 mg | ORAL_TABLET | Freq: Every day | ORAL | Status: DC
  Administered 2024-06-09 – 2024-06-12 (×4): 50 mg via ORAL
  Filled 2024-06-09 (×2): qty 1
  Filled 2024-06-09: qty 2
  Filled 2024-06-09 (×2): qty 1

## 2024-06-09 NOTE — Progress Notes (Addendum)
 STROKE TEAM PROGRESS NOTE    SIGNIFICANT HOSPITAL EVENTS  6/10: presented w. Aphasia, headache  CTH: Acute R temporal ICH 6/11: CT stable hematoma without extension or other acute abnormality. CTA head and neck no LVO or AVM or aneurysm, CTV no venous sinus thrombosis.   Patient became agitated post-CT. Concern for contrast-induced encephalopathy. EEG negative. Placed on Precedex .   On-call Neurologist notified of increased somnolence around 2200, Precedex  held.  STAT CT stable.   INTERIM HISTORY/SUBJECTIVE  On this morning's exam, patient continues to be agitated and restless, requiring restraints.  She is able to say her name and that she is in the hospital sometimes.  She is uncooperative with neurological exam and further assessment. Continue Precedex  and keep in ICU.    OBJECTIVE  CBC    Component Value Date/Time   WBC 9.9 06/09/2024 0558   RBC 4.22 06/09/2024 0558   HGB 12.9 06/09/2024 0558   HCT 39.2 06/09/2024 0558   PLT 217 06/09/2024 0558   MCV 92.9 06/09/2024 0558   MCH 30.6 06/09/2024 0558   MCHC 32.9 06/09/2024 0558   RDW 13.1 06/09/2024 0558   LYMPHSABS 1.6 06/08/2024 0841   MONOABS 0.7 06/08/2024 0841   EOSABS 0.2 06/08/2024 0841   BASOSABS 0.1 06/08/2024 0841    BMET    Component Value Date/Time   NA 139 06/09/2024 0558   K 3.8 06/09/2024 0558   CL 109 06/09/2024 0558   CO2 21 (L) 06/09/2024 0558   GLUCOSE 121 (H) 06/09/2024 0558   BUN 20 06/09/2024 0558   CREATININE 0.84 06/09/2024 0558   CREATININE 1.03 (H) 10/22/2023 1114   CALCIUM  9.1 06/09/2024 0558   EGFR 55 (L) 10/22/2023 1114   GFRNONAA >60 06/09/2024 0558   GFRNONAA 48 (L) 06/18/2020 0959    IMAGING past 24 hours CT HEAD WO CONTRAST ( ) Result Date: 06/08/2024 CLINICAL DATA:  Follow-up examination for hemorrhagic stroke. EXAM: CT HEAD WITHOUT CONTRAST TECHNIQUE: Contiguous axial images were obtained from the base of the skull through the vertex without intravenous contrast. RADIATION  DOSE REDUCTION: This exam was performed according to the departmental dose-optimization program which includes automated exposure control, adjustment of the mA and/or kV according to patient size and/or use of iterative reconstruction technique. COMPARISON:  Comparison made with CTs from earlier the same day as well as previous studies. FINDINGS: Brain: Previously identified intraparenchymal hemorrhage positioned at the peripheral right temporal lobe is not significantly changed in size or morphology from previous. Surrounding edema without significant regional mass effect or midline shift, similar. No other new acute intracranial hemorrhage. No other acute large vessel territory infarct. No mass lesion or hydrocephalus. No extra-axial fluid collection. Underlying advanced cerebral white matter disease noted. Vascular: No abnormal hyperdense vessel. Scattered vascular calcifications noted within the carotid siphons. Skull: Scalp soft tissues and calvarium demonstrate no new finding. Sinuses/Orbits: Globes orbital soft tissues within normal limits. Paranasal sinuses are largely clear. Trace bilateral mastoid effusions noted, of doubtful significance. Other: None. IMPRESSION: 1. No significant interval change in size and morphology of intraparenchymal hemorrhage at the peripheral right temporal lobe. Surrounding edema without significant regional mass effect or midline shift. 2. No other new acute intracranial abnormality. 3. Underlying advanced cerebral white matter disease. Electronically Signed   By: Virgia Griffins M.D.   On: 06/08/2024 22:42   EEG adult Result Date: 06/08/2024 Arleene Lack, MD     06/08/2024 10:12 PM Patient Name: Alison Campbell MRN: [id number] Epilepsy Attending: Arleene Lack Referring Physician/Provider:  Consuelo Denmark, MD Date: 06/08/2024 Duration: 24.03 mins Patient history: 78yo F with right temporal ICH. EEG to evaluate for seizure. Level of alertness: comatose/ lethargic AEDs  during EEG study: Ativan , Versed  Technical aspects: This EEG study was done with scalp electrodes positioned according to the 10-20 International system of electrode placement. Electrical activity was reviewed with band pass filter of 1-70Hz , sensitivity of 7 uV/mm, display speed of 33mm/sec with a 60Hz  notched filter applied as appropriate. EEG data were recorded continuously and digitally stored.  Video monitoring was available and reviewed as appropriate. Description: EEG showed continuous generalized 3 to 6 Hz theta-delta slowing admixed with 15 to 18 Hz beta activity distributed symmetrically and diffusely. Hyperventilation and photic stimulation were not performed.   ABNORMALITY - Continuous slow, generalized IMPRESSION: This study is suggestive of moderate to severe diffuse encephalopathy. No seizures or epileptiform discharges were seen throughout the recording. Arleene Lack   ECHOCARDIOGRAM COMPLETE Result Date: 06/08/2024    ECHOCARDIOGRAM REPORT   Patient Name:   Alison Campbell Date of Exam: 06/08/2024 Medical Rec #:  [id number]         Height:       63.0 in Accession #:    [id number]        Weight:       138.4 lb Date of Birth:  06/14/1946         BSA:          1.654 m Patient Age:    78 years          BP:           130/55 mmHg Patient Gender: F                 HR:           60 bpm. Exam Location:  Inpatient Procedure: 2D Echo, Color Doppler and Cardiac Doppler (Both Spectral and Color            Flow Doppler were utilized during procedure). Indications:    Stroke  History:        Patient has no prior history of Echocardiogram examinations.  Sonographer:    Jeralene Mom Referring Phys: [id number] CORTNEY E DE LA TORRE IMPRESSIONS  1. Left ventricular ejection fraction, by estimation, is 60 to 65%. The left ventricle has normal function. The left ventricle has no regional wall motion abnormalities. There is moderate asymmetric left ventricular hypertrophy of the basal-septal segment. Left  ventricular diastolic parameters were normal.  2. Right ventricular systolic function is normal. The right ventricular size is normal.  3. The mitral valve is normal in structure. Trivial mitral valve regurgitation. No evidence of mitral stenosis.  4. The aortic valve is tricuspid. There is mild calcification of the aortic valve. Aortic valve regurgitation is not visualized. Aortic valve sclerosis is present, with no evidence of aortic valve stenosis. Comparison(s): No prior Echocardiogram. Conclusion(s)/Recommendation(s): Normal biventricular function without evidence of hemodynamically significant valvular heart disease. No intracardiac source of embolism detected on this transthoracic study. Consider a transesophageal echocardiogram to exclude cardiac source of embolism if clinically indicated. FINDINGS  Left Ventricle: Left ventricular ejection fraction, by estimation, is 60 to 65%. The left ventricle has normal function. The left ventricle has no regional wall motion abnormalities. The left ventricular internal cavity size was normal in size. There is  moderate asymmetric left ventricular hypertrophy of the basal-septal segment. Left ventricular diastolic parameters were normal. Right Ventricle: The right ventricular size is normal. No increase in  right ventricular wall thickness. Right ventricular systolic function is normal. Left Atrium: Left atrial size was normal in size. Right Atrium: Right atrial size was normal in size. Pericardium: Trivial pericardial effusion is present. Mitral Valve: The mitral valve is normal in structure. Trivial mitral valve regurgitation. No evidence of mitral valve stenosis. Tricuspid Valve: The tricuspid valve is normal in structure. Tricuspid valve regurgitation is trivial. No evidence of tricuspid stenosis. Aortic Valve: The aortic valve is tricuspid. There is mild calcification of the aortic valve. Aortic valve regurgitation is not visualized. Aortic valve sclerosis is  present, with no evidence of aortic valve stenosis. Aortic valve mean gradient measures 3.0 mmHg. Aortic valve peak gradient measures 5.6 mmHg. Aortic valve area, by VTI measures 1.86 cm. Pulmonic Valve: The pulmonic valve was not well visualized. Pulmonic valve regurgitation is not visualized. No evidence of pulmonic stenosis. Aorta: The aortic root and ascending aorta are structurally normal, with no evidence of dilitation. Venous: The inferior vena cava was not well visualized. IAS/Shunts: The atrial septum is grossly normal.  LEFT VENTRICLE PLAX 2D LVIDd:         3.90 cm   Diastology LVIDs:         2.50 cm   LV e' medial:    3.37 cm/s LV PW:         1.10 cm   LV E/e' medial:  11.8 LV IVS:        1.10 cm   LV e' lateral:   7.18 cm/s LVOT diam:     1.90 cm   LV E/e' lateral: 5.6 LV SV:         42 LV SV Index:   25 LVOT Area:     2.84 cm  RIGHT VENTRICLE RV Basal diam:  2.60 cm RV Mid diam:    2.30 cm RV S prime:     12.60 cm/s TAPSE (M-mode): 2.0 cm LEFT ATRIUM             Index        RIGHT ATRIUM          Index LA diam:        2.70 cm 1.63 cm/m   RA Area:     8.70 cm LA Vol (A2C):   34.1 ml 20.62 ml/m  RA Volume:   14.60 ml 8.83 ml/m LA Vol (A4C):   25.0 ml 15.12 ml/m LA Biplane Vol: 30.7 ml 18.56 ml/m  AORTIC VALVE AV Area (Vmax):    1.79 cm AV Area (Vmean):   1.79 cm AV Area (VTI):     1.86 cm AV Vmax:           118.00 cm/s AV Vmean:          80.100 cm/s AV VTI:            0.224 m AV Peak Grad:      5.6 mmHg AV Mean Grad:      3.0 mmHg LVOT Vmax:         74.40 cm/s LVOT Vmean:        50.700 cm/s LVOT VTI:          0.147 m LVOT/AV VTI ratio: 0.66  AORTA Ao Root diam: 2.90 cm Ao Asc diam:  2.60 cm MITRAL VALVE MV Area (PHT): 3.48 cm    SHUNTS MV Decel Time: 218 msec    Systemic VTI:  0.15 m MV E velocity: 39.90 cm/s  Systemic Diam: 1.90 cm MV A velocity: 75.10 cm/s MV  E/A ratio:  0.53 Sheryle Donning MD Electronically signed by Sheryle Donning MD Signature Date/Time: 06/08/2024/6:50:36 PM     Final    CT ANGIO HEAD NECK W WO CM Result Date: 06/08/2024 CLINICAL DATA:  Provided history: Stroke, hemorrhagic. EXAM: CT ANGIOGRAPHY HEAD AND NECK WITH AND WITHOUT CONTRAST CT VENOGRAM HEAD TECHNIQUE: Multidetector CT imaging of the head and neck was performed using the standard protocol during bolus administration of intravenous contrast. Multiplanar CT image reconstructions and MIPs were obtained to evaluate the vascular anatomy. Carotid stenosis measurements (when applicable) are obtained utilizing NASCET criteria, using the distal internal carotid diameter as the denominator. Venographic phase images of the brain were obtained following the administration of intravenous contrast. Multiplanar reformats and maximum intensity projections were generated. RADIATION DOSE REDUCTION: This exam was performed according to the departmental dose-optimization program which includes automated exposure control, adjustment of the mA and/or kV according to patient size and/or use of iterative reconstruction technique. CONTRAST:  CONTRAST 75mL OMNIPAQUE  IOHEXOL  350 MG/ML SOLN COMPARISON:  MRI brain and MRA head 06/07/2024. Non-contrast head CT 06/07/2024. FINDINGS: CT HEAD FINDINGS Significantly motion degraded examination. Within this limitation, findings are swallows. Brain: Generalized cerebral atrophy. Unchanged size of an acute parenchymal hemorrhage within the posterior right temporal lobe, again measuring 2.9 x 2.4 cm (remeasured on prior). Surrounding edema is similar. No midline shift. Moderate-to-advanced patchy and ill-defined hypoattenuation elsewhere within the cerebral white matter, nonspecific but compatible chronic small vessel disease. Vascular: No hyperdense vessel.  Atherosclerotic calcifications. Skull: No calvarial fracture or aggressive osseous lesion. Sinuses/Orbits: No orbital mass or acute orbital finding. No significant paranasal sinus disease. Review of the MIP images confirms the above findings  CTA NECK FINDINGS Aortic arch: Standard aortic branching. Atherosclerotic plaque within the visualized thoracic aorta and proximal major branch vessels of the neck. Streak/beam hardening artifact arising from a dense contrast bolus partially obscures the right subclavian artery. Within this limitation, there is no appreciable hemodynamically significant innominate or proximal subclavian artery stenosis. Right carotid system: CCA and ICA patent within the neck without hemodynamically significant stenosis (50% or greater). Atherosclerotic plaque, greatest the carotid bifurcation and within the proximal ICA. Left carotid system: CCA and ICA patent within the neck without hemodynamically significant stenosis (50% or greater). Atherosclerotic plaque, greatest within the distal CCA and about the carotid bifurcation. Vertebral arteries: Patent within the neck without stenosis. The left vertebral artery is dominant. Skeleton: Levocurvature of the cervical spine. Dextrocurvature of the upper thoracic spine, partially imaged. Cervical spondylosis. Other neck: Multiple thyroid  nodules, the largest within the right lobe measuring 1.7 cm. Upper chest: No consolidation within the imaged lung apices. Prior right upper/lower lobe lung biopsies. Emphysema. Review of the MIP images confirms the above findings CTA HEAD FINDINGS Anterior circulation: The intracranial internal carotid arteries are patent. Non-stenotic calcified plaque within both vessels. The M1 middle cerebral arteries are patent. No M2 proximal branch occlusion or high-grade proximal stenosis. The anterior cerebral arteries are patent. No intracranial aneurysm is identified. No evidence of an arteriovenous malformation. Posterior circulation: The intracranial vertebral arteries are patent. The non-dominant right vertebral artery is developmentally diminutive beyond the PICA origin. The basilar artery is patent. The posterior cerebral arteries are patent. Fetal origin  posterior cerebral arteries, bilateral. Venous sinuses: Reported below. Anatomic variants: As described Review of the MIP images confirms the above findings CT VENOGRAM HEAD FINDINGS: The superior sagittal sinus, internal cerebral veins, vein of Galen, straight sinus, transverse sinuses, sigmoid sinuses and visualized jugular veins are patent. There  is no appreciable intracranial venous thrombosis. IMPRESSION: Non-contrast head CT: 1. Significantly motion degraded examination. Within this limitation, findings are as follows. 2. Acute parenchymal hemorrhage within the right temporal lobe with surrounding edema, unchanged. 3. Background parenchymal atrophy and chronic small vessel ischemic disease. CTA neck: 1. Common carotid and internal carotid arteries patent within the neck without hemodynamically significant stenosis (50% or greater). Atherosclerotic plaque bilaterally, as described. 2. Vertebral arteries patent within the neck without stenosis. 3. Aortic Atherosclerosis (ICD10-I70.0) and Emphysema (ICD10-J43.9). 4. Multiple thyroid  nodules, the largest within the right lobe measuring 1.7 cm. Given patient's age, a nonemergent thyroid  ultrasound may be obtained for further evaluation, as clinically appropriate. CTA head: 1. No proximal intracranial large vessel occlusion or high-grade proximal arterial stenosis. Non-stenotic plaque within the intracranial carotid arteries. 2. No evidence of an intracranial aneurysm or arteriovenous malformation. CT venogram head: No evidence of dural venous sinus thrombosis. Electronically Signed   By: Bascom Lily D.O.   On: 06/08/2024 17:13   CT VENOGRAM HEAD Result Date: 06/08/2024 CLINICAL DATA:  Provided history: Stroke, hemorrhagic. EXAM: CT ANGIOGRAPHY HEAD AND NECK WITH AND WITHOUT CONTRAST CT VENOGRAM HEAD TECHNIQUE: Multidetector CT imaging of the head and neck was performed using the standard protocol during bolus administration of intravenous contrast. Multiplanar  CT image reconstructions and MIPs were obtained to evaluate the vascular anatomy. Carotid stenosis measurements (when applicable) are obtained utilizing NASCET criteria, using the distal internal carotid diameter as the denominator. Venographic phase images of the brain were obtained following the administration of intravenous contrast. Multiplanar reformats and maximum intensity projections were generated. RADIATION DOSE REDUCTION: This exam was performed according to the departmental dose-optimization program which includes automated exposure control, adjustment of the mA and/or kV according to patient size and/or use of iterative reconstruction technique. CONTRAST:  CONTRAST 75mL OMNIPAQUE  IOHEXOL  350 MG/ML SOLN COMPARISON:  MRI brain and MRA head 06/07/2024. Non-contrast head CT 06/07/2024. FINDINGS: CT HEAD FINDINGS Significantly motion degraded examination. Within this limitation, findings are swallows. Brain: Generalized cerebral atrophy. Unchanged size of an acute parenchymal hemorrhage within the posterior right temporal lobe, again measuring 2.9 x 2.4 cm (remeasured on prior). Surrounding edema is similar. No midline shift. Moderate-to-advanced patchy and ill-defined hypoattenuation elsewhere within the cerebral white matter, nonspecific but compatible chronic small vessel disease. Vascular: No hyperdense vessel.  Atherosclerotic calcifications. Skull: No calvarial fracture or aggressive osseous lesion. Sinuses/Orbits: No orbital mass or acute orbital finding. No significant paranasal sinus disease. Review of the MIP images confirms the above findings CTA NECK FINDINGS Aortic arch: Standard aortic branching. Atherosclerotic plaque within the visualized thoracic aorta and proximal major branch vessels of the neck. Streak/beam hardening artifact arising from a dense contrast bolus partially obscures the right subclavian artery. Within this limitation, there is no appreciable hemodynamically significant  innominate or proximal subclavian artery stenosis. Right carotid system: CCA and ICA patent within the neck without hemodynamically significant stenosis (50% or greater). Atherosclerotic plaque, greatest the carotid bifurcation and within the proximal ICA. Left carotid system: CCA and ICA patent within the neck without hemodynamically significant stenosis (50% or greater). Atherosclerotic plaque, greatest within the distal CCA and about the carotid bifurcation. Vertebral arteries: Patent within the neck without stenosis. The left vertebral artery is dominant. Skeleton: Levocurvature of the cervical spine. Dextrocurvature of the upper thoracic spine, partially imaged. Cervical spondylosis. Other neck: Multiple thyroid  nodules, the largest within the right lobe measuring 1.7 cm. Upper chest: No consolidation within the imaged lung apices. Prior right upper/lower lobe lung  biopsies. Emphysema. Review of the MIP images confirms the above findings CTA HEAD FINDINGS Anterior circulation: The intracranial internal carotid arteries are patent. Non-stenotic calcified plaque within both vessels. The M1 middle cerebral arteries are patent. No M2 proximal branch occlusion or high-grade proximal stenosis. The anterior cerebral arteries are patent. No intracranial aneurysm is identified. No evidence of an arteriovenous malformation. Posterior circulation: The intracranial vertebral arteries are patent. The non-dominant right vertebral artery is developmentally diminutive beyond the PICA origin. The basilar artery is patent. The posterior cerebral arteries are patent. Fetal origin posterior cerebral arteries, bilateral. Venous sinuses: Reported below. Anatomic variants: As described Review of the MIP images confirms the above findings CT VENOGRAM HEAD FINDINGS: The superior sagittal sinus, internal cerebral veins, vein of Galen, straight sinus, transverse sinuses, sigmoid sinuses and visualized jugular veins are patent. There is  no appreciable intracranial venous thrombosis. IMPRESSION: Non-contrast head CT: 1. Significantly motion degraded examination. Within this limitation, findings are as follows. 2. Acute parenchymal hemorrhage within the right temporal lobe with surrounding edema, unchanged. 3. Background parenchymal atrophy and chronic small vessel ischemic disease. CTA neck: 1. Common carotid and internal carotid arteries patent within the neck without hemodynamically significant stenosis (50% or greater). Atherosclerotic plaque bilaterally, as described. 2. Vertebral arteries patent within the neck without stenosis. 3. Aortic Atherosclerosis (ICD10-I70.0) and Emphysema (ICD10-J43.9). 4. Multiple thyroid  nodules, the largest within the right lobe measuring 1.7 cm. Given patient's age, a nonemergent thyroid  ultrasound may be obtained for further evaluation, as clinically appropriate. CTA head: 1. No proximal intracranial large vessel occlusion or high-grade proximal arterial stenosis. Non-stenotic plaque within the intracranial carotid arteries. 2. No evidence of an intracranial aneurysm or arteriovenous malformation. CT venogram head: No evidence of dural venous sinus thrombosis. Electronically Signed   By: Bascom Lily D.O.   On: 06/08/2024 17:13    Vitals:   06/09/24 0500 06/09/24 0600 06/09/24 0700 06/09/24 0800  BP: (!) 141/66 (!) 151/100 (!) 126/58 137/63  Pulse: 66 66 66 64  Resp: 15 16 14 16   Temp:      TempSrc:      SpO2: 95% 95% 97% 98%  Weight:      Height:         PHYSICAL EXAM General:  Alert, well-nourished, well-developed patient in no acute distress Psych:  Mood and affect appropriate for situation CV: Regular rate and rhythm on monitor Respiratory:  Regular, unlabored respirations on room air GI: Abdomen soft and nontender   NEURO:  Mental Status: Awake and alert, oriented to age, place, month/year. Speech/Language: No dysarthria or aphasia.  Follows all simple commands she is able to name  objects and repeat sentences. Cranial Nerves:  II: PERRL.  VFF on right, inconsistent left visual field testing III, IV, VI: EOMI. no gaze palsy, no nystagmus. V: Sensation is intact to light touch and symmetrical to face.  VII: Face is symmetrical resting and smiling VIII: hearing intact to voice. IX, X: Palate elevates symmetrically. Phonation is normal.  UU:[id number] shrug 5/5. XII: tongue protrusion midline Motor: 5/5 strength to all muscle groups tested.  Tone: is normal and bulk is normal Sensation-positive symmetric withdraw in all extremities Coordination: Slight dysmetria left FTN testing Gait- deferred  Most Recent NIH: 4.    ASSESSMENT/PLAN  [name] is a 78 y.o. female with history of HLD, pulmonary fibrosis who presented as a code stroke 6/10 due to acute onset of aphasia and altered mental status, she also complained of a headache on the  right side since 6/9 PM.  CT head showed right temporal IPH and she was admitted for full stroke workup.  NIH on Admission: 4..  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 Surrounding edema without significant regional mass effect. May repeat MRI when patient able to remain still  CT repeat stable hematoma CT head and neck bilateral ICA bulb atherosclerosis, no AVM or aneurysm CTV no dural venous sinus thrombosis Repeat CT 6/11 pm: Stable Consider MRI with and without contrast after hematoma resolution to rule out underlying malignancy 2D Echo: 60-65% LDL 53 HgbA1c 5.6 VTE prophylaxis - SCDs No antithrombotic prior to admission, continue No antithrombotic due to  ICH. Therapy recommendations:  CIR Disposition:  pending   ?Contrast-induced encephalopathy Continued Agitation/Restlessness Increased agitation post-CTA/CTV 6/11 Given Haldol /Ativan  IM->ativan  IV->Started on Precedex  Repeat CT stable PTA medications reviewed, none with withdrawal worry Pt denies alcohol UA negative Add seroquel  50mg  at bedtime Add Haldol  IM Q6H PRN Wean Precedex , as able Off IV bolus and IVF  Hypertension Home meds:  none Unstable, increased with agitation Labetalol  PRN Q2H BP goal less than 160 Long-term BP goal normotensive  Hyperlipidemia Home meds:  Zetia  10mg  and Crestor  20mg  (3 times a week) LDL 53, goal < 70 Hold off statin for now Will discuss with pt about SATURN trial--daughter seems interested   Tobacco Abuse Patient smokes a pack per day for >50 years      Ready to quit? No Nicotine  replacement therapy provided Continue cessation education  Other Active Problems Urinary Retention Straight Cath *2 performed Ok for foley with next in&out needed UA today negative COPD CKD stage 3, creatinine 1.16--1.03--1.08-0.84 Anxiety Pulmonary Fibrosis, currently enrolled in [employer] trial, Ok to continue  pirfenidone , nonformulary, family will need to bring in   Hospital day # 2   Pt seen by Neuro NP/APP with MD. Note/plan to be edited by MD as needed.    Audrene Lease, DNP, AGACNP-BC [facility] Please use AMION for contact information & EPIC for messaging.  ATTENDING NOTE: I reviewed above note and agree with the assessment and plan. Pt was seen and examined.   No family at the bedside. Pt sitting in bed, orientated to place, age and people but still agitated and asking for removal of the restrain and restless, wiggling in bed. Better than last night, still on precedex  and about to wean off. Moving all extremities and no facial asymmetry. CT repeat stable yesterday. Will do haldol  PRN and give seroquel  at bedtime. On  diet.  For detailed assessment and plan, please refer to above as I have made changes wherever appropriate.   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 This patient's care requires constant monitoring of vital signs, hemodynamics, respiratory and cardiac monitoring, review of multiple databases, neurological assessment, discussion with family, other specialists and medical decision making of high complexity. I spent 35 minutes of neurocritical care time in the care of this patient. I had long discussion with daughter at bedside, updated pt current condition, treatment plan  and potential prognosis, and answered all the questions. She expressed understanding and appreciation.

## 2024-06-09 NOTE — Progress Notes (Signed)
 Speech Language Pathology Treatment: Cognitive-Linguistic  Patient Details Name: Steffanie Mingle MRN: [id number] DOB: 01/20/1946 Today's Date: 06/09/2024 Time: 8295-6213 SLP Time Calculation (min) (ACUTE ONLY): 17 min  Assessment / Plan / Recommendation Clinical Impression  Pt seen for f/u cognitive/linguistic tx/further cognitive assessment attempt with limited session completed d/t pt current impaired mental status. Nursing staff stated she exhibited decreased mental status post CT scan completion prior date d/t sedatives given to complete testing affecting cognition overall. Pt oriented to self, situation and time, but not place. Discussed familiar sequencing steps with recipe with mod redirection to maintain focus on task and missing information d/t decreased sustained attention and 50% accuracy obtained. Decreased sustained attention/awareness of deficits noted this session likely d/t medication effects paired with recent ICH/mental fatigue. Pt repeatedly attempted to pull lines/wires during session with redirection/repositioning proving beneficial d/t lack of safety awareness. Pt able to follow simple directives, but required min verbal cues to complete throughout session. She answered simple questions with mod cues and 65% accuracy obtained. She stated they like to tangle you up around here when attempting to remove leads.  ST will continue to f/u for further cognitive/linguistic assessment and ongoing cognitive/linguistic tx in acute setting.   HPI HPI: Patient is an 78 y.o. female with PMH: pulmonary fibrosis, HLD. She presented to the hospital on 06/07/24 with acute onset of aphasia and patient reporting a right sided headache which started the day prior to admission. At baseline, patient is independent with ADL's and still drives. CT head revealed acute right temporal lobe hemorrhage. MRI was motion degraded but showed intraparenchymal hemorrhage involving the right temporal lobe. Surrounding  edema without significant regional mass effect. ST evaluated and recommended f/u for cognitive/linguistic deficits in acute setting.      SLP Plan  Continue with current plan of care          Recommendations                         Intermittent Supervision/Assistance Cognitive communication deficit (Y86.578)     Continue with current plan of care     Pat Lesle Faron,M.S.,CCC-SLP  06/09/2024, 2:01 PM

## 2024-06-09 NOTE — TOC CAGE-AID Note (Signed)
 Transition of Care [facility]) - CAGE-AID Screening   Patient Details  Name: Alison Campbell MRN: [id number] Date of Birth: Aug 19, 1946  Transition of Care [facility]) CM/SW Contact:    Jinger Middlesworth E Amarilis Belflower, LCSW Phone Number: 06/09/2024, 10:40 AM   Clinical Narrative:    CAGE-AID Screening: Substance Abuse Screening unable to be completed due to: : Patient unable to participate

## 2024-06-09 NOTE — Progress Notes (Signed)
 Inpatient Rehab Admissions Coordinator:   Per therapy recommendations pt was screened for CIR by Loye Rumble, PT, DPT.  Note on eval requiring only guarding assist for 250' of ambulation with no device.  No 24/7 caregiver available per evaluations and given cognitive deficits documented would likely require 24/7 supervision at minimum.  Based on evaluation and expected goals she is already nearing goal level and could likely discharge home with caregiver support when medically stable.  However, note required precedex  for agitation yesterday.  Head CT stable.  Will see how she does with therapy today, but given her [facility] Medicare she will need to demonstrate a more significant decline to qualify for CIR.    Loye Rumble, PT, DPT Admissions Coordinator [phone] 06/09/24  11:14 AM

## 2024-06-09 NOTE — Progress Notes (Signed)
 Physical Therapy Treatment Patient Details Name: Alison Campbell MRN: [id number] DOB: 09-10-1946 Today's Date: 06/09/2024   History of Present Illness Pt is an 78 y.o. female presenting 6/10 with acute onset aphasia. CTH reveals acute R temporal lobe hemorrhage. PMH significant for pulmonary fibrosis and HLD.    PT Comments  Pt mildly impulsive, but overall calm and cooperative. Demonstrates deficits in attention, awareness, problem solving. Pt A&O x 4. Pt ambulating unit with no AD and up to min assist with balance challenge or turns. Worked on multi directional stepping and dynamic standing balance task with visual scanning. Pt with difficulty locating objects in left peripheral vision. Will benefit from follow up PT to address deficits and recommend 24/7 supervision.    If plan is discharge home, recommend the following: A little help with walking and/or transfers;Assistance with cooking/housework;Assist for transportation;Help with stairs or ramp for entrance   Can travel by private vehicle        Equipment Recommendations  None recommended by PT    Recommendations for Other Services       Precautions / Restrictions Precautions Precautions: Fall Precaution/Restrictions Comments: L visual field cut? Restrictions Weight Bearing Restrictions Per Provider Order: No     Mobility  Bed Mobility Overal bed mobility: Needs Assistance Bed Mobility: Sit to Supine       Sit to supine: Supervision        Transfers Overall transfer level: Needs assistance Equipment used: None Transfers: Sit to/from Stand Sit to Stand: Contact guard assist                Ambulation/Gait Ambulation/Gait assistance: Contact guard assist, Min assist Gait Distance (Feet): 250 Feet Assistive device: None Gait Pattern/deviations: Step-through pattern, Decreased stride length Gait velocity: decreased     General Gait Details: Pt requiring up to min assist with postural stability,  particularly with turns or direction changes   Stairs             Wheelchair Mobility     Tilt Bed    Modified Rankin (Stroke Patients Only) Modified Rankin (Stroke Patients Only) Pre-Morbid Rankin Score: No symptoms Modified Rankin: Moderately severe disability     Balance Overall balance assessment: Needs assistance Sitting-balance support: Feet supported Sitting balance-Leahy Scale: Good     Standing balance support: No upper extremity supported Standing balance-Leahy Scale: Fair                              Hotel manager: No apparent difficulties  Cognition Arousal: Alert Behavior During Therapy: Flat affect, Impulsive   PT - Cognitive impairments: Awareness, Memory, Attention, Sequencing, Problem solving, Safety/Judgement                         Following commands: Impaired Following commands impaired: Follows multi-step commands inconsistently    Cueing Cueing Techniques: Verbal cues  Exercises Other Exercises Other Exercises: Standing: functional reaching to targets also addressing visual scanning Other Exercises: Lateral stepping to R/L with rail x 15 ft in both directions Other Exercises: Backwards walking    General Comments        Pertinent Vitals/Pain Pain Assessment Pain Assessment: Faces Faces Pain Scale: Hurts a little bit Pain Location: Headache Pain Descriptors / Indicators: Headache Pain Intervention(s): Monitored during session    Home Living Family/patient expects to be discharged to:: Private residence Living Arrangements: Alone Available Help at Discharge: Family;Available PRN/intermittently Type of Home:  House Home Access: Stairs to enter Entrance Stairs-Rails: Right;Left;Can reach both Entrance Stairs-Number of Steps: 4   Home Layout: One level Home Equipment: Agricultural consultant (2 wheels);Cane - single point Additional Comments: Son is long Production assistant, radio, daughter is  an Engineer, building services            PT Goals (current goals can now be found in the care plan section) Acute Rehab PT Goals Patient Stated Goal: did not state Potential to Achieve Goals: Good Progress towards PT goals: Progressing toward goals    Frequency    Min 2X/week      PT Plan      Co-evaluation              AM-PAC PT 6 Clicks Mobility   Outcome Measure  Help needed turning from your back to your side while in a flat bed without using bedrails?: None Help needed moving from lying on your back to sitting on the side of a flat bed without using bedrails?: A Little Help needed moving to and from a bed to a chair (including a wheelchair)?: A Little Help needed standing up from a chair using your arms (e.g., wheelchair or bedside chair)?: A Little Help needed to walk in hospital room?: A Little Help needed climbing 3-5 steps with a railing? : A Little 6 Click Score: 19    End of Session Equipment Utilized During Treatment: Gait belt Activity Tolerance: Patient tolerated treatment well Patient left: in bed;with call bell/phone within reach;with bed alarm set Nurse Communication: Mobility status PT Visit Diagnosis: Unsteadiness on feet (R26.81)     Time: 1610-9604 PT Time Calculation (min) (ACUTE ONLY): 8 min  Charges:    $Therapeutic Activity: 8-22 mins PT General Charges $$ ACUTE PT VISIT: 1 Visit                     Verdia Glad, PT, DPT Acute Rehabilitation Services Office [phone]    Claria Crofts 06/09/2024, 3:38 PM

## 2024-06-09 NOTE — Evaluation (Signed)
 Occupational Therapy Evaluation Patient Details Name: Alison Campbell MRN: [id number] DOB: 11-Dec-1946 Today's Date: 06/09/2024   History of Present Illness   Pt is an 78 y.o. female presenting 6/10 with acute onset aphasia. CTH reveals acute R temporal lobe hemorrhage. PMH significant for pulmonary fibrosis and HLD.     Clinical Impressions Patient admitted for the diagnosis above.  PTA she lives alone, but has support from her son and daughter.  Patient is very independent at prior level, needing no assist for ADL,iADL or mobility.  Deficits impacting independence are listed below, L visual field deficit and cognitive impairments are the most impactful.  Currently she is needing up to Min A or CGA for ADL completion and in room mobility, with safety and cues for vision, she is probably is needing more towards Min A level.  OT will continue efforts in the acute setting to address deficits.  Patient could not transition home without 24 hour supervision, if AIR is not an option, would recommend SNF rehab.  If family could provide the needed supervision, outpatient neuro rehab could be an option.       If plan is discharge home, recommend the following:   A little help with walking and/or transfers;A little help with bathing/dressing/bathroom;Assistance with cooking/housework;Direct supervision/assist for financial management;Supervision due to cognitive status;Assist for transportation;Direct supervision/assist for medications management     Functional Status Assessment   Patient has had a recent decline in their functional status and demonstrates the ability to make significant improvements in function in a reasonable and predictable amount of time.     Equipment Recommendations   Tub/shower seat     Recommendations for Other Services         Precautions/Restrictions   Precautions Precautions: Fall Precaution/Restrictions Comments: L visual field  cut? Restrictions Weight Bearing Restrictions Per Provider Order: No     Mobility Bed Mobility Overal bed mobility: Needs Assistance Bed Mobility: Supine to Sit     Supine to sit: Supervision       Patient Response: Impulsive  Transfers Overall transfer level: Needs assistance Equipment used: None Transfers: Sit to/from Stand Sit to Stand: Contact guard assist                  Balance Overall balance assessment: Needs assistance Sitting-balance support: Feet supported Sitting balance-Leahy Scale: Good     Standing balance support: No upper extremity supported Standing balance-Leahy Scale: Fair                             ADL either performed or assessed with clinical judgement   ADL       Grooming: Set up;Contact guard assist               Lower Body Dressing: Contact guard assist;Sit to/from stand   Toilet Transfer: Contact guard assist;Minimal assistance;Ambulation;Regular Toilet                   Vision Ability to See in Adequate Light: 2 Moderately impaired Patient Visual Report: Peripheral vision impairment Vision Assessment?: Yes Visual Fields: Left visual field deficit Depth Perception: Overshoots;Undershoots     Perception Perception: Within Functional Limits       Praxis Praxis: WFL       Pertinent Vitals/Pain Pain Assessment Pain Assessment: Faces Faces Pain Scale: Hurts a little bit Pain Location: Headache Pain Descriptors / Indicators: Headache Pain Intervention(s): Monitored during session     Extremity/Trunk Assessment  Upper Extremity Assessment Upper Extremity Assessment: Overall WFL for tasks assessed       Cervical / Trunk Assessment Cervical / Trunk Assessment: Normal   Communication Communication Communication: No apparent difficulties   Cognition Arousal: Alert Behavior During Therapy: Flat affect, Impulsive Cognition: Cognition impaired   Orientation impairments: Place, Situation,  Time   Memory impairment (select all impairments): Short-term memory, Working memory Attention impairment (select first level of impairment): Focused attention Executive functioning impairment (select all impairments): Reasoning, Problem solving                   Following commands: Impaired Following commands impaired: Follows multi-step commands inconsistently     Cueing  General Comments   Cueing Techniques: Verbal cues      Exercises     Shoulder Instructions      Home Living Family/patient expects to be discharged to:: Private residence Living Arrangements: Alone Available Help at Discharge: Family;Available PRN/intermittently Type of Home: House Home Access: Stairs to enter [employer] of Steps: 4 Entrance Stairs-Rails: Right;Left;Can reach both Home Layout: One level     Bathroom Shower/Tub: Tub/shower unit;Walk-in shower   Bathroom Toilet: Standard Bathroom Accessibility: Yes How Accessible: Accessible via walker Home Equipment: Rolling Walker (2 wheels);Cane - single point   Additional Comments: Son is long Production assistant, radio, daughter is an Advertising account executive With: Alone    Prior Functioning/Environment Prior Level of Function : Independent/Modified Independent             Mobility Comments: ind with mobility ADLs Comments: independent ADL's/IADL's, drives to [employer] every Sunday for church, does grocery shopping. very independent    OT Problem List: Impaired balance (sitting and/or standing);Impaired vision/perception;Decreased safety awareness;Decreased cognition;Impaired sensation   OT Treatment/Interventions: Self-care/ADL training;Therapeutic activities;Cognitive remediation/compensation;Visual/perceptual remediation/compensation;Patient/family education;Balance training      OT Goals(Current goals can be found in the care plan section)   Acute Rehab OT Goals OT Goal Formulation: Patient unable to participate in goal  setting Time For Goal Achievement: 06/23/24 Potential to Achieve Goals: Fair ADL Goals Pt Will Perform Grooming: with modified independence;standing Pt Will Perform Lower Body Dressing: with modified independence;sit to/from stand Pt Will Transfer to Toilet: with modified independence;regular height toilet;ambulating Additional ADL Goal #1: Patient will scan L visual field 50% of the time with Min VC's   OT Frequency:  Min 2X/week    Co-evaluation              AM-PAC OT 6 Clicks Daily Activity     Outcome Measure Help from another person eating meals?: A Little Help from another person taking care of personal grooming?: A Little Help from another person toileting, which includes using toliet, bedpan, or urinal?: A Little Help from another person bathing (including washing, rinsing, drying)?: A Little Help from another person to put on and taking off regular upper body clothing?: A Little Help from another person to put on and taking off regular lower body clothing?: A Little 6 Click Score: 18   End of Session Equipment Utilized During Treatment: Gait belt Nurse Communication: Mobility status  Activity Tolerance: Patient tolerated treatment well Patient left: in bed;with call bell/phone within reach;with bed alarm set  OT Visit Diagnosis: Unsteadiness on feet (R26.81)                Time: 1610-9604 OT Time Calculation (min): 19 min Charges:  OT General Charges $OT Visit: 1 Visit OT Evaluation $OT Eval Moderate Complexity: 1 Mod  06/09/2024  RP, OTR/L  Acute Rehabilitation Services  Office:  [phone]   Benjamen Brand 06/09/2024, 2:57 PM

## 2024-06-10 ENCOUNTER — Inpatient Hospital Stay (HOSPITAL_COMMUNITY)

## 2024-06-10 DIAGNOSIS — R29704 NIHSS score 4: Secondary | ICD-10-CM | POA: Diagnosis not present

## 2024-06-10 DIAGNOSIS — I611 Nontraumatic intracerebral hemorrhage in hemisphere, cortical: Secondary | ICD-10-CM | POA: Diagnosis not present

## 2024-06-10 DIAGNOSIS — J8410 Pulmonary fibrosis, unspecified: Secondary | ICD-10-CM

## 2024-06-10 DIAGNOSIS — I129 Hypertensive chronic kidney disease with stage 1 through stage 4 chronic kidney disease, or unspecified chronic kidney disease: Secondary | ICD-10-CM | POA: Diagnosis not present

## 2024-06-10 DIAGNOSIS — N183 Chronic kidney disease, stage 3 unspecified: Secondary | ICD-10-CM | POA: Diagnosis not present

## 2024-06-10 LAB — BASIC METABOLIC PANEL WITH GFR
Anion gap: 9 (ref 5–15)
BUN: 23 mg/dL (ref 8–23)
CO2: 21 mmol/L — ABNORMAL LOW (ref 22–32)
Calcium: 9 mg/dL (ref 8.9–10.3)
Chloride: 112 mmol/L — ABNORMAL HIGH (ref 98–111)
Creatinine, Ser: 1.02 mg/dL — ABNORMAL HIGH (ref 0.44–1.00)
GFR, Estimated: 56 mL/min — ABNORMAL LOW (ref >60)
Glucose, Bld: 81 mg/dL (ref 70–99)
Potassium: 3.3 mmol/L — ABNORMAL LOW (ref 3.5–5.1)
Sodium: 142 mmol/L (ref 135–145)

## 2024-06-10 LAB — CBC
HCT: 36.7 % (ref 36.0–46.0)
Hemoglobin: 11.9 g/dL — ABNORMAL LOW (ref 12.0–15.0)
MCH: 30.4 pg (ref 26.0–34.0)
MCHC: 32.4 g/dL (ref 30.0–36.0)
MCV: 93.9 fL (ref 80.0–100.0)
Platelets: 232 10*3/uL (ref 150–400)
RBC: 3.91 MIL/uL (ref 3.87–5.11)
RDW: 13.4 % (ref 11.5–15.5)
WBC: 8.5 10*3/uL (ref 4.0–10.5)
nRBC: 0 % (ref 0.0–0.2)

## 2024-06-10 MED ORDER — AMLODIPINE BESYLATE 5 MG PO TABS
10.0000 mg | ORAL_TABLET | Freq: Every day | ORAL | Status: DC
  Administered 2024-06-10 – 2024-06-14 (×4): 10 mg via ORAL
  Filled 2024-06-10: qty 2
  Filled 2024-06-10: qty 1
  Filled 2024-06-10 (×3): qty 2

## 2024-06-10 MED ORDER — GADOBUTROL 1 MMOL/ML IV SOLN
6.0000 mL | Freq: Once | INTRAVENOUS | Status: AC | PRN
  Administered 2024-06-10: 6 mL via INTRAVENOUS

## 2024-06-10 MED ORDER — POTASSIUM CHLORIDE CRYS ER 20 MEQ PO TBCR
40.0000 meq | EXTENDED_RELEASE_TABLET | Freq: Once | ORAL | Status: AC
  Administered 2024-06-10: 40 meq via ORAL
  Filled 2024-06-10: qty 2

## 2024-06-10 NOTE — Progress Notes (Addendum)
 STROKE TEAM PROGRESS NOTE    SIGNIFICANT HOSPITAL EVENTS  6/10: presented w. Aphasia, headache  CTH: Acute R temporal ICH 6/11: CT stable hematoma without extension or other acute abnormality. CTA head and neck no LVO or AVM or aneurysm, CTV no venous sinus thrombosis.  Patient became agitated post-CT. Concern for contrast-induced encephalopathy. EEG negative. Placed on Precedex .  On-call Neurologist notified of increased somnolence around 2200, Precedex  held.  STAT CT stable.   INTERIM HISTORY/SUBJECTIVE  No family at the bedside. Patient is laying in bed in NAD. She is calm and cooperative today.   MRI brain w contrast ordered. Will transfer patient out f the ICU today and ask medical hospitalist to assume care of patient tomorrow   K3.3 will replace today.   CBC    Component Value Date/Time   WBC 8.5 06/10/2024 0701   RBC 3.91 06/10/2024 0701   HGB 11.9 (L) 06/10/2024 0701   HCT 36.7 06/10/2024 0701   PLT 232 06/10/2024 0701   MCV 93.9 06/10/2024 0701   MCH 30.4 06/10/2024 0701   MCHC 32.4 06/10/2024 0701   RDW 13.4 06/10/2024 0701   LYMPHSABS 1.6 06/08/2024 0841   MONOABS 0.7 06/08/2024 0841   EOSABS 0.2 06/08/2024 0841   BASOSABS 0.1 06/08/2024 0841    BMET    Component Value Date/Time   NA 142 06/10/2024 0701   K 3.3 (L) 06/10/2024 0701   CL 112 (H) 06/10/2024 0701   CO2 21 (L) 06/10/2024 0701   GLUCOSE 81 06/10/2024 0701   BUN 23 06/10/2024 0701   CREATININE 1.02 (H) 06/10/2024 0701   CREATININE 1.03 (H) 10/22/2023 1114   CALCIUM  9.0 06/10/2024 0701   EGFR 55 (L) 10/22/2023 1114   GFRNONAA 56 (L) 06/10/2024 0701   GFRNONAA 48 (L) 06/18/2020 0959    IMAGING past 24 hours No results found.   Vitals:   06/10/24 0900 06/10/24 1000 06/10/24 1100 06/10/24 1200  BP: (!) 123/91 (!) 147/65 (!) 151/69 (!) 170/67  Pulse: 93 75 85 89  Resp: 20 (!) 22 (!) 21 (!) 28  Temp:      TempSrc:      SpO2: 98% 96% 96% 98%  Weight:      Height:         PHYSICAL  EXAM General:  Alert, well-nourished, well-developed patient in no acute distress Psych:  Mood and affect appropriate for situation CV: Regular rate and rhythm on monitor Respiratory:  Regular, unlabored respirations on room air GI: Abdomen soft and nontender   NEURO:  Mental Status: Awake and alert, oriented to age, place, month/year. Speech/Language: No dysarthria or aphasia.  Follows all simple commands she is able to name objects and repeat sentences. Cranial Nerves:  II: PERRL.  VFF on right, inconsistent left visual field testing III, IV, VI: EOMI. no gaze palsy, no nystagmus. V: Sensation is intact to light touch and symmetrical to face.  VII: Face is symmetrical resting and smiling VIII: hearing intact to voice. IX, X: Palate elevates symmetrically. Phonation is normal.  YN:WGNFAOZH shrug 5/5. XII: tongue protrusion midline Motor: 5/5 strength to all muscle groups tested.  Tone: is normal and bulk is normal Sensation-positive symmetric withdraw in all extremities Coordination: Slight dysmetria left FTN testing Gait- deferred  Most Recent NIH: 4.    ASSESSMENT/PLAN  Ms. Yannis Gumbs is a 78 y.o. female with history of HLD, pulmonary fibrosis who presented as a code stroke 6/10 due to acute onset of aphasia and altered mental status, she  also complained of a headache on the right side since 6/9 PM.  CT head showed right temporal IPH and she was admitted for full stroke workup.  NIH on Admission: 4..  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 Surrounding edema without significant regional mass effect. May repeat MRI when patient able  to remain still  CT repeat stable hematoma CT head and neck bilateral ICA bulb atherosclerosis, no AVM or aneurysm CTV no dural venous sinus thrombosis Repeat CT 6/11 pm: Stable MRI with contrast negative for abnormal enhancement 2D Echo: 60-65% LDL 53 HgbA1c 5.6 VTE prophylaxis - SCDs No antithrombotic prior to admission, continue No antithrombotic due to ICH. Therapy recommendations:  CIR Disposition:  pending   ?Contrast-induced encephalopathy Agitation/Restlessness, resolved Increased agitation post-CTA/CTV 6/11 Given Haldol /Ativan  IM->ativan  IV->Started on Precedex  Repeat CT stable PTA medications reviewed, none with withdrawal worry Pt denies alcohol UA negative Add seroquel  50mg  at bedtime Add Haldol  IM Q6H PRN Precedex  off Off IV bolus and IVF  Hypertension Home meds:  none Stable slightly hypertensive will start amlodipine  10mg   Labetalol  PRN Q2H BP goal less than 160 Long-term BP goal normotensive  Hyperlipidemia Home meds:  Zetia  10mg  and Crestor  20mg  (3 times a week) LDL 53, goal < 70 Hold off statin for now Pt and daughter are interested in [facility] trial--informed consent provided to pt to read  Tobacco Abuse Patient smokes a pack per day for >50 years      Ready to quit? No Nicotine  replacement therapy provided Continue cessation education  Hypokalemia  K 3.3 will replace today and repeat in am   Other Active Problems Urinary Retention Straight Cath *2 performed Ok for foley with next in&out needed UA today negative COPD CKD stage 3, creatinine 1.16--1.03--1.08-0.84 Anxiety Pulmonary Fibrosis, currently enrolled in [facility] trial    Hospital day # 3   Pt seen by Neuro NP/APP with MD. Note/plan to be edited by MD as needed.    Jonette Nestle DNP, ACNPC-AG  [facility]  Neurohospitalist  ATTENDING NOTE: I reviewed above note and agree with the assessment and plan. Pt was seen and examined.   No family at the bedside. Pt awake alert,  orientated, calm without encephalopathy, neuro intact. Much improved from yesterday. MRI without contrast negative. Discussed with [facility] trial, pt is interested. IC given to read. Transfer out of ICU  For detailed assessment and plan, please refer to above as I have made changes wherever appropriate.   Consuelo Denmark, MD PhD Stroke Neurology 06/10/2024 9:07 PM  This patient is critically ill due to ICH, hypertensive emergency, pulmonary fibrosis and at significant risk of neurological worsening, death form hematoma expansion, brain herniation, hypertensive encephalopathy, respiratory failure. This patient's care requires constant monitoring of vital signs, hemodynamics, respiratory and cardiac monitoring, review of multiple databases, neurological assessment, discussion with family, other specialists and medical decision making of high complexity. I spent 35 minutes of neurocritical care time in the care of this patient.

## 2024-06-10 NOTE — Progress Notes (Signed)
 Inpatient Rehab Admissions Coordinator:   Rescreened from yesterday's session.  Unfortunately she is mobilizing too well to secure [facility] Medicare auth for CIR.  Would recommend alternative rehab options be pursued.    Loye Rumble, PT, DPT Admissions Coordinator [phone] 06/10/24  2:08 PM

## 2024-06-10 NOTE — Progress Notes (Signed)
 Alison Galley, NP gave order that patient may travel to MRI without nurse.

## 2024-06-10 NOTE — Plan of Care (Signed)
  Problem: Education: Goal: Knowledge of disease or condition will improve Outcome: Progressing Goal: Knowledge of secondary prevention will improve (MUST DOCUMENT ALL) Outcome: Progressing Goal: Knowledge of patient specific risk factors will improve (DELETE if not current risk factor) Outcome: Progressing   Problem: Intracerebral Hemorrhage Tissue Perfusion: Goal: Complications of Intracerebral Hemorrhage will be minimized Outcome: Progressing   Problem: Coping: Goal: Will verbalize positive feelings about self Outcome: Progressing Goal: Will identify appropriate support needs Outcome: Progressing   Problem: Health Behavior/Discharge Planning: Goal: Ability to manage health-related needs will improve Outcome: Progressing Goal: Goals will be collaboratively established with patient/family Outcome: Progressing   Problem: Self-Care: Goal: Ability to participate in self-care as condition permits will improve Outcome: Progressing Goal: Verbalization of feelings and concerns over difficulty with self-care will improve Outcome: Progressing Goal: Ability to communicate needs accurately will improve Outcome: Progressing   Problem: Nutrition: Goal: Risk of aspiration will decrease Outcome: Progressing Goal: Dietary intake will improve Outcome: Progressing   Problem: Education: Goal: Knowledge of General Education information will improve Description: Including pain rating scale, medication(s)/side effects and non-pharmacologic comfort measures Outcome: Progressing   Problem: Health Behavior/Discharge Planning: Goal: Ability to manage health-related needs will improve Outcome: Progressing   Problem: Clinical Measurements: Goal: Ability to maintain clinical measurements within normal limits will improve Outcome: Progressing Goal: Will remain free from infection Outcome: Progressing Goal: Diagnostic test results will improve Outcome: Progressing Goal: Respiratory  complications will improve Outcome: Progressing Goal: Cardiovascular complication will be avoided Outcome: Progressing   Problem: Activity: Goal: Risk for activity intolerance will decrease Outcome: Progressing   Problem: Nutrition: Goal: Adequate nutrition will be maintained Outcome: Progressing   Problem: Coping: Goal: Level of anxiety will decrease Outcome: Progressing   Problem: Elimination: Goal: Will not experience complications related to bowel motility Outcome: Progressing Goal: Will not experience complications related to urinary retention Outcome: Progressing   Problem: Pain Managment: Goal: General experience of comfort will improve and/or be controlled Outcome: Progressing   Problem: Safety: Goal: Ability to remain free from injury will improve Outcome: Progressing   Problem: Skin Integrity: Goal: Risk for impaired skin integrity will decrease Outcome: Progressing   Problem: Safety: Goal: Non-violent Restraint(s) Outcome: Progressing

## 2024-06-10 NOTE — Progress Notes (Signed)
 Patient arrived to 3W18 at 1800 from [facility]North ICU. GCS 15 on room air stable vitals. Bed in lowest position with call bell in reach. Bed alarm on. Skin assessment complete. Patient eating dinner.

## 2024-06-10 NOTE — TOC Initial Note (Signed)
 Transition of Care [facility]) - Initial/Assessment Note    Patient Details  Name: Alison Campbell MRN: [id number] Date of Birth: 06-15-1946  [facility] CM/SW Contact:    Jannice Mends, LCSW Phone Number: 06/10/2024, 5:14 PM  Clinical Narrative:                 CSW contacted patient's daughter but went to voicemail. CSW to follow up regarding discharge options. Per CIR, patient mobilizing too well. Unsure if SNF would be approved but could attempt if family is interested.     Barriers to Discharge: Continued Medical Work up, English as a second language teacher   Patient Goals and CMS Choice            Expected Discharge Plan and Services In-house Referral: Clinical Social Work     Living arrangements for the past 2 months: Single Family Home                                      Prior Living Arrangements/Services Living arrangements for the past 2 months: Single Family Home   Patient language and need for interpreter reviewed:: Yes Do you feel safe going back to the place where you live?: Yes      Need for Family Participation in Patient Care: Yes (Comment) Care giver support system in place?: Yes (comment)   Criminal Activity/Legal Involvement Pertinent to Current Situation/Hospitalization: No - Comment as needed  Activities of Daily Living   ADL Screening (condition at time of admission) Independently performs ADLs?: No Does the patient have a NEW difficulty with bathing/dressing/toileting/self-feeding that is expected to last >3 days?: No Does the patient have a NEW difficulty with getting in/out of bed, walking, or climbing stairs that is expected to last >3 days?: No Does the patient have a NEW difficulty with communication that is expected to last >3 days?: Yes (Initiates electronic notice to provider for possible SLP consult) Is the patient deaf or have difficulty hearing?: No Does the patient have difficulty seeing, even when wearing glasses/contacts?:  No Does the patient have difficulty concentrating, remembering, or making decisions?: Yes  Permission Sought/Granted Permission sought to share information with : Facility Medical sales representative, Family Supports Permission granted to share information with : Yes, Verbal Permission Granted  Share Information with NAME: [name]     Permission granted to share info w Relationship: Daughter  Permission granted to share info w Contact Information: [phone]  Emotional Assessment Appearance:: Appears stated age       Alcohol / Substance Use: Not Applicable Psych Involvement: No (comment)  Admission diagnosis:  ICH (intracerebral hemorrhage) (HCC) [I61.9] Patient Active Problem List   Diagnosis Date Noted   ICH (intracerebral hemorrhage) (HCC) 06/07/2024   Right patella fracture 10/30/2023   ILD (interstitial lung disease) (HCC) 08/15/2022   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/14/2022   BMI 25.0-25.9,adult 12/24/2020   CKD (chronic kidney disease) stage 3, GFR 30-59 ml/min (HCC) 12/24/2020   Insomnia    Dyshidrotic foot dermatitis    Aortic atherosclerosis (HCC) by CT Lung Scan in July 2021 03/11/2018   Tobacco use disorder 06/21/2015   Osteoporosis 06/21/2015   Essential hypertension 11/07/2013   Hyperlipidemia, mixed 11/07/2013   Abnormal glucose 11/07/2013   Chronic bronchitis (HCC) 11/07/2013   Vitamin D  deficiency 11/07/2013   Diverticulosis of large intestine 09/10/2011   History of colon polyps 09/10/2011   PCP:  Patient, No Pcp Per Pharmacy:  [id number] [place] Nestle, [place] - [address] AT [facility] [address] [facility] [address] [place] [address] Phone: [phone] Fax: [phone]  [place] [facility] [address] [place] [address] Phone: [phone] Fax: [phone]  [id number] [place], [place] - [address] [place] [address] Phone: [phone]  Fax: [phone]     Social Drivers of Health (SDOH) Social History: SDOH Screenings   Food Insecurity: No Food Insecurity (06/07/2024)  Housing: Low Risk  (06/07/2024)  Transportation Needs: No Transportation Needs (06/07/2024)  Utilities: Not At Risk (06/07/2024)  Depression (PHQ2-9): Low Risk  (10/25/2023)  Social Connections: Moderately Integrated (06/07/2024)  Tobacco Use: High Risk (06/07/2024)   SDOH Interventions:     Readmission Risk Interventions     No data to display

## 2024-06-11 LAB — CBC
HCT: 37.5 % (ref 36.0–46.0)
Hemoglobin: 12.1 g/dL (ref 12.0–15.0)
MCH: 30.1 pg (ref 26.0–34.0)
MCHC: 32.3 g/dL (ref 30.0–36.0)
MCV: 93.3 fL (ref 80.0–100.0)
Platelets: 235 10*3/uL (ref 150–400)
RBC: 4.02 MIL/uL (ref 3.87–5.11)
RDW: 13.4 % (ref 11.5–15.5)
WBC: 7.2 10*3/uL (ref 4.0–10.5)
nRBC: 0 % (ref 0.0–0.2)

## 2024-06-11 LAB — BASIC METABOLIC PANEL WITH GFR
Anion gap: 8 (ref 5–15)
BUN: 28 mg/dL — ABNORMAL HIGH (ref 8–23)
CO2: 22 mmol/L (ref 22–32)
Calcium: 9 mg/dL (ref 8.9–10.3)
Chloride: 111 mmol/L (ref 98–111)
Creatinine, Ser: 1.02 mg/dL — ABNORMAL HIGH (ref 0.44–1.00)
GFR, Estimated: 56 mL/min — ABNORMAL LOW (ref >60)
Glucose, Bld: 94 mg/dL (ref 70–99)
Potassium: 3.6 mmol/L (ref 3.5–5.1)
Sodium: 141 mmol/L (ref 135–145)

## 2024-06-11 MED ORDER — ALBUTEROL SULFATE (2.5 MG/3ML) 0.083% IN NEBU: 2.5000 mg | INHALATION_SOLUTION | Freq: Four times a day (QID) | RESPIRATORY_TRACT | Status: DC | PRN

## 2024-06-11 NOTE — Plan of Care (Signed)
  Problem: Education: Goal: Knowledge of disease or condition will improve Outcome: Progressing Goal: Knowledge of secondary prevention will improve (MUST DOCUMENT ALL) Outcome: Progressing Goal: Knowledge of patient specific risk factors will improve (DELETE if not current risk factor) Outcome: Progressing   Problem: Intracerebral Hemorrhage Tissue Perfusion: Goal: Complications of Intracerebral Hemorrhage will be minimized Outcome: Progressing   Problem: Coping: Goal: Will verbalize positive feelings about self Outcome: Progressing Goal: Will identify appropriate support needs Outcome: Progressing   Problem: Health Behavior/Discharge Planning: Goal: Ability to manage health-related needs will improve Outcome: Progressing Goal: Goals will be collaboratively established with patient/family Outcome: Progressing   Problem: Self-Care: Goal: Ability to participate in self-care as condition permits will improve Outcome: Progressing Goal: Verbalization of feelings and concerns over difficulty with self-care will improve Outcome: Progressing Goal: Ability to communicate needs accurately will improve Outcome: Progressing

## 2024-06-11 NOTE — Progress Notes (Signed)
 PROGRESS NOTE    Alison Campbell  ZOX:[id number] DOB: [birth year] DOA: 06/07/2024 PCP: Patient, No Pcp Per   Brief Narrative: 78 year old with past medical hyperlipidemia, pulmonary fibrosis, CKD stage III, prediabetes, COPD, anxiety who presented on 6/10 with aphasia evaluation in the ED CT head revealed acute right temporal lobe hemorrhage.  Patient was admitted by neurology.  Repeated CT 6/11 showed stable hematoma without extension or other acute abnormality.  CTA head and neck no large vessel occlusion or AVM or aneurysmal.  CTP no venous sinus thrombosis.  Patient became agitated post CT.  Found to have some contrast-induced encephalopathy.  EEG was negative.  Patient was on Precedex .  Subsequently Precedex  was discontinue because of oversedation.  Has remained stable she was transferred out of the ICU and her care has been transferred to [facility] starting 6/14.   Assessment & Plan:   Principal Problem:   ICH (intracerebral hemorrhage) (HCC)   1-ICH, posterior right temporal small ICH etiology unclear. - CT head: 2.5 x 2.3 x 1.8 cm (5.2 mL) acute parenchymal hemorrhage within the posterior right temporal lobe with mild surrounding edema - MRI/MRA brain: Grossly stable size and appearance of intraparenchymal hemorrhage involving the right temporal lobe. Surrounding edema without significant regional mass effect. - Repeated CT head hematoma stable. Grossly negative intracranial MRA  - CT venogram venous thrombosis. - MRI with contrast was negative for abnormal enhancement. - 2D echo ejection fraction 60 to 65%, LDL 63, A1c 5.6 No antithrombotic due to ICH.   Questionable contrast-induced encephalopathy Agitation/restlessness resolved -Developed increased agitation post CTA CTP 6/11 - Received Haldol /Ativan  and subsequently was on Precedex  drip.  Now off -.  CT at that time was stable.  -improved, alert and conversant.   Hypertension: Pressure goal less than  160 -Started  on amlodipine   Hyperlipidemia: Zetia  and Crestor  (3 times a week.) Currently holding statins.    Tobacco abuse;  -Nicotine   patch.   Hypokalemia -Replaced.   Urinary retention Required In and out times two.  Monitor.   COPD: PRN Albuterol   CKD stage IIIa: cr stable/   Anxiety Seroquel  at hs  Pulmonary fibrosis Stable. RA  Estimated body mass index is 24.53 kg/m as calculated from the following:   Height as of this encounter: 5' 3 (1.6 m).   Weight as of this encounter: 62.8 kg.   DVT prophylaxis: Heparin  Code Status: Full cdoe Family Communication: Disposition Plan:  Status is: Inpatient Remains inpatient appropriate because: rehab    Consultants:  Neurology     Antimicrobials:    Subjective: She is alert, eating breakfast, no complaints. She agrees to go to rehab for couple of weeks.   Objective: Vitals:   06/10/24 2044 06/11/24 0011 06/11/24 0050 06/11/24 0437  BP: (!) 139/57 (!) 175/65 (!) 128/58 (!) 124/91  Pulse: 90 91 76 73  Resp:  (!) 21  15  Temp:  97.9 F (36.6 C)  98.1 F (36.7 C)  TempSrc:  Oral  Oral  SpO2: 96% 94% 97% 97%  Weight:      Height:        Intake/Output Summary (Last 24 hours) at 06/11/2024 0721 Last data filed at 06/10/2024 1900 Gross per 24 hour  Intake 960 ml  Output --  Net 960 ml   Filed Weights   06/07/24 1520 06/07/24 1603 06/07/24 1630  Weight: 60.2 kg 60.2 kg 62.8 kg    Examination:  General exam: Appears calm and comfortable  Respiratory system: Clear to auscultation. Respiratory effort  normal. Cardiovascular system: S1 & S2 heard, RRR. No JVD, murmurs, rubs, gallops or clicks. No pedal edema. Gastrointestinal system: Abdomen is nondistended, soft and nontender. No organomegaly or masses felt. Normal bowel sounds heard. Central nervous system: Alert and calm, answer questions.  Extremities: Symmetric 5 x 5 power.   Data Reviewed: I have personally reviewed following labs and  imaging studies  CBC: Recent Labs  Lab 06/07/24 1516 06/07/24 1526 06/07/24 2324 06/08/24 0841 06/09/24 0558 06/10/24 0701 06/11/24 0550  WBC 8.6  --  9.8 10.7* 9.9 8.5 7.2  NEUTROABS 5.4  --   --  8.1*  --   --   --   HGB 13.4   < > 13.5 13.3 12.9 11.9* 12.1  HCT 40.8   < > 41.0 40.6 39.2 36.7 37.5  MCV 93.8  --  93.0 92.3 92.9 93.9 93.3  PLT 284  --  259 268 217 232 235   < > = values in this interval not displayed.   Basic Metabolic Panel: Recent Labs  Lab 06/07/24 2324 06/08/24 0841 06/09/24 0558 06/10/24 0701 06/11/24 0550  NA 139 139 139 142 141  K 3.5 3.7 3.8 3.3* 3.6  CL 108 108 109 112* 111  CO2 23 23 21* 21* 22  GLUCOSE 100* 96 121* 81 94  BUN 18 17 20 23  28*  CREATININE 1.03* 1.08* 0.84 1.02* 1.02*  CALCIUM  9.3 9.4 9.1 9.0 9.0  MG  --  2.0  --   --   --    GFR: Estimated Creatinine Clearance: 36.4 mL/min (A) (by C-G formula based on SCr of 1.02 mg/dL (H)). Liver Function Tests: Recent Labs  Lab 06/07/24 1516 06/07/24 2324 06/09/24 0558  AST 17 17 19   ALT 18 19 16   ALKPHOS 67 53 46  BILITOT 0.6 0.4 0.5  PROT 6.5 6.4* 6.5  ALBUMIN 3.4* 3.4* 3.5   No results for input(s): LIPASE, AMYLASE in the last 168 hours. No results for input(s): AMMONIA in the last 168 hours. Coagulation Profile: Recent Labs  Lab 06/07/24 1516  INR 0.9   Cardiac Enzymes: No results for input(s): CKTOTAL, CKMB, CKMBINDEX, TROPONINI in the last 168 hours. BNP (last 3 results) No results for input(s): PROBNP in the last 8760 hours. HbA1C: No results for input(s): HGBA1C in the last 72 hours. CBG: Recent Labs  Lab 06/07/24 1516  GLUCAP 81   Lipid Profile: No results for input(s): CHOL, HDL, LDLCALC, TRIG, CHOLHDL, LDLDIRECT in the last 72 hours. Thyroid  Function Tests: No results for input(s): TSH, T4TOTAL, FREET4, T3FREE, THYROIDAB in the last 72 hours. Anemia Panel: No results for input(s): VITAMINB12, FOLATE,  FERRITIN, TIBC, IRON, RETICCTPCT in the last 72 hours. Sepsis Labs: No results for input(s): PROCALCITON, LATICACIDVEN in the last 168 hours.  Recent Results (from the past 240 hours)  MRSA Next Gen by PCR, Nasal     Status: None   Collection Time: 06/07/24  4:39 PM   Specimen: Nasal Mucosa; Nasal Swab  Result Value Ref Range Status   MRSA by PCR Next Gen NOT DETECTED NOT DETECTED Final    Comment: (NOTE) The GeneXpert MRSA Assay (FDA approved for NASAL specimens only), is one component of a comprehensive MRSA colonization surveillance program. It is not intended to diagnose MRSA infection nor to guide or monitor treatment for MRSA infections. Test performance is not FDA approved in patients less than 97 years old. Performed at [facility], [address] [place], [place] [address]  Radiology Studies: MR BRAIN W CONTRAST Result Date: 06/10/2024 CLINICAL DATA:  Follow-up examination for hemorrhagic stroke. EXAM: MRI HEAD WITH CONTRAST TECHNIQUE: Multiplanar, multiecho pulse sequences of the brain and surrounding structures were obtained with intravenous contrast. CONTRAST:  6mL GADAVIST GADOBUTROL 1 MMOL/ML IV SOLN COMPARISON:  CT from 06/08/2024 as well as earlier studies. FINDINGS: Brain: Postcontrast imaging of the brain demonstrates no mass lesion or abnormal enhancement underlying the right temporal hemorrhage. Hemorrhage itself is relatively similar in size. No significant regional mass effect or midline shift. No other abnormal enhancement within the brain. No other new or progressive finding on this limited study. Vascular: Not well assessed on this limited exam. Skull and upper cervical spine: No new finding. Sinuses/Orbits: No new finding. Other: None. IMPRESSION: Negative postcontrast MRI of the brain. No mass lesion or abnormal enhancement underlying the right temporal hemorrhage. Electronically Signed   By: Virgia Griffins M.D.   On: 06/10/2024  19:40        Scheduled Meds:  amLODipine   10 mg Oral Daily   Chlorhexidine  Gluconate Cloth  6 each Topical Daily   feeding supplement  237 mL Oral BID BM   heparin  injection (subcutaneous)  5,000 Units Subcutaneous Q8H   nicotine   14 mg Transdermal Daily   pantoprazole   40 mg Oral QHS   QUEtiapine   50 mg Oral QHS   senna-docusate  1 tablet Oral BID   Continuous Infusions:   LOS: 4 days    Time spent: 35 minutes   Jaicob Dia A Marqueze Ramcharan, MD [facility]   If 7PM-7AM, please contact night-coverage [email or web address]  06/11/2024, 7:21 AM

## 2024-06-11 NOTE — Plan of Care (Signed)
  Problem: Intracerebral Hemorrhage Tissue Perfusion: Goal: Complications of Intracerebral Hemorrhage will be minimized 06/11/2024 0604 by Francena Infield, RN Outcome: Progressing 06/11/2024 0534 by Francena Infield, RN Outcome: Progressing   Problem: Education: Goal: Knowledge of disease or condition will improve 06/11/2024 0604 by Francena Infield, RN Outcome: Progressing 06/11/2024 0534 by Francena Infield, RN Outcome: Progressing Goal: Knowledge of secondary prevention will improve (MUST DOCUMENT ALL) 06/11/2024 0604 by Francena Infield, RN Outcome: Progressing 06/11/2024 0534 by Francena Infield, RN Outcome: Progressing Goal: Knowledge of patient specific risk factors will improve (DELETE if not current risk factor) 06/11/2024 0604 by Francena Infield, RN Outcome: Progressing 06/11/2024 0534 by Francena Infield, RN Outcome: Progressing   Problem: Self-Care: Goal: Ability to participate in self-care as condition permits will improve 06/11/2024 0604 by Francena Infield, RN Outcome: Progressing 06/11/2024 0534 by Francena Infield, RN Outcome: Progressing Goal: Verbalization of feelings and concerns over difficulty with self-care will improve 06/11/2024 0604 by Francena Infield, RN Outcome: Progressing 06/11/2024 0534 by Francena Infield, RN Outcome: Progressing Goal: Ability to communicate needs accurately will improve 06/11/2024 0604 by Francena Infield, RN Outcome: Progressing 06/11/2024 0534 by Francena Infield, RN Outcome: Progressing

## 2024-06-11 NOTE — Progress Notes (Addendum)
 STROKE TEAM PROGRESS NOTE    SIGNIFICANT HOSPITAL EVENTS  6/10: presented w. Aphasia, headache  CTH: Acute R temporal ICH 6/11: CT stable hematoma without extension or other acute abnormality. CTA head and neck no LVO or AVM or aneurysm, CTV no venous sinus thrombosis.  Patient became agitated post-CT. Concern for contrast-induced encephalopathy. EEG negative. Placed on Precedex .  On-call Neurologist notified of increased somnolence around 2200, Precedex  held.  STAT CT stable.   INTERIM HISTORY/SUBJECTIVE  No family at the bedside.  Patient is sitting up in bed in no apparent distress.  Neurological exam remains stable and unchanged Labs and vitals are stable She is waiting for available bed on rehab  CBC    Component Value Date/Time   WBC 7.2 06/11/2024 0550   RBC 4.02 06/11/2024 0550   HGB 12.1 06/11/2024 0550   HCT 37.5 06/11/2024 0550   PLT 235 06/11/2024 0550   MCV 93.3 06/11/2024 0550   MCH 30.1 06/11/2024 0550   MCHC 32.3 06/11/2024 0550   RDW 13.4 06/11/2024 0550   LYMPHSABS 1.6 06/08/2024 0841   MONOABS 0.7 06/08/2024 0841   EOSABS 0.2 06/08/2024 0841   BASOSABS 0.1 06/08/2024 0841    BMET    Component Value Date/Time   NA 141 06/11/2024 0550   K 3.6 06/11/2024 0550   CL 111 06/11/2024 0550   CO2 22 06/11/2024 0550   GLUCOSE 94 06/11/2024 0550   BUN 28 (H) 06/11/2024 0550   CREATININE 1.02 (H) 06/11/2024 0550   CREATININE 1.03 (H) 10/22/2023 1114   CALCIUM  9.0 06/11/2024 0550   EGFR 55 (L) 10/22/2023 1114   GFRNONAA 56 (L) 06/11/2024 0550   GFRNONAA 48 (L) 06/18/2020 0959    IMAGING past 24 hours MR BRAIN W CONTRAST Result Date: 06/10/2024 CLINICAL DATA:  Follow-up examination for hemorrhagic stroke. EXAM: MRI HEAD WITH CONTRAST TECHNIQUE: Multiplanar, multiecho pulse sequences of the brain and surrounding structures were obtained with intravenous contrast. CONTRAST:  6mL GADAVIST GADOBUTROL 1 MMOL/ML IV SOLN COMPARISON:  CT from 06/08/2024 as well as  earlier studies. FINDINGS: Brain: Postcontrast imaging of the brain demonstrates no mass lesion or abnormal enhancement underlying the right temporal hemorrhage. Hemorrhage itself is relatively similar in size. No significant regional mass effect or midline shift. No other abnormal enhancement within the brain. No other new or progressive finding on this limited study. Vascular: Not well assessed on this limited exam. Skull and upper cervical spine: No new finding. Sinuses/Orbits: No new finding. Other: None. IMPRESSION: Negative postcontrast MRI of the brain. No mass lesion or abnormal enhancement underlying the right temporal hemorrhage. Electronically Signed   By: Virgia Griffins M.D.   On: 06/10/2024 19:40     Vitals:   06/11/24 0011 06/11/24 0050 06/11/24 0437 06/11/24 0845  BP: (!) 175/65 (!) 128/58 (!) 124/91 (!) 160/67  Pulse: 91 76 73 78  Resp: (!) 21  15 16   Temp: 97.9 F (36.6 C)  98.1 F (36.7 C) 98.1 F (36.7 C)  TempSrc: Oral  Oral Oral  SpO2: 94% 97% 97% 95%  Weight:      Height:         PHYSICAL EXAM General:  Alert, well-nourished, well-developed patient in no acute distress Psych:  Mood and affect appropriate for situation CV: Regular rate and rhythm on monitor Respiratory:  Regular, unlabored respirations on room air GI: Abdomen soft and nontender   NEURO:  Mental Status: Awake and alert, oriented to age, place, month/year. Speech/Language: No dysarthria or aphasia.  Follows all simple commands she is able to name objects and repeat sentences. Cranial Nerves:  II: PERRL.  VFF on right, inconsistent left visual field testing III, IV, VI: EOMI. no gaze palsy, no nystagmus. V: Sensation is intact to light touch and symmetrical to face.  VII: Face is symmetrical resting and smiling VIII: hearing intact to voice. IX, X: Palate elevates symmetrically. Phonation is normal.  WU:[id number] shrug 5/5. XII: tongue deviates to the right Motor: 5/5 strength to all  muscle groups tested.  Tone: is normal and bulk is normal Sensation-positive symmetric withdraw in all extremities Coordination: Slight dysmetria left FTN testing Gait- deferred  Most Recent NIH: 4.    ASSESSMENT/PLAN  [name] is a 78 y.o. female with history of HLD, pulmonary fibrosis who presented as a code stroke 6/10 due to acute onset of aphasia and altered mental status, she also complained of a headache on the right side since 6/9 PM.  CT head showed right temporal IPH and she was admitted for full stroke workup.  NIH on Admission: 4..  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 Surrounding edema without significant regional mass effect. May repeat MRI when patient able to remain still  CT repeat stable hematoma CT head and neck bilateral ICA bulb atherosclerosis, no AVM or aneurysm CTV no dural venous sinus thrombosis Repeat CT 6/11 pm: Stable MRI with contrast negative for abnormal enhancement 2D Echo: 60-65% LDL 53 HgbA1c 5.6 VTE prophylaxis - SCDs No antithrombotic prior to admission, continue No antithrombotic due to ICH. Will need outpatient follow-up with neurology in 8 weeks Therapy recommendations:  CIR Disposition:  pending   ?Contrast-induced encephalopathy Agitation/Restlessness, resolved Increased agitation post-CTA/CTV 6/11 Given Haldol /Ativan  IM->ativan  IV->Started on Precedex  Repeat CT stable PTA medications reviewed, none with withdrawal worry Pt denies alcohol UA negative Add seroquel  50mg  at bedtime Add Haldol  IM Q6H PRN Precedex  off Off IV bolus and IVF  Hypertension Home meds:  none Stable  slightly hypertensive will start amlodipine  10mg   Labetalol  PRN Q2H BP goal less than 160 Long-term BP goal normotensive  Hyperlipidemia Home meds:  Zetia  10mg  and Crestor  20mg  (3 times a week) LDL 53, goal < 70 Hold off statin for now Pt and daughter are interested in [facility] trial--informed consent provided to pt to read  Tobacco Abuse Patient smokes a pack per day for >50 years      Ready to quit? No Nicotine  replacement therapy provided Continue cessation education  Hypokalemia  K 3.3 will replace today and repeat in am   Other Active Problems Urinary Retention Straight Cath *2 performed Ok for foley with next in&out needed UA today negative COPD CKD stage 3, creatinine 1.16--1.03--1.08-0.84 Anxiety Pulmonary Fibrosis, currently enrolled in [facility] trial    Hospital day # 4  Neurology will sign off please call with questions or concerns  Pt seen by Neuro NP/APP with MD. Note/plan to be edited by MD as needed.    Jonette Nestle DNP, ACNPC-AG  [facility]     ATTENDING NOTE: I reviewed above note and agree with the assessment and plan. Pt was seen and examined. Doing well, awaiting rehab bed. Will sign off, please do not hesitate the contact us   for any additional questions or concerns. I spent a total of 25 minutes dedicated to the care of this patient.   For detailed assessment and plan, please refer to above as I have made changes wherever appropriate.     Cassandra Cleveland, MD  Neurology 06/11/2024 12:03 PM

## 2024-06-11 NOTE — Plan of Care (Signed)
   Problem: Safety: Goal: Ability to remain free from injury will improve Outcome: Progressing   Problem: Skin Integrity: Goal: Risk for impaired skin integrity will decrease Outcome: Progressing   Problem: Elimination: Goal: Will not experience complications related to bowel motility Outcome: Progressing Goal: Will not experience complications related to urinary retention Outcome: Progressing   Problem: Nutrition: Goal: Adequate nutrition will be maintained Outcome: Progressing   Problem: Activity: Goal: Risk for activity intolerance will decrease Outcome: Progressing   Problem: Clinical Measurements: Goal: Ability to maintain clinical measurements within normal limits will improve Outcome: Progressing Goal: Will remain free from infection Outcome: Progressing Goal: Diagnostic test results will improve Outcome: Progressing Goal: Respiratory complications will improve Outcome: Progressing Goal: Cardiovascular complication will be avoided Outcome: Progressing

## 2024-06-12 DIAGNOSIS — I611 Nontraumatic intracerebral hemorrhage in hemisphere, cortical: Secondary | ICD-10-CM | POA: Diagnosis not present

## 2024-06-12 LAB — BASIC METABOLIC PANEL WITH GFR
Anion gap: 10 (ref 5–15)
BUN: 24 mg/dL — ABNORMAL HIGH (ref 8–23)
CO2: 24 mmol/L (ref 22–32)
Calcium: 9.4 mg/dL (ref 8.9–10.3)
Chloride: 106 mmol/L (ref 98–111)
Creatinine, Ser: 1 mg/dL (ref 0.44–1.00)
GFR, Estimated: 57 mL/min — ABNORMAL LOW (ref >60)
Glucose, Bld: 95 mg/dL (ref 70–99)
Potassium: 3.6 mmol/L (ref 3.5–5.1)
Sodium: 140 mmol/L (ref 135–145)

## 2024-06-12 LAB — CBC
HCT: 38.7 % (ref 36.0–46.0)
Hemoglobin: 12.8 g/dL (ref 12.0–15.0)
MCH: 31.1 pg (ref 26.0–34.0)
MCHC: 33.1 g/dL (ref 30.0–36.0)
MCV: 93.9 fL (ref 80.0–100.0)
Platelets: 239 10*3/uL (ref 150–400)
RBC: 4.12 MIL/uL (ref 3.87–5.11)
RDW: 13.7 % (ref 11.5–15.5)
WBC: 7.4 10*3/uL (ref 4.0–10.5)
nRBC: 0 % (ref 0.0–0.2)

## 2024-06-12 NOTE — Progress Notes (Signed)
 PROGRESS NOTE    Alison Campbell  OZH:[id number] DOB: December 23, 1946 DOA: 06/07/2024 PCP: Patient, No Pcp Per   Brief Narrative: 78 year old with past medical hyperlipidemia, pulmonary fibrosis, CKD stage III, prediabetes, COPD, anxiety who presented on 6/10 with aphasia evaluation in the ED CT head revealed acute right temporal lobe hemorrhage.  Patient was admitted by neurology.  Repeated CT 6/11 showed stable hematoma without extension or other acute abnormality.  CTA head and neck no large vessel occlusion or AVM or aneurysmal.  CTP no venous sinus thrombosis.  Patient became agitated post CT.  Found to have some contrast-induced encephalopathy.  EEG was negative.  Patient was on Precedex .  Subsequently Precedex  was discontinue because of oversedation.  Has remained stable she was transferred out of the ICU and her care has been transferred to [facility] starting 6/14.   Assessment & Plan:   Principal Problem:   ICH (intracerebral hemorrhage) (HCC)   1-ICH, posterior right temporal small ICH etiology unclear. - CT head: 2.5 x 2.3 x 1.8 cm (5.2 mL) acute parenchymal hemorrhage within the posterior right temporal lobe with mild surrounding edema - MRI/MRA brain: Grossly stable size and appearance of intraparenchymal hemorrhage involving the right temporal lobe. Surrounding edema without significant regional mass effect. - Repeated CT head hematoma stable. Grossly negative intracranial MRA  - CT venogram venous thrombosis. - MRI with contrast was negative for abnormal enhancement. - 2D echo ejection fraction 60 to 65%, LDL 63, A1c 5.6 No antithrombotic due to ICH. Stable. Awaiting rehab.    Questionable contrast-induced encephalopathy Agitation/restlessness resolved -Developed increased agitation post CTA CTP 6/11 - Received Haldol /Ativan  and subsequently was on Precedex  drip.  Now off -. CT at that time was stable.  -Improved, alert and conversant.   Hypertension: Pressure  goal less than 160 -Started  on amlodipine   Hyperlipidemia: Zetia  and Crestor  (3 times a week.) Currently holding statins.    Tobacco abuse;  -Nicotine   patch.   Hypokalemia -Replaced.   Urinary retention Required In and out times two.  Monitor.   COPD: PRN Albuterol   CKD stage IIIa: cr stable/   Anxiety Seroquel  at hs  Pulmonary fibrosis Stable. RA  Estimated body mass index is 24.53 kg/m as calculated from the following:   Height as of this encounter: 5' 3 (1.6 m).   Weight as of this encounter: 62.8 kg.   DVT prophylaxis: Heparin  Code Status: Full cdoe Family Communication:patient updated. She politely decline that I update family Disposition Plan:  Status is: Inpatient Remains inpatient appropriate because: rehab    Consultants:  Neurology     Antimicrobials:    Subjective: She is alert, conversant, answer questions appropriately  Objective: Vitals:   06/11/24 2013 06/11/24 2341 06/12/24 0357 06/12/24 0743  BP: (!) 122/96 (!) 137/55 (!) 148/68 (!) 141/72  Pulse: 83 79 78 88  Resp: 16  16 19   Temp: 98.1 F (36.7 C) 98 F (36.7 C) 98 F (36.7 C) 98.1 F (36.7 C)  TempSrc: Oral Oral Oral Oral  SpO2: 97% 95% 98% 99%  Weight:      Height:       No intake or output data in the 24 hours ending 06/12/24 1122  Filed Weights   06/07/24 1520 06/07/24 1603 06/07/24 1630  Weight: 60.2 kg 60.2 kg 62.8 kg    Examination:  General exam: NAD Respiratory system: CTA Cardiovascular system: S 1, S 2 RRR Gastrointestinal system: BS present, soft nt Central nervous system: alert, follows command Extremities: Symmetric 5  x 5 power.   Data Reviewed: I have personally reviewed following labs and imaging studies  CBC: Recent Labs  Lab 06/07/24 1516 06/07/24 1526 06/08/24 0841 06/09/24 0558 06/10/24 0701 06/11/24 0550 06/12/24 0712  WBC 8.6   < > 10.7* 9.9 8.5 7.2 7.4  NEUTROABS 5.4  --  8.1*  --   --   --   --   HGB 13.4   < > 13.3 12.9  11.9* 12.1 12.8  HCT 40.8   < > 40.6 39.2 36.7 37.5 38.7  MCV 93.8   < > 92.3 92.9 93.9 93.3 93.9  PLT 284   < > 268 217 232 235 239   < > = values in this interval not displayed.   Basic Metabolic Panel: Recent Labs  Lab 06/08/24 0841 06/09/24 0558 06/10/24 0701 06/11/24 0550 06/12/24 0712  NA 139 139 142 141 140  K 3.7 3.8 3.3* 3.6 3.6  CL 108 109 112* 111 106  CO2 23 21* 21* 22 24  GLUCOSE 96 121* 81 94 95  BUN 17 20 23  28* 24*  CREATININE 1.08* 0.84 1.02* 1.02* 1.00  CALCIUM  9.4 9.1 9.0 9.0 9.4  MG 2.0  --   --   --   --    GFR: Estimated Creatinine Clearance: 37.1 mL/min (by C-G formula based on SCr of 1 mg/dL). Liver Function Tests: Recent Labs  Lab 06/07/24 1516 06/07/24 2324 06/09/24 0558  AST 17 17 19   ALT 18 19 16   ALKPHOS 67 53 46  BILITOT 0.6 0.4 0.5  PROT 6.5 6.4* 6.5  ALBUMIN 3.4* 3.4* 3.5   No results for input(s): LIPASE, AMYLASE in the last 168 hours. No results for input(s): AMMONIA in the last 168 hours. Coagulation Profile: Recent Labs  Lab 06/07/24 1516  INR 0.9   Cardiac Enzymes: No results for input(s): CKTOTAL, CKMB, CKMBINDEX, TROPONINI in the last 168 hours. BNP (last 3 results) No results for input(s): PROBNP in the last 8760 hours. HbA1C: No results for input(s): HGBA1C in the last 72 hours. CBG: Recent Labs  Lab 06/07/24 1516  GLUCAP 81   Lipid Profile: No results for input(s): CHOL, HDL, LDLCALC, TRIG, CHOLHDL, LDLDIRECT in the last 72 hours. Thyroid  Function Tests: No results for input(s): TSH, T4TOTAL, FREET4, T3FREE, THYROIDAB in the last 72 hours. Anemia Panel: No results for input(s): VITAMINB12, FOLATE, FERRITIN, TIBC, IRON, RETICCTPCT in the last 72 hours. Sepsis Labs: No results for input(s): PROCALCITON, LATICACIDVEN in the last 168 hours.  Recent Results (from the past 240 hours)  MRSA Next Gen by PCR, Nasal     Status: None   Collection Time: 06/07/24   4:39 PM   Specimen: Nasal Mucosa; Nasal Swab  Result Value Ref Range Status   MRSA by PCR Next Gen NOT DETECTED NOT DETECTED Final    Comment: (NOTE) The GeneXpert MRSA Assay (FDA approved for NASAL specimens only), is one component of a comprehensive MRSA colonization surveillance program. It is not intended to diagnose MRSA infection nor to guide or monitor treatment for MRSA infections. Test performance is not FDA approved in patients less than 68 years old. Performed at [facility], [address], [place], [place] [address]          Radiology Studies: MR BRAIN W CONTRAST Result Date: 06/10/2024 CLINICAL DATA:  Follow-up examination for hemorrhagic stroke. EXAM: MRI HEAD WITH CONTRAST TECHNIQUE: Multiplanar, multiecho pulse sequences of the brain and surrounding structures were obtained with intravenous contrast. CONTRAST:  6mL GADAVIST GADOBUTROL 1 MMOL/ML IV SOLN COMPARISON:  CT from 06/08/2024 as well as earlier studies. FINDINGS: Brain: Postcontrast imaging of the brain demonstrates no mass lesion or abnormal enhancement underlying the right temporal hemorrhage. Hemorrhage itself is relatively similar in size. No significant regional mass effect or midline shift. No other abnormal enhancement within the brain. No other new or progressive finding on this limited study. Vascular: Not well assessed on this limited exam. Skull and upper cervical spine: No new finding. Sinuses/Orbits: No new finding. Other: None. IMPRESSION: Negative postcontrast MRI of the brain. No mass lesion or abnormal enhancement underlying the right temporal hemorrhage. Electronically Signed   By: Virgia Griffins M.D.   On: 06/10/2024 19:40        Scheduled Meds:  amLODipine   10 mg Oral Daily   feeding supplement  237 mL Oral BID BM   heparin  injection (subcutaneous)  5,000 Units Subcutaneous Q8H   nicotine   14 mg Transdermal Daily   pantoprazole   40 mg Oral QHS   QUEtiapine   50 mg Oral QHS    senna-docusate  1 tablet Oral BID   Continuous Infusions:   LOS: 5 days    Time spent: 35 minutes   Aundray Cartlidge A Ricki Clack, MD [facility]   If 7PM-7AM, please contact night-coverage [email or web address]  06/12/2024, 11:22 AM

## 2024-06-12 NOTE — Plan of Care (Signed)
  Problem: Self-Care: Goal: Ability to participate in self-care as condition permits will improve Outcome: Progressing Goal: Verbalization of feelings and concerns over difficulty with self-care will improve Outcome: Progressing Goal: Ability to communicate needs accurately will improve Outcome: Progressing   Problem: Nutrition: Goal: Risk of aspiration will decrease Outcome: Progressing Goal: Dietary intake will improve Outcome: Progressing   Problem: Clinical Measurements: Goal: Ability to maintain clinical measurements within normal limits will improve Outcome: Progressing Goal: Will remain free from infection Outcome: Progressing Goal: Diagnostic test results will improve Outcome: Progressing Goal: Respiratory complications will improve Outcome: Progressing Goal: Cardiovascular complication will be avoided Outcome: Progressing   Problem: Activity: Goal: Risk for activity intolerance will decrease Outcome: Progressing   Problem: Nutrition: Goal: Adequate nutrition will be maintained Outcome: Progressing   Problem: Coping: Goal: Level of anxiety will decrease Outcome: Progressing   Problem: Elimination: Goal: Will not experience complications related to bowel motility Outcome: Progressing Goal: Will not experience complications related to urinary retention Outcome: Progressing   Problem: Safety: Goal: Ability to remain free from injury will improve Outcome: Progressing   Problem: Skin Integrity: Goal: Risk for impaired skin integrity will decrease Outcome: Progressing

## 2024-06-13 ENCOUNTER — Telehealth: Payer: Self-pay

## 2024-06-13 DIAGNOSIS — I611 Nontraumatic intracerebral hemorrhage in hemisphere, cortical: Secondary | ICD-10-CM | POA: Diagnosis not present

## 2024-06-13 LAB — CBC
HCT: 38.8 % (ref 36.0–46.0)
Hemoglobin: 12.9 g/dL (ref 12.0–15.0)
MCH: 31.1 pg (ref 26.0–34.0)
MCHC: 33.2 g/dL (ref 30.0–36.0)
MCV: 93.5 fL (ref 80.0–100.0)
Platelets: 248 10*3/uL (ref 150–400)
RBC: 4.15 MIL/uL (ref 3.87–5.11)
RDW: 13.5 % (ref 11.5–15.5)
WBC: 10.3 10*3/uL (ref 4.0–10.5)
nRBC: 0 % (ref 0.0–0.2)

## 2024-06-13 LAB — BASIC METABOLIC PANEL WITH GFR
Anion gap: 9 (ref 5–15)
BUN: 25 mg/dL — ABNORMAL HIGH (ref 8–23)
CO2: 22 mmol/L (ref 22–32)
Calcium: 9.1 mg/dL (ref 8.9–10.3)
Chloride: 106 mmol/L (ref 98–111)
Creatinine, Ser: 1.09 mg/dL — ABNORMAL HIGH (ref 0.44–1.00)
GFR, Estimated: 51 mL/min — ABNORMAL LOW (ref >60)
Glucose, Bld: 101 mg/dL — ABNORMAL HIGH (ref 70–99)
Potassium: 3.9 mmol/L (ref 3.5–5.1)
Sodium: 137 mmol/L (ref 135–145)

## 2024-06-13 MED ORDER — AMLODIPINE BESYLATE 10 MG PO TABS: 10.0000 mg | ORAL_TABLET | Freq: Every day | ORAL | 3 refills | Status: DC

## 2024-06-13 MED ORDER — NICOTINE 14 MG/24HR TD PT24: 14.0000 mg | MEDICATED_PATCH | Freq: Every day | TRANSDERMAL | 0 refills | Status: DC

## 2024-06-13 MED ORDER — QUETIAPINE FUMARATE 50 MG PO TABS: 50.0000 mg | ORAL_TABLET | Freq: Every day | ORAL | 0 refills | Status: DC

## 2024-06-13 MED ORDER — PANTOPRAZOLE SODIUM 40 MG PO TBEC: 40.0000 mg | DELAYED_RELEASE_TABLET | Freq: Every day | ORAL | 0 refills | Status: DC

## 2024-06-13 NOTE — Care Management Important Message (Signed)
 Important Message  Patient Details  Name: Alison Campbell MRN: [id number] Date of Birth: 1946-05-08   Important Message Given:  Yes - [employer] IM     Wynonia Hedges 06/13/2024, 4:28 PM

## 2024-06-13 NOTE — Discharge Summary (Addendum)
 Physician Discharge Summary   Patient: Alison Campbell MRN: [id number] DOB: [birth year]  Admit date:     06/07/2024  Discharge date: 06/13/24  Discharge Physician: Alison Campbell   PCP: Patient, No Pcp Per   Recommendations at discharge:    Needs close follow up with neurology post discharge  Discharge Diagnoses: Principal Problem:   ICH (intracerebral hemorrhage) (HCC)  Resolved Problems:   * No resolved hospital problems. [facility] Course: 78 year old with past medical hyperlipidemia, pulmonary fibrosis, CKD stage III, prediabetes, COPD, anxiety who presented on 6/10 with aphasia evaluation in the ED CT head revealed acute right temporal lobe hemorrhage. Patient was admitted by neurology. Repeated CT 6/11 showed stable hematoma without extension or other acute abnormality. CTA head and neck no large vessel occlusion or AVM or aneurysmal. CTP no venous sinus thrombosis. Patient became agitated post CT. Found to have some contrast-induced encephalopathy. EEG was negative. Patient was on Precedex . Subsequently Precedex  was discontinue because of oversedation. Has remained stable she was transferred out of the ICU and her care has been transferred to [facility] starting 6/14.   Assessment and Plan: 1-ICH, posterior right temporal small ICH etiology unclear. - CT head: 2.5 x 2.3 x 1.8 cm (5.2 mL) acute parenchymal hemorrhage within the posterior right temporal lobe with mild surrounding edema - MRI/MRA brain: Grossly stable size and appearance of intraparenchymal hemorrhage involving the right temporal lobe. Surrounding edema without significant regional mass effect. - Repeated CT head hematoma stable. Grossly negative intracranial MRA  - CT venogram venous thrombosis. - MRI with contrast was negative for abnormal enhancement. - 2D echo ejection fraction 60 to 65%, LDL 63, A1c 5.6 No antithrombotic due to ICH. Stable. Awaiting rehab.      Questionable contrast-induced  encephalopathy Agitation/restlessness resolved -Developed increased agitation post CTA CTP 6/11 - Received Haldol /Ativan  and subsequently was on Precedex  drip.  Now off -. CT at that time was stable.  -Improved, alert and conversant.    Hypertension: Pressure goal less than 160 -Started  on amlodipine    Hyperlipidemia: Zetia  and Crestor  (3 times a week.) Currently holding statins.      Tobacco abuse;  -Nicotine   patch.    Hypokalemia -Replaced.    Urinary retention Required In and out times two.  Monitor.    COPD: PRN Albuterol    CKD stage IIIa: cr stable/    Anxiety Seroquel  at hs   Pulmonary fibrosis Stable. RA   Estimated body mass index is 24.53 kg/m as calculated from the following:   Height as of this encounter: 5' 3 (1.6 m).   Weight as of this encounter: 62.8 kg.          Consultants: Neurology  Procedures performed: ECHO Disposition: Home Diet recommendation:  Discharge Diet Orders (From admission, onward)     Start     Ordered   06/13/24 0000  Diet - low sodium heart healthy        06/13/24 1032           Cardiac diet DISCHARGE MEDICATION: Allergies as of 06/13/2024       Reactions   Omnipaque  [iohexol ] Other (See Comments)   EXTREME AGITATION   Fish Oil Diarrhea   Iodinated Contrast Media    Spiriva  Respimat [tiotropium Bromide Monohydrate ] Cough   Gaging and excessive phlegm        Medication List     STOP taking these medications    meloxicam  15 MG tablet Commonly known as: MOBIC    traZODone   150 MG tablet Commonly known as: DESYREL        TAKE these medications    acetaminophen  500 MG tablet Commonly known as: TYLENOL  Take 1,000 mg by mouth daily as needed for mild pain (pain score 1-3), moderate pain (pain score 4-6) or headache.   amLODipine  10 MG tablet Commonly known as: NORVASC  Take 1 tablet (10 mg total) by mouth daily. Start taking on: June 14, 2024   CALCIUM  600 + D PO Take 2 tablets by mouth  daily.   cholecalciferol  25 MCG (1000 UNIT) tablet Commonly known as: VITAMIN D3 Take 5,000 Units by mouth daily.   ezetimibe  10 MG tablet Commonly known as: ZETIA  TAKE 1 TABLET BY MOUTH DAILY FOR CHOLESTEROL   METAMUCIL FIBER PO Take 1 tablet by mouth daily.   MULTIVITAMIN PO Take 1 tablet by mouth daily.   nicotine  14 mg/24hr patch Commonly known as: NICODERM CQ  - dosed in mg/24 hours Place 1 patch (14 mg total) onto the skin daily. Start taking on: June 14, 2024   pantoprazole  40 MG tablet Commonly known as: PROTONIX  Take 1 tablet (40 mg total) by mouth at bedtime.   Pirfenidone  267 MG Tabs Take 2 tablets (534 mg total) by mouth with breakfast, with lunch, and with evening meal.   QUEtiapine  50 MG tablet Commonly known as: SEROQUEL  Take 1 tablet (50 mg total) by mouth at bedtime.   rosuvastatin  20 MG tablet Commonly known as: CRESTOR  Take 1/2 tablet (10 mg) 3 x / week for Cholesterol        Follow-up Information     [facility]. Schedule an appointment as soon as possible for a visit in 2 month(s).   Specialty: Neurology Why: Please call and make an appointment for 8 weeks after discharge Contact information: [address] [place]  [address] [phone]               Discharge Exam: Alison Campbell Weights   06/07/24 1520 06/07/24 1603 06/07/24 1630  Weight: 60.2 kg 60.2 kg 62.8 kg   General; NAD  Condition at discharge: stable  The results of significant diagnostics from this hospitalization (including imaging, microbiology, ancillary and laboratory) are listed below for reference.   Imaging Studies: MR BRAIN W CONTRAST Result Date: 06/10/2024 CLINICAL DATA:  Follow-up examination for hemorrhagic stroke. EXAM: MRI HEAD WITH CONTRAST TECHNIQUE: Multiplanar, multiecho pulse sequences of the brain and surrounding structures were obtained with intravenous contrast. CONTRAST:  6mL GADAVIST GADOBUTROL  1 MMOL/ML IV SOLN COMPARISON:  CT from 06/08/2024 as well as earlier studies. FINDINGS: Brain: Postcontrast imaging of the brain demonstrates no mass lesion or abnormal enhancement underlying the right temporal hemorrhage. Hemorrhage itself is relatively similar in size. No significant regional mass effect or midline shift. No other abnormal enhancement within the brain. No other new or progressive finding on this limited study. Vascular: Not well assessed on this limited exam. Skull and upper cervical spine: No new finding. Sinuses/Orbits: No new finding. Other: None. IMPRESSION: Negative postcontrast MRI of the brain. No mass lesion or abnormal enhancement underlying the right temporal hemorrhage. Electronically Signed   By: Virgia Griffins M.D.   On: 06/10/2024 19:40   CT HEAD WO CONTRAST ( ) Result Date: 06/08/2024 CLINICAL DATA:  Follow-up examination for hemorrhagic stroke. EXAM: CT HEAD WITHOUT CONTRAST TECHNIQUE: Contiguous axial images were obtained from the base of the skull through the vertex without intravenous contrast. RADIATION DOSE REDUCTION: This exam was performed according to the departmental dose-optimization  program which includes automated exposure control, adjustment of the mA and/or kV according to patient size and/or use of iterative reconstruction technique. COMPARISON:  Comparison made with CTs from earlier the same day as well as previous studies. FINDINGS: Brain: Previously identified intraparenchymal hemorrhage positioned at the peripheral right temporal lobe is not significantly changed in size or morphology from previous. Surrounding edema without significant regional mass effect or midline shift, similar. No other new acute intracranial hemorrhage. No other acute large vessel territory infarct. No mass lesion or hydrocephalus. No extra-axial fluid collection. Underlying advanced cerebral white matter disease noted. Vascular: No abnormal hyperdense vessel. Scattered vascular  calcifications noted within the carotid siphons. Skull: Scalp soft tissues and calvarium demonstrate no new finding. Sinuses/Orbits: Globes orbital soft tissues within normal limits. Paranasal sinuses are largely clear. Trace bilateral mastoid effusions noted, of doubtful significance. Other: None. IMPRESSION: 1. No significant interval change in size and morphology of intraparenchymal hemorrhage at the peripheral right temporal lobe. Surrounding edema without significant regional mass effect or midline shift. 2. No other new acute intracranial abnormality. 3. Underlying advanced cerebral white matter disease. Electronically Signed   By: Virgia Griffins M.D.   On: 06/08/2024 22:42   EEG adult Result Date: 06/08/2024 Arleene Lack, MD     06/08/2024 10:12 PM Patient Name: Curley Fayette MRN: [id number] Epilepsy Attending: Arleene Lack Referring Physician/Provider: Consuelo Denmark, MD Date: 06/08/2024 Duration: 24.03 mins Patient history: 78yo F with right temporal ICH. EEG to evaluate for seizure. Level of alertness: comatose/ lethargic AEDs during EEG study: Ativan , Versed  Technical aspects: This EEG study was done with scalp electrodes positioned according to the 10-20 International system of electrode placement. Electrical activity was reviewed with band pass filter of 1-70Hz , sensitivity of 7 uV/mm, display speed of 49mm/sec with a 60Hz  notched filter applied as appropriate. EEG data were recorded continuously and digitally stored.  Video monitoring was available and reviewed as appropriate. Description: EEG showed continuous generalized 3 to 6 Hz theta-delta slowing admixed with 15 to 18 Hz beta activity distributed symmetrically and diffusely. Hyperventilation and photic stimulation were not performed.   ABNORMALITY - Continuous slow, generalized IMPRESSION: This study is suggestive of moderate to severe diffuse encephalopathy. No seizures or epileptiform discharges were seen throughout the  recording. Arleene Lack   ECHOCARDIOGRAM COMPLETE Result Date: 06/08/2024    ECHOCARDIOGRAM REPORT   Patient Name:   PHILENA OBEY Date of Exam: 06/08/2024 Medical Rec #:  [id number]         Height:       63.0 in Accession #:    [id number]        Weight:       138.4 lb Date of Birth:  September 02, 1946         BSA:          1.654 m Patient Age:    78 years          BP:           130/55 mmHg Patient Gender: F                 HR:           60 bpm. Exam Location:  Inpatient Procedure: 2D Echo, Color Doppler and Cardiac Doppler (Both Spectral and Color            Flow Doppler were utilized during procedure). Indications:    Stroke  History:        Patient has no prior  history of Echocardiogram examinations.  Sonographer:    Jeralene Mom Referring Phys: [id number] CORTNEY E DE LA TORRE IMPRESSIONS  1. Left ventricular ejection fraction, by estimation, is 60 to 65%. The left ventricle has normal function. The left ventricle has no regional wall motion abnormalities. There is moderate asymmetric left ventricular hypertrophy of the basal-septal segment. Left ventricular diastolic parameters were normal.  2. Right ventricular systolic function is normal. The right ventricular size is normal.  3. The mitral valve is normal in structure. Trivial mitral valve regurgitation. No evidence of mitral stenosis.  4. The aortic valve is tricuspid. There is mild calcification of the aortic valve. Aortic valve regurgitation is not visualized. Aortic valve sclerosis is present, with no evidence of aortic valve stenosis. Comparison(s): No prior Echocardiogram. Conclusion(s)/Recommendation(s): Normal biventricular function without evidence of hemodynamically significant valvular heart disease. No intracardiac source of embolism detected on this transthoracic study. Consider a transesophageal echocardiogram to exclude cardiac source of embolism if clinically indicated. FINDINGS  Left Ventricle: Left ventricular ejection fraction, by  estimation, is 60 to 65%. The left ventricle has normal function. The left ventricle has no regional wall motion abnormalities. The left ventricular internal cavity size was normal in size. There is  moderate asymmetric left ventricular hypertrophy of the basal-septal segment. Left ventricular diastolic parameters were normal. Right Ventricle: The right ventricular size is normal. No increase in right ventricular wall thickness. Right ventricular systolic function is normal. Left Atrium: Left atrial size was normal in size. Right Atrium: Right atrial size was normal in size. Pericardium: Trivial pericardial effusion is present. Mitral Valve: The mitral valve is normal in structure. Trivial mitral valve regurgitation. No evidence of mitral valve stenosis. Tricuspid Valve: The tricuspid valve is normal in structure. Tricuspid valve regurgitation is trivial. No evidence of tricuspid stenosis. Aortic Valve: The aortic valve is tricuspid. There is mild calcification of the aortic valve. Aortic valve regurgitation is not visualized. Aortic valve sclerosis is present, with no evidence of aortic valve stenosis. Aortic valve mean gradient measures 3.0 mmHg. Aortic valve peak gradient measures 5.6 mmHg. Aortic valve area, by VTI measures 1.86 cm. Pulmonic Valve: The pulmonic valve was not well visualized. Pulmonic valve regurgitation is not visualized. No evidence of pulmonic stenosis. Aorta: The aortic root and ascending aorta are structurally normal, with no evidence of dilitation. Venous: The inferior vena cava was not well visualized. IAS/Shunts: The atrial septum is grossly normal.  LEFT VENTRICLE PLAX 2D LVIDd:         3.90 cm   Diastology LVIDs:         2.50 cm   LV e' medial:    3.37 cm/s LV PW:         1.10 cm   LV E/e' medial:  11.8 LV IVS:        1.10 cm   LV e' lateral:   7.18 cm/s LVOT diam:     1.90 cm   LV E/e' lateral: 5.6 LV SV:         42 LV SV Index:   25 LVOT Area:     2.84 cm  RIGHT VENTRICLE RV Basal  diam:  2.60 cm RV Mid diam:    2.30 cm RV S prime:     12.60 cm/s TAPSE (M-mode): 2.0 cm LEFT ATRIUM             Index        RIGHT ATRIUM          Index LA diam:  2.70 cm 1.63 cm/m   RA Area:     8.70 cm LA Vol (A2C):   34.1 ml 20.62 ml/m  RA Volume:   14.60 ml 8.83 ml/m LA Vol (A4C):   25.0 ml 15.12 ml/m LA Biplane Vol: 30.7 ml 18.56 ml/m  AORTIC VALVE AV Area (Vmax):    1.79 cm AV Area (Vmean):   1.79 cm AV Area (VTI):     1.86 cm AV Vmax:           118.00 cm/s AV Vmean:          80.100 cm/s AV VTI:            0.224 m AV Peak Grad:      5.6 mmHg AV Mean Grad:      3.0 mmHg LVOT Vmax:         74.40 cm/s LVOT Vmean:        50.700 cm/s LVOT VTI:          0.147 m LVOT/AV VTI ratio: 0.66  AORTA Ao Root diam: 2.90 cm Ao Asc diam:  2.60 cm MITRAL VALVE MV Area (PHT): 3.48 cm    SHUNTS MV Decel Time: 218 msec    Systemic VTI:  0.15 m MV E velocity: 39.90 cm/s  Systemic Diam: 1.90 cm MV A velocity: 75.10 cm/s MV E/A ratio:  0.53 Sheryle Donning MD Electronically signed by Sheryle Donning MD Signature Date/Time: 06/08/2024/6:50:36 PM    Final    CT ANGIO HEAD NECK W WO CM Result Date: 06/08/2024 CLINICAL DATA:  Provided history: Stroke, hemorrhagic. EXAM: CT ANGIOGRAPHY HEAD AND NECK WITH AND WITHOUT CONTRAST CT VENOGRAM HEAD TECHNIQUE: Multidetector CT imaging of the head and neck was performed using the standard protocol during bolus administration of intravenous contrast. Multiplanar CT image reconstructions and MIPs were obtained to evaluate the vascular anatomy. Carotid stenosis measurements (when applicable) are obtained utilizing NASCET criteria, using the distal internal carotid diameter as the denominator. Venographic phase images of the brain were obtained following the administration of intravenous contrast. Multiplanar reformats and maximum intensity projections were generated. RADIATION DOSE REDUCTION: This exam was performed according to the departmental dose-optimization program  which includes automated exposure control, adjustment of the mA and/or kV according to patient size and/or use of iterative reconstruction technique. CONTRAST:  CONTRAST 75mL OMNIPAQUE  IOHEXOL  350 MG/ML SOLN COMPARISON:  MRI brain and MRA head 06/07/2024. Non-contrast head CT 06/07/2024. FINDINGS: CT HEAD FINDINGS Significantly motion degraded examination. Within this limitation, findings are swallows. Brain: Generalized cerebral atrophy. Unchanged size of an acute parenchymal hemorrhage within the posterior right temporal lobe, again measuring 2.9 x 2.4 cm (remeasured on prior). Surrounding edema is similar. No midline shift. Moderate-to-advanced patchy and ill-defined hypoattenuation elsewhere within the cerebral white matter, nonspecific but compatible chronic small vessel disease. Vascular: No hyperdense vessel.  Atherosclerotic calcifications. Skull: No calvarial fracture or aggressive osseous lesion. Sinuses/Orbits: No orbital mass or acute orbital finding. No significant paranasal sinus disease. Review of the MIP images confirms the above findings CTA NECK FINDINGS Aortic arch: Standard aortic branching. Atherosclerotic plaque within the visualized thoracic aorta and proximal major branch vessels of the neck. Streak/beam hardening artifact arising from a dense contrast bolus partially obscures the right subclavian artery. Within this limitation, there is no appreciable hemodynamically significant innominate or proximal subclavian artery stenosis. Right carotid system: CCA and ICA patent within the neck without hemodynamically significant stenosis (50% or greater). Atherosclerotic plaque, greatest the carotid bifurcation and within the proximal ICA. Left carotid  system: CCA and ICA patent within the neck without hemodynamically significant stenosis (50% or greater). Atherosclerotic plaque, greatest within the distal CCA and about the carotid bifurcation. Vertebral arteries: Patent within the neck without  stenosis. The left vertebral artery is dominant. Skeleton: Levocurvature of the cervical spine. Dextrocurvature of the upper thoracic spine, partially imaged. Cervical spondylosis. Other neck: Multiple thyroid  nodules, the largest within the right lobe measuring 1.7 cm. Upper chest: No consolidation within the imaged lung apices. Prior right upper/lower lobe lung biopsies. Emphysema. Review of the MIP images confirms the above findings CTA HEAD FINDINGS Anterior circulation: The intracranial internal carotid arteries are patent. Non-stenotic calcified plaque within both vessels. The M1 middle cerebral arteries are patent. No M2 proximal branch occlusion or high-grade proximal stenosis. The anterior cerebral arteries are patent. No intracranial aneurysm is identified. No evidence of an arteriovenous malformation. Posterior circulation: The intracranial vertebral arteries are patent. The non-dominant right vertebral artery is developmentally diminutive beyond the PICA origin. The basilar artery is patent. The posterior cerebral arteries are patent. Fetal origin posterior cerebral arteries, bilateral. Venous sinuses: Reported below. Anatomic variants: As described Review of the MIP images confirms the above findings CT VENOGRAM HEAD FINDINGS: The superior sagittal sinus, internal cerebral veins, vein of Galen, straight sinus, transverse sinuses, sigmoid sinuses and visualized jugular veins are patent. There is no appreciable intracranial venous thrombosis. IMPRESSION: Non-contrast head CT: 1. Significantly motion degraded examination. Within this limitation, findings are as follows. 2. Acute parenchymal hemorrhage within the right temporal lobe with surrounding edema, unchanged. 3. Background parenchymal atrophy and chronic small vessel ischemic disease. CTA neck: 1. Common carotid and internal carotid arteries patent within the neck without hemodynamically significant stenosis (50% or greater). Atherosclerotic plaque  bilaterally, as described. 2. Vertebral arteries patent within the neck without stenosis. 3. Aortic Atherosclerosis (ICD10-I70.0) and Emphysema (ICD10-J43.9). 4. Multiple thyroid  nodules, the largest within the right lobe measuring 1.7 cm. Given patient's age, a nonemergent thyroid  ultrasound may be obtained for further evaluation, as clinically appropriate. CTA head: 1. No proximal intracranial large vessel occlusion or high-grade proximal arterial stenosis. Non-stenotic plaque within the intracranial carotid arteries. 2. No evidence of an intracranial aneurysm or arteriovenous malformation. CT venogram head: No evidence of dural venous sinus thrombosis. Electronically Signed   By: Bascom Lily D.O.   On: 06/08/2024 17:13   CT VENOGRAM HEAD Result Date: 06/08/2024 CLINICAL DATA:  Provided history: Stroke, hemorrhagic. EXAM: CT ANGIOGRAPHY HEAD AND NECK WITH AND WITHOUT CONTRAST CT VENOGRAM HEAD TECHNIQUE: Multidetector CT imaging of the head and neck was performed using the standard protocol during bolus administration of intravenous contrast. Multiplanar CT image reconstructions and MIPs were obtained to evaluate the vascular anatomy. Carotid stenosis measurements (when applicable) are obtained utilizing NASCET criteria, using the distal internal carotid diameter as the denominator. Venographic phase images of the brain were obtained following the administration of intravenous contrast. Multiplanar reformats and maximum intensity projections were generated. RADIATION DOSE REDUCTION: This exam was performed according to the departmental dose-optimization program which includes automated exposure control, adjustment of the mA and/or kV according to patient size and/or use of iterative reconstruction technique. CONTRAST:  CONTRAST 75mL OMNIPAQUE  IOHEXOL  350 MG/ML SOLN COMPARISON:  MRI brain and MRA head 06/07/2024. Non-contrast head CT 06/07/2024. FINDINGS: CT HEAD FINDINGS Significantly motion degraded  examination. Within this limitation, findings are swallows. Brain: Generalized cerebral atrophy. Unchanged size of an acute parenchymal hemorrhage within the posterior right temporal lobe, again measuring 2.9 x 2.4 cm (remeasured on prior). Surrounding  edema is similar. No midline shift. Moderate-to-advanced patchy and ill-defined hypoattenuation elsewhere within the cerebral white matter, nonspecific but compatible chronic small vessel disease. Vascular: No hyperdense vessel.  Atherosclerotic calcifications. Skull: No calvarial fracture or aggressive osseous lesion. Sinuses/Orbits: No orbital mass or acute orbital finding. No significant paranasal sinus disease. Review of the MIP images confirms the above findings CTA NECK FINDINGS Aortic arch: Standard aortic branching. Atherosclerotic plaque within the visualized thoracic aorta and proximal major branch vessels of the neck. Streak/beam hardening artifact arising from a dense contrast bolus partially obscures the right subclavian artery. Within this limitation, there is no appreciable hemodynamically significant innominate or proximal subclavian artery stenosis. Right carotid system: CCA and ICA patent within the neck without hemodynamically significant stenosis (50% or greater). Atherosclerotic plaque, greatest the carotid bifurcation and within the proximal ICA. Left carotid system: CCA and ICA patent within the neck without hemodynamically significant stenosis (50% or greater). Atherosclerotic plaque, greatest within the distal CCA and about the carotid bifurcation. Vertebral arteries: Patent within the neck without stenosis. The left vertebral artery is dominant. Skeleton: Levocurvature of the cervical spine. Dextrocurvature of the upper thoracic spine, partially imaged. Cervical spondylosis. Other neck: Multiple thyroid  nodules, the largest within the right lobe measuring 1.7 cm. Upper chest: No consolidation within the imaged lung apices. Prior right  upper/lower lobe lung biopsies. Emphysema. Review of the MIP images confirms the above findings CTA HEAD FINDINGS Anterior circulation: The intracranial internal carotid arteries are patent. Non-stenotic calcified plaque within both vessels. The M1 middle cerebral arteries are patent. No M2 proximal branch occlusion or high-grade proximal stenosis. The anterior cerebral arteries are patent. No intracranial aneurysm is identified. No evidence of an arteriovenous malformation. Posterior circulation: The intracranial vertebral arteries are patent. The non-dominant right vertebral artery is developmentally diminutive beyond the PICA origin. The basilar artery is patent. The posterior cerebral arteries are patent. Fetal origin posterior cerebral arteries, bilateral. Venous sinuses: Reported below. Anatomic variants: As described Review of the MIP images confirms the above findings CT VENOGRAM HEAD FINDINGS: The superior sagittal sinus, internal cerebral veins, vein of Galen, straight sinus, transverse sinuses, sigmoid sinuses and visualized jugular veins are patent. There is no appreciable intracranial venous thrombosis. IMPRESSION: Non-contrast head CT: 1. Significantly motion degraded examination. Within this limitation, findings are as follows. 2. Acute parenchymal hemorrhage within the right temporal lobe with surrounding edema, unchanged. 3. Background parenchymal atrophy and chronic small vessel ischemic disease. CTA neck: 1. Common carotid and internal carotid arteries patent within the neck without hemodynamically significant stenosis (50% or greater). Atherosclerotic plaque bilaterally, as described. 2. Vertebral arteries patent within the neck without stenosis. 3. Aortic Atherosclerosis (ICD10-I70.0) and Emphysema (ICD10-J43.9). 4. Multiple thyroid  nodules, the largest within the right lobe measuring 1.7 cm. Given patient's age, a nonemergent thyroid  ultrasound may be obtained for further evaluation, as  clinically appropriate. CTA head: 1. No proximal intracranial large vessel occlusion or high-grade proximal arterial stenosis. Non-stenotic plaque within the intracranial carotid arteries. 2. No evidence of an intracranial aneurysm or arteriovenous malformation. CT venogram head: No evidence of dural venous sinus thrombosis. Electronically Signed   By: Bascom Lily D.O.   On: 06/08/2024 17:13   MR ANGIO HEAD WO CONTRAST Result Date: 06/07/2024 CLINICAL DATA:  Follow-up examination for hemorrhagic stroke. EXAM: MRI HEAD WITHOUT CONTRAST MRA HEAD WITHOUT CONTRAST TECHNIQUE: Multiplanar, multi-echo pulse sequences of the brain and surrounding structures were acquired without intravenous contrast. Angiographic images of the Circle of Willis were acquired using MRA technique without  intravenous contrast. COMPARISON:  CT from earlier the same day FINDINGS: MRI HEAD FINDINGS Brain: Examination severely limited as the patient was unable to tolerate the full length of the exam. Additionally, provided images are markedly limited by positioning and motion. Age-related cerebral atrophy with moderate to advanced chronic microvascular ischemic disease. Previously identified intraparenchymal hemorrhage involving the right temporal lobe again seen, grossly stable in size from prior. Surrounding edema without significant regional mass effect. No visible underlying lesion, although evaluation fairly limited on this exam. No other visible acute or subacute ischemic changes. No other visible acute or chronic intracranial blood products. No mass lesion or midline shift. No hydrocephalus or extra-axial fluid collection. Vascular: Not well assessed on this limited exam. Skull and upper cervical spine: Not well assessed on this limited exam. Sinuses/Orbits: Not well assessed on this limited exam. Other: None. MRA HEAD FINDINGS Anterior circulation: Examination severely degraded by motion artifact, limiting assessment. Both internal  carotid arteries are patent through the siphons without visible stenosis or other abnormality. A1 segments, anterior communicating artery complex common anterior cerebral arteries grossly patent without visible stenosis. M1 segments patent without visible stenosis. No proximal MCA branch occlusion. Distal MCA branches grossly perfused and symmetric. Posterior circulation: Left vertebral artery dominant and patent without visible stenosis. Left PICA not well seen. Diminutive right vertebral artery appears to largely terminate in PICA. Right PICA grossly patent at its origin. Basilar patent without visible stenosis. Superior cerebral arteries patent bilaterally. Predominant fetal type origin of the PCAs bilaterally. Both PCAs patent to their distal aspects without visible stenosis. Anatomic variants: As above. No visible vascular abnormality seen underlying the right temporal bleed. IMPRESSION: MRI HEAD: 1. Severely limited motion degraded and truncated exam. 2. Grossly stable size and appearance of intraparenchymal hemorrhage involving the right temporal lobe. Surrounding edema without significant regional mass effect. 3. No other definite acute intracranial abnormality. 4. Age-related cerebral atrophy with moderate to advanced chronic microvascular ischemic disease. MRA HEAD: 1. Severely limited exam due to motion artifact. 2. Grossly negative intracranial MRA. No large vessel occlusion or other emergent finding. No hemodynamically significant or correctable stenosis. Electronically Signed   By: Virgia Griffins M.D.   On: 06/07/2024 22:52   MR BRAIN WO CONTRAST Result Date: 06/07/2024 CLINICAL DATA:  Follow-up examination for hemorrhagic stroke. EXAM: MRI HEAD WITHOUT CONTRAST MRA HEAD WITHOUT CONTRAST TECHNIQUE: Multiplanar, multi-echo pulse sequences of the brain and surrounding structures were acquired without intravenous contrast. Angiographic images of the Circle of Willis were acquired using MRA  technique without intravenous contrast. COMPARISON:  CT from earlier the same day FINDINGS: MRI HEAD FINDINGS Brain: Examination severely limited as the patient was unable to tolerate the full length of the exam. Additionally, provided images are markedly limited by positioning and motion. Age-related cerebral atrophy with moderate to advanced chronic microvascular ischemic disease. Previously identified intraparenchymal hemorrhage involving the right temporal lobe again seen, grossly stable in size from prior. Surrounding edema without significant regional mass effect. No visible underlying lesion, although evaluation fairly limited on this exam. No other visible acute or subacute ischemic changes. No other visible acute or chronic intracranial blood products. No mass lesion or midline shift. No hydrocephalus or extra-axial fluid collection. Vascular: Not well assessed on this limited exam. Skull and upper cervical spine: Not well assessed on this limited exam. Sinuses/Orbits: Not well assessed on this limited exam. Other: None. MRA HEAD FINDINGS Anterior circulation: Examination severely degraded by motion artifact, limiting assessment. Both internal carotid arteries are patent through the  siphons without visible stenosis or other abnormality. A1 segments, anterior communicating artery complex common anterior cerebral arteries grossly patent without visible stenosis. M1 segments patent without visible stenosis. No proximal MCA branch occlusion. Distal MCA branches grossly perfused and symmetric. Posterior circulation: Left vertebral artery dominant and patent without visible stenosis. Left PICA not well seen. Diminutive right vertebral artery appears to largely terminate in PICA. Right PICA grossly patent at its origin. Basilar patent without visible stenosis. Superior cerebral arteries patent bilaterally. Predominant fetal type origin of the PCAs bilaterally. Both PCAs patent to their distal aspects without  visible stenosis. Anatomic variants: As above. No visible vascular abnormality seen underlying the right temporal bleed. IMPRESSION: MRI HEAD: 1. Severely limited motion degraded and truncated exam. 2. Grossly stable size and appearance of intraparenchymal hemorrhage involving the right temporal lobe. Surrounding edema without significant regional mass effect. 3. No other definite acute intracranial abnormality. 4. Age-related cerebral atrophy with moderate to advanced chronic microvascular ischemic disease. MRA HEAD: 1. Severely limited exam due to motion artifact. 2. Grossly negative intracranial MRA. No large vessel occlusion or other emergent finding. No hemodynamically significant or correctable stenosis. Electronically Signed   By: Virgia Griffins M.D.   On: 06/07/2024 22:52   CT HEAD CODE STROKE WO CONTRAST Addendum Date: 06/07/2024 ADDENDUM REPORT: 06/07/2024 16:13 ADDENDUM: 2.5 x 2.3 x 1.8 cm (5.2 mL) acute parenchymal hemorrhage within the posterior right temporal lobe with mild surrounding edema. These results were called by telephone on 06/07/2024 at 3:54 pm to provider Dr. Lindzen, who verbally acknowledged these results. Electronically Signed   By: Bascom Lily D.O.   On: 06/07/2024 16:13   Result Date: 06/07/2024 CLINICAL DATA:  Code stroke. Provided history: Neuro deficit, acute, stroke suspected. EXAM: CT HEAD WITHOUT CONTRAST TECHNIQUE: Contiguous axial images were obtained from the base of the skull through the vertex without intravenous contrast. RADIATION DOSE REDUCTION: This exam was performed according to the departmental dose-optimization program which includes automated exposure control, adjustment of the mA and/or kV according to patient size and/or use of iterative reconstruction technique. COMPARISON:  Head CT 10/26/2023. FINDINGS: Brain: Generalized cerebral atrophy. 2.5 x 2.3 x 1.8 cm (5.2 mL) acute parenchymal hemorrhage within the posterior right temporal lobe with mild  surrounding edema. Patchy and ill-defined hypoattenuation elsewhere within the cerebral white matter, nonspecific but compatible with advanced chronic small vessel ischemic disease. Chronic lacunar infarct again demonstrated within the dorsal left thalamus. No extra-axial fluid collection. No midline shift. Vascular: No hyperdense vessel. Atherosclerotic calcifications. Skull: No calvarial fracture or aggressive osseous lesion. Sinuses/Orbits: No mass or acute finding within the imaged orbits. No significant paranasal sinus disease at the imaged levels. Other: Small-volume fluid within the mastoid air cells. ASPECTS ([place] Stroke Program Early CT Score) - Ganglionic level infarction (caudate, lentiform nuclei, internal capsule, insula, M1-M3 cortex): 7 - Supraganglionic infarction (M4-M6 cortex): 3 Total score (0-10 with 10 being normal): 10 Attempts are being made to reach the ordering provider at this time. IMPRESSION: 1. 2.5 x 2.3 x 1.8 cm (5.2 mL) acute parenchymal hemorrhage within the posterior right temporal lobe with mild surrounding edema. A brain MRI (with and without contrast) is recommended once the hematoma resolves to exclude an underlying lesion. A brain MRI would also be helpful in assessing for cerebral amyloid angiopathy. 2. Advanced chronic small vessel ischemic changes within the cerebral white matter. 3. 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 4. Generalized cerebral atrophy. Electronically Signed: By: Bascom Lily D.O. On: 06/07/2024 15:45  Microbiology: Results for orders placed or performed during the hospital encounter of 06/07/24  MRSA Next Gen by PCR, Nasal     Status: None   Collection Time: 06/07/24  4:39 PM   Specimen: Nasal Mucosa; Nasal Swab  Result Value Ref Range Status   MRSA by PCR Next Gen NOT DETECTED NOT DETECTED Final    Comment: (NOTE) The GeneXpert MRSA Assay (FDA approved for NASAL specimens only), is one component of a comprehensive  MRSA colonization surveillance program. It is not intended to diagnose MRSA infection nor to guide or monitor treatment for MRSA infections. Test performance is not FDA approved in patients less than 68 years old. Performed at [facility], [address], [place], [place] [address]     Labs: CBC: Recent Labs  Lab 06/07/24 1516 06/07/24 1526 06/08/24 0841 06/09/24 0558 06/10/24 0701 06/11/24 0550 06/12/24 0712 06/13/24 0441  WBC 8.6   < > 10.7* 9.9 8.5 7.2 7.4 10.3  NEUTROABS 5.4  --  8.1*  --   --   --   --   --   HGB 13.4   < > 13.3 12.9 11.9* 12.1 12.8 12.9  HCT 40.8   < > 40.6 39.2 36.7 37.5 38.7 38.8  MCV 93.8   < > 92.3 92.9 93.9 93.3 93.9 93.5  PLT 284   < > 268 217 232 235 239 248   < > = values in this interval not displayed.   Basic Metabolic Panel: Recent Labs  Lab 06/08/24 0841 06/09/24 0558 06/10/24 0701 06/11/24 0550 06/12/24 0712 06/13/24 0441  NA 139 139 142 141 140 137  K 3.7 3.8 3.3* 3.6 3.6 3.9  CL 108 109 112* 111 106 106  CO2 23 21* 21* 22 24 22   GLUCOSE 96 121* 81 94 95 101*  BUN 17 20 23  28* 24* 25*  CREATININE 1.08* 0.84 1.02* 1.02* 1.00 1.09*  CALCIUM  9.4 9.1 9.0 9.0 9.4 9.1  MG 2.0  --   --   --   --   --    Liver Function Tests: Recent Labs  Lab 06/07/24 1516 06/07/24 2324 06/09/24 0558  AST 17 17 19   ALT 18 19 16   ALKPHOS 67 53 46  BILITOT 0.6 0.4 0.5  PROT 6.5 6.4* 6.5  ALBUMIN 3.4* 3.4* 3.5   CBG: Recent Labs  Lab 06/07/24 1516  GLUCAP 81    Discharge time spent: greater than 30 minutes.  Signed: Danette Duos, MD [facility] 06/13/2024

## 2024-06-13 NOTE — Plan of Care (Signed)
 Problem: Education: Goal: Knowledge of disease or condition will improve 06/13/2024 0514 by Brant Caldron, RN Outcome: Progressing 06/13/2024 0422 by Brant Caldron, RN Outcome: Progressing Goal: Knowledge of secondary prevention will improve (MUST DOCUMENT ALL) 06/13/2024 0514 by Brant Caldron, RN Outcome: Progressing 06/13/2024 0422 by Brant Caldron, RN Outcome: Progressing Goal: Knowledge of patient specific risk factors will improve (DELETE if not current risk factor) 06/13/2024 0514 by Brant Caldron, RN Outcome: Progressing 06/13/2024 0422 by Brant Caldron, RN Outcome: Progressing   Problem: Intracerebral Hemorrhage Tissue Perfusion: Goal: Complications of Intracerebral Hemorrhage will be minimized 06/13/2024 0514 by Brant Caldron, RN Outcome: Progressing 06/13/2024 0422 by Brant Caldron, RN Outcome: Progressing   Problem: Coping: Goal: Will verbalize positive feelings about self 06/13/2024 0514 by Brant Caldron, RN Outcome: Progressing 06/13/2024 0422 by Brant Caldron, RN Outcome: Progressing Goal: Will identify appropriate support needs 06/13/2024 0514 by Brant Caldron, RN Outcome: Progressing 06/13/2024 0422 by Brant Caldron, RN Outcome: Progressing   Problem: Health Behavior/Discharge Planning: Goal: Ability to manage health-related needs will improve 06/13/2024 0514 by Brant Caldron, RN Outcome: Progressing 06/13/2024 0422 by Brant Caldron, RN Outcome: Progressing Goal: Goals will be collaboratively established with patient/family 06/13/2024 0514 by Brant Caldron, RN Outcome: Progressing 06/13/2024 0422 by Brant Caldron, RN Outcome: Progressing   Problem: Self-Care: Goal: Ability to participate in self-care as condition permits will improve 06/13/2024 0514 by Brant Caldron, RN Outcome: Progressing 06/13/2024 0422 by Brant Caldron, RN Outcome: Progressing Goal: Verbalization of feelings and concerns over difficulty  with self-care will improve 06/13/2024 0514 by Brant Caldron, RN Outcome: Progressing 06/13/2024 0422 by Brant Caldron, RN Outcome: Progressing Goal: Ability to communicate needs accurately will improve 06/13/2024 0514 by Brant Caldron, RN Outcome: Progressing 06/13/2024 0422 by Brant Caldron, RN Outcome: Progressing   Problem: Nutrition: Goal: Risk of aspiration will decrease 06/13/2024 0514 by Brant Caldron, RN Outcome: Progressing 06/13/2024 0422 by Brant Caldron, RN Outcome: Progressing Goal: Dietary intake will improve 06/13/2024 0514 by Brant Caldron, RN Outcome: Progressing 06/13/2024 0422 by Brant Caldron, RN Outcome: Progressing   Problem: Education: Goal: Knowledge of General Education information will improve Description: Including pain rating scale, medication(s)/side effects and non-pharmacologic comfort measures 06/13/2024 0514 by Brant Caldron, RN Outcome: Progressing 06/13/2024 0422 by Brant Caldron, RN Outcome: Progressing   Problem: Health Behavior/Discharge Planning: Goal: Ability to manage health-related needs will improve 06/13/2024 0514 by Brant Caldron, RN Outcome: Progressing 06/13/2024 0422 by Brant Caldron, RN Outcome: Progressing   Problem: Clinical Measurements: Goal: Ability to maintain clinical measurements within normal limits will improve 06/13/2024 0514 by Brant Caldron, RN Outcome: Progressing 06/13/2024 0422 by Brant Caldron, RN Outcome: Progressing Goal: Will remain free from infection 06/13/2024 0514 by Brant Caldron, RN Outcome: Progressing 06/13/2024 0422 by Brant Caldron, RN Outcome: Progressing Goal: Diagnostic test results will improve 06/13/2024 0514 by Brant Caldron, RN Outcome: Progressing 06/13/2024 0422 by Brant Caldron, RN Outcome: Progressing Goal: Respiratory complications will improve 06/13/2024 0514 by Brant Caldron, RN Outcome: Progressing 06/13/2024 0422 by Brant Caldron, RN Outcome: Progressing Goal: Cardiovascular complication will be avoided 06/13/2024 0514 by Brant Caldron, RN Outcome: Progressing 06/13/2024 0422 by Brant Caldron, RN Outcome: Progressing   Problem: Activity: Goal: Risk for activity intolerance will decrease 06/13/2024 0514 by Brant Caldron, RN Outcome: Progressing 06/13/2024 0422 by  Brant Caldron, RN Outcome: Progressing   Problem: Nutrition: Goal: Adequate nutrition will be maintained 06/13/2024 0514 by Brant Caldron, RN Outcome: Progressing 06/13/2024 0422 by Brant Caldron, RN Outcome: Progressing   Problem: Coping: Goal: Level of anxiety will decrease 06/13/2024 0514 by Brant Caldron, RN Outcome: Progressing 06/13/2024 0422 by Brant Caldron, RN Outcome: Progressing   Problem: Elimination: Goal: Will not experience complications related to bowel motility 06/13/2024 0514 by Brant Caldron, RN Outcome: Progressing 06/13/2024 0422 by Brant Caldron, RN Outcome: Progressing Goal: Will not experience complications related to urinary retention 06/13/2024 0514 by Brant Caldron, RN Outcome: Progressing 06/13/2024 0422 by Brant Caldron, RN Outcome: Progressing   Problem: Pain Managment: Goal: General experience of comfort will improve and/or be controlled 06/13/2024 0514 by Brant Caldron, RN Outcome: Progressing 06/13/2024 0422 by Brant Caldron, RN Outcome: Progressing   Problem: Safety: Goal: Ability to remain free from injury will improve 06/13/2024 0514 by Brant Caldron, RN Outcome: Progressing 06/13/2024 0422 by Brant Caldron, RN Outcome: Progressing   Problem: Skin Integrity: Goal: Risk for impaired skin integrity will decrease 06/13/2024 0514 by Brant Caldron, RN Outcome: Progressing 06/13/2024 0422 by Brant Caldron, RN Outcome: Progressing   Problem: Safety: Goal: Non-violent Restraint(s) 06/13/2024 0514 by Brant Caldron, RN Outcome:  Progressing 06/13/2024 0422 by Brant Caldron, RN Outcome: Progressing

## 2024-06-13 NOTE — Progress Notes (Addendum)
 Physical Therapy Treatment Patient Details Name: Alison Campbell MRN: [id number] DOB: June 26, 1946 Today's Date: 06/13/2024   History of Present Illness Pt is an 78 y.o. female presenting 6/10 with acute onset aphasia. CTH reveals acute R temporal lobe hemorrhage. PMH significant for pulmonary fibrosis and HLD.    PT Comments  Pt making excellent progress towards her physical therapy goals, exhibiting improvement in balance, dual tasking, and cognition. Pt ambulating 400 ft with no assistive device and negotiated 3 steps without physical assist. Able to locate objects in left visual field in static standing and dynamically in hallway. Reports increase in groin pain towards end of walk; reports she pulled a muscle in April. In light of progression, updating to recommendation of HHPT.     If plan is discharge home, recommend the following: Assist for transportation   Can travel by private vehicle        Equipment Recommendations  None recommended by PT    Recommendations for Other Services       Precautions / Restrictions Precautions Precautions: Fall Restrictions Weight Bearing Restrictions Per Provider Order: No     Mobility  Bed Mobility Overal bed mobility: Modified Independent                  Transfers Overall transfer level: Needs assistance Equipment used: None Transfers: Sit to/from Stand Sit to Stand: Supervision                Ambulation/Gait Ambulation/Gait assistance: Supervision Gait Distance (Feet): 400 Feet Assistive device: None Gait Pattern/deviations: Step-through pattern, Decreased stride length           Stairs Stairs: Yes Stairs assistance: Supervision Stair Management: Two rails Number of Stairs: 3     Wheelchair Mobility     Tilt Bed    Modified Rankin (Stroke Patients Only) Modified Rankin (Stroke Patients Only) Pre-Morbid Rankin Score: No symptoms Modified Rankin: Moderate disability     Balance Overall  balance assessment: Needs assistance Sitting-balance support: Feet supported Sitting balance-Leahy Scale: Good     Standing balance support: No upper extremity supported, During functional activity Standing balance-Leahy Scale: Good                              Communication Communication Communication: No apparent difficulties  Cognition Arousal: Alert Behavior During Therapy: WFL for tasks assessed/performed   PT - Cognitive impairments: Memory, Problem solving                       PT - Cognition Comments: Pt able to participate in dual tasking i.e. calling out room numbers on L. Follows multi step commands. Able to problem solve through several tasks with min cueing Following commands: Intact      Cueing Cueing Techniques: Verbal cues  Exercises      General Comments        Pertinent Vitals/Pain Pain Assessment Pain Assessment: No/denies pain    Home Living                          Prior Function            PT Goals (current goals can now be found in the care plan section) Acute Rehab PT Goals Patient Stated Goal: did not state Potential to Achieve Goals: Good Progress towards PT goals: Progressing toward goals    Frequency    Min 2X/week  PT Plan      Co-evaluation              AM-PAC PT 6 Clicks Mobility   Outcome Measure  Help needed turning from your back to your side while in a flat bed without using bedrails?: None Help needed moving from lying on your back to sitting on the side of a flat bed without using bedrails?: None Help needed moving to and from a bed to a chair (including a wheelchair)?: A Little Help needed standing up from a chair using your arms (e.g., wheelchair or bedside chair)?: A Little Help needed to walk in hospital room?: A Little Help needed climbing 3-5 steps with a railing? : A Little 6 Click Score: 20    End of Session Equipment Utilized During Treatment: Gait  belt Activity Tolerance: Patient tolerated treatment well Patient left: in chair;with call bell/phone within reach;with chair alarm set   PT Visit Diagnosis: Unsteadiness on feet (R26.81)     Time: 4098-1191 PT Time Calculation (min) (ACUTE ONLY): 20 min  Charges:    $Therapeutic Activity: 8-22 mins PT General Charges $$ ACUTE PT VISIT: 1 Visit                     Verdia Glad, PT, DPT Acute Rehabilitation Services Office [phone]    Claria Crofts 06/13/2024, 3:03 PM

## 2024-06-13 NOTE — TOC Initial Note (Signed)
 Transition of Care [facility]) - Initial/Assessment Note    Patient Details  Name: Alison Campbell MRN: [id number] Date of Birth: 12/10/1946  [facility] CM/SW Contact:    Jonathan Neighbor, RN Phone Number: 06/13/2024, 3:07 PM  Clinical Narrative:                  Pt is doing much better with therapies and recommendations have changed to home with home health services. Home health arranged with [facility]. [facility] will contact her for the first home visit.  Daughter is going to take a few days off to bridge her mother getting home. Pts son (that she lives with) is currently out of town.  Daughter to provide transportation home when d/ced.   Expected Discharge Plan: Skilled Nursing Facility Barriers to Discharge: Continued Medical Work up   Patient Goals and CMS Choice   CMS Medicare.gov Compare Post Acute Care list provided to:: Patient Choice offered to / list presented to : Patient, Adult Children      Expected Discharge Plan and Services In-house Referral: Clinical Social Work Discharge Planning Services: CM Consult Post Acute Care Choice: Home Health Living arrangements for the past 2 months: Single Family Home Expected Discharge Date: 06/13/24                         HH Arranged: PT, OT, Speech Therapy HH Agency: [facility] Date [facility] Agency Contacted: 06/13/24   Representative spoke with at [facility] Agency: Loetta Ringer  Prior Living Arrangements/Services Living arrangements for the past 2 months: Single Family Home Lives with:: Self Patient language and need for interpreter reviewed:: Yes Do you feel safe going back to the place where you live?: Yes      Need for Family Participation in Patient Care: Yes (Comment) Care giver support system in place?: Yes (comment)   Criminal Activity/Legal Involvement Pertinent to Current Situation/Hospitalization: No - Comment as needed  Activities of Daily Living   ADL Screening (condition at time of  admission) Independently performs ADLs?: No Does the patient have a NEW difficulty with bathing/dressing/toileting/self-feeding that is expected to last >3 days?: No Does the patient have a NEW difficulty with getting in/out of bed, walking, or climbing stairs that is expected to last >3 days?: No Does the patient have a NEW difficulty with communication that is expected to last >3 days?: Yes (Initiates electronic notice to provider for possible SLP consult) Is the patient deaf or have difficulty hearing?: No Does the patient have difficulty seeing, even when wearing glasses/contacts?: No Does the patient have difficulty concentrating, remembering, or making decisions?: Yes  Permission Sought/Granted Permission sought to share information with : Facility Medical sales representative, Family Supports Permission granted to share information with : Yes, Verbal Permission Granted  Share Information with NAME: [name]     Permission granted to share info w Relationship: Daughter  Permission granted to share info w Contact Information: [phone]  Emotional Assessment Appearance:: Appears stated age Attitude/Demeanor/Rapport: Engaged Affect (typically observed): Accepting Orientation: : Oriented to Self, Oriented to Place, Oriented to  Time, Oriented to Situation Alcohol / Substance Use: Not Applicable Psych Involvement: No (comment)  Admission diagnosis:  ICH (intracerebral hemorrhage) (HCC) [I61.9] Patient Active Problem List   Diagnosis Date Noted   ICH (intracerebral hemorrhage) (HCC) 06/07/2024   Right patella fracture 10/30/2023   ILD (interstitial lung disease) (HCC) 08/15/2022   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/14/2022   BMI 25.0-25.9,adult 12/24/2020   CKD (chronic kidney disease)  stage 3, GFR 30-59 ml/min (HCC) 12/24/2020   Insomnia    Dyshidrotic foot dermatitis    Aortic atherosclerosis (HCC) by CT Lung Scan in July 2021 03/11/2018   Tobacco use disorder 06/21/2015   Osteoporosis 06/21/2015    Essential hypertension 11/07/2013   Hyperlipidemia, mixed 11/07/2013   Abnormal glucose 11/07/2013   Chronic bronchitis (HCC) 11/07/2013   Vitamin D  deficiency 11/07/2013   Diverticulosis of large intestine 09/10/2011   History of colon polyps 09/10/2011   PCP:  Patient, No Pcp Per Pharmacy:   [id number] [place], [place] - [address] AT [facility] OF [address] & [facility] [address] [place] [address] Phone: [phone] Fax: [phone]  [place] [facility] [address] [place] [address] Phone: [phone] Fax: [phone]  [id number] [place], [place] - [address] [facility] [address] [place] [address] Phone: [phone] Fax: [phone]     Social Drivers of Health (SDOH) Social History: SDOH Screenings   Food Insecurity: No Food Insecurity (06/07/2024)  Housing: Low Risk  (06/07/2024)  Transportation Needs: No Transportation Needs (06/07/2024)  Utilities: Not At Risk (06/07/2024)  Depression (PHQ2-9): Low Risk  (10/25/2023)  Social Connections: Moderately Integrated (06/07/2024)  Tobacco Use: High Risk (06/07/2024)   SDOH Interventions:     Readmission Risk Interventions     No data to display

## 2024-06-13 NOTE — Progress Notes (Signed)
 Speech Language Pathology Treatment: Cognitive-Linguistic  Patient Details Name: Alison Campbell MRN: [id number] DOB: Jul 31, 1946 Today's Date: 06/13/2024 Time: 0102-7253 SLP Time Calculation (min) (ACUTE ONLY): 19 min  Assessment / Plan / Recommendation Clinical Impression  Pt's cognition reassessed after PT reports improving mentation given resolution of contrast-induced encephalopathy. Portions of the Cognistat was administered with pt scoring WFL on subsections related to attention, calculations, auditory comprehension, judgement, and expressive language. Areas of weakness include delayed recall and abstract reasoning. She recalled 3/4 words after ~5 minute delay and accurately recalled the fourth word given Min semantic cueing. Concrete reasoning appears WFL. Pt states her daughter is moving in to provide supervision. Called pt's daughter, Alison Campbell, to discuss pt's improved performance and recommendations for supervision post-acutely. Ongoing SLP f/u on an OP basis is recommended to target more complex cognitive goals given pt's prior level of independence.    HPI HPI: Patient is an 78 y.o. female with PMH: pulmonary fibrosis, HLD. She presented to the hospital on 06/07/24 with acute onset of aphasia and patient reporting a right sided headache which started the day prior to admission. At baseline, patient is independent with ADL's and still drives. CT head revealed acute right temporal lobe hemorrhage. MRI was motion degraded but showed intraparenchymal hemorrhage involving the right temporal lobe. Surrounding edema without significant regional mass effect. ST evaluated and recommended f/u for cognitive/linguistic deficits in acute setting.      SLP Plan  Continue with current plan of care          Recommendations                         Intermittent Supervision/Assistance Cognitive communication deficit (G64.403)     Continue with current plan of care     Alison Campbell,  M.A., CCC-SLP Speech Language Pathology, Acute Rehabilitation Services  Secure Chat preferred [phone]   06/13/2024, 1:44 PM

## 2024-06-13 NOTE — Plan of Care (Signed)
  Problem: Education: Goal: Knowledge of disease or condition will improve Outcome: Progressing Goal: Knowledge of secondary prevention will improve (MUST DOCUMENT ALL) Outcome: Progressing Goal: Knowledge of patient specific risk factors will improve (DELETE if not current risk factor) Outcome: Progressing   Problem: Intracerebral Hemorrhage Tissue Perfusion: Goal: Complications of Intracerebral Hemorrhage will be minimized Outcome: Progressing   Problem: Coping: Goal: Will verbalize positive feelings about self Outcome: Progressing Goal: Will identify appropriate support needs Outcome: Progressing   Problem: Health Behavior/Discharge Planning: Goal: Ability to manage health-related needs will improve Outcome: Progressing Goal: Goals will be collaboratively established with patient/family Outcome: Progressing   Problem: Self-Care: Goal: Ability to participate in self-care as condition permits will improve Outcome: Progressing Goal: Verbalization of feelings and concerns over difficulty with self-care will improve Outcome: Progressing Goal: Ability to communicate needs accurately will improve Outcome: Progressing   Problem: Nutrition: Goal: Risk of aspiration will decrease Outcome: Progressing Goal: Dietary intake will improve Outcome: Progressing   Problem: Education: Goal: Knowledge of General Education information will improve Description: Including pain rating scale, medication(s)/side effects and non-pharmacologic comfort measures Outcome: Progressing   Problem: Health Behavior/Discharge Planning: Goal: Ability to manage health-related needs will improve Outcome: Progressing   Problem: Clinical Measurements: Goal: Ability to maintain clinical measurements within normal limits will improve Outcome: Progressing Goal: Will remain free from infection Outcome: Progressing Goal: Diagnostic test results will improve Outcome: Progressing Goal: Respiratory  complications will improve Outcome: Progressing Goal: Cardiovascular complication will be avoided Outcome: Progressing   Problem: Activity: Goal: Risk for activity intolerance will decrease Outcome: Progressing   Problem: Nutrition: Goal: Adequate nutrition will be maintained Outcome: Progressing   Problem: Coping: Goal: Level of anxiety will decrease Outcome: Progressing   Problem: Elimination: Goal: Will not experience complications related to bowel motility Outcome: Progressing Goal: Will not experience complications related to urinary retention Outcome: Progressing   Problem: Pain Managment: Goal: General experience of comfort will improve and/or be controlled Outcome: Progressing   Problem: Safety: Goal: Ability to remain free from injury will improve Outcome: Progressing   Problem: Skin Integrity: Goal: Risk for impaired skin integrity will decrease Outcome: Progressing   Problem: Safety: Goal: Non-violent Restraint(s) Outcome: Progressing

## 2024-06-13 NOTE — Telephone Encounter (Signed)
 FYI for MR-   This is Alison Exon, RN - Alison Campbell's daughter and Healthcare POA. Mom suffered a stroke on 06/07/24. She is being discharged today or tomorrow. DME reached out to me to schedule but we were unable to do so due to her stroke. Please ask them to reach out again to schedule the overnight test.    They will need to reach out to me to do so: [phone]. I will accept VM and TEXT on this number.    Sincerely,  Georgeann Kindred. Hildy Lowers, MSN, RN, RNFA, CNOR, [facility]

## 2024-06-13 NOTE — Care Management Important Message (Signed)
 Important Message  Patient Details  Name: Alison Campbell MRN: [id number] Date of Birth: 1946-07-26   Important Message Given:  Yes - Medicare IM     Wynonia Hedges 06/13/2024, 4:29 PM

## 2024-06-13 NOTE — Telephone Encounter (Signed)
 Patient's daughter was called about scheduled apt. She stated she would like to keep both apt with Dr Bertrum Brodie . Order for ONO was placed and she was told to call [facility] to schedule. NFN  Copied from CRM [id number]. Topic: Appointments - Scheduling Inquiry for Clinic >> Jun 07, 2024  8:47 AM Margarette Shawl wrote: Reason for CRM:   [name], [name], contacted office about 6-min walk test scheduled for today. Pt is not feeling well and will need to reschedule. [facility] does not have access to this scheduling.   [name] is requesting pt's 6 min walk test be rescheduled. She reports pt also needs her ONO testing scheduled as well.   CB#  [phone]  -Please leave detailed message if pt does not answer. [name] is requesting that if pt can't be reached, to go ahead and just schedule 6-min walk test and ONO and let pt know when appts are set for, so that this can prevent phone tag.

## 2024-06-14 ENCOUNTER — Emergency Department (HOSPITAL_COMMUNITY)
Admission: EM | Admit: 2024-06-14 | Discharge: 2024-06-14 | Disposition: A | Discharge status: Home / Self Care | Attending: Emergency Medicine | Admitting: Emergency Medicine

## 2024-06-14 ENCOUNTER — Emergency Department (HOSPITAL_COMMUNITY)

## 2024-06-14 ENCOUNTER — Encounter (HOSPITAL_COMMUNITY): Payer: Self-pay

## 2024-06-14 ENCOUNTER — Other Ambulatory Visit: Payer: Self-pay

## 2024-06-14 DIAGNOSIS — I10 Essential (primary) hypertension: Secondary | ICD-10-CM | POA: Insufficient documentation

## 2024-06-14 DIAGNOSIS — R29818 Other symptoms and signs involving the nervous system: Secondary | ICD-10-CM | POA: Diagnosis not present

## 2024-06-14 DIAGNOSIS — R519 Headache, unspecified: Secondary | ICD-10-CM | POA: Diagnosis not present

## 2024-06-14 DIAGNOSIS — I611 Nontraumatic intracerebral hemorrhage in hemisphere, cortical: Secondary | ICD-10-CM | POA: Diagnosis not present

## 2024-06-14 DIAGNOSIS — R609 Edema, unspecified: Secondary | ICD-10-CM | POA: Diagnosis not present

## 2024-06-14 DIAGNOSIS — Z79899 Other long term (current) drug therapy: Secondary | ICD-10-CM | POA: Diagnosis not present

## 2024-06-14 DIAGNOSIS — Z8673 Personal history of transient ischemic attack (TIA), and cerebral infarction without residual deficits: Secondary | ICD-10-CM | POA: Diagnosis not present

## 2024-06-14 LAB — DIFFERENTIAL
Abs Immature Granulocytes: 0.06 10*3/uL (ref 0.00–0.07)
Basophils Absolute: 0 10*3/uL (ref 0.0–0.1)
Basophils Relative: 0 %
Eosinophils Absolute: 0.3 10*3/uL (ref 0.0–0.5)
Eosinophils Relative: 2 %
Immature Granulocytes: 0 %
Lymphocytes Relative: 14 %
Lymphs Abs: 1.9 10*3/uL (ref 0.7–4.0)
Monocytes Absolute: 1.3 10*3/uL — ABNORMAL HIGH (ref 0.1–1.0)
Monocytes Relative: 10 %
Neutro Abs: 10.3 10*3/uL — ABNORMAL HIGH (ref 1.7–7.7)
Neutrophils Relative %: 74 %

## 2024-06-14 LAB — I-STAT CHEM 8, ED
BUN: 29 mg/dL — ABNORMAL HIGH (ref 8–23)
Calcium, Ion: 1.24 mmol/L (ref 1.15–1.40)
Chloride: 106 mmol/L (ref 98–111)
Creatinine, Ser: 1 mg/dL (ref 0.44–1.00)
Glucose, Bld: 101 mg/dL — ABNORMAL HIGH (ref 70–99)
HCT: 41 % (ref 36.0–46.0)
Hemoglobin: 13.9 g/dL (ref 12.0–15.0)
Potassium: 3.9 mmol/L (ref 3.5–5.1)
Sodium: 139 mmol/L (ref 135–145)
TCO2: 23 mmol/L (ref 22–32)

## 2024-06-14 LAB — CBC
HCT: 41 % (ref 36.0–46.0)
Hemoglobin: 13.2 g/dL (ref 12.0–15.0)
MCH: 30.8 pg (ref 26.0–34.0)
MCHC: 32.2 g/dL (ref 30.0–36.0)
MCV: 95.6 fL (ref 80.0–100.0)
Platelets: 259 10*3/uL (ref 150–400)
RBC: 4.29 MIL/uL (ref 3.87–5.11)
RDW: 13.5 % (ref 11.5–15.5)
WBC: 13.9 10*3/uL — ABNORMAL HIGH (ref 4.0–10.5)
nRBC: 0 % (ref 0.0–0.2)

## 2024-06-14 LAB — COMPREHENSIVE METABOLIC PANEL WITH GFR
ALT: 32 U/L (ref 0–44)
AST: 22 U/L (ref 15–41)
Albumin: 3.6 g/dL (ref 3.5–5.0)
Alkaline Phosphatase: 62 U/L (ref 38–126)
Anion gap: 11 (ref 5–15)
BUN: 26 mg/dL — ABNORMAL HIGH (ref 8–23)
CO2: 23 mmol/L (ref 22–32)
Calcium: 9.7 mg/dL (ref 8.9–10.3)
Chloride: 104 mmol/L (ref 98–111)
Creatinine, Ser: 1.09 mg/dL — ABNORMAL HIGH (ref 0.44–1.00)
GFR, Estimated: 51 mL/min — ABNORMAL LOW (ref >60)
Glucose, Bld: 98 mg/dL (ref 70–99)
Potassium: 4 mmol/L (ref 3.5–5.1)
Sodium: 138 mmol/L (ref 135–145)
Total Bilirubin: 0.4 mg/dL (ref 0.0–1.2)
Total Protein: 6.9 g/dL (ref 6.5–8.1)

## 2024-06-14 LAB — PROTIME-INR
INR: 0.9 (ref 0.8–1.2)
Prothrombin Time: 12.3 s (ref 11.4–15.2)

## 2024-06-14 LAB — APTT: aPTT: 27 s (ref 24–36)

## 2024-06-14 LAB — CBG MONITORING, ED: Glucose-Capillary: 115 mg/dL — ABNORMAL HIGH (ref 70–99)

## 2024-06-14 MED ORDER — SODIUM CHLORIDE 0.9% FLUSH
3.0000 mL | Freq: Once | INTRAVENOUS | Status: AC
  Administered 2024-06-14: 3 mL via INTRAVENOUS

## 2024-06-14 NOTE — ED Provider Triage Note (Signed)
 Emergency Medicine Provider Triage Evaluation Note  Alison Campbell , a 78 y.o. female  was evaluated in triage.  Pt complains of HTN.  Review of Systems  Positive: headache Negative: Vomiting, confusion, ataxia, aphasia  Physical Exam  BP 135/77   Pulse 91   Temp 97.8 F (36.6 C)   Resp 16   Ht 5' 3.5 (1.613 m)   Wt 59 kg   SpO2 98%   BMI 22.67 kg/m  Gen:   Awake, no distress   Resp:  Normal effort  MSK:   Moves extremities without difficulty  Other:    Medical Decision Making  Medically screening exam initiated at 7:40 PM.  Appropriate orders placed.  Jae Bruck was informed that the remainder of the evaluation will be completed by another provider, this initial triage assessment does not replace that evaluation, and the importance of remaining in the ED until their evaluation is complete.  Recent hemorrhagic stroke, discharged home today. Daughter took her blood pressure today and found it elevated today. The patient also complained of a right sided headache. No vomiting. Symptoms with the stroke included severe headache, confusion, unable to dress herself, AMS. No similar symptoms today. Daughter reports she gave an 2nd 10 mg Norvasc  around 2:00 when blood pressure was elevated.    Mandy Second, PA-C 06/14/24 1943

## 2024-06-14 NOTE — ED Triage Notes (Signed)
 Pt reports elevated BP 162/102 at home today. She was d/c'd from [facility] today s/p intraparenchymal hemorrhage at the peripheral right temporal lobe. Her doctor told her to come to the ER to make sure she has not developed another brain bleed. She denies additional neuro deficits other than complains of a headache.

## 2024-06-14 NOTE — ED Notes (Signed)
 Patient discharged in stable condition, education materials explained including, follow up, any prescriptions and reasons to return. Patient voiced agreement to education and discharge material.

## 2024-06-14 NOTE — ED Provider Notes (Signed)
 [place] EMERGENCY DEPARTMENT AT [facility] Provider Note   CSN: [id number] Arrival date & time: 06/14/24  1841     Patient presents with: Hypertension   Alison Campbell is a 78 y.o. female.   Patient presents to the emergency department for evaluation of elevated blood pressure.  Patient was just discharged from the hospital after having a nontraumatic intraparenchymal bleed.  Blood pressure was 160/100 at home.  She was to start amlodipine  in the morning.  Her daughter did give her a dose of the amlodipine  but when she called the floor to ask what she should do, they told her to come back to the ED to be evaluated.       Prior to Admission medications   Medication Sig Start Date End Date Taking? Authorizing Provider  acetaminophen  (TYLENOL ) 500 MG tablet Take 1,000 mg by mouth daily as needed for mild pain (pain score 1-3), moderate pain (pain score 4-6) or headache.    [provider]  amLODipine  (NORVASC ) 10 MG tablet Take 1 tablet (10 mg total) by mouth daily. 06/14/24   Regalado, Belkys A, MD  Calcium  Carb-Cholecalciferol  (CALCIUM  600 + D PO) Take 2 tablets by mouth daily.    [provider]  cholecalciferol  (VITAMIN D3) 25 MCG (1000 UNIT) tablet Take 5,000 Units by mouth daily.    [provider]  ezetimibe  (ZETIA ) 10 MG tablet TAKE 1 TABLET BY MOUTH DAILY FOR CHOLESTEROL 12/11/21   Vangie Genet, MD  Multiple Vitamin (MULTIVITAMIN PO) Take 1 tablet by mouth daily.    [provider]  nicotine  (NICODERM CQ  - DOSED IN MG/24 HOURS) 14 mg/24hr patch Place 1 patch (14 mg total) onto the skin daily. 06/14/24   Regalado, Belkys A, MD  pantoprazole  (PROTONIX ) 40 MG tablet Take 1 tablet (40 mg total) by mouth at bedtime. 06/13/24   Regalado, Clifford Dam A, MD  Pirfenidone  267 MG TABS Take 2 tablets (534 mg total) by mouth with breakfast, with lunch, and with evening meal. 03/24/24   Maire Scot, MD  Psyllium (METAMUCIL FIBER PO) Take 1  tablet by mouth daily.    [provider]  QUEtiapine  (SEROQUEL ) 50 MG tablet Take 1 tablet (50 mg total) by mouth at bedtime. 06/13/24   Regalado, Belkys A, MD  rosuvastatin  (CRESTOR ) 20 MG tablet Take 1/2 tablet (10 mg) 3 x / week for Cholesterol 05/11/23   Vangie Genet, MD    Allergies: Omnipaque  [iohexol ], Fish oil, Iodinated contrast media, and Spiriva  respimat [tiotropium bromide monohydrate ]    Review of Systems  Updated Vital Signs BP 118/63   Pulse 81   Temp 98 F (36.7 C) (Oral)   Resp 20   Ht 5' 3.5 (1.613 m)   Wt 59 kg   SpO2 100%   BMI 22.67 kg/m   Physical Exam Vitals and nursing note reviewed.  Constitutional:      General: She is not in acute distress.    Appearance: She is well-developed.  HENT:     Head: Normocephalic and atraumatic.     Mouth/Throat:     Mouth: Mucous membranes are moist.   Eyes:     General: Vision grossly intact. Gaze aligned appropriately.     Extraocular Movements: Extraocular movements intact.     Conjunctiva/sclera: Conjunctivae normal.    Cardiovascular:     Rate and Rhythm: Normal rate and regular rhythm.     Pulses: Normal pulses.     Heart sounds: Normal heart sounds, S1 normal and  S2 normal. No murmur heard.    No friction rub. No gallop.  Pulmonary:     Effort: Pulmonary effort is normal. No respiratory distress.     Breath sounds: Normal breath sounds.  Abdominal:     General: Bowel sounds are normal.     Palpations: Abdomen is soft.     Tenderness: There is no abdominal tenderness. There is no guarding or rebound.     Hernia: No hernia is present.   Musculoskeletal:        General: No swelling.     Cervical back: Full passive range of motion without pain, normal range of motion and neck supple. No spinous process tenderness or muscular tenderness. Normal range of motion.     Right lower leg: No edema.     Left lower leg: No edema.   Skin:    General: Skin is warm and dry.     Capillary Refill:  Capillary refill takes less than 2 seconds.     Findings: No ecchymosis, erythema, rash or wound.   Neurological:     General: No focal deficit present.     Mental Status: She is alert and oriented to person, place, and time.     GCS: GCS eye subscore is 4. GCS verbal subscore is 5. GCS motor subscore is 6.     Cranial Nerves: Cranial nerves 2-12 are intact.     Sensory: Sensation is intact.     Motor: Motor function is intact.     Coordination: Coordination is intact.   Psychiatric:        Attention and Perception: Attention normal.        Mood and Affect: Mood normal.        Speech: Speech normal.        Behavior: Behavior normal.     (all labs ordered are listed, but only abnormal results are displayed) Labs Reviewed  CBC - Abnormal; Notable for the following components:      Result Value   WBC 13.9 (*)    All other components within normal limits  COMPREHENSIVE METABOLIC PANEL WITH GFR - Abnormal; Notable for the following components:   BUN 26 (*)    Creatinine, Ser 1.09 (*)    GFR, Estimated 51 (*)    All other components within normal limits  DIFFERENTIAL - Abnormal; Notable for the following components:   Neutro Abs 10.3 (*)    Monocytes Absolute 1.3 (*)    All other components within normal limits  I-STAT CHEM 8, ED - Abnormal; Notable for the following components:   BUN 29 (*)    Glucose, Bld 101 (*)    All other components within normal limits  CBG MONITORING, ED - Abnormal; Notable for the following components:   Glucose-Capillary 115 (*)    All other components within normal limits  PROTIME-INR  APTT  ETHANOL    EKG: EKG Interpretation Date/Time:  Tuesday June 14 2024 19:04:15 EDT Ventricular Rate:  88 PR Interval:  138 QRS Duration:  82 QT Interval:  370 QTC Calculation: 447 R Axis:   39  Text Interpretation: Normal sinus rhythm Normal ECG When compared with ECG of 07-Jun-2024 15:38, PREVIOUS ECG IS PRESENT when compared to prior, similar  appearance No STEMI Confirmed by Wynell Heath ([id number]) on 06/14/2024 10:11:37 PM  Radiology: CT HEAD WO CONTRAST Result Date: 06/14/2024 CLINICAL DATA:  Neuro deficit, acute, stroke suspected. EXAM: CT HEAD WITHOUT CONTRAST TECHNIQUE: Contiguous axial images were obtained from the base of  the skull through the vertex without intravenous contrast. RADIATION DOSE REDUCTION: This exam was performed according to the departmental dose-optimization program which includes automated exposure control, adjustment of the mA and/or kV according to patient size and/or use of iterative reconstruction technique. COMPARISON:  CT head June 11 25. FINDINGS: Brain: Similar versus slightly decreased size of the intraparenchymal hemorrhage along the peripheral aspect of the right temporal lobe with decreased attenuation. Similar surrounding edema and local mass effect. No midline shift. No evidence of acute large vascular territory infarct, new hemorrhage, or hydrocephalus. Patchy white matter hypodensities are nonspecific but compatible with chronic microvascular ischemic disease. Vascular: Calcific atherosclerosis. Skull: No acute fracture. Sinuses/Orbits: Mostly clear sinuses.  No acute findings. Other: No mastoid effusions. IMPRESSION: Similar versus slightly decreased size of the intraparenchymal hemorrhage along the peripheral aspect of the right temporal lobe. Similar surrounding edema and local mass effect. Electronically Signed   By: Stevenson Elbe M.D.   On: 06/14/2024 20:30     Procedures   Medications Ordered in the ED  sodium chloride  flush (NS) 0.9 % injection 3 mL (3 mLs Intravenous Given 06/14/24 2140)                                    Medical Decision Making Amount and/or Complexity of Data Reviewed Labs: ordered. Radiology: ordered.   Differential diagnosis considered includes, but not limited to: Benign hypertension; hypertensive urgency; worsening intraparenchymal bleed  Patient presents  to the emergency department for elevated blood pressure.  Patient concerned because her blood pressure was above 160 at home and she was just discharged from the hospital after suffering an upper intraparenchymal bleed.  Patient's lab work is unremarkable.  Patient's blood pressure has significantly improved, now at 120/50.  CT head shows similar or slightly decreased intraparenchymal bleed.  Workup is very reassuring.  She has a normal, nonfocal neurologic exam.  She will be appropriate for discharge.  She was instructed that she can take her amlodipine  in the morning.     Final diagnoses:  Primary hypertension    ED Discharge Orders     None          Ballard Bongo, MD 06/14/24 2313

## 2024-06-14 NOTE — Discharge Summary (Signed)
 Physician Discharge Summary   Patient: Alison Campbell MRN: [id number] DOB: [birth year]  Admit date:     06/07/2024  Discharge date: 06/14/24  Discharge Physician: Danette Duos   PCP: Patient, No Pcp Per   Recommendations at discharge:    Needs close follow up with neurology post discharge  Discharge Diagnoses: Principal Problem:   ICH (intracerebral hemorrhage) (HCC)  Resolved Problems:   * No resolved hospital problems. [facility] Course: 78 year old with past medical hyperlipidemia, pulmonary fibrosis, CKD stage III, prediabetes, COPD, anxiety who presented on 6/10 with aphasia evaluation in the ED CT head revealed acute right temporal lobe hemorrhage. Patient was admitted by neurology. Repeated CT 6/11 showed stable hematoma without extension or other acute abnormality. CTA head and neck no large vessel occlusion or AVM or aneurysmal. CTP no venous sinus thrombosis. Patient became agitated post CT. Found to have some contrast-induced encephalopathy. EEG was negative. Patient was on Precedex . Subsequently Precedex  was discontinue because of oversedation. Has remained stable she was transferred out of the ICU and her care has been transferred to [facility] starting 6/14.   Assessment and Plan: 1-ICH, posterior right temporal small ICH etiology unclear. - CT head: 2.5 x 2.3 x 1.8 cm (5.2 mL) acute parenchymal hemorrhage within the posterior right temporal lobe with mild surrounding edema - MRI/MRA brain: Grossly stable size and appearance of intraparenchymal hemorrhage involving the right temporal lobe. Surrounding edema without significant regional mass effect. - Repeated CT head hematoma stable. Grossly negative intracranial MRA  - CT venogram venous thrombosis. - MRI with contrast was negative for abnormal enhancement. - 2D echo ejection fraction 60 to 65%, LDL 63, A1c 5.6 No antithrombotic due to ICH. Stable, discharge delay, because we needed to arrange safe  discharge plan. Stable to discharge home    Questionable contrast-induced encephalopathy Agitation/restlessness resolved -Developed increased agitation post CTA CTP 6/11 - Received Haldol /Ativan  and subsequently was on Precedex  drip.  Now off -. CT at that time was stable.  -Improved, alert and conversant.    Hypertension: Pressure goal less than 160 -Started  on amlodipine    Hyperlipidemia: Zetia  and Crestor  (3 times a week.) Currently holding statins.      Tobacco abuse;  -Nicotine   patch.    Hypokalemia -Replaced.    Urinary retention Required In and out times two.  Monitor.    COPD: PRN Albuterol    CKD stage IIIa: cr stable/    Anxiety Seroquel  at hs   Pulmonary fibrosis Stable. RA   Estimated body mass index is 24.53 kg/m as calculated from the following:   Height as of this encounter: 5' 3 (1.6 m).   Weight as of this encounter: 62.8 kg.          Consultants: Neurology  Procedures performed: ECHO Disposition: Home Diet recommendation:  Discharge Diet Orders (From admission, onward)     Start     Ordered   06/13/24 0000  Diet - low sodium heart healthy        06/13/24 1032           Cardiac diet DISCHARGE MEDICATION: Allergies as of 06/14/2024       Reactions   Omnipaque  [iohexol ] Other (See Comments)   EXTREME AGITATION   Fish Oil Diarrhea   Iodinated Contrast Media    Spiriva  Respimat [tiotropium Bromide Monohydrate ] Cough   Gaging and excessive phlegm        Medication List     STOP taking these medications    meloxicam   15 MG tablet Commonly known as: MOBIC    traZODone  150 MG tablet Commonly known as: DESYREL        TAKE these medications    acetaminophen  500 MG tablet Commonly known as: TYLENOL  Take 1,000 mg by mouth daily as needed for mild pain (pain score 1-3), moderate pain (pain score 4-6) or headache.   amLODipine  10 MG tablet Commonly known as: NORVASC  Take 1 tablet (10 mg total) by mouth daily.    CALCIUM  600 + D PO Take 2 tablets by mouth daily.   cholecalciferol  25 MCG (1000 UNIT) tablet Commonly known as: VITAMIN D3 Take 5,000 Units by mouth daily.   ezetimibe  10 MG tablet Commonly known as: ZETIA  TAKE 1 TABLET BY MOUTH DAILY FOR CHOLESTEROL   METAMUCIL FIBER PO Take 1 tablet by mouth daily.   MULTIVITAMIN PO Take 1 tablet by mouth daily.   nicotine  14 mg/24hr patch Commonly known as: NICODERM CQ  - dosed in mg/24 hours Place 1 patch (14 mg total) onto the skin daily.   pantoprazole  40 MG tablet Commonly known as: PROTONIX  Take 1 tablet (40 mg total) by mouth at bedtime.   Pirfenidone  267 MG Tabs Take 2 tablets (534 mg total) by mouth with breakfast, with lunch, and with evening meal.   QUEtiapine  50 MG tablet Commonly known as: SEROQUEL  Take 1 tablet (50 mg total) by mouth at bedtime.   rosuvastatin  20 MG tablet Commonly known as: CRESTOR  Take 1/2 tablet (10 mg) 3 x / week for Cholesterol        Follow-up Information     [facility]. Schedule an appointment as soon as possible for a visit in 2 month(s).   Specialty: Neurology Why: Please call and make an appointment for 8 weeks after discharge Contact information: [address] [place]  [address] [phone]        [facility] Follow up.   Specialty: Home Health Services Why: [facility] will contact you for the first home visit Contact information: [address] [place] [address] [phone]                Discharge Exam: Alison Campbell Weights   06/07/24 1520 06/07/24 1603 06/07/24 1630  Weight: 60.2 kg 60.2 kg 62.8 kg   General; NAD  Condition at discharge: stable  The results of significant diagnostics from this hospitalization (including imaging, microbiology, ancillary and laboratory) are listed below for reference.   Imaging Studies: MR BRAIN W CONTRAST Result Date: 06/10/2024 CLINICAL DATA:   Follow-up examination for hemorrhagic stroke. EXAM: MRI HEAD WITH CONTRAST TECHNIQUE: Multiplanar, multiecho pulse sequences of the brain and surrounding structures were obtained with intravenous contrast. CONTRAST:  6mL GADAVIST GADOBUTROL 1 MMOL/ML IV SOLN COMPARISON:  CT from 06/08/2024 as well as earlier studies. FINDINGS: Brain: Postcontrast imaging of the brain demonstrates no mass lesion or abnormal enhancement underlying the right temporal hemorrhage. Hemorrhage itself is relatively similar in size. No significant regional mass effect or midline shift. No other abnormal enhancement within the brain. No other new or progressive finding on this limited study. Vascular: Not well assessed on this limited exam. Skull and upper cervical spine: No new finding. Sinuses/Orbits: No new finding. Other: None. IMPRESSION: Negative postcontrast MRI of the brain. No mass lesion or abnormal enhancement underlying the right temporal hemorrhage. Electronically Signed   By: Virgia Griffins M.D.   On: 06/10/2024 19:40   CT HEAD WO CONTRAST ( ) Result Date: 06/08/2024 CLINICAL DATA:  Follow-up  examination for hemorrhagic stroke. EXAM: CT HEAD WITHOUT CONTRAST TECHNIQUE: Contiguous axial images were obtained from the base of the skull through the vertex without intravenous contrast. RADIATION DOSE REDUCTION: This exam was performed according to the departmental dose-optimization program which includes automated exposure control, adjustment of the mA and/or kV according to patient size and/or use of iterative reconstruction technique. COMPARISON:  Comparison made with CTs from earlier the same day as well as previous studies. FINDINGS: Brain: Previously identified intraparenchymal hemorrhage positioned at the peripheral right temporal lobe is not significantly changed in size or morphology from previous. Surrounding edema without significant regional mass effect or midline shift, similar. No other new acute intracranial  hemorrhage. No other acute large vessel territory infarct. No mass lesion or hydrocephalus. No extra-axial fluid collection. Underlying advanced cerebral white matter disease noted. Vascular: No abnormal hyperdense vessel. Scattered vascular calcifications noted within the carotid siphons. Skull: Scalp soft tissues and calvarium demonstrate no new finding. Sinuses/Orbits: Globes orbital soft tissues within normal limits. Paranasal sinuses are largely clear. Trace bilateral mastoid effusions noted, of doubtful significance. Other: None. IMPRESSION: 1. No significant interval change in size and morphology of intraparenchymal hemorrhage at the peripheral right temporal lobe. Surrounding edema without significant regional mass effect or midline shift. 2. No other new acute intracranial abnormality. 3. Underlying advanced cerebral white matter disease. Electronically Signed   By: Virgia Griffins M.D.   On: 06/08/2024 22:42   EEG adult Result Date: 06/08/2024 Arleene Lack, MD     06/08/2024 10:12 PM Patient Name: Alison Campbell MRN: [id number] Epilepsy Attending: Arleene Lack Referring Physician/Provider: Consuelo Denmark, MD Date: 06/08/2024 Duration: 24.03 mins Patient history: 78yo F with right temporal ICH. EEG to evaluate for seizure. Level of alertness: comatose/ lethargic AEDs during EEG study: Ativan , Versed  Technical aspects: This EEG study was done with scalp electrodes positioned according to the 10-20 International system of electrode placement. Electrical activity was reviewed with band pass filter of 1-70Hz , sensitivity of 7 uV/mm, display speed of 81mm/sec with a 60Hz  notched filter applied as appropriate. EEG data were recorded continuously and digitally stored.  Video monitoring was available and reviewed as appropriate. Description: EEG showed continuous generalized 3 to 6 Hz theta-delta slowing admixed with 15 to 18 Hz beta activity distributed symmetrically and diffusely. Hyperventilation  and photic stimulation were not performed.   ABNORMALITY - Continuous slow, generalized IMPRESSION: This study is suggestive of moderate to severe diffuse encephalopathy. No seizures or epileptiform discharges were seen throughout the recording. Arleene Lack   ECHOCARDIOGRAM COMPLETE Result Date: 06/08/2024    ECHOCARDIOGRAM REPORT   Patient Name:   Alison Campbell Date of Exam: 06/08/2024 Medical Rec #:  [id number]         Height:       63.0 in Accession #:    [id number]        Weight:       138.4 lb Date of Birth:  30-May-1946         BSA:          1.654 m Patient Age:    78 years          BP:           130/55 mmHg Patient Gender: F                 HR:           60 bpm. Exam Location:  Inpatient Procedure: 2D Echo, Color Doppler and Cardiac Doppler (  Both Spectral and Color            Flow Doppler were utilized during procedure). Indications:    Stroke  History:        Patient has no prior history of Echocardiogram examinations.  Sonographer:    Jeralene Mom Referring Phys: [id number] CORTNEY E DE LA TORRE IMPRESSIONS  1. Left ventricular ejection fraction, by estimation, is 60 to 65%. The left ventricle has normal function. The left ventricle has no regional wall motion abnormalities. There is moderate asymmetric left ventricular hypertrophy of the basal-septal segment. Left ventricular diastolic parameters were normal.  2. Right ventricular systolic function is normal. The right ventricular size is normal.  3. The mitral valve is normal in structure. Trivial mitral valve regurgitation. No evidence of mitral stenosis.  4. The aortic valve is tricuspid. There is mild calcification of the aortic valve. Aortic valve regurgitation is not visualized. Aortic valve sclerosis is present, with no evidence of aortic valve stenosis. Comparison(s): No prior Echocardiogram. Conclusion(s)/Recommendation(s): Normal biventricular function without evidence of hemodynamically significant valvular heart disease. No  intracardiac source of embolism detected on this transthoracic study. Consider a transesophageal echocardiogram to exclude cardiac source of embolism if clinically indicated. FINDINGS  Left Ventricle: Left ventricular ejection fraction, by estimation, is 60 to 65%. The left ventricle has normal function. The left ventricle has no regional wall motion abnormalities. The left ventricular internal cavity size was normal in size. There is  moderate asymmetric left ventricular hypertrophy of the basal-septal segment. Left ventricular diastolic parameters were normal. Right Ventricle: The right ventricular size is normal. No increase in right ventricular wall thickness. Right ventricular systolic function is normal. Left Atrium: Left atrial size was normal in size. Right Atrium: Right atrial size was normal in size. Pericardium: Trivial pericardial effusion is present. Mitral Valve: The mitral valve is normal in structure. Trivial mitral valve regurgitation. No evidence of mitral valve stenosis. Tricuspid Valve: The tricuspid valve is normal in structure. Tricuspid valve regurgitation is trivial. No evidence of tricuspid stenosis. Aortic Valve: The aortic valve is tricuspid. There is mild calcification of the aortic valve. Aortic valve regurgitation is not visualized. Aortic valve sclerosis is present, with no evidence of aortic valve stenosis. Aortic valve mean gradient measures 3.0 mmHg. Aortic valve peak gradient measures 5.6 mmHg. Aortic valve area, by VTI measures 1.86 cm. Pulmonic Valve: The pulmonic valve was not well visualized. Pulmonic valve regurgitation is not visualized. No evidence of pulmonic stenosis. Aorta: The aortic root and ascending aorta are structurally normal, with no evidence of dilitation. Venous: The inferior vena cava was not well visualized. IAS/Shunts: The atrial septum is grossly normal.  LEFT VENTRICLE PLAX 2D LVIDd:         3.90 cm   Diastology LVIDs:         2.50 cm   LV e' medial:     3.37 cm/s LV PW:         1.10 cm   LV E/e' medial:  11.8 LV IVS:        1.10 cm   LV e' lateral:   7.18 cm/s LVOT diam:     1.90 cm   LV E/e' lateral: 5.6 LV SV:         42 LV SV Index:   25 LVOT Area:     2.84 cm  RIGHT VENTRICLE RV Basal diam:  2.60 cm RV Mid diam:    2.30 cm RV S prime:     12.60 cm/s TAPSE (  M-mode): 2.0 cm LEFT ATRIUM             Index        RIGHT ATRIUM          Index LA diam:        2.70 cm 1.63 cm/m   RA Area:     8.70 cm LA Vol (A2C):   34.1 ml 20.62 ml/m  RA Volume:   14.60 ml 8.83 ml/m LA Vol (A4C):   25.0 ml 15.12 ml/m LA Biplane Vol: 30.7 ml 18.56 ml/m  AORTIC VALVE AV Area (Vmax):    1.79 cm AV Area (Vmean):   1.79 cm AV Area (VTI):     1.86 cm AV Vmax:           118.00 cm/s AV Vmean:          80.100 cm/s AV VTI:            0.224 m AV Peak Grad:      5.6 mmHg AV Mean Grad:      3.0 mmHg LVOT Vmax:         74.40 cm/s LVOT Vmean:        50.700 cm/s LVOT VTI:          0.147 m LVOT/AV VTI ratio: 0.66  AORTA Ao Root diam: 2.90 cm Ao Asc diam:  2.60 cm MITRAL VALVE MV Area (PHT): 3.48 cm    SHUNTS MV Decel Time: 218 msec    Systemic VTI:  0.15 m MV E velocity: 39.90 cm/s  Systemic Diam: 1.90 cm MV A velocity: 75.10 cm/s MV E/A ratio:  0.53 Sheryle Donning MD Electronically signed by Sheryle Donning MD Signature Date/Time: 06/08/2024/6:50:36 PM    Final    CT ANGIO HEAD NECK W WO CM Result Date: 06/08/2024 CLINICAL DATA:  Provided history: Stroke, hemorrhagic. EXAM: CT ANGIOGRAPHY HEAD AND NECK WITH AND WITHOUT CONTRAST CT VENOGRAM HEAD TECHNIQUE: Multidetector CT imaging of the head and neck was performed using the standard protocol during bolus administration of intravenous contrast. Multiplanar CT image reconstructions and MIPs were obtained to evaluate the vascular anatomy. Carotid stenosis measurements (when applicable) are obtained utilizing NASCET criteria, using the distal internal carotid diameter as the denominator. Venographic phase images of the brain were  obtained following the administration of intravenous contrast. Multiplanar reformats and maximum intensity projections were generated. RADIATION DOSE REDUCTION: This exam was performed according to the departmental dose-optimization program which includes automated exposure control, adjustment of the mA and/or kV according to patient size and/or use of iterative reconstruction technique. CONTRAST:  CONTRAST 75mL OMNIPAQUE  IOHEXOL  350 MG/ML SOLN COMPARISON:  MRI brain and MRA head 06/07/2024. Non-contrast head CT 06/07/2024. FINDINGS: CT HEAD FINDINGS Significantly motion degraded examination. Within this limitation, findings are swallows. Brain: Generalized cerebral atrophy. Unchanged size of an acute parenchymal hemorrhage within the posterior right temporal lobe, again measuring 2.9 x 2.4 cm (remeasured on prior). Surrounding edema is similar. No midline shift. Moderate-to-advanced patchy and ill-defined hypoattenuation elsewhere within the cerebral white matter, nonspecific but compatible chronic small vessel disease. Vascular: No hyperdense vessel.  Atherosclerotic calcifications. Skull: No calvarial fracture or aggressive osseous lesion. Sinuses/Orbits: No orbital mass or acute orbital finding. No significant paranasal sinus disease. Review of the MIP images confirms the above findings CTA NECK FINDINGS Aortic arch: Standard aortic branching. Atherosclerotic plaque within the visualized thoracic aorta and proximal major branch vessels of the neck. Streak/beam hardening artifact arising from a dense contrast bolus partially obscures the right subclavian  artery. Within this limitation, there is no appreciable hemodynamically significant innominate or proximal subclavian artery stenosis. Right carotid system: CCA and ICA patent within the neck without hemodynamically significant stenosis (50% or greater). Atherosclerotic plaque, greatest the carotid bifurcation and within the proximal ICA. Left carotid system: CCA  and ICA patent within the neck without hemodynamically significant stenosis (50% or greater). Atherosclerotic plaque, greatest within the distal CCA and about the carotid bifurcation. Vertebral arteries: Patent within the neck without stenosis. The left vertebral artery is dominant. Skeleton: Levocurvature of the cervical spine. Dextrocurvature of the upper thoracic spine, partially imaged. Cervical spondylosis. Other neck: Multiple thyroid  nodules, the largest within the right lobe measuring 1.7 cm. Upper chest: No consolidation within the imaged lung apices. Prior right upper/lower lobe lung biopsies. Emphysema. Review of the MIP images confirms the above findings CTA HEAD FINDINGS Anterior circulation: The intracranial internal carotid arteries are patent. Non-stenotic calcified plaque within both vessels. The M1 middle cerebral arteries are patent. No M2 proximal branch occlusion or high-grade proximal stenosis. The anterior cerebral arteries are patent. No intracranial aneurysm is identified. No evidence of an arteriovenous malformation. Posterior circulation: The intracranial vertebral arteries are patent. The non-dominant right vertebral artery is developmentally diminutive beyond the PICA origin. The basilar artery is patent. The posterior cerebral arteries are patent. Fetal origin posterior cerebral arteries, bilateral. Venous sinuses: Reported below. Anatomic variants: As described Review of the MIP images confirms the above findings CT VENOGRAM HEAD FINDINGS: The superior sagittal sinus, internal cerebral veins, vein of Galen, straight sinus, transverse sinuses, sigmoid sinuses and visualized jugular veins are patent. There is no appreciable intracranial venous thrombosis. IMPRESSION: Non-contrast head CT: 1. Significantly motion degraded examination. Within this limitation, findings are as follows. 2. Acute parenchymal hemorrhage within the right temporal lobe with surrounding edema, unchanged. 3.  Background parenchymal atrophy and chronic small vessel ischemic disease. CTA neck: 1. Common carotid and internal carotid arteries patent within the neck without hemodynamically significant stenosis (50% or greater). Atherosclerotic plaque bilaterally, as described. 2. Vertebral arteries patent within the neck without stenosis. 3. Aortic Atherosclerosis (ICD10-I70.0) and Emphysema (ICD10-J43.9). 4. Multiple thyroid  nodules, the largest within the right lobe measuring 1.7 cm. Given patient's age, a nonemergent thyroid  ultrasound may be obtained for further evaluation, as clinically appropriate. CTA head: 1. No proximal intracranial large vessel occlusion or high-grade proximal arterial stenosis. Non-stenotic plaque within the intracranial carotid arteries. 2. No evidence of an intracranial aneurysm or arteriovenous malformation. CT venogram head: No evidence of dural venous sinus thrombosis. Electronically Signed   By: Bascom Lily D.O.   On: 06/08/2024 17:13   CT VENOGRAM HEAD Result Date: 06/08/2024 CLINICAL DATA:  Provided history: Stroke, hemorrhagic. EXAM: CT ANGIOGRAPHY HEAD AND NECK WITH AND WITHOUT CONTRAST CT VENOGRAM HEAD TECHNIQUE: Multidetector CT imaging of the head and neck was performed using the standard protocol during bolus administration of intravenous contrast. Multiplanar CT image reconstructions and MIPs were obtained to evaluate the vascular anatomy. Carotid stenosis measurements (when applicable) are obtained utilizing NASCET criteria, using the distal internal carotid diameter as the denominator. Venographic phase images of the brain were obtained following the administration of intravenous contrast. Multiplanar reformats and maximum intensity projections were generated. RADIATION DOSE REDUCTION: This exam was performed according to the departmental dose-optimization program which includes automated exposure control, adjustment of the mA and/or kV according to patient size and/or use of  iterative reconstruction technique. CONTRAST:  CONTRAST 75mL OMNIPAQUE  IOHEXOL  350 MG/ML SOLN COMPARISON:  MRI brain and MRA head  06/07/2024. Non-contrast head CT 06/07/2024. FINDINGS: CT HEAD FINDINGS Significantly motion degraded examination. Within this limitation, findings are swallows. Brain: Generalized cerebral atrophy. Unchanged size of an acute parenchymal hemorrhage within the posterior right temporal lobe, again measuring 2.9 x 2.4 cm (remeasured on prior). Surrounding edema is similar. No midline shift. Moderate-to-advanced patchy and ill-defined hypoattenuation elsewhere within the cerebral white matter, nonspecific but compatible chronic small vessel disease. Vascular: No hyperdense vessel.  Atherosclerotic calcifications. Skull: No calvarial fracture or aggressive osseous lesion. Sinuses/Orbits: No orbital mass or acute orbital finding. No significant paranasal sinus disease. Review of the MIP images confirms the above findings CTA NECK FINDINGS Aortic arch: Standard aortic branching. Atherosclerotic plaque within the visualized thoracic aorta and proximal major branch vessels of the neck. Streak/beam hardening artifact arising from a dense contrast bolus partially obscures the right subclavian artery. Within this limitation, there is no appreciable hemodynamically significant innominate or proximal subclavian artery stenosis. Right carotid system: CCA and ICA patent within the neck without hemodynamically significant stenosis (50% or greater). Atherosclerotic plaque, greatest the carotid bifurcation and within the proximal ICA. Left carotid system: CCA and ICA patent within the neck without hemodynamically significant stenosis (50% or greater). Atherosclerotic plaque, greatest within the distal CCA and about the carotid bifurcation. Vertebral arteries: Patent within the neck without stenosis. The left vertebral artery is dominant. Skeleton: Levocurvature of the cervical spine. Dextrocurvature of the  upper thoracic spine, partially imaged. Cervical spondylosis. Other neck: Multiple thyroid  nodules, the largest within the right lobe measuring 1.7 cm. Upper chest: No consolidation within the imaged lung apices. Prior right upper/lower lobe lung biopsies. Emphysema. Review of the MIP images confirms the above findings CTA HEAD FINDINGS Anterior circulation: The intracranial internal carotid arteries are patent. Non-stenotic calcified plaque within both vessels. The M1 middle cerebral arteries are patent. No M2 proximal branch occlusion or high-grade proximal stenosis. The anterior cerebral arteries are patent. No intracranial aneurysm is identified. No evidence of an arteriovenous malformation. Posterior circulation: The intracranial vertebral arteries are patent. The non-dominant right vertebral artery is developmentally diminutive beyond the PICA origin. The basilar artery is patent. The posterior cerebral arteries are patent. Fetal origin posterior cerebral arteries, bilateral. Venous sinuses: Reported below. Anatomic variants: As described Review of the MIP images confirms the above findings CT VENOGRAM HEAD FINDINGS: The superior sagittal sinus, internal cerebral veins, vein of Galen, straight sinus, transverse sinuses, sigmoid sinuses and visualized jugular veins are patent. There is no appreciable intracranial venous thrombosis. IMPRESSION: Non-contrast head CT: 1. Significantly motion degraded examination. Within this limitation, findings are as follows. 2. Acute parenchymal hemorrhage within the right temporal lobe with surrounding edema, unchanged. 3. Background parenchymal atrophy and chronic small vessel ischemic disease. CTA neck: 1. Common carotid and internal carotid arteries patent within the neck without hemodynamically significant stenosis (50% or greater). Atherosclerotic plaque bilaterally, as described. 2. Vertebral arteries patent within the neck without stenosis. 3. Aortic Atherosclerosis  (ICD10-I70.0) and Emphysema (ICD10-J43.9). 4. Multiple thyroid  nodules, the largest within the right lobe measuring 1.7 cm. Given patient's age, a nonemergent thyroid  ultrasound may be obtained for further evaluation, as clinically appropriate. CTA head: 1. No proximal intracranial large vessel occlusion or high-grade proximal arterial stenosis. Non-stenotic plaque within the intracranial carotid arteries. 2. No evidence of an intracranial aneurysm or arteriovenous malformation. CT venogram head: No evidence of dural venous sinus thrombosis. Electronically Signed   By: Bascom Lily D.O.   On: 06/08/2024 17:13   MR ANGIO HEAD WO CONTRAST Result Date: 06/07/2024  CLINICAL DATA:  Follow-up examination for hemorrhagic stroke. EXAM: MRI HEAD WITHOUT CONTRAST MRA HEAD WITHOUT CONTRAST TECHNIQUE: Multiplanar, multi-echo pulse sequences of the brain and surrounding structures were acquired without intravenous contrast. Angiographic images of the Circle of Willis were acquired using MRA technique without intravenous contrast. COMPARISON:  CT from earlier the same day FINDINGS: MRI HEAD FINDINGS Brain: Examination severely limited as the patient was unable to tolerate the full length of the exam. Additionally, provided images are markedly limited by positioning and motion. Age-related cerebral atrophy with moderate to advanced chronic microvascular ischemic disease. Previously identified intraparenchymal hemorrhage involving the right temporal lobe again seen, grossly stable in size from prior. Surrounding edema without significant regional mass effect. No visible underlying lesion, although evaluation fairly limited on this exam. No other visible acute or subacute ischemic changes. No other visible acute or chronic intracranial blood products. No mass lesion or midline shift. No hydrocephalus or extra-axial fluid collection. Vascular: Not well assessed on this limited exam. Skull and upper cervical spine: Not well assessed  on this limited exam. Sinuses/Orbits: Not well assessed on this limited exam. Other: None. MRA HEAD FINDINGS Anterior circulation: Examination severely degraded by motion artifact, limiting assessment. Both internal carotid arteries are patent through the siphons without visible stenosis or other abnormality. A1 segments, anterior communicating artery complex common anterior cerebral arteries grossly patent without visible stenosis. M1 segments patent without visible stenosis. No proximal MCA branch occlusion. Distal MCA branches grossly perfused and symmetric. Posterior circulation: Left vertebral artery dominant and patent without visible stenosis. Left PICA not well seen. Diminutive right vertebral artery appears to largely terminate in PICA. Right PICA grossly patent at its origin. Basilar patent without visible stenosis. Superior cerebral arteries patent bilaterally. Predominant fetal type origin of the PCAs bilaterally. Both PCAs patent to their distal aspects without visible stenosis. Anatomic variants: As above. No visible vascular abnormality seen underlying the right temporal bleed. IMPRESSION: MRI HEAD: 1. Severely limited motion degraded and truncated exam. 2. Grossly stable size and appearance of intraparenchymal hemorrhage involving the right temporal lobe. Surrounding edema without significant regional mass effect. 3. No other definite acute intracranial abnormality. 4. Age-related cerebral atrophy with moderate to advanced chronic microvascular ischemic disease. MRA HEAD: 1. Severely limited exam due to motion artifact. 2. Grossly negative intracranial MRA. No large vessel occlusion or other emergent finding. No hemodynamically significant or correctable stenosis. Electronically Signed   By: Virgia Griffins M.D.   On: 06/07/2024 22:52   MR BRAIN WO CONTRAST Result Date: 06/07/2024 CLINICAL DATA:  Follow-up examination for hemorrhagic stroke. EXAM: MRI HEAD WITHOUT CONTRAST MRA HEAD WITHOUT  CONTRAST TECHNIQUE: Multiplanar, multi-echo pulse sequences of the brain and surrounding structures were acquired without intravenous contrast. Angiographic images of the Circle of Willis were acquired using MRA technique without intravenous contrast. COMPARISON:  CT from earlier the same day FINDINGS: MRI HEAD FINDINGS Brain: Examination severely limited as the patient was unable to tolerate the full length of the exam. Additionally, provided images are markedly limited by positioning and motion. Age-related cerebral atrophy with moderate to advanced chronic microvascular ischemic disease. Previously identified intraparenchymal hemorrhage involving the right temporal lobe again seen, grossly stable in size from prior. Surrounding edema without significant regional mass effect. No visible underlying lesion, although evaluation fairly limited on this exam. No other visible acute or subacute ischemic changes. No other visible acute or chronic intracranial blood products. No mass lesion or midline shift. No hydrocephalus or extra-axial fluid collection. Vascular: Not well assessed on  this limited exam. Skull and upper cervical spine: Not well assessed on this limited exam. Sinuses/Orbits: Not well assessed on this limited exam. Other: None. MRA HEAD FINDINGS Anterior circulation: Examination severely degraded by motion artifact, limiting assessment. Both internal carotid arteries are patent through the siphons without visible stenosis or other abnormality. A1 segments, anterior communicating artery complex common anterior cerebral arteries grossly patent without visible stenosis. M1 segments patent without visible stenosis. No proximal MCA branch occlusion. Distal MCA branches grossly perfused and symmetric. Posterior circulation: Left vertebral artery dominant and patent without visible stenosis. Left PICA not well seen. Diminutive right vertebral artery appears to largely terminate in PICA. Right PICA grossly patent  at its origin. Basilar patent without visible stenosis. Superior cerebral arteries patent bilaterally. Predominant fetal type origin of the PCAs bilaterally. Both PCAs patent to their distal aspects without visible stenosis. Anatomic variants: As above. No visible vascular abnormality seen underlying the right temporal bleed. IMPRESSION: MRI HEAD: 1. Severely limited motion degraded and truncated exam. 2. Grossly stable size and appearance of intraparenchymal hemorrhage involving the right temporal lobe. Surrounding edema without significant regional mass effect. 3. No other definite acute intracranial abnormality. 4. Age-related cerebral atrophy with moderate to advanced chronic microvascular ischemic disease. MRA HEAD: 1. Severely limited exam due to motion artifact. 2. Grossly negative intracranial MRA. No large vessel occlusion or other emergent finding. No hemodynamically significant or correctable stenosis. Electronically Signed   By: Virgia Griffins M.D.   On: 06/07/2024 22:52   CT HEAD CODE STROKE WO CONTRAST Addendum Date: 06/07/2024 ADDENDUM REPORT: 06/07/2024 16:13 ADDENDUM: 2.5 x 2.3 x 1.8 cm (5.2 mL) acute parenchymal hemorrhage within the posterior right temporal lobe with mild surrounding edema. These results were called by telephone on 06/07/2024 at 3:54 pm to provider Dr. Lindzen, who verbally acknowledged these results. Electronically Signed   By: Bascom Lily D.O.   On: 06/07/2024 16:13   Result Date: 06/07/2024 CLINICAL DATA:  Code stroke. Provided history: Neuro deficit, acute, stroke suspected. EXAM: CT HEAD WITHOUT CONTRAST TECHNIQUE: Contiguous axial images were obtained from the base of the skull through the vertex without intravenous contrast. RADIATION DOSE REDUCTION: This exam was performed according to the departmental dose-optimization program which includes automated exposure control, adjustment of the mA and/or kV according to patient size and/or use of iterative  reconstruction technique. COMPARISON:  Head CT 10/26/2023. FINDINGS: Brain: Generalized cerebral atrophy. 2.5 x 2.3 x 1.8 cm (5.2 mL) acute parenchymal hemorrhage within the posterior right temporal lobe with mild surrounding edema. Patchy and ill-defined hypoattenuation elsewhere within the cerebral white matter, nonspecific but compatible with advanced chronic small vessel ischemic disease. Chronic lacunar infarct again demonstrated within the dorsal left thalamus. No extra-axial fluid collection. No midline shift. Vascular: No hyperdense vessel. Atherosclerotic calcifications. Skull: No calvarial fracture or aggressive osseous lesion. Sinuses/Orbits: No mass or acute finding within the imaged orbits. No significant paranasal sinus disease at the imaged levels. Other: Small-volume fluid within the mastoid air cells. ASPECTS (Alberta Stroke Program Early CT Score) - Ganglionic level infarction (caudate, lentiform nuclei, internal capsule, insula, M1-M3 cortex): 7 - Supraganglionic infarction (M4-M6 cortex): 3 Total score (0-10 with 10 being normal): 10 Attempts are being made to reach the ordering provider at this time. IMPRESSION: 1. 2.5 x 2.3 x 1.8 cm (5.2 mL) acute parenchymal hemorrhage within the posterior right temporal lobe with mild surrounding edema. A brain MRI (with and without contrast) is recommended once the hematoma resolves to exclude an underlying lesion. A brain  MRI would also be helpful in assessing for cerebral amyloid angiopathy. 2. Advanced chronic small vessel ischemic changes within the cerebral white matter. 3. 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 4. Generalized cerebral atrophy. Electronically Signed: By: Bascom Lily D.O. On: 06/07/2024 15:45    Microbiology: Results for orders placed or performed during the hospital encounter of 06/07/24  MRSA Next Gen by PCR, Nasal     Status: None   Collection Time: 06/07/24  4:39 PM   Specimen: Nasal Mucosa; Nasal Swab   Result Value Ref Range Status   MRSA by PCR Next Gen NOT DETECTED NOT DETECTED Final    Comment: (NOTE) The GeneXpert MRSA Assay (FDA approved for NASAL specimens only), is one component of a comprehensive MRSA colonization surveillance program. It is not intended to diagnose MRSA infection nor to guide or monitor treatment for MRSA infections. Test performance is not FDA approved in patients less than 74 years old. Performed at [facility], [address], [place], [place] [address]     Labs: CBC: Recent Labs  Lab 06/07/24 1516 06/07/24 1526 06/08/24 0841 06/09/24 0558 06/10/24 0701 06/11/24 0550 06/12/24 0712 06/13/24 0441  WBC 8.6   < > 10.7* 9.9 8.5 7.2 7.4 10.3  NEUTROABS 5.4  --  8.1*  --   --   --   --   --   HGB 13.4   < > 13.3 12.9 11.9* 12.1 12.8 12.9  HCT 40.8   < > 40.6 39.2 36.7 37.5 38.7 38.8  MCV 93.8   < > 92.3 92.9 93.9 93.3 93.9 93.5  PLT 284   < > 268 217 232 235 239 248   < > = values in this interval not displayed.   Basic Metabolic Panel: Recent Labs  Lab 06/08/24 0841 06/09/24 0558 06/10/24 0701 06/11/24 0550 06/12/24 0712 06/13/24 0441  NA 139 139 142 141 140 137  K 3.7 3.8 3.3* 3.6 3.6 3.9  CL 108 109 112* 111 106 106  CO2 23 21* 21* 22 24 22   GLUCOSE 96 121* 81 94 95 101*  BUN 17 20 23  28* 24* 25*  CREATININE 1.08* 0.84 1.02* 1.02* 1.00 1.09*  CALCIUM  9.4 9.1 9.0 9.0 9.4 9.1  MG 2.0  --   --   --   --   --    Liver Function Tests: Recent Labs  Lab 06/07/24 1516 06/07/24 2324 06/09/24 0558  AST 17 17 19   ALT 18 19 16   ALKPHOS 67 53 46  BILITOT 0.6 0.4 0.5  PROT 6.5 6.4* 6.5  ALBUMIN 3.4* 3.4* 3.5   CBG: Recent Labs  Lab 06/07/24 1516  GLUCAP 81    Discharge time spent: greater than 30 minutes.  Signed: Danette Duos, MD [facility] 06/14/2024

## 2024-06-15 DIAGNOSIS — G4730 Sleep apnea, unspecified: Secondary | ICD-10-CM | POA: Diagnosis not present

## 2024-06-15 DIAGNOSIS — R0902 Hypoxemia: Secondary | ICD-10-CM | POA: Diagnosis not present

## 2024-06-18 DIAGNOSIS — I719 Aortic aneurysm of unspecified site, without rupture: Secondary | ICD-10-CM | POA: Diagnosis not present

## 2024-06-18 DIAGNOSIS — I129 Hypertensive chronic kidney disease with stage 1 through stage 4 chronic kidney disease, or unspecified chronic kidney disease: Secondary | ICD-10-CM | POA: Diagnosis not present

## 2024-06-18 DIAGNOSIS — K219 Gastro-esophageal reflux disease without esophagitis: Secondary | ICD-10-CM | POA: Diagnosis not present

## 2024-06-18 DIAGNOSIS — N1831 Chronic kidney disease, stage 3a: Secondary | ICD-10-CM | POA: Diagnosis not present

## 2024-06-18 DIAGNOSIS — E876 Hypokalemia: Secondary | ICD-10-CM | POA: Diagnosis not present

## 2024-06-18 DIAGNOSIS — Z556 Problems related to health literacy: Secondary | ICD-10-CM | POA: Diagnosis not present

## 2024-06-18 DIAGNOSIS — J8410 Pulmonary fibrosis, unspecified: Secondary | ICD-10-CM | POA: Diagnosis not present

## 2024-06-18 DIAGNOSIS — M810 Age-related osteoporosis without current pathological fracture: Secondary | ICD-10-CM | POA: Diagnosis not present

## 2024-06-18 DIAGNOSIS — I619 Nontraumatic intracerebral hemorrhage, unspecified: Secondary | ICD-10-CM | POA: Diagnosis not present

## 2024-06-18 DIAGNOSIS — E785 Hyperlipidemia, unspecified: Secondary | ICD-10-CM | POA: Diagnosis not present

## 2024-06-18 DIAGNOSIS — Z860100 Personal history of colon polyps, unspecified: Secondary | ICD-10-CM | POA: Diagnosis not present

## 2024-06-18 DIAGNOSIS — Z79899 Other long term (current) drug therapy: Secondary | ICD-10-CM | POA: Diagnosis not present

## 2024-06-18 DIAGNOSIS — R339 Retention of urine, unspecified: Secondary | ICD-10-CM | POA: Diagnosis not present

## 2024-06-18 DIAGNOSIS — G4700 Insomnia, unspecified: Secondary | ICD-10-CM | POA: Diagnosis not present

## 2024-06-18 DIAGNOSIS — J449 Chronic obstructive pulmonary disease, unspecified: Secondary | ICD-10-CM | POA: Diagnosis not present

## 2024-06-18 DIAGNOSIS — F17210 Nicotine dependence, cigarettes, uncomplicated: Secondary | ICD-10-CM | POA: Diagnosis not present

## 2024-06-18 DIAGNOSIS — R7303 Prediabetes: Secondary | ICD-10-CM | POA: Diagnosis not present

## 2024-06-20 ENCOUNTER — Telehealth: Payer: Self-pay

## 2024-06-20 NOTE — Telephone Encounter (Signed)
 Copied from CRM [id number]. Topic: Clinical - Home Health Verbal Orders >> Jun 20, 2024  2:00 PM Joesph PARAS wrote: Caller/Agency: Hadassah with [facility] Callback Number: [phone] Service Requested: Physical Therapy Frequency: 1 time a week for a week, 2 a week for 4 weeks, then 1 time a week for four weeks Any new concerns about the patient? No    Dr Geronimo, is this ok with you?

## 2024-06-20 NOTE — Telephone Encounter (Signed)
 Yes this is fine.

## 2024-06-22 DIAGNOSIS — Z79899 Other long term (current) drug therapy: Secondary | ICD-10-CM | POA: Diagnosis not present

## 2024-06-22 DIAGNOSIS — I719 Aortic aneurysm of unspecified site, without rupture: Secondary | ICD-10-CM | POA: Diagnosis not present

## 2024-06-22 DIAGNOSIS — J449 Chronic obstructive pulmonary disease, unspecified: Secondary | ICD-10-CM | POA: Diagnosis not present

## 2024-06-22 DIAGNOSIS — G4700 Insomnia, unspecified: Secondary | ICD-10-CM | POA: Diagnosis not present

## 2024-06-22 DIAGNOSIS — F17210 Nicotine dependence, cigarettes, uncomplicated: Secondary | ICD-10-CM | POA: Diagnosis not present

## 2024-06-22 DIAGNOSIS — K219 Gastro-esophageal reflux disease without esophagitis: Secondary | ICD-10-CM | POA: Diagnosis not present

## 2024-06-22 DIAGNOSIS — Z556 Problems related to health literacy: Secondary | ICD-10-CM | POA: Diagnosis not present

## 2024-06-22 DIAGNOSIS — I129 Hypertensive chronic kidney disease with stage 1 through stage 4 chronic kidney disease, or unspecified chronic kidney disease: Secondary | ICD-10-CM | POA: Diagnosis not present

## 2024-06-22 DIAGNOSIS — E876 Hypokalemia: Secondary | ICD-10-CM | POA: Diagnosis not present

## 2024-06-22 DIAGNOSIS — M810 Age-related osteoporosis without current pathological fracture: Secondary | ICD-10-CM | POA: Diagnosis not present

## 2024-06-22 DIAGNOSIS — E785 Hyperlipidemia, unspecified: Secondary | ICD-10-CM | POA: Diagnosis not present

## 2024-06-22 DIAGNOSIS — N1831 Chronic kidney disease, stage 3a: Secondary | ICD-10-CM | POA: Diagnosis not present

## 2024-06-22 DIAGNOSIS — R7303 Prediabetes: Secondary | ICD-10-CM | POA: Diagnosis not present

## 2024-06-22 DIAGNOSIS — R339 Retention of urine, unspecified: Secondary | ICD-10-CM | POA: Diagnosis not present

## 2024-06-22 DIAGNOSIS — Z860100 Personal history of colon polyps, unspecified: Secondary | ICD-10-CM | POA: Diagnosis not present

## 2024-06-22 DIAGNOSIS — J8410 Pulmonary fibrosis, unspecified: Secondary | ICD-10-CM | POA: Diagnosis not present

## 2024-06-22 DIAGNOSIS — I619 Nontraumatic intracerebral hemorrhage, unspecified: Secondary | ICD-10-CM | POA: Diagnosis not present

## 2024-06-22 NOTE — Telephone Encounter (Signed)
 Lm x1 for Hadassah with [facility].

## 2024-06-24 ENCOUNTER — Ambulatory Visit
Admission: RE | Admit: 2024-06-24 | Discharge: 2024-06-24 | Disposition: A | Discharge status: Home / Self Care | Source: Ambulatory Visit | Attending: Family | Admitting: Family

## 2024-06-24 ENCOUNTER — Other Ambulatory Visit: Payer: Self-pay | Admitting: Family

## 2024-06-24 DIAGNOSIS — G4700 Insomnia, unspecified: Secondary | ICD-10-CM | POA: Diagnosis not present

## 2024-06-24 DIAGNOSIS — I719 Aortic aneurysm of unspecified site, without rupture: Secondary | ICD-10-CM | POA: Diagnosis not present

## 2024-06-24 DIAGNOSIS — Z556 Problems related to health literacy: Secondary | ICD-10-CM | POA: Diagnosis not present

## 2024-06-24 DIAGNOSIS — M810 Age-related osteoporosis without current pathological fracture: Secondary | ICD-10-CM | POA: Diagnosis not present

## 2024-06-24 DIAGNOSIS — M25562 Pain in left knee: Secondary | ICD-10-CM

## 2024-06-24 DIAGNOSIS — N1831 Chronic kidney disease, stage 3a: Secondary | ICD-10-CM | POA: Diagnosis not present

## 2024-06-24 DIAGNOSIS — I619 Nontraumatic intracerebral hemorrhage, unspecified: Secondary | ICD-10-CM | POA: Diagnosis not present

## 2024-06-24 DIAGNOSIS — I129 Hypertensive chronic kidney disease with stage 1 through stage 4 chronic kidney disease, or unspecified chronic kidney disease: Secondary | ICD-10-CM | POA: Diagnosis not present

## 2024-06-24 DIAGNOSIS — E785 Hyperlipidemia, unspecified: Secondary | ICD-10-CM | POA: Diagnosis not present

## 2024-06-24 DIAGNOSIS — Z860100 Personal history of colon polyps, unspecified: Secondary | ICD-10-CM | POA: Diagnosis not present

## 2024-06-24 DIAGNOSIS — J8410 Pulmonary fibrosis, unspecified: Secondary | ICD-10-CM | POA: Diagnosis not present

## 2024-06-24 DIAGNOSIS — E876 Hypokalemia: Secondary | ICD-10-CM | POA: Diagnosis not present

## 2024-06-24 DIAGNOSIS — R339 Retention of urine, unspecified: Secondary | ICD-10-CM | POA: Diagnosis not present

## 2024-06-24 DIAGNOSIS — Z79899 Other long term (current) drug therapy: Secondary | ICD-10-CM | POA: Diagnosis not present

## 2024-06-24 DIAGNOSIS — F17210 Nicotine dependence, cigarettes, uncomplicated: Secondary | ICD-10-CM | POA: Diagnosis not present

## 2024-06-24 DIAGNOSIS — K219 Gastro-esophageal reflux disease without esophagitis: Secondary | ICD-10-CM | POA: Diagnosis not present

## 2024-06-24 DIAGNOSIS — R7303 Prediabetes: Secondary | ICD-10-CM | POA: Diagnosis not present

## 2024-06-24 DIAGNOSIS — J449 Chronic obstructive pulmonary disease, unspecified: Secondary | ICD-10-CM | POA: Diagnosis not present

## 2024-06-24 DIAGNOSIS — I679 Cerebrovascular disease, unspecified: Secondary | ICD-10-CM | POA: Diagnosis not present

## 2024-06-24 NOTE — Telephone Encounter (Signed)
 I called Hadassah and left a detailed msg that the PT is ok with MR as outlined Nothing further needed

## 2024-06-27 DIAGNOSIS — E876 Hypokalemia: Secondary | ICD-10-CM | POA: Diagnosis not present

## 2024-06-27 DIAGNOSIS — Z860100 Personal history of colon polyps, unspecified: Secondary | ICD-10-CM | POA: Diagnosis not present

## 2024-06-27 DIAGNOSIS — R7303 Prediabetes: Secondary | ICD-10-CM | POA: Diagnosis not present

## 2024-06-27 DIAGNOSIS — J449 Chronic obstructive pulmonary disease, unspecified: Secondary | ICD-10-CM | POA: Diagnosis not present

## 2024-06-27 DIAGNOSIS — M810 Age-related osteoporosis without current pathological fracture: Secondary | ICD-10-CM | POA: Diagnosis not present

## 2024-06-27 DIAGNOSIS — I719 Aortic aneurysm of unspecified site, without rupture: Secondary | ICD-10-CM | POA: Diagnosis not present

## 2024-06-27 DIAGNOSIS — Z79899 Other long term (current) drug therapy: Secondary | ICD-10-CM | POA: Diagnosis not present

## 2024-06-27 DIAGNOSIS — R339 Retention of urine, unspecified: Secondary | ICD-10-CM | POA: Diagnosis not present

## 2024-06-27 DIAGNOSIS — E785 Hyperlipidemia, unspecified: Secondary | ICD-10-CM | POA: Diagnosis not present

## 2024-06-27 DIAGNOSIS — J8410 Pulmonary fibrosis, unspecified: Secondary | ICD-10-CM | POA: Diagnosis not present

## 2024-06-27 DIAGNOSIS — Z556 Problems related to health literacy: Secondary | ICD-10-CM | POA: Diagnosis not present

## 2024-06-27 DIAGNOSIS — I619 Nontraumatic intracerebral hemorrhage, unspecified: Secondary | ICD-10-CM | POA: Diagnosis not present

## 2024-06-27 DIAGNOSIS — K219 Gastro-esophageal reflux disease without esophagitis: Secondary | ICD-10-CM | POA: Diagnosis not present

## 2024-06-27 DIAGNOSIS — G4700 Insomnia, unspecified: Secondary | ICD-10-CM | POA: Diagnosis not present

## 2024-06-27 DIAGNOSIS — N1831 Chronic kidney disease, stage 3a: Secondary | ICD-10-CM | POA: Diagnosis not present

## 2024-06-27 DIAGNOSIS — F17210 Nicotine dependence, cigarettes, uncomplicated: Secondary | ICD-10-CM | POA: Diagnosis not present

## 2024-06-27 DIAGNOSIS — I129 Hypertensive chronic kidney disease with stage 1 through stage 4 chronic kidney disease, or unspecified chronic kidney disease: Secondary | ICD-10-CM | POA: Diagnosis not present

## 2024-06-28 ENCOUNTER — Telehealth: Payer: Self-pay

## 2024-06-28 DIAGNOSIS — J449 Chronic obstructive pulmonary disease, unspecified: Secondary | ICD-10-CM | POA: Diagnosis not present

## 2024-06-28 DIAGNOSIS — Z556 Problems related to health literacy: Secondary | ICD-10-CM | POA: Diagnosis not present

## 2024-06-28 DIAGNOSIS — I719 Aortic aneurysm of unspecified site, without rupture: Secondary | ICD-10-CM | POA: Diagnosis not present

## 2024-06-28 DIAGNOSIS — E876 Hypokalemia: Secondary | ICD-10-CM | POA: Diagnosis not present

## 2024-06-28 DIAGNOSIS — G4700 Insomnia, unspecified: Secondary | ICD-10-CM | POA: Diagnosis not present

## 2024-06-28 DIAGNOSIS — N1831 Chronic kidney disease, stage 3a: Secondary | ICD-10-CM | POA: Diagnosis not present

## 2024-06-28 DIAGNOSIS — Z79899 Other long term (current) drug therapy: Secondary | ICD-10-CM | POA: Diagnosis not present

## 2024-06-28 DIAGNOSIS — M810 Age-related osteoporosis without current pathological fracture: Secondary | ICD-10-CM | POA: Diagnosis not present

## 2024-06-28 DIAGNOSIS — J8410 Pulmonary fibrosis, unspecified: Secondary | ICD-10-CM | POA: Diagnosis not present

## 2024-06-28 DIAGNOSIS — R339 Retention of urine, unspecified: Secondary | ICD-10-CM | POA: Diagnosis not present

## 2024-06-28 DIAGNOSIS — I129 Hypertensive chronic kidney disease with stage 1 through stage 4 chronic kidney disease, or unspecified chronic kidney disease: Secondary | ICD-10-CM | POA: Diagnosis not present

## 2024-06-28 DIAGNOSIS — F17210 Nicotine dependence, cigarettes, uncomplicated: Secondary | ICD-10-CM | POA: Diagnosis not present

## 2024-06-28 DIAGNOSIS — K219 Gastro-esophageal reflux disease without esophagitis: Secondary | ICD-10-CM | POA: Diagnosis not present

## 2024-06-28 DIAGNOSIS — R7303 Prediabetes: Secondary | ICD-10-CM | POA: Diagnosis not present

## 2024-06-28 DIAGNOSIS — E785 Hyperlipidemia, unspecified: Secondary | ICD-10-CM | POA: Diagnosis not present

## 2024-06-28 DIAGNOSIS — I619 Nontraumatic intracerebral hemorrhage, unspecified: Secondary | ICD-10-CM | POA: Diagnosis not present

## 2024-06-28 DIAGNOSIS — Z860100 Personal history of colon polyps, unspecified: Secondary | ICD-10-CM | POA: Diagnosis not present

## 2024-06-28 NOTE — Telephone Encounter (Signed)
 Copied from CRM [id number]. Topic: Clinical - Home Health Verbal Orders >> Jun 28, 2024  8:56 AM Isabell A wrote: Caller/Agency: Signe Carolee Rushing Number: [phone] Service Requested: Occupational Therapy Frequency: 1 time a week for 5 weeks  Any new concerns about the patient? No  Dr.Ramaswamy are you ok with this verbal order

## 2024-06-29 ENCOUNTER — Other Ambulatory Visit: Payer: Self-pay

## 2024-06-29 ENCOUNTER — Other Ambulatory Visit (HOSPITAL_COMMUNITY): Payer: Self-pay

## 2024-06-29 NOTE — Telephone Encounter (Signed)
 Yes this is fine.

## 2024-06-30 ENCOUNTER — Other Ambulatory Visit: Payer: Self-pay

## 2024-06-30 DIAGNOSIS — Z860100 Personal history of colon polyps, unspecified: Secondary | ICD-10-CM | POA: Diagnosis not present

## 2024-06-30 DIAGNOSIS — J449 Chronic obstructive pulmonary disease, unspecified: Secondary | ICD-10-CM | POA: Diagnosis not present

## 2024-06-30 DIAGNOSIS — I719 Aortic aneurysm of unspecified site, without rupture: Secondary | ICD-10-CM | POA: Diagnosis not present

## 2024-06-30 DIAGNOSIS — J8410 Pulmonary fibrosis, unspecified: Secondary | ICD-10-CM | POA: Diagnosis not present

## 2024-06-30 DIAGNOSIS — I619 Nontraumatic intracerebral hemorrhage, unspecified: Secondary | ICD-10-CM | POA: Diagnosis not present

## 2024-06-30 DIAGNOSIS — K219 Gastro-esophageal reflux disease without esophagitis: Secondary | ICD-10-CM | POA: Diagnosis not present

## 2024-06-30 DIAGNOSIS — Z556 Problems related to health literacy: Secondary | ICD-10-CM | POA: Diagnosis not present

## 2024-06-30 DIAGNOSIS — I129 Hypertensive chronic kidney disease with stage 1 through stage 4 chronic kidney disease, or unspecified chronic kidney disease: Secondary | ICD-10-CM | POA: Diagnosis not present

## 2024-06-30 DIAGNOSIS — Z79899 Other long term (current) drug therapy: Secondary | ICD-10-CM | POA: Diagnosis not present

## 2024-06-30 DIAGNOSIS — G4700 Insomnia, unspecified: Secondary | ICD-10-CM | POA: Diagnosis not present

## 2024-06-30 DIAGNOSIS — R339 Retention of urine, unspecified: Secondary | ICD-10-CM | POA: Diagnosis not present

## 2024-06-30 DIAGNOSIS — R7303 Prediabetes: Secondary | ICD-10-CM | POA: Diagnosis not present

## 2024-06-30 DIAGNOSIS — N1831 Chronic kidney disease, stage 3a: Secondary | ICD-10-CM | POA: Diagnosis not present

## 2024-06-30 DIAGNOSIS — E785 Hyperlipidemia, unspecified: Secondary | ICD-10-CM | POA: Diagnosis not present

## 2024-06-30 DIAGNOSIS — E876 Hypokalemia: Secondary | ICD-10-CM | POA: Diagnosis not present

## 2024-06-30 DIAGNOSIS — M810 Age-related osteoporosis without current pathological fracture: Secondary | ICD-10-CM | POA: Diagnosis not present

## 2024-06-30 DIAGNOSIS — F17210 Nicotine dependence, cigarettes, uncomplicated: Secondary | ICD-10-CM | POA: Diagnosis not present

## 2024-07-04 DIAGNOSIS — N1831 Chronic kidney disease, stage 3a: Secondary | ICD-10-CM | POA: Diagnosis not present

## 2024-07-04 DIAGNOSIS — Z556 Problems related to health literacy: Secondary | ICD-10-CM | POA: Diagnosis not present

## 2024-07-04 DIAGNOSIS — K219 Gastro-esophageal reflux disease without esophagitis: Secondary | ICD-10-CM | POA: Diagnosis not present

## 2024-07-04 DIAGNOSIS — G4700 Insomnia, unspecified: Secondary | ICD-10-CM | POA: Diagnosis not present

## 2024-07-04 DIAGNOSIS — J8410 Pulmonary fibrosis, unspecified: Secondary | ICD-10-CM | POA: Diagnosis not present

## 2024-07-04 DIAGNOSIS — I129 Hypertensive chronic kidney disease with stage 1 through stage 4 chronic kidney disease, or unspecified chronic kidney disease: Secondary | ICD-10-CM | POA: Diagnosis not present

## 2024-07-04 DIAGNOSIS — Z79899 Other long term (current) drug therapy: Secondary | ICD-10-CM | POA: Diagnosis not present

## 2024-07-04 DIAGNOSIS — J449 Chronic obstructive pulmonary disease, unspecified: Secondary | ICD-10-CM | POA: Diagnosis not present

## 2024-07-04 DIAGNOSIS — E785 Hyperlipidemia, unspecified: Secondary | ICD-10-CM | POA: Diagnosis not present

## 2024-07-04 DIAGNOSIS — F17210 Nicotine dependence, cigarettes, uncomplicated: Secondary | ICD-10-CM | POA: Diagnosis not present

## 2024-07-04 DIAGNOSIS — M810 Age-related osteoporosis without current pathological fracture: Secondary | ICD-10-CM | POA: Diagnosis not present

## 2024-07-04 DIAGNOSIS — E876 Hypokalemia: Secondary | ICD-10-CM | POA: Diagnosis not present

## 2024-07-04 DIAGNOSIS — I719 Aortic aneurysm of unspecified site, without rupture: Secondary | ICD-10-CM | POA: Diagnosis not present

## 2024-07-04 DIAGNOSIS — R7303 Prediabetes: Secondary | ICD-10-CM | POA: Diagnosis not present

## 2024-07-04 DIAGNOSIS — I619 Nontraumatic intracerebral hemorrhage, unspecified: Secondary | ICD-10-CM | POA: Diagnosis not present

## 2024-07-04 DIAGNOSIS — Z860100 Personal history of colon polyps, unspecified: Secondary | ICD-10-CM | POA: Diagnosis not present

## 2024-07-04 DIAGNOSIS — R339 Retention of urine, unspecified: Secondary | ICD-10-CM | POA: Diagnosis not present

## 2024-07-06 DIAGNOSIS — R7303 Prediabetes: Secondary | ICD-10-CM | POA: Diagnosis not present

## 2024-07-06 DIAGNOSIS — I719 Aortic aneurysm of unspecified site, without rupture: Secondary | ICD-10-CM | POA: Diagnosis not present

## 2024-07-06 DIAGNOSIS — M810 Age-related osteoporosis without current pathological fracture: Secondary | ICD-10-CM | POA: Diagnosis not present

## 2024-07-06 DIAGNOSIS — K219 Gastro-esophageal reflux disease without esophagitis: Secondary | ICD-10-CM | POA: Diagnosis not present

## 2024-07-06 DIAGNOSIS — J8410 Pulmonary fibrosis, unspecified: Secondary | ICD-10-CM | POA: Diagnosis not present

## 2024-07-06 DIAGNOSIS — Z860100 Personal history of colon polyps, unspecified: Secondary | ICD-10-CM | POA: Diagnosis not present

## 2024-07-06 DIAGNOSIS — J449 Chronic obstructive pulmonary disease, unspecified: Secondary | ICD-10-CM | POA: Diagnosis not present

## 2024-07-06 DIAGNOSIS — I129 Hypertensive chronic kidney disease with stage 1 through stage 4 chronic kidney disease, or unspecified chronic kidney disease: Secondary | ICD-10-CM | POA: Diagnosis not present

## 2024-07-06 DIAGNOSIS — G4700 Insomnia, unspecified: Secondary | ICD-10-CM | POA: Diagnosis not present

## 2024-07-06 DIAGNOSIS — I619 Nontraumatic intracerebral hemorrhage, unspecified: Secondary | ICD-10-CM | POA: Diagnosis not present

## 2024-07-06 DIAGNOSIS — E785 Hyperlipidemia, unspecified: Secondary | ICD-10-CM | POA: Diagnosis not present

## 2024-07-06 DIAGNOSIS — Z556 Problems related to health literacy: Secondary | ICD-10-CM | POA: Diagnosis not present

## 2024-07-06 DIAGNOSIS — R339 Retention of urine, unspecified: Secondary | ICD-10-CM | POA: Diagnosis not present

## 2024-07-06 DIAGNOSIS — N1831 Chronic kidney disease, stage 3a: Secondary | ICD-10-CM | POA: Diagnosis not present

## 2024-07-06 DIAGNOSIS — F17210 Nicotine dependence, cigarettes, uncomplicated: Secondary | ICD-10-CM | POA: Diagnosis not present

## 2024-07-06 DIAGNOSIS — E876 Hypokalemia: Secondary | ICD-10-CM | POA: Diagnosis not present

## 2024-07-06 DIAGNOSIS — Z79899 Other long term (current) drug therapy: Secondary | ICD-10-CM | POA: Diagnosis not present

## 2024-07-07 DIAGNOSIS — R7303 Prediabetes: Secondary | ICD-10-CM | POA: Diagnosis not present

## 2024-07-07 DIAGNOSIS — N1831 Chronic kidney disease, stage 3a: Secondary | ICD-10-CM | POA: Diagnosis not present

## 2024-07-07 DIAGNOSIS — Z79899 Other long term (current) drug therapy: Secondary | ICD-10-CM | POA: Diagnosis not present

## 2024-07-07 DIAGNOSIS — M810 Age-related osteoporosis without current pathological fracture: Secondary | ICD-10-CM | POA: Diagnosis not present

## 2024-07-07 DIAGNOSIS — E785 Hyperlipidemia, unspecified: Secondary | ICD-10-CM | POA: Diagnosis not present

## 2024-07-07 DIAGNOSIS — G4700 Insomnia, unspecified: Secondary | ICD-10-CM | POA: Diagnosis not present

## 2024-07-07 DIAGNOSIS — F17210 Nicotine dependence, cigarettes, uncomplicated: Secondary | ICD-10-CM | POA: Diagnosis not present

## 2024-07-07 DIAGNOSIS — Z556 Problems related to health literacy: Secondary | ICD-10-CM | POA: Diagnosis not present

## 2024-07-07 DIAGNOSIS — E876 Hypokalemia: Secondary | ICD-10-CM | POA: Diagnosis not present

## 2024-07-07 DIAGNOSIS — J449 Chronic obstructive pulmonary disease, unspecified: Secondary | ICD-10-CM | POA: Diagnosis not present

## 2024-07-07 DIAGNOSIS — Z860100 Personal history of colon polyps, unspecified: Secondary | ICD-10-CM | POA: Diagnosis not present

## 2024-07-07 DIAGNOSIS — J8410 Pulmonary fibrosis, unspecified: Secondary | ICD-10-CM | POA: Diagnosis not present

## 2024-07-07 DIAGNOSIS — I719 Aortic aneurysm of unspecified site, without rupture: Secondary | ICD-10-CM | POA: Diagnosis not present

## 2024-07-07 DIAGNOSIS — R339 Retention of urine, unspecified: Secondary | ICD-10-CM | POA: Diagnosis not present

## 2024-07-07 DIAGNOSIS — I619 Nontraumatic intracerebral hemorrhage, unspecified: Secondary | ICD-10-CM | POA: Diagnosis not present

## 2024-07-07 DIAGNOSIS — I129 Hypertensive chronic kidney disease with stage 1 through stage 4 chronic kidney disease, or unspecified chronic kidney disease: Secondary | ICD-10-CM | POA: Diagnosis not present

## 2024-07-07 DIAGNOSIS — K219 Gastro-esophageal reflux disease without esophagitis: Secondary | ICD-10-CM | POA: Diagnosis not present

## 2024-07-11 DIAGNOSIS — F17210 Nicotine dependence, cigarettes, uncomplicated: Secondary | ICD-10-CM | POA: Diagnosis not present

## 2024-07-11 DIAGNOSIS — K219 Gastro-esophageal reflux disease without esophagitis: Secondary | ICD-10-CM | POA: Diagnosis not present

## 2024-07-11 DIAGNOSIS — R7303 Prediabetes: Secondary | ICD-10-CM | POA: Diagnosis not present

## 2024-07-11 DIAGNOSIS — E785 Hyperlipidemia, unspecified: Secondary | ICD-10-CM | POA: Diagnosis not present

## 2024-07-11 DIAGNOSIS — I619 Nontraumatic intracerebral hemorrhage, unspecified: Secondary | ICD-10-CM | POA: Diagnosis not present

## 2024-07-11 DIAGNOSIS — Z860100 Personal history of colon polyps, unspecified: Secondary | ICD-10-CM | POA: Diagnosis not present

## 2024-07-11 DIAGNOSIS — N1831 Chronic kidney disease, stage 3a: Secondary | ICD-10-CM | POA: Diagnosis not present

## 2024-07-11 DIAGNOSIS — J8410 Pulmonary fibrosis, unspecified: Secondary | ICD-10-CM | POA: Diagnosis not present

## 2024-07-11 DIAGNOSIS — R339 Retention of urine, unspecified: Secondary | ICD-10-CM | POA: Diagnosis not present

## 2024-07-11 DIAGNOSIS — J449 Chronic obstructive pulmonary disease, unspecified: Secondary | ICD-10-CM | POA: Diagnosis not present

## 2024-07-11 DIAGNOSIS — E876 Hypokalemia: Secondary | ICD-10-CM | POA: Diagnosis not present

## 2024-07-11 DIAGNOSIS — G4700 Insomnia, unspecified: Secondary | ICD-10-CM | POA: Diagnosis not present

## 2024-07-11 DIAGNOSIS — M810 Age-related osteoporosis without current pathological fracture: Secondary | ICD-10-CM | POA: Diagnosis not present

## 2024-07-11 DIAGNOSIS — Z79899 Other long term (current) drug therapy: Secondary | ICD-10-CM | POA: Diagnosis not present

## 2024-07-11 DIAGNOSIS — I129 Hypertensive chronic kidney disease with stage 1 through stage 4 chronic kidney disease, or unspecified chronic kidney disease: Secondary | ICD-10-CM | POA: Diagnosis not present

## 2024-07-11 DIAGNOSIS — I719 Aortic aneurysm of unspecified site, without rupture: Secondary | ICD-10-CM | POA: Diagnosis not present

## 2024-07-11 DIAGNOSIS — Z556 Problems related to health literacy: Secondary | ICD-10-CM | POA: Diagnosis not present

## 2024-07-14 DIAGNOSIS — E876 Hypokalemia: Secondary | ICD-10-CM | POA: Diagnosis not present

## 2024-07-14 DIAGNOSIS — K219 Gastro-esophageal reflux disease without esophagitis: Secondary | ICD-10-CM | POA: Diagnosis not present

## 2024-07-14 DIAGNOSIS — I129 Hypertensive chronic kidney disease with stage 1 through stage 4 chronic kidney disease, or unspecified chronic kidney disease: Secondary | ICD-10-CM | POA: Diagnosis not present

## 2024-07-14 DIAGNOSIS — Z860100 Personal history of colon polyps, unspecified: Secondary | ICD-10-CM | POA: Diagnosis not present

## 2024-07-14 DIAGNOSIS — Z556 Problems related to health literacy: Secondary | ICD-10-CM | POA: Diagnosis not present

## 2024-07-14 DIAGNOSIS — F17210 Nicotine dependence, cigarettes, uncomplicated: Secondary | ICD-10-CM | POA: Diagnosis not present

## 2024-07-14 DIAGNOSIS — I719 Aortic aneurysm of unspecified site, without rupture: Secondary | ICD-10-CM | POA: Diagnosis not present

## 2024-07-14 DIAGNOSIS — J449 Chronic obstructive pulmonary disease, unspecified: Secondary | ICD-10-CM | POA: Diagnosis not present

## 2024-07-14 DIAGNOSIS — M810 Age-related osteoporosis without current pathological fracture: Secondary | ICD-10-CM | POA: Diagnosis not present

## 2024-07-14 DIAGNOSIS — I619 Nontraumatic intracerebral hemorrhage, unspecified: Secondary | ICD-10-CM | POA: Diagnosis not present

## 2024-07-14 DIAGNOSIS — R339 Retention of urine, unspecified: Secondary | ICD-10-CM | POA: Diagnosis not present

## 2024-07-14 DIAGNOSIS — E785 Hyperlipidemia, unspecified: Secondary | ICD-10-CM | POA: Diagnosis not present

## 2024-07-14 DIAGNOSIS — G4700 Insomnia, unspecified: Secondary | ICD-10-CM | POA: Diagnosis not present

## 2024-07-14 DIAGNOSIS — N1831 Chronic kidney disease, stage 3a: Secondary | ICD-10-CM | POA: Diagnosis not present

## 2024-07-14 DIAGNOSIS — Z79899 Other long term (current) drug therapy: Secondary | ICD-10-CM | POA: Diagnosis not present

## 2024-07-14 DIAGNOSIS — J8410 Pulmonary fibrosis, unspecified: Secondary | ICD-10-CM | POA: Diagnosis not present

## 2024-07-14 DIAGNOSIS — R7303 Prediabetes: Secondary | ICD-10-CM | POA: Diagnosis not present

## 2024-07-15 DIAGNOSIS — K219 Gastro-esophageal reflux disease without esophagitis: Secondary | ICD-10-CM | POA: Diagnosis not present

## 2024-07-15 DIAGNOSIS — I719 Aortic aneurysm of unspecified site, without rupture: Secondary | ICD-10-CM | POA: Diagnosis not present

## 2024-07-15 DIAGNOSIS — N1831 Chronic kidney disease, stage 3a: Secondary | ICD-10-CM | POA: Diagnosis not present

## 2024-07-15 DIAGNOSIS — R7303 Prediabetes: Secondary | ICD-10-CM | POA: Diagnosis not present

## 2024-07-15 DIAGNOSIS — F17210 Nicotine dependence, cigarettes, uncomplicated: Secondary | ICD-10-CM | POA: Diagnosis not present

## 2024-07-15 DIAGNOSIS — M810 Age-related osteoporosis without current pathological fracture: Secondary | ICD-10-CM | POA: Diagnosis not present

## 2024-07-15 DIAGNOSIS — E785 Hyperlipidemia, unspecified: Secondary | ICD-10-CM | POA: Diagnosis not present

## 2024-07-15 DIAGNOSIS — E876 Hypokalemia: Secondary | ICD-10-CM | POA: Diagnosis not present

## 2024-07-15 DIAGNOSIS — R339 Retention of urine, unspecified: Secondary | ICD-10-CM | POA: Diagnosis not present

## 2024-07-15 DIAGNOSIS — J8410 Pulmonary fibrosis, unspecified: Secondary | ICD-10-CM | POA: Diagnosis not present

## 2024-07-15 DIAGNOSIS — Z556 Problems related to health literacy: Secondary | ICD-10-CM | POA: Diagnosis not present

## 2024-07-15 DIAGNOSIS — G4700 Insomnia, unspecified: Secondary | ICD-10-CM | POA: Diagnosis not present

## 2024-07-15 DIAGNOSIS — I129 Hypertensive chronic kidney disease with stage 1 through stage 4 chronic kidney disease, or unspecified chronic kidney disease: Secondary | ICD-10-CM | POA: Diagnosis not present

## 2024-07-15 DIAGNOSIS — Z79899 Other long term (current) drug therapy: Secondary | ICD-10-CM | POA: Diagnosis not present

## 2024-07-15 DIAGNOSIS — J449 Chronic obstructive pulmonary disease, unspecified: Secondary | ICD-10-CM | POA: Diagnosis not present

## 2024-07-15 DIAGNOSIS — I619 Nontraumatic intracerebral hemorrhage, unspecified: Secondary | ICD-10-CM | POA: Diagnosis not present

## 2024-07-15 DIAGNOSIS — Z860100 Personal history of colon polyps, unspecified: Secondary | ICD-10-CM | POA: Diagnosis not present

## 2024-07-19 DIAGNOSIS — R7303 Prediabetes: Secondary | ICD-10-CM | POA: Diagnosis not present

## 2024-07-19 DIAGNOSIS — G4700 Insomnia, unspecified: Secondary | ICD-10-CM | POA: Diagnosis not present

## 2024-07-19 DIAGNOSIS — R339 Retention of urine, unspecified: Secondary | ICD-10-CM | POA: Diagnosis not present

## 2024-07-19 DIAGNOSIS — I719 Aortic aneurysm of unspecified site, without rupture: Secondary | ICD-10-CM | POA: Diagnosis not present

## 2024-07-19 DIAGNOSIS — N1831 Chronic kidney disease, stage 3a: Secondary | ICD-10-CM | POA: Diagnosis not present

## 2024-07-19 DIAGNOSIS — F17210 Nicotine dependence, cigarettes, uncomplicated: Secondary | ICD-10-CM | POA: Diagnosis not present

## 2024-07-19 DIAGNOSIS — Z860100 Personal history of colon polyps, unspecified: Secondary | ICD-10-CM | POA: Diagnosis not present

## 2024-07-19 DIAGNOSIS — Z79899 Other long term (current) drug therapy: Secondary | ICD-10-CM | POA: Diagnosis not present

## 2024-07-19 DIAGNOSIS — I619 Nontraumatic intracerebral hemorrhage, unspecified: Secondary | ICD-10-CM | POA: Diagnosis not present

## 2024-07-19 DIAGNOSIS — E785 Hyperlipidemia, unspecified: Secondary | ICD-10-CM | POA: Diagnosis not present

## 2024-07-19 DIAGNOSIS — Z556 Problems related to health literacy: Secondary | ICD-10-CM | POA: Diagnosis not present

## 2024-07-19 DIAGNOSIS — J449 Chronic obstructive pulmonary disease, unspecified: Secondary | ICD-10-CM | POA: Diagnosis not present

## 2024-07-19 DIAGNOSIS — E876 Hypokalemia: Secondary | ICD-10-CM | POA: Diagnosis not present

## 2024-07-19 DIAGNOSIS — I129 Hypertensive chronic kidney disease with stage 1 through stage 4 chronic kidney disease, or unspecified chronic kidney disease: Secondary | ICD-10-CM | POA: Diagnosis not present

## 2024-07-19 DIAGNOSIS — M810 Age-related osteoporosis without current pathological fracture: Secondary | ICD-10-CM | POA: Diagnosis not present

## 2024-07-19 DIAGNOSIS — K219 Gastro-esophageal reflux disease without esophagitis: Secondary | ICD-10-CM | POA: Diagnosis not present

## 2024-07-19 DIAGNOSIS — J8410 Pulmonary fibrosis, unspecified: Secondary | ICD-10-CM | POA: Diagnosis not present

## 2024-07-20 ENCOUNTER — Ambulatory Visit: Admitting: Internal Medicine

## 2024-07-21 DIAGNOSIS — Z79899 Other long term (current) drug therapy: Secondary | ICD-10-CM | POA: Diagnosis not present

## 2024-07-21 DIAGNOSIS — R339 Retention of urine, unspecified: Secondary | ICD-10-CM | POA: Diagnosis not present

## 2024-07-21 DIAGNOSIS — Z556 Problems related to health literacy: Secondary | ICD-10-CM | POA: Diagnosis not present

## 2024-07-21 DIAGNOSIS — J8410 Pulmonary fibrosis, unspecified: Secondary | ICD-10-CM | POA: Diagnosis not present

## 2024-07-21 DIAGNOSIS — N1831 Chronic kidney disease, stage 3a: Secondary | ICD-10-CM | POA: Diagnosis not present

## 2024-07-21 DIAGNOSIS — Z860100 Personal history of colon polyps, unspecified: Secondary | ICD-10-CM | POA: Diagnosis not present

## 2024-07-21 DIAGNOSIS — J449 Chronic obstructive pulmonary disease, unspecified: Secondary | ICD-10-CM | POA: Diagnosis not present

## 2024-07-21 DIAGNOSIS — R7303 Prediabetes: Secondary | ICD-10-CM | POA: Diagnosis not present

## 2024-07-21 DIAGNOSIS — I619 Nontraumatic intracerebral hemorrhage, unspecified: Secondary | ICD-10-CM | POA: Diagnosis not present

## 2024-07-21 DIAGNOSIS — E876 Hypokalemia: Secondary | ICD-10-CM | POA: Diagnosis not present

## 2024-07-21 DIAGNOSIS — E785 Hyperlipidemia, unspecified: Secondary | ICD-10-CM | POA: Diagnosis not present

## 2024-07-21 DIAGNOSIS — I719 Aortic aneurysm of unspecified site, without rupture: Secondary | ICD-10-CM | POA: Diagnosis not present

## 2024-07-21 DIAGNOSIS — I129 Hypertensive chronic kidney disease with stage 1 through stage 4 chronic kidney disease, or unspecified chronic kidney disease: Secondary | ICD-10-CM | POA: Diagnosis not present

## 2024-07-21 DIAGNOSIS — K219 Gastro-esophageal reflux disease without esophagitis: Secondary | ICD-10-CM | POA: Diagnosis not present

## 2024-07-21 DIAGNOSIS — F17210 Nicotine dependence, cigarettes, uncomplicated: Secondary | ICD-10-CM | POA: Diagnosis not present

## 2024-07-21 DIAGNOSIS — M810 Age-related osteoporosis without current pathological fracture: Secondary | ICD-10-CM | POA: Diagnosis not present

## 2024-07-21 DIAGNOSIS — G4700 Insomnia, unspecified: Secondary | ICD-10-CM | POA: Diagnosis not present

## 2024-07-26 DIAGNOSIS — K219 Gastro-esophageal reflux disease without esophagitis: Secondary | ICD-10-CM | POA: Diagnosis not present

## 2024-07-26 DIAGNOSIS — Z556 Problems related to health literacy: Secondary | ICD-10-CM | POA: Diagnosis not present

## 2024-07-26 DIAGNOSIS — R339 Retention of urine, unspecified: Secondary | ICD-10-CM | POA: Diagnosis not present

## 2024-07-26 DIAGNOSIS — Z79899 Other long term (current) drug therapy: Secondary | ICD-10-CM | POA: Diagnosis not present

## 2024-07-26 DIAGNOSIS — R7303 Prediabetes: Secondary | ICD-10-CM | POA: Diagnosis not present

## 2024-07-26 DIAGNOSIS — G4700 Insomnia, unspecified: Secondary | ICD-10-CM | POA: Diagnosis not present

## 2024-07-26 DIAGNOSIS — Z860100 Personal history of colon polyps, unspecified: Secondary | ICD-10-CM | POA: Diagnosis not present

## 2024-07-26 DIAGNOSIS — N1831 Chronic kidney disease, stage 3a: Secondary | ICD-10-CM | POA: Diagnosis not present

## 2024-07-26 DIAGNOSIS — M810 Age-related osteoporosis without current pathological fracture: Secondary | ICD-10-CM | POA: Diagnosis not present

## 2024-07-26 DIAGNOSIS — F17210 Nicotine dependence, cigarettes, uncomplicated: Secondary | ICD-10-CM | POA: Diagnosis not present

## 2024-07-26 DIAGNOSIS — J8410 Pulmonary fibrosis, unspecified: Secondary | ICD-10-CM | POA: Diagnosis not present

## 2024-07-26 DIAGNOSIS — I619 Nontraumatic intracerebral hemorrhage, unspecified: Secondary | ICD-10-CM | POA: Diagnosis not present

## 2024-07-26 DIAGNOSIS — J449 Chronic obstructive pulmonary disease, unspecified: Secondary | ICD-10-CM | POA: Diagnosis not present

## 2024-07-26 DIAGNOSIS — E876 Hypokalemia: Secondary | ICD-10-CM | POA: Diagnosis not present

## 2024-07-26 DIAGNOSIS — I719 Aortic aneurysm of unspecified site, without rupture: Secondary | ICD-10-CM | POA: Diagnosis not present

## 2024-07-26 DIAGNOSIS — I129 Hypertensive chronic kidney disease with stage 1 through stage 4 chronic kidney disease, or unspecified chronic kidney disease: Secondary | ICD-10-CM | POA: Diagnosis not present

## 2024-07-26 DIAGNOSIS — E785 Hyperlipidemia, unspecified: Secondary | ICD-10-CM | POA: Diagnosis not present

## 2024-07-27 DIAGNOSIS — F17210 Nicotine dependence, cigarettes, uncomplicated: Secondary | ICD-10-CM | POA: Diagnosis not present

## 2024-07-27 DIAGNOSIS — I129 Hypertensive chronic kidney disease with stage 1 through stage 4 chronic kidney disease, or unspecified chronic kidney disease: Secondary | ICD-10-CM | POA: Diagnosis not present

## 2024-07-27 DIAGNOSIS — I719 Aortic aneurysm of unspecified site, without rupture: Secondary | ICD-10-CM | POA: Diagnosis not present

## 2024-07-27 DIAGNOSIS — E876 Hypokalemia: Secondary | ICD-10-CM | POA: Diagnosis not present

## 2024-07-27 DIAGNOSIS — J8410 Pulmonary fibrosis, unspecified: Secondary | ICD-10-CM | POA: Diagnosis not present

## 2024-07-27 DIAGNOSIS — Z860100 Personal history of colon polyps, unspecified: Secondary | ICD-10-CM | POA: Diagnosis not present

## 2024-07-27 DIAGNOSIS — E785 Hyperlipidemia, unspecified: Secondary | ICD-10-CM | POA: Diagnosis not present

## 2024-07-27 DIAGNOSIS — R339 Retention of urine, unspecified: Secondary | ICD-10-CM | POA: Diagnosis not present

## 2024-07-27 DIAGNOSIS — I619 Nontraumatic intracerebral hemorrhage, unspecified: Secondary | ICD-10-CM | POA: Diagnosis not present

## 2024-07-27 DIAGNOSIS — Z556 Problems related to health literacy: Secondary | ICD-10-CM | POA: Diagnosis not present

## 2024-07-27 DIAGNOSIS — N1831 Chronic kidney disease, stage 3a: Secondary | ICD-10-CM | POA: Diagnosis not present

## 2024-07-27 DIAGNOSIS — G4700 Insomnia, unspecified: Secondary | ICD-10-CM | POA: Diagnosis not present

## 2024-07-27 DIAGNOSIS — M810 Age-related osteoporosis without current pathological fracture: Secondary | ICD-10-CM | POA: Diagnosis not present

## 2024-07-27 DIAGNOSIS — J449 Chronic obstructive pulmonary disease, unspecified: Secondary | ICD-10-CM | POA: Diagnosis not present

## 2024-07-27 DIAGNOSIS — R7303 Prediabetes: Secondary | ICD-10-CM | POA: Diagnosis not present

## 2024-07-27 DIAGNOSIS — Z79899 Other long term (current) drug therapy: Secondary | ICD-10-CM | POA: Diagnosis not present

## 2024-07-27 DIAGNOSIS — K219 Gastro-esophageal reflux disease without esophagitis: Secondary | ICD-10-CM | POA: Diagnosis not present

## 2024-07-28 ENCOUNTER — Other Ambulatory Visit: Payer: Self-pay

## 2024-07-29 ENCOUNTER — Telehealth: Payer: Self-pay | Admitting: Internal Medicine

## 2024-07-29 NOTE — Telephone Encounter (Signed)
 Overnight pulse oximetry done on June 15, 2024 for 3 hours and 34 minutes shows pulse ox greater than 90% for all of the time.  No desaturations.  Therefore does not qualify for night oxygen.

## 2024-08-01 ENCOUNTER — Other Ambulatory Visit: Payer: Self-pay

## 2024-08-01 NOTE — Progress Notes (Signed)
 Specialty Pharmacy Refill Coordination Note  Alison Campbell is a 78 y.o. female contacted today regarding refills of specialty medication(s) Pirfenidone   Spoke with patient's daughter  Patient requested Delivery   Delivery date: 08/03/24   Verified address: [address]  [place]   Medication will be filled on 08.05.25.

## 2024-08-02 ENCOUNTER — Other Ambulatory Visit: Payer: Self-pay

## 2024-08-03 ENCOUNTER — Encounter (INDEPENDENT_AMBULATORY_CARE_PROVIDER_SITE_OTHER): Payer: Self-pay

## 2024-08-04 ENCOUNTER — Other Ambulatory Visit: Payer: Self-pay

## 2024-08-09 ENCOUNTER — Ambulatory Visit (INDEPENDENT_AMBULATORY_CARE_PROVIDER_SITE_OTHER): Admitting: Family Medicine

## 2024-08-09 ENCOUNTER — Encounter: Payer: Self-pay | Admitting: Internal Medicine

## 2024-08-09 VITALS — BP 122/80 | HR 81 | Temp 97.6°F | Ht 63.5 in | Wt 133.4 lb

## 2024-08-09 DIAGNOSIS — I10 Essential (primary) hypertension: Secondary | ICD-10-CM | POA: Diagnosis not present

## 2024-08-09 DIAGNOSIS — I639 Cerebral infarction, unspecified: Secondary | ICD-10-CM

## 2024-08-09 DIAGNOSIS — R7309 Other abnormal glucose: Secondary | ICD-10-CM

## 2024-08-09 DIAGNOSIS — E785 Hyperlipidemia, unspecified: Secondary | ICD-10-CM

## 2024-08-09 DIAGNOSIS — G4700 Insomnia, unspecified: Secondary | ICD-10-CM

## 2024-08-09 DIAGNOSIS — Z7689 Persons encountering health services in other specified circumstances: Secondary | ICD-10-CM | POA: Diagnosis not present

## 2024-08-09 DIAGNOSIS — Z79899 Other long term (current) drug therapy: Secondary | ICD-10-CM

## 2024-08-09 DIAGNOSIS — Z1231 Encounter for screening mammogram for malignant neoplasm of breast: Secondary | ICD-10-CM

## 2024-08-09 DIAGNOSIS — J849 Interstitial pulmonary disease, unspecified: Secondary | ICD-10-CM

## 2024-08-09 DIAGNOSIS — F17200 Nicotine dependence, unspecified, uncomplicated: Secondary | ICD-10-CM | POA: Diagnosis not present

## 2024-08-09 MED ORDER — VARENICLINE TARTRATE (STARTER) 0.5 MG X 11 & 1 MG X 42 PO TBPK: ORAL_TABLET | ORAL | 0 refills | Status: AC

## 2024-08-09 MED ORDER — VARENICLINE TARTRATE 0.5 MG PO TABS: 0.5000 mg | ORAL_TABLET | Freq: Two times a day (BID) | ORAL | 2 refills | Status: AC

## 2024-08-09 MED ORDER — QUETIAPINE FUMARATE 50 MG PO TABS: 50.0000 mg | ORAL_TABLET | Freq: Every day | ORAL | 1 refills | Status: DC

## 2024-08-09 NOTE — Progress Notes (Signed)
 Patient Office Visit  Assessment & Plan:  Essential hypertension -     CBC with Differential/Platelet -     Comprehensive metabolic panel with GFR  Abnormal glucose  Insomnia, unspecified type -     QUEtiapine  Fumarate; Take 1 tablet (50 mg total) by mouth at bedtime.  Dispense: 90 tablet; Refill: 1  Encounter to establish care  Hyperlipidemia, unspecified hyperlipidemia type -     Lipid panel  Taking a statin medication -     Comprehensive metabolic panel with GFR  Cerebrovascular accident (CVA), unspecified mechanism (HCC)  ILD (interstitial lung disease) (HCC)  Screening mammogram for breast cancer -     3D Screening Mammogram, Left and Right; Future  Tobacco use disorder -     Varenicline  Tartrate (Starter); Starter pack  Dispense: 53 each; Refill: 0 -     Varenicline  Tartrate; Take 1 tablet (0.5 mg total) by mouth 2 (two) times daily.  Dispense: 60 tablet; Refill: 2   Assessment and Plan           Test results were reviewed and analyzed as part of the medical decision making of this visit.  Upon lab work notify patient.  Return in 3 -4 months or sooner if necessary.  Chantix  starter pack and continue pack sent to the pharmacy.  Patient is aware of potential side effects. Patient will consider Shingrix vaccines next time. Mammogram ordered. Patient would like to hold off on repeating colonoscopy at this time Recommend healthy diet i.e mediterranean/DASH diet, consistent exercise - 30 minutes 5 day per week, and gradual weight loss.  Return in about 4 months (around 12/09/2024), or if symptoms worsen or fail to improve.   Subjective:    Patient ID: Alison Campbell, female    DOB: [birth year]  Age: 78 y.o. MRN: [id number]  Chief Complaint  Patient presents with   Medical Management of Chronic Issues   Establish Care    HPI  History of Present Illness         Patient is here to establish medical care with me.  Previous physician passed away earlier this  year.  Past medical history significant for hypertension, hyperlipidemia, idiopathic pulmonary fibrosis, insomnia, osteopororis, recent stroke in June and tobacco use.   HTN-using antihypertensive medication without difficulty.  Denies associated signs and symptoms including chest pain, shortness of breath, cough headache, peripheral swelling cramps spasms and palpitations.  Voices understanding of the potential for interference with blood pressure control with substances including high sodium intake, decongestions, herbal supplements weight loss supplements nutritional supplements.  Blood pressures at home are less than 140/90.   Stroke-will be seeing [facility] in the near future due to the stroke.  Patient also has an upcoming neurology appointment again for neurologic.  Patient did do home physical therapy which helped.  Patient does still have left thigh muscle discomfort but otherwise feels like she is back to normal.  Patient has not had any recent falls. Previous history of elevated glucose- patient had normal A1C this past June Hyperlipidemia-denies unusual muscle aches or muscle cramps or difficulty tolerating statin therapy.  Aware of need for diet control, exercise and healthy eating.   Insomnia-while she was in the hospital did prescribe Seroquel  50 mg at night which was very effective and allowed her to sleep 7 or 8 hours at night without a.m. grogginess.  Patient would like to continue this medication. Tobacco use-patient has been smoking for over 50 years.  Quit 1 time for about 9  years.  Patient smoking about three quarters of a pack a day right now and enjoys it.  Patient is not sure if she is ready to quit.  Patient has tried NicoDerm patches gum and vaping which were ineffective.  Patient does not think she ever tried the Chantix  tablets.  Patient is willing to consider doing this. Health maintenance-patient no longer doing colonoscopies.  She was due for one last year but decided  not to do it.  Patient has not had a mammogram in a few years.  Patient has not had the Shingrix vaccine and will think about this.   The ASCVD Risk score (Arnett DK, et al., 2019) failed to calculate for the following reasons:   The 2019 ASCVD risk score is only valid for ages 41 to 73   Risk score cannot be calculated because patient has a medical history suggesting prior/existing ASCVD  Past Medical History:  Diagnosis Date   Allergy 06/08/24   Iohexol  and iodinated contrast dyes   Anxiety    Atherosclerosis    Cataract 08/01/24   CKD (chronic kidney disease) stage 3, GFR 30-59 ml/min (HCC) 12/24/2020   Colon polyps    COPD (chronic obstructive pulmonary disease) (HCC)    pulmonary fibrosis   Costochondritis    Elevated blood pressure reading    no meds   Emphysema of lung (HCC)    GERD (gastroesophageal reflux disease)    coffee related   Hyperlipidemia    Hypertension    Insomnia    Osteoporosis    Pre-diabetes    Past Surgical History:  Procedure Laterality Date   ABDOMINAL HYSTERECTOMY     COLONOSCOPY  09/10/2011   hysterectomy     INTERCOSTAL NERVE BLOCK Right 08/15/2022   Procedure: INTERCOSTAL NERVE BLOCK;  Surgeon: Kerrin Elspeth BROCKS, MD;  Location: [facility] OR;  Service: Thoracic;  Laterality: Right;   LUNG BIOPSY Right 08/15/2022   Procedure: LUNG BIOPSY;  Surgeon: Kerrin Elspeth BROCKS, MD;  Location: [facility] OR;  Service: Thoracic;  Laterality: Right;   ORIF PATELLA Right 10/30/2023   Procedure: OPEN REDUCTION INTERNAL FIXATION (ORIF) PATELLA;  Surgeon: Kay Kemps, MD;  Location: [facility] ORS;  Service: Orthopedics;  Laterality: Right;  90   POLYPECTOMY     UVULECTOMY N/A 09/09/2016   Procedure: PARTIAL UVULECTOMY, direct laryngoscopy;  Surgeon: Lonni FORBES Angle, MD;  Location: [facility];  Service: ENT;  Laterality: N/A;  PARTIAL UVULECTOMY, direct laryngoscopy   Social History   Tobacco Use   Smoking status: Every Day    Current packs/day: 0.00     Average packs/day: 1 pack/day for 57.6 years (57.6 ttl pk-yrs)    Types: Cigarettes    Start date: 1966    Last attempt to quit: 08/15/2022    Years since quitting: 1.9   Smokeless tobacco: Never  Vaping Use   Vaping status: Never Used  Substance Use Topics   Alcohol use: No    Alcohol/week: 0.0 standard drinks of alcohol   Drug use: No   Family History  Problem Relation Age of Onset   Heart disease Mother    Heart attack Mother    Heart disease Father    Diabetes Father    Heart attack Father    Cancer Brother        Lung   Bipolar disorder Daughter    Colon cancer Neg Hx    Esophageal cancer Neg Hx    Stomach cancer Neg Hx    Rectal cancer Neg  Hx    Allergies  Allergen Reactions   Iodinated Contrast Media Other (See Comments)    Pt states she gets viscous when contrast is administered.    Omnipaque  [Iohexol ] Other (See Comments)    EXTREME AGITATION   Fish Oil Diarrhea   Spiriva  Respimat [Tiotropium Bromide Monohydrate ] Cough    Gaging and excessive phlegm    ROS    Objective:    BP 122/80   Pulse 81   Temp 97.6 F (36.4 C)   Ht 5' 3.5 (1.613 m)   Wt 133 lb 6 oz (60.5 kg)   SpO2 98%   BMI 23.26 kg/m  BP Readings from Last 3 Encounters:  08/09/24 122/80  06/14/24 118/63  06/14/24 135/67   Wt Readings from Last 3 Encounters:  08/09/24 133 lb 6 oz (60.5 kg)  06/14/24 130 lb (59 kg)  06/07/24 138 lb 7.2 oz (62.8 kg)    Physical Exam Vitals and nursing note reviewed.  Constitutional:      General: She is not in acute distress.    Appearance: Normal appearance.     Comments: Comes in with her daughter  HENT:     Head: Normocephalic.     Right Ear: Tympanic membrane, ear canal and external ear normal.     Left Ear: Tympanic membrane, ear canal and external ear normal.  Eyes:     Extraocular Movements: Extraocular movements intact.     Pupils: Pupils are equal, round, and reactive to light.  Cardiovascular:     Rate and Rhythm: Normal rate  and regular rhythm.     Heart sounds: Normal heart sounds.  Pulmonary:     Effort: Pulmonary effort is normal.     Breath sounds: Normal breath sounds. No wheezing.  Musculoskeletal:     Right lower leg: No edema.     Left lower leg: No edema.  Neurological:     General: No focal deficit present.     Mental Status: She is alert and oriented to person, place, and time.     Motor: No weakness.     Coordination: Romberg sign negative. Coordination normal. Finger-Nose-Finger Test normal.     Gait: Gait is intact. Gait normal.  Psychiatric:        Mood and Affect: Mood normal.        Behavior: Behavior normal.        Thought Content: Thought content normal.        Judgment: Judgment normal.      No results found for any visits on 08/09/24.

## 2024-08-10 ENCOUNTER — Ambulatory Visit: Payer: Self-pay | Admitting: Family Medicine

## 2024-08-10 LAB — LIPID PANEL
Cholesterol: 113 mg/dL (ref <200)
HDL: 46 mg/dL — ABNORMAL LOW (ref >=50)
LDL Cholesterol (Calc): 41 mg/dL
Non-HDL Cholesterol (Calc): 67 mg/dL (ref <130)
Total CHOL/HDL Ratio: 2.5 (calc) (ref <5.0)
Triglycerides: 179 mg/dL — ABNORMAL HIGH (ref <150)

## 2024-08-10 LAB — COMPREHENSIVE METABOLIC PANEL WITH GFR
AG Ratio: 1.8 (calc) (ref 1.0–2.5)
ALT: 19 U/L (ref 6–29)
AST: 15 U/L (ref 10–35)
Albumin: 4.4 g/dL (ref 3.6–5.1)
Alkaline phosphatase (APISO): 59 U/L (ref 37–153)
BUN/Creatinine Ratio: 26 (calc) — ABNORMAL HIGH (ref 6–22)
BUN: 25 mg/dL (ref 7–25)
CO2: 29 mmol/L (ref 20–32)
Calcium: 9.7 mg/dL (ref 8.6–10.4)
Chloride: 107 mmol/L (ref 98–110)
Creat: 0.96 mg/dL — ABNORMAL HIGH (ref 0.60–0.95)
Globulin: 2.5 g/dL (ref 1.9–3.7)
Glucose, Bld: 75 mg/dL (ref 65–99)
Potassium: 4.8 mmol/L (ref 3.5–5.3)
Sodium: 142 mmol/L (ref 135–146)
Total Bilirubin: 0.2 mg/dL (ref 0.2–1.2)
Total Protein: 6.9 g/dL (ref 6.1–8.1)
eGFR: 60 mL/min/1.73m2 (ref >=60)

## 2024-08-10 LAB — CBC WITH DIFFERENTIAL/PLATELET
Absolute Lymphocytes: 2052 {cells}/uL (ref 850–3900)
Absolute Monocytes: 653 {cells}/uL (ref 200–950)
Basophils Absolute: 46 {cells}/uL (ref 0–200)
Basophils Relative: 0.5 %
Eosinophils Absolute: 184 {cells}/uL (ref 15–500)
Eosinophils Relative: 2 %
HCT: 40.5 % (ref 35.0–45.0)
Hemoglobin: 13.1 g/dL (ref 11.7–15.5)
MCH: 31.2 pg (ref 27.0–33.0)
MCHC: 32.3 g/dL (ref 32.0–36.0)
MCV: 96.4 fL (ref 80.0–100.0)
MPV: 10.7 fL (ref 7.5–12.5)
Monocytes Relative: 7.1 %
Neutro Abs: 6265 {cells}/uL (ref 1500–7800)
Neutrophils Relative %: 68.1 %
Platelets: 295 Thousand/uL (ref 140–400)
RBC: 4.2 Million/uL (ref 3.80–5.10)
RDW: 12.8 % (ref 11.0–15.0)
Total Lymphocyte: 22.3 %
WBC: 9.2 Thousand/uL (ref 3.8–10.8)

## 2024-08-15 ENCOUNTER — Ambulatory Visit: Payer: Medicare Other | Admitting: Nurse Practitioner

## 2024-08-22 ENCOUNTER — Other Ambulatory Visit: Payer: Self-pay | Admitting: *Deleted

## 2024-08-22 DIAGNOSIS — J84112 Idiopathic pulmonary fibrosis: Secondary | ICD-10-CM

## 2024-08-23 ENCOUNTER — Ambulatory Visit (INDEPENDENT_AMBULATORY_CARE_PROVIDER_SITE_OTHER): Admitting: Internal Medicine

## 2024-08-23 ENCOUNTER — Encounter: Payer: Self-pay | Admitting: Internal Medicine

## 2024-08-23 VITALS — BP 114/68 | HR 73 | Ht 62.0 in | Wt 134.0 lb

## 2024-08-23 DIAGNOSIS — F17210 Nicotine dependence, cigarettes, uncomplicated: Secondary | ICD-10-CM | POA: Diagnosis not present

## 2024-08-23 DIAGNOSIS — F17200 Nicotine dependence, unspecified, uncomplicated: Secondary | ICD-10-CM

## 2024-08-23 DIAGNOSIS — J84112 Idiopathic pulmonary fibrosis: Secondary | ICD-10-CM

## 2024-08-23 DIAGNOSIS — Z5181 Encounter for therapeutic drug level monitoring: Secondary | ICD-10-CM | POA: Diagnosis not present

## 2024-08-23 DIAGNOSIS — Z122 Encounter for screening for malignant neoplasm of respiratory organs: Secondary | ICD-10-CM

## 2024-08-23 LAB — PULMONARY FUNCTION TEST
DL/VA % pred: 74 %
DL/VA: 3.08 ml/min/mmHg/L
DLCO cor % pred: 62 %
DLCO cor: 10.91 ml/min/mmHg
DLCO unc % pred: 61 %
DLCO unc: 10.8 ml/min/mmHg
FEF 25-75 Pre: 1.77 L/s
FEF2575-%Pred-Pre: 136 %
FEV1-%Pred-Pre: 111 %
FEV1-Pre: 1.96 L
FEV1FVC-%Pred-Pre: 105 %
FEV6-%Pred-Pre: 112 %
FEV6-Pre: 2.51 L
FEV6FVC-%Pred-Pre: 105 %
FVC-%Pred-Pre: 105 %
FVC-Pre: 2.51 L
Pre FEV1/FVC ratio: 78 %
Pre FEV6/FVC Ratio: 100 %

## 2024-08-23 NOTE — Progress Notes (Signed)
 Synopsis: Referred in November 2022 for lung nodule by Tonita Fallow, MD  Subjective:   PATIENT ID: Camie Levorn Budge GENDER: female DOB: Mar 04, 1946, MRN: [id number]  Chief Complaint  Patient presents with   Follow-up    Follow up. Patient says she's been coughing up a lot of phlegm.     This is a 78 year old female, past medical history of gastroesophageal reflux, CKD, hyperlipidemia.Patient had a lung cancer screening CT on 11/02/2021.  Lung cancer screening CT revealed multiple small pulmonary nodules.  There was a new pleural-based nodule in the right upper lobe.  With a mean dry volume of 5.3 mm in diameter.  No other significant suspicious lesions.  Also has associated centrilobular and paraseptal emphysema.  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  Patient was referred to pulmonary for evaluation.  OV 11/13/2021: Here today to discuss abnormal CT imaging.  We also had a long discussion regarding smoking cessation.  Please see separate documentation regarding smoking cessation.  Patient has smoked since age 59.  She quit for 9 years however has had approximately 40+ pack years smoking at around half to 1 full pack per day.  OV 05/29/2022: Here today for follow-up recent CT scan.CT scan of the chest was completed on 05/09/2022 this was in follow-up of a right upper lobe pulmonary nodule.  Findings felt to be consistent with infection or inflammatory lesion on CT.  There is unchanged mild pulmonary fibrosis with apical to basal gradient irregular peripheral interstitial opacities groundglass and varicoid bronchiectasis without any evidence of honeycombing.  Findings on the CT were suggestive of an alternate diagnosis besides UIP.  From a respiratory standpoint she is doing okay.  She is not very active so she is not seeing much difference in her shortness of breath related to exertion.   OV 07/03/2022  Subjective:  Patient  ID: Marchele Decock, female , DOB: 22-Jun-1946 , age 3 y.o. , MRN: [id number] , ADDRESS: [address] [place] [address] PCP Tonita Fallow, MD Patient Care Team: Tonita Fallow, MD as PCP - General (Internal Medicine) Teressa Toribio SQUIBB, MD as Attending Physician (Gastroenterology) Jakie Alm SAUNDERS, MD as Consulting Physician (Gastroenterology) Ethyl Lonni BRAVO, MD (Inactive) as Consulting Physician (Otolaryngology) Ivin Kocher, MD as Consulting Physician (Dermatology) Brenna Adine CROME, DO as Consulting Physician (Pulmonary Disease)  This Provider for this visit: Treatment Team:  Attending Provider: Geronimo Amel, MD    07/03/2022 -transfer of care from Dr. Adine Icard to the pulm fibrosis center Dr. Geronimo Chief Complaint  Patient presents with   Follow-up    Pt states she has been doing okay since last visit and denies any complaints.     HPI Chalonda Schlatter 78 y.o. -pleasant female has been getting annual low-dose CT scans since 2018 annually.  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  Therefore she has been referred here.  According to the patient she is quite asymptomatic.  She barely has any symptoms and she feels fine.  She did have some cough in November 2022 and this is actually cleared up.  Current symptom severity is listed below   [facility] Comprehensive ILD Questionnaire  Symptoms:  Very mild    Past Medical History :  -She believes she might have some COPD.  Denies any asthma connective tissue disease.  Denies any stroke seizures hepatitis pulmonary hypertension.  Denies kidney disease.  Denies heart disease denies pleurisy. -She had COVID-vaccine but not the COVID   ROS:  8 pound intentional weight loss but otherwise negative  FAMILY HISTORY of LUNG DISEASE:  -She has an interesting family history.  Her  maternal grandmother was the cousin of any Renie  the [employer] ([email or web address]).  Her father was himself born in [birth year].  She was born in [birth year].  There is no family history of any lung disease.  PERSONAL EXPOSURE HISTORY:  -She started smoking in 1966.  She still smokes cigarettes.  In between she quit for 9 years.  No cigar smoking.  No marijuana smoking no cocaine no intravenous drug use  HOME  EXPOSURE and HOBBY DETAILS :  Single-family home for the last 29 years.  Age of the home is 50 years.  She does some gardening but otherwise detailed organic antigen exposure history in the house is negative  OCCUPATIONAL HISTORY (122 questions) : She is done some foot care work but otherwise detailed organic and inorganic antigen exposure history is negative  PULMONARY TOXICITY HISTORY (27 items):  She has been on prednisone  in the past  INVESTIGATIONS:  Personally visualized all the CT scans and showed it to her agree with current thinking of alternative to UIP diagnosis.  annual LDCT 2018, 2019, 2020 and 2021 July and 2022 November . ILD first repored in July 2021   and this the LDCT reported ILD as progressive though prior scan no mention of ILD. In my professional opinion: some ILD was there in 2019:. HRCT then done Nov 2022 - reported as prob UI and the May 2023 HRCT reported as altermnative dx. AGree with alternative diagnsos    CT Chest data - HRCT May 2023  Narrative & Impression  CLINICAL DATA:  Follow-up right upper lobe pulmonary nodule   EXAM: CT CHEST WITHOUT CONTRAST   TECHNIQUE: Multidetector CT imaging of the chest was performed following the standard protocol without intravenous contrast. High resolution imaging of the lungs, as well as inspiratory and expiratory imaging, was performed.   RADIATION DOSE REDUCTION: This exam was performed according to the departmental dose-optimization program which includes automated exposure control,  adjustment of the mA and/or kV according to patient size and/or use of iterative reconstruction technique.   COMPARISON:  11/02/2021   FINDINGS: Cardiovascular: Aortic atherosclerosis. Normal heart size. Left and right coronary artery calcifications. No pericardial effusion.   Mediastinum/Nodes: No enlarged mediastinal, hilar, or axillary lymph nodes. Thyroid  gland, trachea, and esophagus demonstrate no significant findings.   Lungs/Pleura: Minimal paraseptal emphysema. Subpleural nodule of the anterior right upper lobe new on prior examination is diminished in fullness and conspicuity, with an irregular remnant in this vicinity (series 13, image 109). Additional small pulmonary nodules are unchanged. Unchanged mild pulmonary fibrosis in a pattern with apical to basal gradient featuring irregular peripheral interstitial opacity, ground-glass, and varicoid bronchiectasis without evidence of subpleural bronchiolectasis or honeycombing. No significant air trapping on expiratory phase imaging. No pleural effusion or pneumothorax.   Upper Abdomen: No acute abnormality.   Musculoskeletal: No chest wall abnormality. No suspicious osseous lesions identified.   IMPRESSION: 1. Subpleural nodule of the anterior right upper lobe new on prior examination is diminished in fullness and conspicuity, with an irregular remnant in this vicinity. Findings are consistent nonspecific sequelae of infection or inflammation, for which no further specific follow-up is required. Additional small pulmonary nodules are unchanged. Lung RADS category 2, benign appearance  or behavior. Consider ongoing low-dose CT lung cancer screening if indicated by patient age, smoking history, and/or other risk factors for lung cancer. 2. Unchanged mild pulmonary fibrosis in a pattern with apical to basal gradient featuring irregular peripheral interstitial opacity, ground-glass, and varicoid bronchiectasis without  evidence of subpleural bronchiolectasis or honeycombing. Findings are suggestive of an alternative diagnosis (not UIP) per consensus guidelines: Diagnosis of Idiopathic Pulmonary Fibrosis: An Official ATS/ERS/JRS/ALAT Clinical Practice Guideline. Am JINNY [employer] Crit Care Med Vol 198, Iss 5, [phone], Aug 29 2017. 3. Minimal emphysema. 4. Coronary artery disease.   Aortic Atherosclerosis (ICD10-I70.0) and Emphysema (ICD10-J43.9).     Electronically Signed   By: Marolyn JONETTA Jaksch M.D.   On: 05/12/2022 15:42        PFT  OV 10/01/2022  Subjective:  Patient ID: Camie Levorn Budge, female , DOB: 1946-02-16 , age 21 y.o. , MRN: [id number] , ADDRESS: [address] [place] [address] PCP Tonita Fallow, MD Patient Care Team: Tonita Fallow, MD as PCP - General (Internal Medicine) Teressa Toribio SQUIBB, MD as Attending Physician (Gastroenterology) Jakie Alm SAUNDERS, MD as Consulting Physician (Gastroenterology) Ethyl Lonni BRAVO, MD (Inactive) as Consulting Physician (Otolaryngology) Ivin Kocher, MD as Consulting Physician (Dermatology) Brenna Adine CROME, DO as Consulting Physician (Pulmonary Disease)  This Provider for this visit: Treatment Team:  Attending Provider: Geronimo Amel, MD    10/01/2022 -   Chief Complaint  Patient presents with   Follow-up    Pt recently had a procedure performed by Dr. Kerrin.  Pt states she has been doing okay since last visit. States has some soreness from the procedure that was done.     HPI Terea Neubauer 78 y.o. -presents with her daughter Rock Hummer who is a Optician, dispensing.  Rock used to work for [employer] and also [employer].  Patient CT scan had alternative diagnosis to UIP.  Therefore she underwent surgical lung biopsy on 08/15/2022.  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  We discussed several aspects of IPF mainly that is progressive disease with significant variability.   Overall she is stable.  Other than the soreness from the surgery she is fine.  We discussed 3 options of nintedanib versus pirfenidone  versus participating in clinical trial with a modified form of pirfenidone .  The summary of this is detailed below.  She absolutely does not want area side effect.  Therefore she not interested in nintedanib.  She is known to have diverticulosis and is concerned this might provoke diverticulitis therefore even more caution against nintedanib.  We discussed pirfenidone  with key elements below.  We discussed the [facility] trial along with the concept of clinical trials as a care option.  She is taken a copy of the consent to reflect on what would be the appropriate course for her which would either be take standard of care pirfenidone  or participate in the clinical trial.   She quit smoking 09/15/22         PFT  SLB [id number]  SURGICAL PATHOLOGY  CASE: [id number]  PATIENT: Ling Ingraham  Surgical Pathology Report      Clinical History: Interstitial lung disease (nt)      FINAL MICROSCOPIC DIAGNOSIS:   A. 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 LUNG, RIGHT UPPER LOBE, RESECTION:  - Minimal to mild interstitial fibrosis with elastosis (see  comment)   C. 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 There is evidence of architectural distortion with  peribronchiolar and smooth muscle metaplasia. Multiple fibroblastic foci  are seen along with focal microscopic honeycombing. Granulomas or  increased  eosinophils are not seen. Overall, the distribution and nature  of the interstitial fibrosis in this case are most suggestive of usual  interstitial pneumonia (UIP) pattern. UIP pattern may be seen with  collagen vascular diseases, drug reactions, or as an idiopathic disease  (idiopathic pulmonary fibrosis). Scattered lymphoid aggregates within  the affected areas may favor a collagen vascular disease. Clinical and  radiographic correlation is necessary.    OV 01/19/2023  Subjective:  Patient ID: Witney Huie, female , DOB: 1946-08-24 , age 47 y.o. , MRN: [id number] , ADDRESS: [address] [place] [address] PCP Tonita Fallow, MD Patient Care Team: Tonita Fallow, MD as PCP - General (Internal Medicine) Teressa Toribio SQUIBB, MD as Attending Physician (Gastroenterology) Jakie Alm SAUNDERS, MD as Consulting Physician (Gastroenterology) Ethyl Lonni BRAVO, MD (Inactive) as Consulting Physician (Otolaryngology) Ivin Kocher, MD as Consulting Physician (Dermatology) Brenna Adine CROME, DO as Consulting Physician (Pulmonary Disease)  This Provider for this visit: Treatment Team:  Attending Provider: Geronimo Amel, MD   01/19/2023 -   Chief Complaint  Patient presents with   Follow-up    Follow up for IPF. Pt is still continuing the pirfenidone  three times a day with no issues noted at this time.      HPI Svara Twyman 78 y.o. -returns for follow-up.  Last seen in the fall 2023.  She continues to do well.  She says symptoms are stable.  Not much shortness of breath.  She is not on oxygen.  She is tolerating pirfenidone  really well except for slight diminished appetite but no weight loss.  No nausea no vomiting no diarrhea no GI upset no fatigue no skin rash.  She is relapsed with her smoking.  She is smoking only few cigarettes though.  She says she is trying to cut down.  Last pulmonary function test and high-resolution CT chest was in May 2023.  Her last  set of labs was in October 2023.  I reviewed this.  She needs another set of labs.  Of note she still has some incisional pain from lung biopsy.  It is very mild and tolerable she does not want medicines.  She has upcoming appointment Dr. Kerrin.  We discussed participation in DEXA bone scan research protocol.  She is interested in this.  Pulmonary fibrosis patients might be at high risk for osteoporosis.  She has not had a bone scan in some years.  Given a copy of the consent form.  I also emailed the daughter the consent form.  The daughter is not here with her today.  Her daughter Rock is now working at [employer] long as a Engineer, civil (consulting).  She is pretty excited about that.      Modified Six Minute Walk - 01/19/23 1000     Type of O2 used  Room Air    Number of laps completed  3    Lap Pace Moderate    Resting Heartrate 80 bpm    Final  Heartrate 92 bpm    Resting Pulse Ox 100 %    Desaturated to <= 3 points No    Desaturated to < 88% No    Became tachycardic No    Symptoms  no symptoms noted while walking 3 laps    Was the O2 correction test done? No    comments lab1: 98%/92, lap2:100%/94, lap3:100%/95 and after: 100%/92           OV 05/21/2023  Subjective:  Patient ID: Yasmyn Bellisario, female , DOB: [birth year] , age 66 y.o. , MRN: [id number] , ADDRESS: [address] [place] [address] PCP Tonita Fallow, MD Patient Care Team: Tonita Fallow, MD as PCP - General (Internal Medicine) Teressa Toribio SQUIBB, MD as Attending Physician (Gastroenterology) Jakie Alm SAUNDERS, MD as Consulting Physician (Gastroenterology) Ethyl Lonni BRAVO, MD (Inactive) as Consulting Physician (Otolaryngology) Ivin Kocher, MD as Consulting Physician (Dermatology) Brenna Adine CROME, DO as Consulting Physician (Pulmonary Disease)  This Provider for this visit: Treatment Team:  Attending Provider: Geronimo Amel, MD    05/21/2023 -   Chief Complaint  Patient presents with    Follow-up    F/up on PFT and CT scan     HPI Teal Raben 78 y.o. -returns for follow-up.  She continues to feel stable from a dyspnea standpoint.  Her liver function test in May 2024 was normal.  Her symptom score below shows stability.  She had pulmonary function test and high-resolution CT scan of the chest and these are stable.  In terms of pirfenidone : She feels she is tolerating it well.  She gets a little fatigue after taking it but then it is transient and resolves.  But just as I began to examine her she told me that she has got rash on her bilateral forearms.  She said it happened 4 weeks ago while she was doing some bug spray in the outdoors.  She was sun exposed in her forearms.  There is no itching or burning.  It is punctate rash.  She thinks that that it is a bug bite.  But it has not resolved.  I did explain to her that pirfenidone  can cause sun exposed rash.  She does not apply sunscreen because she rarely goes outside.  I did remind her that she needs to wear sunscreen even if she goes out for 5 minutes.  We took a shared decision making that she will stop pirfenidone  for 2 weeks and then restart and monitor if the rash goes away.  Lung cancer screening: Recent CT scan of the chest May 2024 high-resolution CT chest for pulmonary fibrosis did not show any evidence of lung cancer  Smoking: We discussed quitting smoking.  She said that she quit for 9 years while living in [place]  but when her kids became teenagers and she relapsed into smoking.  She is now smoking 10 cigarettes/day.  We discussed Chantix  and Zyban she does not want to do this.  She says she will try willpower..  She smokes with the left hand she smokes outdoors.  I advised her to cut down by 1 cigarette/week.  Also try using her right hand and try different places to smoke. she is going to try some of the strategies. Tital time spent on this >=  Social: Her daughter Rock had some breast tumors  removed fortunately they were benign.  But she also tore her ankle ligaments and is in a brace.   OV 11/10/2023  Subjective:  Patient ID: Marigold Mom, female , DOB: 23-Jun-1946 , age 71 y.o. , MRN: [id number] , ADDRESS: [address] [place] [address] PCP Tonita Fallow, MD Patient Care Team: Tonita Fallow, MD as PCP - General (Internal Medicine) Teressa Toribio SQUIBB, MD as Attending Physician (Gastroenterology) Jakie Alm SAUNDERS, MD as Consulting Physician (Gastroenterology) Ethyl Lonni BRAVO, MD (Inactive) as Consulting Physician (Otolaryngology) Ivin Kocher, MD as Consulting Physician (Dermatology) Brenna Adine CROME, DO as Consulting Physician (Pulmonary Disease)  This Provider for this visit: Treatment Team:  Attending Provider: Geronimo Amel, MD     11/10/2023 -   Chief Complaint  Patient presents with   Follow-up    PFT's. Breathing has been stable and she is not coughing much.      HPI Elaina Cara 78 y.o. -presents with daughter Rock for follow-up.  Rock herself had an ankle tear and she is recovering.  Patient herself in the interim while walking in the science center on 10/26/2023 tripped and fell and fractured the right kneecap.  She had surgery on 10/30/2023 and is recovering.  Chart review done and external record.  And diagnosis of right patella fracture confirmed and she had open reduction internal fixation.  She has a brace on Dr. Rock is worried about DVT risk and is concerned the patient may not be applying her TED stockings correctly patient is brought in wheelchair today because of the right knee issue.  Rock is an independent historian today.  IPF: Feels stable.  Pulmonary function test are stable.  Last CT was in spring 2024.  And then also in July 2024.  According to the radiologist there is no progression.  Personally visualized it and I do agree with it.  Therapeutic monitoring with pirfenidone : Liver function test  normal October 2024 not much side effects at all.  Tolerating it well as a low-dose protocol.  She did have a skin rash last time but it appears this is because of Crestor .  She is now on Crestor  only 3 times a week and the skin rash is warm.  Smoking: She continues to smoke.  After the fall she quit smoking on 10/30/2023.  Asking for nicotine  patch.  Advised to get it over-the-counter.     OV 08/23/2024  Subjective:  Patient ID: Naomie Crow, female , DOB: 1946/04/10 , age 40 y.o. , MRN: [id number] , ADDRESS: [address] [place] [address] PCP Aletha Bene, MD Patient Care Team: Aletha Bene, MD as PCP - General (Family Medicine) Teressa Toribio SQUIBB, MD (Inactive) as Attending Physician (Gastroenterology) Jakie Alm SAUNDERS, MD as Consulting Physician (Gastroenterology) Ethyl Lonni BRAVO, MD (Inactive) as Consulting Physician (Otolaryngology) Ivin Kocher, MD as Consulting Physician (Dermatology) Brenna Adine CROME, DO as Consulting Physician (Pulmonary Disease)  This Provider for this visit: Treatment Team:  Attending Provider: Geronimo Amel, MD    08/23/2024 -   Chief Complaint  Patient presents with   Medical Management of Chronic Issues   Interstitial Lung Disease    Breathing has been overall stable. She has occ non prod cough.      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  - PFt May 2024 /NOv 2024  -Started pirfenidone  towards the end of 2023 -low-dose protocol.  - Slowly progressive as of high-resolution CT chest May 2025 2025  Mild associated emphysema   Coronary artery calcification -seen cardiology  Dr. Jeffrie Jeffrie in 2022   Quit smoking 2023-relapsed as of January 2024. Lung cancer screening  - No evidence of lung cancer on CT chest June 2025  Currently Esbriet /Pirfenidone  requires intensive drug  monitoring due to high concerns for Adverse effects of , including  Drug Induced Liver Injury, significant GI side effects that include but not limited to Diarrhea, Nausea, Vomiting,  and other system side effects that include Fatigue, headaches, weight loss and other side effects such as skin rash. These will be monitored with  blood work such as LFT initially once a month for 6 months and then quarterly  #Screening for pulmonary hypertension  - Normal echo June 2025  HPI Nelsy Madonna 78 y.o. -returns for follow-up.  Since I last saw her on her birthday on June 17, 2026 she ended up with intraparenchymal hemorrhage.  Course was complicated by hospital delirium.  She has been discharged.  At this point in time she is back to baseline almost.  There are some mild short-term memory deficit but the daughter thinks it is almost to baseline.  Be going to see a neuro-ophthalmologist to make sure she can drive.  She is doing her ADLs.  And otherwise doing well.  Patient also endorses the same.  The daughter is independent historian here  In terms of IPF: She feels stable: She did have pulmonary function test but the DLCO is declined.  Last CT scan personally visualized was done May 2025 and a slight progression compared to 2023.  The DLCO also reflects that.  But she is feeling good.  Esbriet : She is tolerating this at low-dose.  Liver function test is normal  Smoking: She is again trying to quit she met with primary care physician and she started on Chantix .  She is aware about the risk of progression of ILD with smoking and also increased stroke risk cardiac risk etc.      SYMPTOM SCALE - ILD 07/03/2022 10/01/2022  05/21/2023  139# 11/10/2023 136# 08/23/2024 134# Low dose esbreit Reducing smoking Chantix  +  Current weight       O2 use ra ra ra ra ra  Shortness of Breath 0 -> 5 scale with 5 being worst (score 6 If unable to do)   0   At rest 0 0 0 0 0  Simple tasks - showers, clothes change,  eating, shaving 1 1 0 1/5 0  Household (dishes, doing bed, laundry) 0 2 1 3 1   Shopping 0 2 0 2 0  Walking level at own pace 0 3 0 1 0  Walking up Stairs 0 1 1 0 1  Total (30-36) Dyspnea Score 1 9 2 8 2       Non-dyspnea symptoms (0-> 5 scale) 07/03/2022 10/01/2022  05/21/2023   11/10/2023  08/23/2024   How bad is your cough? 1 0 0 1 0  How bad is your fatigue 2 2 2 3 1   How bad is nausea 2 0 0 0 0  How bad is vomiting?  0 0 0 0 0  How bad is diarrhea? 0 00 0 0 0  How bad is anxiety? 0 0 0 1 0  How bad is depression 0 0 0 0 0  Any chronic pain - if so where and how bad 0 0 x  0   Simple office walk 185 feet x  3 laps goal with forehead probe 07/03/2022  11/10/2023 Rt knee issue and on wheel cahir  O2 used ra  Number laps completed 3   Comments about pace avg   Resting Pulse Ox/HR 98% and 69/min   Final Pulse Ox/HR 96% and 96/min   Desaturated </= 88% no   Desaturated <= 3% points no   Got Tachycardic >/= 90/min yes   Symptoms at end of test No complaints   Miscellaneous comments x      PFT     Latest Ref Rng & Units 08/23/2024    1:40 PM 11/10/2023    8:42 AM 05/19/2023    3:28 PM 05/07/2022    1:36 PM  PFT Results  FVC-Pre L 2.51  P 2.48  2.26  1.97   FVC-Predicted Pre % 105  P 98  88  75   FVC-Post L    2.02   FVC-Predicted Post %    77   Pre FEV1/FVC % % 78  P 80  72  82   Post FEV1/FCV % %    77   FEV1-Pre L 1.96  P 1.98  1.63  1.62   FEV1-Predicted Pre % 111  P 105  85  83   FEV1-Post L    1.55   DLCO uncorrected ml/min/mmHg 10.80  P 14.88  12.06  12.18   DLCO UNC% % 61  P 81  65  66   DLCO corrected ml/min/mmHg 10.91  P 14.46  12.10  11.74   DLCO COR %Predicted % 62  P 79  66  63   DLVA Predicted % 74  P 90  88  87   TLC L    4.74   TLC % Predicted %    96   RV % Predicted %    118     P Preliminary result       LAB RESULTS last 96 hours No results found.       has a past medical history of Allergy (06/08/24), Anxiety, Atherosclerosis,  Cataract (08/01/24), CKD (chronic kidney disease) stage 3, GFR 30-59 ml/min (HCC) (12/24/2020), Colon polyps, COPD (chronic obstructive pulmonary disease) (HCC), Costochondritis, Elevated blood pressure reading, Emphysema of lung (HCC), GERD (gastroesophageal reflux disease), Hyperlipidemia, Hypertension, Insomnia, Osteoporosis, and Pre-diabetes.   reports that she has been smoking cigarettes. She started smoking about 59 years ago. She has a 57.6 pack-year smoking history. She has never used smokeless tobacco.  Past Surgical History:  Procedure Laterality Date   ABDOMINAL HYSTERECTOMY     COLONOSCOPY  09/10/2011   hysterectomy     INTERCOSTAL NERVE BLOCK Right 08/15/2022   Procedure: INTERCOSTAL NERVE BLOCK;  Surgeon: Kerrin Elspeth BROCKS, MD;  Location: [facility] OR;  Service: Thoracic;  Laterality: Right;   LUNG BIOPSY Right 08/15/2022   Procedure: LUNG BIOPSY;  Surgeon: Kerrin Elspeth BROCKS, MD;  Location: [facility] OR;  Service: Thoracic;  Laterality: Right;   ORIF PATELLA Right 10/30/2023   Procedure: OPEN REDUCTION INTERNAL FIXATION (ORIF) PATELLA;  Surgeon: Kay Kemps, MD;  Location: [facility] ORS;  Service: Orthopedics;  Laterality: Right;  90   POLYPECTOMY     UVULECTOMY N/A 09/09/2016   Procedure: PARTIAL UVULECTOMY, direct laryngoscopy;  Surgeon: Lonni FORBES Angle, MD;  Location: [facility];  Service: ENT;  Laterality: N/A;  PARTIAL UVULECTOMY, direct laryngoscopy    Allergies  Allergen Reactions   Iodinated Contrast Media Other (See Comments)    Pt states she gets viscous when contrast is administered.    Omnipaque  [Iohexol ] Other (See Comments)    EXTREME AGITATION   Fish Oil Diarrhea  Spiriva  Respimat [Tiotropium Bromide Monohydrate ] Cough    Gaging and excessive phlegm    Immunization History  Administered Date(s) Administered   Fluad Quad(high Dose 65+) 09/25/2022   INFLUENZA, HIGH DOSE SEASONAL PF 08/29/2013, 10/25/2015, 11/24/2017, 11/29/2018, 10/15/2021,  10/22/2023   Influenza,inj,Quad PF,6+ Mos 10/31/2022   PFIZER(Purple Top)SARS-COV-2 Vaccination 02/02/2020, 02/28/2020   Pneumococcal Conjugate-13 02/02/2015   Pneumococcal Polysaccharide-23 03/29/2010   Tdap 02/26/2009, 10/31/2022   Zoster, Live 12/29/2008    Family History  Problem Relation Age of Onset   Heart disease Mother    Heart attack Mother    Heart disease Father    Diabetes Father    Heart attack Father    Cancer Brother        Lung   Bipolar disorder Daughter    Colon cancer Neg Hx    Esophageal cancer Neg Hx    Stomach cancer Neg Hx    Rectal cancer Neg Hx      Current Outpatient Medications:    acetaminophen  (TYLENOL ) 500 MG tablet, Take 1,000 mg by mouth daily as needed for mild pain (pain score 1-3), moderate pain (pain score 4-6) or headache., Disp: , Rfl:    amLODipine  (NORVASC ) 10 MG tablet, Take 1 tablet (10 mg total) by mouth daily., Disp: 30 tablet, Rfl: 3   Calcium  Carb-Cholecalciferol  (CALCIUM  600 + D PO), Take 2 tablets by mouth daily., Disp: , Rfl:    cholecalciferol  (VITAMIN D3) 25 MCG (1000 UNIT) tablet, Take 5,000 Units by mouth daily., Disp: , Rfl:    ezetimibe  (ZETIA ) 10 MG tablet, TAKE 1 TABLET BY MOUTH DAILY FOR CHOLESTEROL, Disp: 90 tablet, Rfl: 3   Multiple Vitamin (MULTIVITAMIN PO), Take 1 tablet by mouth daily., Disp: , Rfl:    Pirfenidone  267 MG TABS, Take 2 tablets (534 mg total) by mouth with breakfast, with lunch, and with evening meal., Disp: 540 tablet, Rfl: 1   Psyllium (METAMUCIL FIBER PO), Take 1 tablet by mouth daily., Disp: , Rfl:    QUEtiapine  (SEROQUEL ) 50 MG tablet, Take 1 tablet (50 mg total) by mouth at bedtime., Disp: 90 tablet, Rfl: 1   rosuvastatin  (CRESTOR ) 20 MG tablet, Take 1/2 tablet (10 mg) 3 x / week for Cholesterol, Disp: 39 tablet, Rfl: 3   Varenicline  Tartrate, Starter, (CHANTIX  STARTING MONTH PAK) 0.5 MG X 11 & 1 MG X 42 TBPK, Starter pack, Disp: 53 each, Rfl: 0   varenicline  (CHANTIX ) 0.5 MG tablet, Take 1  tablet (0.5 mg total) by mouth 2 (two) times daily. (Patient not taking: Reported on 08/23/2024), Disp: 60 tablet, Rfl: 2      Objective:   Vitals:   08/23/24 1511  BP: 114/68  Pulse: 73  SpO2: 99%  Weight: 134 lb (60.8 kg)  Height: 5' 2 (1.575 m)    Estimated body mass index is 24.51 kg/m as calculated from the following:   Height as of this encounter: 5' 2 (1.575 m).   Weight as of this encounter: 134 lb (60.8 kg).  @WEIGHTCHANGE @  [employer]   08/23/24 1511  Weight: 134 lb (60.8 kg)     Physical Exam   General: No distress. Looks better/well O2 at rest: no Cane present: YES Sitting in wheel chair: no Frail: no Obese: no Neuro: Alert and Oriented x 3. GCS 15. Speech normal Psych: Pleasant Resp:  Barrel Chest - no.  Wheeze - no, Crackles -yes base, No overt respiratory distress CVS: Normal heart sounds. Murmurs - no Ext: Stigmata of Connective Tissue Disease -  no HEENT: Normal upper airway. PEERL +. No post nasal drip        Assessment/     Assessment & Plan IPF (idiopathic pulmonary fibrosis) (HCC)  Encounter for therapeutic drug monitoring  Tobacco use disorder  Screening for lung cancer    PLAN Patient Instructions  IPF (idiopathic pulmonary fibrosis) (HCC) Encounter for therapeutic drug monitoring   -Pulmonary fibrosis is slowly progressive since 2023 > Aug 2025 -Liver function test normal on pirfenidone   Oct 2024 and Aug 2025 -Overall tolerating pirfenidone  well; LOW Dose protocol   Plan -Cotinue pirfenidone   LOW dose protocol  2 pills 3 times daily with food but you can try without food but overall do not recommed thatg -Reminder for you to apply sunscreen to the skin and sun exposed areas even when you go out for 5 minutes  - -Check liver function test in 3 months  - spiro/dlco in 4- 5 months - evaluate role for new drug Nerandromilast at followup   Smoker Lung cancer screening  -Still ongoing problem with smoking but  recently taken efforts to reduce and dong chantix  via PCP Aletha Bene, MD  = Congratulation - No evidence of lung cancer on recent CT scan chest May 2024 and May [address]774  Plan  -Chantix  via PCP  -Capture any lung cancer screening information on future CT scan of the chest for pulmonary fibrosis  - aim summer 2026  Recent stroke June 2025  - glad near neuro intact as of 08/23/2024  Plan Per neuro   Follow-up - 30-minute visit with Dr. Geronimo in 4-5 months but after  PFT    -Symptom score and exercise hypoxemia test at follow-up    FOLLOWUP    Return in about 18 weeks (around 12/27/2024) for 30 min visit, after Spiro and DLCO, Face to Face Visit.    SIGNATURE    Dr. Dorethia Geronimo, M.D., F.C.C.P,  Pulmonary and Critical Care Medicine Staff Physician, [facility] Director - Interstitial Lung Disease  Program  Pulmonary Fibrosis [facility] at [facility] [place], [place], [address]  Pager: [phone], If no answer or between  15:00h - 7:00h: call [phone] Telephone: [phone]  4:01 PM 08/23/2024     HIGh Complexity  OFFICE   2021 E/M guidelines, first released in 2021, with minor revisions added in 2023. Must meet the requirements for 2 out of 3 dimensions to qualify.    Number and complexity of problems addressed Amount and/or complexity of data reviewed Risk of complications and/or morbidity  Severe exacerbation of chronic illness  Acute or chronic illnesses that may pose a threat to life or bodily function, e.g., multiple trauma, acute MI, pulmonary embolus, severe respiratory distress, progressive rheumatoid arthritis, psychiatric illness with potential threat to self or others, peritonitis, acute renal failure, abrupt change in neurological status Must meet the requirements for 2 of 3 of the categories)  Category 1: Tests and documents, historian  Any combination of 3 of the following:  Assessment  requiring an independent historian  Review of prior external note(s) from each unique source  Review of results of each unique test  Ordering of each unique test    Category 2: Interpretation of tests    Independent interpretation of a test performed by another physician/other qualified health care professional (not separately reported) ct  Category 3: Discuss management/tests  Discussion of management or test interpretation with external physician/other qualified health care professional/appropriate source (not  separately reported)  HIGH risk of morbidity from additional diagnostic testing or treatment Examples only:  Drug therapy requiring intensive monitoring for toxicity  Decision for elective major surgery with identified pateint or procedure risk factors  Decision regarding hospitalization or escalation of level of care  Decision for DNR or to de-escalate care   Parenteral controlled  substances

## 2024-08-23 NOTE — Progress Notes (Signed)
 Spirometry/diffusion capacity performed today.

## 2024-08-23 NOTE — Patient Instructions (Addendum)
 IPF (idiopathic pulmonary fibrosis) (HCC) Encounter for therapeutic drug monitoring   -Pulmonary fibrosis is slowly progressive since 2023 > Aug 2025 -Liver function test normal on pirfenidone   Oct 2024 and Aug 2025 -Overall tolerating pirfenidone  well; LOW Dose protocol   Plan -Cotinue pirfenidone   LOW dose protocol  2 pills 3 times daily with food but you can try without food but overall do not recommed thatg -Reminder for you to apply sunscreen to the skin and sun exposed areas even when you go out for 5 minutes  - -Check liver function test in 3 months  - spiro/dlco in 4- 5 months - evaluate role for new drug Nerandromilast at followup   Smoker Lung cancer screening  -Still ongoing problem with smoking but recently taken efforts to reduce and dong chantix  via PCP Aletha Bene, MD  = Congratulation - No evidence of lung cancer on recent CT scan chest May 2024 and May [address]  Plan  -Chantix  via PCP  -Capture any lung cancer screening information on future CT scan of the chest for pulmonary fibrosis  - aim summer 2026  Recent stroke June 2025  - glad near neuro intact as of 08/23/2024  Plan Per neuro   Follow-up - 30-minute visit with Dr. Geronimo in 4-5 months but after  PFT    -Symptom score and exercise hypoxemia test at follow-up

## 2024-08-23 NOTE — Patient Instructions (Addendum)
 PFT spiro/dlco performed today.

## 2024-08-24 ENCOUNTER — Ambulatory Visit
Admission: RE | Admit: 2024-08-24 | Discharge: 2024-08-24 | Disposition: A | Discharge status: Home / Self Care | Source: Ambulatory Visit | Attending: Family Medicine | Admitting: Family Medicine

## 2024-08-24 ENCOUNTER — Other Ambulatory Visit: Payer: Self-pay | Admitting: Cardiology

## 2024-08-24 DIAGNOSIS — Z1231 Encounter for screening mammogram for malignant neoplasm of breast: Secondary | ICD-10-CM | POA: Diagnosis not present

## 2024-08-25 NOTE — Telephone Encounter (Signed)
 Called left message on voicemail  ( patient's daughter Rock Hummer ). Asked for Rock  to contact office    Patient has not seen Dr Jeffrie since 01/03/21.   Patient 's primary has been refilling.  She will need have patient's provider fill medication  or make an apptto see Dr Jeffrie or an APP to continue start refilling medication ( Rosuvastatin  10 mg )    Any triage nurse can  take phone call

## 2024-08-25 NOTE — Telephone Encounter (Signed)
 Pt's pharmacy is requesting a refill on medication rosuvastatin . Pt has not been seen since 01/03/2021. Would Dr. Jeffrie like to refill this medication without pt beng seen? Please address

## 2024-08-26 NOTE — Telephone Encounter (Signed)
 Please obtain from PCP - Rafalla Aletha

## 2024-08-30 ENCOUNTER — Other Ambulatory Visit: Payer: Self-pay

## 2024-08-31 ENCOUNTER — Other Ambulatory Visit: Payer: Self-pay

## 2024-08-31 ENCOUNTER — Other Ambulatory Visit (HOSPITAL_COMMUNITY): Payer: Self-pay

## 2024-08-31 NOTE — Progress Notes (Signed)
 Specialty Pharmacy Refill Coordination Note  Spoke with Thompson,Linda (Daughter)  Alison Campbell is a 78 y.o. female contacted today regarding refills of specialty medication(s) Pirfenidone   Doses on hand: 69 tablets/Roughly 12 days  Patient requested: Delivery   Delivery date: 09/06/24   Verified address: [address] [place] [address]  Medication will be filled on 09/05/24.

## 2024-09-01 ENCOUNTER — Other Ambulatory Visit: Payer: Self-pay

## 2024-09-01 ENCOUNTER — Telehealth: Payer: Self-pay | Admitting: Neurology

## 2024-09-01 ENCOUNTER — Telehealth: Payer: Self-pay | Admitting: Family Medicine

## 2024-09-01 ENCOUNTER — Ambulatory Visit: Payer: Self-pay | Admitting: Neurology

## 2024-09-01 ENCOUNTER — Encounter: Payer: Self-pay | Admitting: Neurology

## 2024-09-01 VITALS — BP 128/74 | HR 80 | Ht 63.0 in | Wt 132.0 lb

## 2024-09-01 DIAGNOSIS — E785 Hyperlipidemia, unspecified: Secondary | ICD-10-CM

## 2024-09-01 DIAGNOSIS — I618 Other nontraumatic intracerebral hemorrhage: Secondary | ICD-10-CM | POA: Diagnosis not present

## 2024-09-01 DIAGNOSIS — Z79899 Other long term (current) drug therapy: Secondary | ICD-10-CM

## 2024-09-01 DIAGNOSIS — F419 Anxiety disorder, unspecified: Secondary | ICD-10-CM

## 2024-09-01 DIAGNOSIS — I639 Cerebral infarction, unspecified: Secondary | ICD-10-CM

## 2024-09-01 DIAGNOSIS — I611 Nontraumatic intracerebral hemorrhage in hemisphere, cortical: Secondary | ICD-10-CM

## 2024-09-01 MED ORDER — ROSUVASTATIN CALCIUM 20 MG PO TABS: ORAL_TABLET | ORAL | 1 refills | Status: DC

## 2024-09-01 MED ORDER — ALPRAZOLAM 0.5 MG PO TABS: 0.5000 mg | ORAL_TABLET | Freq: Every evening | ORAL | 0 refills | Status: AC | PRN

## 2024-09-01 NOTE — Telephone Encounter (Unsigned)
 Copied from CRM [id number]. Topic: Clinical - Medication Refill >> Sep 01, 2024 11:22 AM Willma R wrote: Medication: rosuvastatin  (CRESTOR ) 20 MG tablet  Has the patient contacted their pharmacy? Yes, call pcp  This is the patient's preferred pharmacy:  [id number] [place], [place] - [address] [place] [address] Phone: [phone] Fax: [phone]  Is this the correct pharmacy for this prescription? Yes If no, delete pharmacy and type the correct one.   Has the prescription been filled recently? No  Is the patient out of the medication? Yes  Has the patient been seen for an appointment in the last year OR does the patient have an upcoming appointment? Yes  Can we respond through MyChart? Yes  Agent: Please be advised that Rx refills may take up to 3 business days. We ask that you follow-up with your pharmacy

## 2024-09-01 NOTE — Progress Notes (Signed)
 Chief Complaint  Patient presents with   Hospitalization Follow-up    RM 15 stroke   ASSESSMENT AND PLAN  Alison Campbell is a 78 y.o. female   Right parietal intracranial hemorrhage  Aspirin  81 mg was on hold  Vascular risk factor of hypertension hyperlipidemia longtime smoker  Repeat MRI of the brain with without contrast, to rule out space-occupying lesion  Cognitive impairment  TSH, B12 to rule out treatable etiology,  Age-related, intracranial bleeding also contributed too.  Return to clinic with nurse practitioner in 6 months, if she is stable, may discharge to primary care physician,  DIAGNOSTIC DATA (LABS, IMAGING, TESTING) - I reviewed patient records, labs, notes, testing and imaging myself where available.   MEDICAL HISTORY:  Alison Campbell is a 78 year old female, accompanied by her daughter, seen in request by her primary care from [facility] Dr. Aletha, Rafaela to follow-up on her stroke, accompanied by her daughter Rock Schimke also her POA at today's visit September 01, 2024  History is obtained from the patient and review of electronic medical records. I personally reviewed pertinent available imaging films in PACS.   PMHx of  HTN Chronic insominia HLD Smoke used to 1ppd Pulmonary fibrosis,  COPD. Osteoporosis  Daughter is a Designer, jewellery, on the day of hospital admission June 07, 2024, patient was found to be confused during the conversation with her daughter, MRI of the brain showed right temporal intraparenchymal hemorrhage 5.2 cm  CT angiogram head and neck, degraded by motion, no hemodynamic significant stenosis CT venogram of the brain no thrombosis  Prior to hospital admission she was on aspirin  81 mg daily that was stopped, longtime smoker, 1 pack a day, now decreased to 1/4 pack a day  Echocardiogram ejection fraction 60 to 65%  LDL 53, A1c 5.6  She is now back home, already has some memory loss, made worse by her recent  intracranial hemorrhage, MoCA examination 26/30 today   PHYSICAL EXAM:   Vitals:   09/01/24 1108  BP: 128/74  Pulse: 80  SpO2: 98%  Weight: 132 lb (59.9 kg)  Height: 5' 3 (1.6 m)   Body mass index is 23.38 kg/m.  PHYSICAL EXAMNIATION:  Gen: NAD, conversant, well nourised, well groomed                     Cardiovascular: Regular rate rhythm, no peripheral edema, warm, nontender. Eyes: Conjunctivae clear without exudates or hemorrhage Neck: Supple, no carotid bruits. Pulmonary: Clear to auscultation bilaterally   NEUROLOGICAL EXAM:  MENTAL STATUS: Speech/cognition: Awake, alert, oriented to history taking and casual conversation    09/01/2024   11:00 AM  Montreal Cognitive Assessment   Visuospatial/ Executive (0/5) 4  Naming (0/3) 3  Attention: Read list of digits (0/2) 2  Attention: Read list of letters (0/1) 1  Attention: Serial 7 subtraction starting at 100 (0/3) 3  Language: Repeat phrase (0/2) 2  Language : Fluency (0/1) 1  Abstraction (0/2) 2  Delayed Recall (0/5) 2  Orientation (0/6) 6  Total 26    CRANIAL NERVES: CN II: Visual fields are full to confrontation. Pupils are round equal and briskly reactive to light. CN III, IV, VI: extraocular movement are normal. No ptosis. CN V: Facial sensation is intact to light touch CN VII: Face is symmetric with normal eye closure  CN VIII: Hearing is normal to causal conversation. CN IX, X: Phonation is normal. CN XI: Head turning and shoulder shrug are intact  MOTOR:  There is no pronator drift of out-stretched arms. Muscle bulk and tone are normal. Muscle strength is normal.  REFLEXES: Reflexes are 2+ and symmetric at the biceps, triceps, knees, and ankles. Plantar responses are flexor.  SENSORY: Intact to light touch, pinprick and vibratory sensation are intact in fingers and toes.  COORDINATION: There is no trunk or limb dysmetria noted.  GAIT/STANCE: Posture is normal. Gait is steady   REVIEW OF  SYSTEMS:  Full 14 system review of systems performed and notable only for as above All other review of systems were negative.   ALLERGIES: Allergies  Allergen Reactions   Iodinated Contrast Media Other (See Comments)    Pt states she gets viscous when contrast is administered.    Omnipaque  [Iohexol ] Other (See Comments)    EXTREME AGITATION   Fish Oil Diarrhea   Spiriva  Respimat [Tiotropium Bromide Monohydrate ] Cough    Gaging and excessive phlegm    HOME MEDICATIONS: Current Outpatient Medications  Medication Sig Dispense Refill   acetaminophen  (TYLENOL ) 500 MG tablet Take 1,000 mg by mouth daily as needed for mild pain (pain score 1-3), moderate pain (pain score 4-6) or headache.     amLODipine  (NORVASC ) 10 MG tablet Take 1 tablet (10 mg total) by mouth daily. 30 tablet 3   Calcium  Carb-Cholecalciferol  (CALCIUM  600 + D PO) Take 2 tablets by mouth daily.     cholecalciferol  (VITAMIN D3) 25 MCG (1000 UNIT) tablet Take 5,000 Units by mouth daily.     ezetimibe  (ZETIA ) 10 MG tablet TAKE 1 TABLET BY MOUTH DAILY FOR CHOLESTEROL 90 tablet 3   Multiple Vitamin (MULTIVITAMIN PO) Take 1 tablet by mouth daily.     Pirfenidone  267 MG TABS Take 2 tablets (534 mg total) by mouth with breakfast, with lunch, and with evening meal. 540 tablet 1   Psyllium (METAMUCIL FIBER PO) Take 1 tablet by mouth daily.     QUEtiapine  (SEROQUEL ) 50 MG tablet Take 1 tablet (50 mg total) by mouth at bedtime. 90 tablet 1   rosuvastatin  (CRESTOR ) 20 MG tablet Take 1/2 tablet (10 mg) 3 x / week for Cholesterol 39 tablet 3   varenicline  (CHANTIX ) 0.5 MG tablet Take 1 tablet (0.5 mg total) by mouth 2 (two) times daily. 60 tablet 2   Varenicline  Tartrate, Starter, (CHANTIX  STARTING MONTH PAK) 0.5 MG X 11 & 1 MG X 42 TBPK Starter pack 53 each 0   No current facility-administered medications for this visit.    PAST MEDICAL HISTORY: Past Medical History:  Diagnosis Date   Allergy 06/08/24   Iohexol  and iodinated  contrast dyes   Anxiety    Atherosclerosis    Cataract 08/01/24   CKD (chronic kidney disease) stage 3, GFR 30-59 ml/min (HCC) 12/24/2020   Colon polyps    COPD (chronic obstructive pulmonary disease) (HCC)    pulmonary fibrosis   Costochondritis    Elevated blood pressure reading    no meds   Emphysema of lung (HCC)    GERD (gastroesophageal reflux disease)    coffee related   Hyperlipidemia    Hypertension    Insomnia    Osteoporosis    Pre-diabetes     PAST SURGICAL HISTORY: Past Surgical History:  Procedure Laterality Date   ABDOMINAL HYSTERECTOMY     COLONOSCOPY  09/10/2011   hysterectomy     INTERCOSTAL NERVE BLOCK Right 08/15/2022   Procedure: INTERCOSTAL NERVE BLOCK;  Surgeon: Kerrin Elspeth BROCKS, MD;  Location: [facility] OR;  Service: Thoracic;  Laterality: Right;  LUNG BIOPSY Right 08/15/2022   Procedure: LUNG BIOPSY;  Surgeon: Kerrin Elspeth BROCKS, MD;  [facility];  Service: Thoracic;  Laterality: Right;   ORIF PATELLA Right 10/30/2023   Procedure: OPEN REDUCTION INTERNAL FIXATION (ORIF) PATELLA;  Surgeon: Kay Kemps, MD;  Location: [facility]S;  Service: Orthopedics;  Laterality: Right;  90   POLYPECTOMY     UVULECTOMY N/A 09/09/2016   Procedure: PARTIAL UVULECTOMY, direct laryngoscopy;  Surgeon: Lonni FORBES Angle, MD;  Location: [facility];  Service: ENT;  Laterality: N/A;  PARTIAL UVULECTOMY, direct laryngoscopy    FAMILY HISTORY: Family History  Problem Relation Age of Onset   Heart disease Mother    Heart attack Mother    Heart disease Father    Diabetes Father    Heart attack Father    Cancer Brother        Lung   Bipolar disorder Daughter    Colon cancer Neg Hx    Esophageal cancer Neg Hx    Stomach cancer Neg Hx    Rectal cancer Neg Hx     SOCIAL HISTORY: Social History   Socioeconomic History   Marital status: Widowed    Spouse name: Not on file   Number of children: Not on file   Years of education: Not on file    Highest education level: 12th grade  Occupational History   Not on file  Tobacco Use   Smoking status: Every Day    Current packs/day: 0.00    Average packs/day: 1 pack/day for 57.6 years (57.6 ttl pk-yrs)    Types: Cigarettes    Start date: 1966    Last attempt to quit: 08/15/2022    Years since quitting: 2.0   Smokeless tobacco: Not on file  Vaping Use   Vaping status: Never Used  Substance and Sexual Activity   Alcohol use: No    Alcohol/week: 0.0 standard drinks of alcohol   Drug use: No   Sexual activity: Not Currently  Other Topics Concern   Not on file  Social History Narrative   Right handed      Social Drivers of Health   Financial Resource Strain: Low Risk  (08/09/2024)   Overall Financial Resource Strain (CARDIA)    Difficulty of Paying Living Expenses: Not hard at all  Food Insecurity: No Food Insecurity (08/09/2024)   Hunger Vital Sign    Worried About Running Out of Food in the Last Year: Never true    Ran Out of Food in the Last Year: Never true  Transportation Needs: No Transportation Needs (08/09/2024)   PRAPARE - Administrator, Civil Service (Medical): No    Lack of Transportation (Non-Medical): No  Physical Activity: Inactive (08/09/2024)   Exercise Vital Sign    Days of Exercise per Week: 0 days    Minutes of Exercise per Session: Not on file  Stress: No Stress Concern Present (08/09/2024)   [employer] of Occupational Health - Occupational Stress Questionnaire    Feeling of Stress: Only a little  Social Connections: Moderately Integrated (08/09/2024)   Social Connection and Isolation Panel    Frequency of Communication with Friends and Family: More than three times a week    Frequency of Social Gatherings with Friends and Family: Once a week    Attends Religious Services: 1 to 4 times per year    Active Member of [employer] or Organizations: Yes    Attends Banker Meetings: 1 to 4 times per year  Marital Status: Widowed   Intimate Partner Violence: Patient Unable To Answer (06/07/2024)   Humiliation, Afraid, Rape, and Kick questionnaire    Fear of Current or Ex-Partner: Patient unable to answer    Emotionally Abused: Patient unable to answer    Physically Abused: Patient unable to answer    Sexually Abused: Patient unable to answer      Modena Callander, M.D. Ph.D.  [facility] [address], [address] [place], [place] [address] Ph: [phone] Fax: [phone]  CC:  Waddell Karna LABOR, NP [address] [place],  [place] [address]  Aletha Bene, MD

## 2024-09-01 NOTE — Telephone Encounter (Signed)
 no auth required sent to [facility] [phone]

## 2024-09-02 ENCOUNTER — Ambulatory Visit: Payer: Self-pay | Admitting: Neurology

## 2024-09-02 LAB — VITAMIN B12: Vitamin B-12: 460 pg/mL (ref 232–1245)

## 2024-09-02 LAB — TSH: TSH: 1.05 u[IU]/mL (ref 0.450–4.500)

## 2024-09-03 ENCOUNTER — Encounter: Payer: Self-pay | Admitting: Neurology

## 2024-09-05 ENCOUNTER — Other Ambulatory Visit: Payer: Self-pay

## 2024-09-05 ENCOUNTER — Other Ambulatory Visit (HOSPITAL_COMMUNITY): Payer: Self-pay

## 2024-09-05 ENCOUNTER — Telehealth: Payer: Self-pay | Admitting: Neurology

## 2024-09-05 DIAGNOSIS — Z79899 Other long term (current) drug therapy: Secondary | ICD-10-CM

## 2024-09-05 DIAGNOSIS — E785 Hyperlipidemia, unspecified: Secondary | ICD-10-CM

## 2024-09-05 DIAGNOSIS — I639 Cerebral infarction, unspecified: Secondary | ICD-10-CM

## 2024-09-05 MED ORDER — ROSUVASTATIN CALCIUM 10 MG PO TABS: 10.0000 mg | ORAL_TABLET | Freq: Every day | ORAL | 3 refills | Status: AC

## 2024-09-05 NOTE — Telephone Encounter (Signed)
 Sent MYCHART, ok to flight

## 2024-09-05 NOTE — Telephone Encounter (Signed)
 Pt called to follow up with MD to see if its okay for PT to fly. Pt is planning trip , just wanting to follow up .

## 2024-09-19 ENCOUNTER — Ambulatory Visit: Admitting: Family Medicine

## 2024-09-28 ENCOUNTER — Other Ambulatory Visit: Payer: Self-pay | Admitting: Pharmacy Technician

## 2024-09-28 ENCOUNTER — Encounter (INDEPENDENT_AMBULATORY_CARE_PROVIDER_SITE_OTHER): Payer: Self-pay

## 2024-09-28 ENCOUNTER — Other Ambulatory Visit: Payer: Self-pay

## 2024-09-28 NOTE — Progress Notes (Signed)
 Specialty Pharmacy Refill Coordination Note  Alison Campbell is a 78 y.o. female contacted today regarding refills of specialty medication(s) Pirfenidone    Patient requested (Patient-Rptd) Delivery   Delivery date: 10/06/24 Verified address: (Patient-Rptd) [address], [place], [place] [address]   Medication will be filled on 10/05/24.

## 2024-09-29 ENCOUNTER — Ambulatory Visit
Admission: RE | Admit: 2024-09-29 | Discharge: 2024-09-29 | Disposition: A | Discharge status: Home / Self Care | Source: Ambulatory Visit | Attending: Neurology | Admitting: Neurology

## 2024-09-29 DIAGNOSIS — I618 Other nontraumatic intracerebral hemorrhage: Secondary | ICD-10-CM | POA: Diagnosis not present

## 2024-09-29 MED ORDER — GADOPICLENOL 0.5 MMOL/ML IV SOLN
6.0000 mL | Freq: Once | INTRAVENOUS | Status: AC | PRN
  Administered 2024-09-29: 6 mL via INTRAVENOUS

## 2024-10-04 DIAGNOSIS — H5340 Unspecified visual field defects: Secondary | ICD-10-CM | POA: Diagnosis not present

## 2024-10-04 DIAGNOSIS — H53133 Sudden visual loss, bilateral: Secondary | ICD-10-CM | POA: Diagnosis not present

## 2024-10-04 DIAGNOSIS — I619 Nontraumatic intracerebral hemorrhage, unspecified: Secondary | ICD-10-CM | POA: Diagnosis not present

## 2024-10-05 ENCOUNTER — Telehealth: Payer: Self-pay

## 2024-10-05 ENCOUNTER — Other Ambulatory Visit (HOSPITAL_COMMUNITY): Payer: Self-pay

## 2024-10-05 ENCOUNTER — Other Ambulatory Visit: Payer: Self-pay

## 2024-10-05 NOTE — Telephone Encounter (Signed)
 Received notification via specialty pharmacy encounter that pa is needed for pirfenidone . Submitted an Urgent Prior Authorization request to [employer] for PIRFENIDONE  via CoverMyMeds. Will update once we receive a response.  Key: [id number]

## 2024-10-05 NOTE — Telephone Encounter (Signed)
 Received notification from [employer] regarding a prior authorization for PIRFENIDONE . Authorization has been APPROVED from 09/05/24 to 10/05/25. Approval letter sent to scan center.  Per test claim, copay for 30 days supply is $16  Authorization # [id number] Phone # [phone]

## 2024-10-05 NOTE — Progress Notes (Signed)
 PA approved until 10/05/25

## 2024-10-05 NOTE — Progress Notes (Signed)
 PA submitted, will update when we receive a response

## 2024-10-16 NOTE — Progress Notes (Signed)
 Kindly inform the patient that MRI scan of the brain shows expected resolution there is decrease in size of the brain hemorrhage from June 2025.  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  No new or worrisome findings.

## 2024-10-27 ENCOUNTER — Other Ambulatory Visit: Payer: Self-pay | Admitting: Internal Medicine

## 2024-10-27 ENCOUNTER — Other Ambulatory Visit (HOSPITAL_COMMUNITY): Payer: Self-pay

## 2024-10-27 ENCOUNTER — Other Ambulatory Visit: Payer: Self-pay

## 2024-10-27 DIAGNOSIS — J84112 Idiopathic pulmonary fibrosis: Secondary | ICD-10-CM

## 2024-10-27 MED ORDER — PIRFENIDONE 267 MG PO TABS
534.0000 mg | ORAL_TABLET | Freq: Three times a day (TID) | ORAL | 2 refills | Status: DC
  Filled 2024-10-27 – 2024-10-31 (×2): qty 180, 30d supply, fill #0
  Filled 2024-11-28: qty 180, 30d supply, fill #1
  Filled 2025-01-06: qty 180, 30d supply, fill #2

## 2024-10-27 NOTE — Telephone Encounter (Signed)
 Pt requesting refill of specialty medication - routing to Rx team to advise.

## 2024-10-27 NOTE — Telephone Encounter (Signed)
 Refill sent for PIRFENIDONE  to [facility]: [phone]   Dose: 534mg  by mouth three times daily   Last OV: 08/23/24 Provider: Dr. Geronimo Pertinent labs: LFTs Aug 2025 wnl   Next OV: due December or January, not yet scheduled  Routing to scheduling team for follow-up on appt scheduling  Aleck Puls, PharmD, BCPS Clinical Pharmacist  [facility]

## 2024-10-28 NOTE — Telephone Encounter (Signed)
 Sched 1/8 w/ Ramaswamy.

## 2024-10-31 ENCOUNTER — Other Ambulatory Visit (HOSPITAL_COMMUNITY): Payer: Self-pay

## 2024-10-31 ENCOUNTER — Encounter (INDEPENDENT_AMBULATORY_CARE_PROVIDER_SITE_OTHER): Payer: Self-pay

## 2024-10-31 ENCOUNTER — Other Ambulatory Visit: Payer: Self-pay | Admitting: Pharmacy Technician

## 2024-10-31 ENCOUNTER — Other Ambulatory Visit: Payer: Self-pay

## 2024-10-31 NOTE — Progress Notes (Signed)
 Specialty Pharmacy Refill Coordination Note  Alison Campbell is a 78 y.o. female contacted today regarding refills of specialty medication(s) Pirfenidone    Patient requested (Patient-Rptd) Delivery   Delivery date: 11/03/2024 Verified address: (Patient-Rptd) [address], [place], [place] [address]   Medication will be filled on: 11/02/2024

## 2024-11-01 ENCOUNTER — Other Ambulatory Visit: Payer: Self-pay

## 2024-11-04 ENCOUNTER — Encounter: Payer: Medicare Other | Admitting: Internal Medicine

## 2024-11-15 ENCOUNTER — Other Ambulatory Visit: Payer: Self-pay | Admitting: Family Medicine

## 2024-11-15 ENCOUNTER — Telehealth: Payer: Self-pay | Admitting: Neurology

## 2024-11-15 DIAGNOSIS — G4700 Insomnia, unspecified: Secondary | ICD-10-CM

## 2024-11-15 NOTE — Telephone Encounter (Signed)
 Patient's daughter, Rock Hummer need FMLA paperwork filled for her employer to be able to help her mother. Will be dropping off paperwork tomorrow at the front desk. Informed Ms. Hummer will be a $50 to have forms filled out.

## 2024-11-15 NOTE — Telephone Encounter (Signed)
 Copied from CRM [id number]. Topic: Clinical - Medication Refill >> Nov 15, 2024  8:49 AM Lonell PEDLAR wrote: Medication:  amLODipine  (NORVASC ) 10 MG tablet - Brain bleed/stroke QUEtiapine  (SEROQUEL ) 50 MG tablet - trouble sleeping   Has the patient contacted their pharmacy? Yes, pt requires provider authorization. Pt had a stroke over the summer and these medications were prescribed by the provider in the hospital. Patient is out of both medications.    This is the patient's preferred pharmacy:  [id number] [place] [place], [place] - [address] AT [facility] [address] [facility] [address] [place] [address] Phone: [phone] Fax: [phone]   Is this the correct pharmacy for this prescription? Yes If no, delete pharmacy and type the correct one.   Has the prescription been filled recently? Yes  Is the patient out of the medication? Yes  Has the patient been seen for an appointment in the last year OR does the patient have an upcoming appointment? Yes  Can we respond through MyChart? Yes  Agent: Please be advised that Rx refills may take up to 3 business days. We ask that you follow-up with your pharmacy.

## 2024-11-16 NOTE — Telephone Encounter (Signed)
 FMLA Form was completed , for Dr. Onita to review then sign.

## 2024-11-17 ENCOUNTER — Encounter: Payer: Medicare Other | Admitting: Internal Medicine

## 2024-11-17 DIAGNOSIS — Z0289 Encounter for other administrative examinations: Secondary | ICD-10-CM

## 2024-11-17 NOTE — Telephone Encounter (Signed)
 Requested medication (s) are due for refill today - yes  Requested medication (s) are on the active medication list -yes  Future visit scheduled -yes  Last refill: amlodipine - 06/14/24 #30 3RF- outside provider- sent for review                  Quetiapine - 08/09/24 #90 1RF- non delegated Rx, too soon  Notes to clinic: see above  Requested Prescriptions  Pending Prescriptions Disp Refills   amLODipine  (NORVASC ) 10 MG tablet 30 tablet 3    Sig: Take 1 tablet (10 mg total) by mouth daily.     Cardiovascular: Calcium  Channel Blockers 2 Passed - 11/17/2024  3:40 PM      Passed - Last BP in normal range    BP Readings from Last 1 Encounters:  09/01/24 128/74         Passed - Last Heart Rate in normal range    Pulse Readings from Last 1 Encounters:  09/01/24 80         Passed - Valid encounter within last 6 months    Recent Outpatient Visits           3 months ago Essential hypertension   [facility] Aletha Bene, MD               QUEtiapine  (SEROQUEL ) 50 MG tablet 90 tablet 1    Sig: Take 1 tablet (50 mg total) by mouth at bedtime.     Not Delegated - Psychiatry:  Antipsychotics - Second Generation (Atypical) - quetiapine  Failed - 11/17/2024  3:40 PM      Failed - This refill cannot be delegated      Failed - Lipid Panel in normal range within the last 12 months    Cholesterol  Date Value Ref Range Status  08/09/2024 113 <200 mg/dL Final   LDL Cholesterol (Calc)  Date Value Ref Range Status  08/09/2024 41 mg/dL (calc) Final    Comment:    Reference range: <100 . Desirable range <100 mg/dL for primary prevention;   <70 mg/dL for patients with CHD or diabetic patients  with > or = 2 CHD risk factors. SABRA LDL-C is now calculated using the Martin-Hopkins  calculation, which is a validated novel method providing  better accuracy than the Friedewald equation in the  estimation of LDL-C.  Gladis APPLETHWAITE et al. SANDREA. 7986;689(80): 2061-2068   (http://education.[facility].com/faq/FAQ164)    HDL  Date Value Ref Range Status  08/09/2024 46 (L) > OR = 50 mg/dL Final   Triglycerides  Date Value Ref Range Status  08/09/2024 179 (H) <150 mg/dL Final         Passed - TSH in normal range and within 360 days    TSH  Date Value Ref Range Status  09/01/2024 1.050 0.450 - 4.500 uIU/mL Final         Passed - Last BP in normal range    BP Readings from Last 1 Encounters:  09/01/24 128/74         Passed - Last Heart Rate in normal range    Pulse Readings from Last 1 Encounters:  09/01/24 80         Passed - Valid encounter within last 6 months    Recent Outpatient Visits           3 months ago Essential hypertension   [facility] Aletha Bene, MD  Passed - CBC within normal limits and completed in the last 12 months    WBC  Date Value Ref Range Status  08/09/2024 9.2 3.8 - 10.8 Thousand/uL Final   RBC  Date Value Ref Range Status  08/09/2024 4.20 3.80 - 5.10 Million/uL Final   Hemoglobin  Date Value Ref Range Status  08/09/2024 13.1 11.7 - 15.5 g/dL Final   HCT  Date Value Ref Range Status  08/09/2024 40.5 35.0 - 45.0 % Final   MCHC  Date Value Ref Range Status  08/09/2024 32.3 32.0 - 36.0 g/dL Final    Comment:    For adults, a slight decrease in the calculated MCHC value (in the range of 30 to 32 g/dL) is most likely not clinically significant; however, it should be interpreted with caution in correlation with other red cell parameters and the patient's clinical condition.    [facility]  Date Value Ref Range Status  08/09/2024 31.2 27.0 - 33.0 pg Final   MCV  Date Value Ref Range Status  08/09/2024 96.4 80.0 - 100.0 fL Final   No results found for: PLTCOUNTKUC, LABPLAT, POCPLA RDW  Date Value Ref Range Status  08/09/2024 12.8 11.0 - 15.0 % Final         Passed - CMP within normal limits and completed in the last 12 months    Albumin   Date Value Ref Range Status  06/14/2024 3.6 3.5 - 5.0 g/dL Final  90/96/7975 4.4 3.8 - 4.8 g/dL Final   Alkaline Phosphatase  Date Value Ref Range Status  06/14/2024 62 38 - 126 U/L Final   Alkaline phosphatase (APISO)  Date Value Ref Range Status  08/09/2024 59 37 - 153 U/L Final   ALT  Date Value Ref Range Status  08/09/2024 19 6 - 29 U/L Final   AST  Date Value Ref Range Status  08/09/2024 15 10 - 35 U/L Final   BUN  Date Value Ref Range Status  08/09/2024 25 7 - 25 mg/dL Final   Calcium   Date Value Ref Range Status  08/09/2024 9.7 8.6 - 10.4 mg/dL Final   Calcium , Ion  Date Value Ref Range Status  06/14/2024 1.24 1.15 - 1.40 mmol/L Final   CO2  Date Value Ref Range Status  08/09/2024 29 20 - 32 mmol/L Final   Bicarbonate  Date Value Ref Range Status  08/13/2022 25.8 20.0 - 28.0 mmol/L Final   TCO2  Date Value Ref Range Status  06/14/2024 23 22 - 32 mmol/L Final   Creat  Date Value Ref Range Status  08/09/2024 0.96 (H) 0.60 - 0.95 mg/dL Final   Creatinine, Urine  Date Value Ref Range Status  10/22/2023 46 20 - 275 mg/dL Final   Glucose, Bld  Date Value Ref Range Status  08/09/2024 75 65 - 99 mg/dL Final    Comment:    .            Fasting reference interval .    Glucose-Capillary  Date Value Ref Range Status  06/14/2024 115 (H) 70 - 99 mg/dL Final    Comment:    Glucose reference range applies only to samples taken after fasting for at least 8 hours.   Potassium  Date Value Ref Range Status  08/09/2024 4.8 3.5 - 5.3 mmol/L Final   Sodium  Date Value Ref Range Status  08/09/2024 142 135 - 146 mmol/L Final   Total Bilirubin  Date Value Ref Range Status  08/09/2024 0.2 0.2 - 1.2 mg/dL Final  Bilirubin Total  Date Value Ref Range Status  09/01/2023 0.2 0.0 - 1.2 mg/dL Final   Bilirubin, Direct  Date Value Ref Range Status  06/09/2024 0.1 0.0 - 0.2 mg/dL Final  90/96/7975 <9.89 0.00 - 0.40 mg/dL Final   Indirect Bilirubin  Date  Value Ref Range Status  06/09/2024 0.4 0.3 - 0.9 mg/dL Final    Comment:    Performed at [facility], [address] [place], [place] [address]   Protein, ur  Date Value Ref Range Status  06/09/2024 NEGATIVE NEGATIVE mg/dL Final   Total Protein  Date Value Ref Range Status  08/09/2024 6.9 6.1 - 8.1 g/dL Final  90/96/7975 7.0 6.0 - 8.5 g/dL Final   GFR, Est African American  Date Value Ref Range Status  06/18/2020 56 (L) > OR = 60 mL/min/1.53m2 Final   GFR  Date Value Ref Range Status  01/19/2023 61.19 >60.00 mL/min Final    Comment:    Calculated using the CKD-EPI Creatinine Equation (2021)   eGFR  Date Value Ref Range Status  08/09/2024 60 > OR = 60 mL/min/1.71m2 Final   GFR, Est Non African American  Date Value Ref Range Status  06/18/2020 48 (L) > OR = 60 mL/min/1.66m2 Final   GFR, Estimated  Date Value Ref Range Status  06/14/2024 51 (L) >60 mL/min Final    Comment:    (NOTE) Calculated using the CKD-EPI Creatinine Equation (2021)             Requested Prescriptions  Pending Prescriptions Disp Refills   amLODipine  (NORVASC ) 10 MG tablet 30 tablet 3    Sig: Take 1 tablet (10 mg total) by mouth daily.     Cardiovascular: Calcium  Channel Blockers 2 Passed - 11/17/2024  3:40 PM      Passed - Last BP in normal range    BP Readings from Last 1 Encounters:  09/01/24 128/74         Passed - Last Heart Rate in normal range    Pulse Readings from Last 1 Encounters:  09/01/24 80         Passed - Valid encounter within last 6 months    Recent Outpatient Visits           3 months ago Essential hypertension   [facility] Aletha Bene, MD               QUEtiapine  (SEROQUEL ) 50 MG tablet 90 tablet 1    Sig: Take 1 tablet (50 mg total) by mouth at bedtime.     Not Delegated - Psychiatry:  Antipsychotics - Second Generation (Atypical) - quetiapine  Failed - 11/17/2024  3:40 PM      Failed - This refill cannot be  delegated      Failed - Lipid Panel in normal range within the last 12 months    Cholesterol  Date Value Ref Range Status  08/09/2024 113 <200 mg/dL Final   LDL Cholesterol (Calc)  Date Value Ref Range Status  08/09/2024 41 mg/dL (calc) Final    Comment:    Reference range: <100 . Desirable range <100 mg/dL for primary prevention;   <70 mg/dL for patients with CHD or diabetic patients  with > or = 2 CHD risk factors. SABRA LDL-C is now calculated using the Martin-Hopkins  calculation, which is a validated novel method providing  better accuracy than the Friedewald equation in the  estimation of LDL-C.  Gladis APPLETHWAITE et al. SANDREA. 7986;689(80):  7938-7931  (http://education.[facility].com/faq/FAQ164)    HDL  Date Value Ref Range Status  08/09/2024 46 (L) > OR = 50 mg/dL Final   Triglycerides  Date Value Ref Range Status  08/09/2024 179 (H) <150 mg/dL Final         Passed - TSH in normal range and within 360 days    TSH  Date Value Ref Range Status  09/01/2024 1.050 0.450 - 4.500 uIU/mL Final         Passed - Last BP in normal range    BP Readings from Last 1 Encounters:  09/01/24 128/74         Passed - Last Heart Rate in normal range    Pulse Readings from Last 1 Encounters:  09/01/24 80         Passed - Valid encounter within last 6 months    Recent Outpatient Visits           3 months ago Essential hypertension   [facility] Aletha Bene, MD              Passed - CBC within normal limits and completed in the last 12 months    WBC  Date Value Ref Range Status  08/09/2024 9.2 3.8 - 10.8 Thousand/uL Final   RBC  Date Value Ref Range Status  08/09/2024 4.20 3.80 - 5.10 Million/uL Final   Hemoglobin  Date Value Ref Range Status  08/09/2024 13.1 11.7 - 15.5 g/dL Final   HCT  Date Value Ref Range Status  08/09/2024 40.5 35.0 - 45.0 % Final   MCHC  Date Value Ref Range Status  08/09/2024 32.3 32.0 - 36.0 g/dL  Final    Comment:    For adults, a slight decrease in the calculated MCHC value (in the range of 30 to 32 g/dL) is most likely not clinically significant; however, it should be interpreted with caution in correlation with other red cell parameters and the patient's clinical condition.    [facility]  Date Value Ref Range Status  08/09/2024 31.2 27.0 - 33.0 pg Final   MCV  Date Value Ref Range Status  08/09/2024 96.4 80.0 - 100.0 fL Final   No results found for: PLTCOUNTKUC, LABPLAT, POCPLA RDW  Date Value Ref Range Status  08/09/2024 12.8 11.0 - 15.0 % Final         Passed - CMP within normal limits and completed in the last 12 months    Albumin  Date Value Ref Range Status  06/14/2024 3.6 3.5 - 5.0 g/dL Final  90/96/7975 4.4 3.8 - 4.8 g/dL Final   Alkaline Phosphatase  Date Value Ref Range Status  06/14/2024 62 38 - 126 U/L Final   Alkaline phosphatase (APISO)  Date Value Ref Range Status  08/09/2024 59 37 - 153 U/L Final   ALT  Date Value Ref Range Status  08/09/2024 19 6 - 29 U/L Final   AST  Date Value Ref Range Status  08/09/2024 15 10 - 35 U/L Final   BUN  Date Value Ref Range Status  08/09/2024 25 7 - 25 mg/dL Final   Calcium   Date Value Ref Range Status  08/09/2024 9.7 8.6 - 10.4 mg/dL Final   Calcium , Ion  Date Value Ref Range Status  06/14/2024 1.24 1.15 - 1.40 mmol/L Final   CO2  Date Value Ref Range Status  08/09/2024 29 20 - 32 mmol/L Final   Bicarbonate  Date Value Ref Range Status  08/13/2022 25.8 20.0 -  28.0 mmol/L Final   TCO2  Date Value Ref Range Status  06/14/2024 23 22 - 32 mmol/L Final   Creat  Date Value Ref Range Status  08/09/2024 0.96 (H) 0.60 - 0.95 mg/dL Final   Creatinine, Urine  Date Value Ref Range Status  10/22/2023 46 20 - 275 mg/dL Final   Glucose, Bld  Date Value Ref Range Status  08/09/2024 75 65 - 99 mg/dL Final    Comment:    .            Fasting reference interval .    Glucose-Capillary  Date  Value Ref Range Status  06/14/2024 115 (H) 70 - 99 mg/dL Final    Comment:    Glucose reference range applies only to samples taken after fasting for at least 8 hours.   Potassium  Date Value Ref Range Status  08/09/2024 4.8 3.5 - 5.3 mmol/L Final   Sodium  Date Value Ref Range Status  08/09/2024 142 135 - 146 mmol/L Final   Total Bilirubin  Date Value Ref Range Status  08/09/2024 0.2 0.2 - 1.2 mg/dL Final   Bilirubin Total  Date Value Ref Range Status  09/01/2023 0.2 0.0 - 1.2 mg/dL Final   Bilirubin, Direct  Date Value Ref Range Status  06/09/2024 0.1 0.0 - 0.2 mg/dL Final  90/96/7975 <9.89 0.00 - 0.40 mg/dL Final   Indirect Bilirubin  Date Value Ref Range Status  06/09/2024 0.4 0.3 - 0.9 mg/dL Final    Comment:    Performed at [facility], [address], [place], [place] [address]   Protein, ur  Date Value Ref Range Status  06/09/2024 NEGATIVE NEGATIVE mg/dL Final   Total Protein  Date Value Ref Range Status  08/09/2024 6.9 6.1 - 8.1 g/dL Final  90/96/7975 7.0 6.0 - 8.5 g/dL Final   GFR, Est African American  Date Value Ref Range Status  06/18/2020 56 (L) > OR = 60 mL/min/1.55m2 Final   GFR  Date Value Ref Range Status  01/19/2023 61.19 >60.00 mL/min Final    Comment:    Calculated using the CKD-EPI Creatinine Equation (2021)   eGFR  Date Value Ref Range Status  08/09/2024 60 > OR = 60 mL/min/1.66m2 Final   GFR, Est Non African American  Date Value Ref Range Status  06/18/2020 48 (L) > OR = 60 mL/min/1.51m2 Final   GFR, Estimated  Date Value Ref Range Status  06/14/2024 51 (L) >60 mL/min Final    Comment:    (NOTE) Calculated using the CKD-EPI Creatinine Equation (2021)

## 2024-11-18 MED ORDER — AMLODIPINE BESYLATE 10 MG PO TABS
10.0000 mg | ORAL_TABLET | Freq: Every day | ORAL | 3 refills | Status: AC
Start: 2024-11-18 — End: ?

## 2024-11-18 MED ORDER — QUETIAPINE FUMARATE 50 MG PO TABS: 50.0000 mg | ORAL_TABLET | Freq: Every day | ORAL | 1 refills | Status: AC

## 2024-11-21 ENCOUNTER — Other Ambulatory Visit (HOSPITAL_COMMUNITY): Payer: Self-pay

## 2024-11-21 NOTE — Telephone Encounter (Signed)
 [name] called asking to be connected to Medical Records for the status of paperwork completion.

## 2024-11-25 ENCOUNTER — Other Ambulatory Visit (HOSPITAL_COMMUNITY): Payer: Self-pay

## 2024-11-28 ENCOUNTER — Other Ambulatory Visit (HOSPITAL_COMMUNITY): Payer: Self-pay

## 2024-11-30 ENCOUNTER — Other Ambulatory Visit: Payer: Self-pay

## 2024-11-30 NOTE — Progress Notes (Signed)
 Specialty Pharmacy Refill Coordination Note  Alison Campbell is a 78 y.o. female contacted today regarding refills of specialty medication(s) Pirfenidone    Patient requested Delivery   Delivery date: 12/15/24   Verified address: [address], [place]. [address]   Medication will be filled on: 12/14/24

## 2024-12-11 ENCOUNTER — Encounter (INDEPENDENT_AMBULATORY_CARE_PROVIDER_SITE_OTHER): Payer: Self-pay

## 2024-12-12 ENCOUNTER — Encounter: Payer: Self-pay | Admitting: Family Medicine

## 2024-12-12 ENCOUNTER — Other Ambulatory Visit: Payer: Self-pay

## 2024-12-12 ENCOUNTER — Ambulatory Visit: Admitting: Family Medicine

## 2024-12-12 VITALS — BP 120/64 | HR 83 | Temp 98.4°F | Ht 63.0 in | Wt 134.0 lb

## 2024-12-12 DIAGNOSIS — E782 Mixed hyperlipidemia: Secondary | ICD-10-CM

## 2024-12-12 DIAGNOSIS — Z79899 Other long term (current) drug therapy: Secondary | ICD-10-CM

## 2024-12-12 DIAGNOSIS — Z23 Encounter for immunization: Secondary | ICD-10-CM

## 2024-12-12 DIAGNOSIS — N1831 Chronic kidney disease, stage 3a: Secondary | ICD-10-CM

## 2024-12-12 DIAGNOSIS — Z7289 Other problems related to lifestyle: Secondary | ICD-10-CM

## 2024-12-12 DIAGNOSIS — R5383 Other fatigue: Secondary | ICD-10-CM

## 2024-12-12 DIAGNOSIS — G4700 Insomnia, unspecified: Secondary | ICD-10-CM

## 2024-12-12 DIAGNOSIS — J849 Interstitial pulmonary disease, unspecified: Secondary | ICD-10-CM

## 2024-12-12 MED ORDER — EZETIMIBE 10 MG PO TABS: ORAL_TABLET | ORAL | 3 refills | Status: AC

## 2024-12-12 NOTE — Progress Notes (Signed)
 Patient Office Visit  Assessment & Plan:  Hyperlipidemia, mixed -     Ezetimibe ; TAKE 1 TABLET BY MOUTH DAILY FOR CHOLESTEROL  Dispense: 90 tablet; Refill: 3 -     Lipid panel  Need for influenza vaccination -     Flu vaccine HIGH DOSE PF(Fluzone Trivalent)  Current every day vaping  Taking a statin medication -     Comprehensive metabolic panel with GFR  Stage 3a chronic kidney disease (HCC) -     CBC with Differential/Platelet -     Comprehensive metabolic panel with GFR  Other fatigue -     TSH -     VITAMIN D  25 Hydroxy (Vit-D Deficiency, Fractures) -     Vitamin B12 -     Iron, TIBC and Ferritin Panel  Insomnia, unspecified type  ILD (interstitial lung disease) (HCC)   Assessment and Plan    Interstitial lung disease Managed with pirfenidone . Fatigue may be a side effect. - Continue pirfenidone  as prescribed by pulmonologist. - Follow up with pulmonologist on January 8th.  Stage 3a chronic kidney disease Slightly abnormal kidney function. Advised to stop NSAIDs and increase water intake. - Stopped NSAIDs. - Increased water intake. - Recheck kidney function in three months.  Mixed hyperlipidemia Managed with ezetimibe  and rosuvastatin . - Continue ezetimibe  and rosuvastatin .  Insomnia Managed with quetiapine , effective without side effects. - Continue quetiapine .  Fatigue Persistent fatigue possibly related to pirfenidone .  Current every day vaping Using vape to quit cigarettes. Has not smoked in a month, working on quitting vaping. - Encouraged cessation of vaping.  General Health Maintenance High risk for flu. Has not received shingles vaccine. - Administered high dose flu shot. - Discussed shingles vaccine, consider later administration.      Recommend healthy diet i.e mediterranean/DASH diet, consistent exercise - 30 minutes 5 day per week. Patient will get the Shingrix next time or at the pharmacy Return in about 6 months (around  06/12/2025), or if symptoms worsen or fail to improve.   Subjective:    Patient ID: Alison Campbell, female    DOB: 1946-01-18  Age: 78 y.o. MRN: [id number]  Chief Complaint  Patient presents with   Medical Management of Chronic Issues    Here for 4 mo f/u. Pt states she is always tired and is having trouble with her L leg.     HPI Discussed the use of AI scribe software for clinical note transcription with the patient, who gave verbal consent to proceed.  History of Present Illness       History of Present Illness Alison Campbell is an 78 year old female with pulmonary fibrosis who presents for a follow-up visit.  She experiences persistent fatigue despite adequate sleep, which she attributes to aging. She has been on pirfenidone  since August of last year following a biopsy for pulmonary fibrosis and notes that fatigue is a known side effect of the medication. Her medication regimen includes ezetimibe , rosuvastatin , and amlodipine  in the morning, followed by pirfenidone  after breakfast. There have been no changes in her medications since the last visit. She is currently taking quetiapine  for sleep, which she finds effective without negative side effects. Despite this, she continues to experience fatigue.  She has a history of two strokes, with the most recent occurring after her 78th birthday. During the last stroke, she experienced speech difficulties, and her daughter called 911. A CT scan with contrast was performed, which resulted in severe agitation and aggressive behavior, requiring restraint  by medical staff.  She has a history of a shattered kneecap in October of last year, followed by eight weeks of physical therapy. She also pulled a muscle in her other leg, which limits her physical activity. She walks for exercise but is limited by her leg and knee issues.  She has switched from smoking cigarettes to vaping in an effort to quit smoking entirely. She has not smoked a cigarette  in about a month and is using a vape with a low nicotine  concentration.  Her diet is not optimal, often consisting of sandwiches or quick meals, and she does not cook as much as she used to. She has not received a flu shot yet and has not had a mammogram recently or received the new shingles vaccine.  Physical Exam MEASUREMENTS: Weight- 134 lbs. CHEST: Lungs clear to auscultation bilaterally.  Assessment and Plan Interstitial lung disease Managed with pirfenidone . Fatigue may be a side effect. - Continue pirfenidone  as prescribed by pulmonologist. - Follow up with pulmonologist on January 8th.  Stage 3a chronic kidney disease Slightly abnormal kidney function. Advised to stop NSAIDs and increase water intake. - Stopped NSAIDs. - Increased water intake. - Recheck kidney function in three months.  Mixed hyperlipidemia Managed with ezetimibe  and rosuvastatin . - Continue ezetimibe  and rosuvastatin .  Insomnia Managed with quetiapine , effective without side effects. - Continue quetiapine .  Fatigue Persistent fatigue possibly related to pirfenidone .  Current every day vaping Using vape to quit cigarettes. Has not smoked in a month, working on quitting vaping. - Encouraged cessation of vaping.  General Health Maintenance High risk for flu. Has not received shingles vaccine. - Administered high dose flu shot. - Discussed shingles vaccine, consider later administration.    The ASCVD Risk score (Arnett DK, et al., 2019) failed to calculate for the following reasons:   The 2019 ASCVD risk score is only valid for ages 18 to 85   Risk score cannot be calculated because patient has a medical history suggesting prior/existing ASCVD   * - Cholesterol units were assumed  Past Medical History:  Diagnosis Date   Allergy 06/08/24   Iohexol  and iodinated contrast dyes   Anxiety    Atherosclerosis    Cataract 08/01/2024   CKD (chronic kidney disease) stage 3, GFR 30-59 ml/min (HCC)  12/24/2020   Colon polyps    COPD (chronic obstructive pulmonary disease) (HCC)    pulmonary fibrosis   Costochondritis    Elevated blood pressure reading    no meds   Emphysema of lung (HCC)    GERD (gastroesophageal reflux disease)    coffee related   Hyperlipidemia    Hypertension    Insomnia    Osteoporosis    Pre-diabetes    Stroke (HCC) 06/07/2024   Right temporal parenchymal hemorrhagic.   Past Surgical History:  Procedure Laterality Date   ABDOMINAL HYSTERECTOMY     COLONOSCOPY  09/10/2011   hysterectomy     INTERCOSTAL NERVE BLOCK Right 08/15/2022   Procedure: INTERCOSTAL NERVE BLOCK;  Surgeon: Kerrin Elspeth BROCKS, MD;  Location: [facility];  Service: Thoracic;  Laterality: Right;   LUNG BIOPSY Right 08/15/2022   Procedure: LUNG BIOPSY;  Surgeon: Kerrin Elspeth BROCKS, MD;  Location: [facility] OR;  Service: Thoracic;  Laterality: Right;   ORIF PATELLA Right 10/30/2023   Procedure: OPEN REDUCTION INTERNAL FIXATION (ORIF) PATELLA;  Surgeon: Kay Kemps, MD;  Location: [facility] OR[facility];  Service: Orthopedics;  Laterality: Right;  90   POLYPECTOMY     UVULECTOMY N/A 09/09/2016  Procedure: PARTIAL UVULECTOMY, direct laryngoscopy;  Surgeon: Lonni FORBES Angle, MD;  Location: [facility];  Service: ENT;  Laterality: N/A;  PARTIAL UVULECTOMY, direct laryngoscopy   Social History[1] Family History  Problem Relation Age of Onset   Heart disease Mother    Heart attack Mother    Heart disease Father    Diabetes Father    Heart attack Father    Cancer Brother        Lung   Bipolar disorder Daughter    Colon cancer Neg Hx    Esophageal cancer Neg Hx    Stomach cancer Neg Hx    Rectal cancer Neg Hx    Allergies[2]  ROS    Objective:    BP 120/64   Pulse 83   Temp 98.4 F (36.9 C)   Ht 5' 3 (1.6 m)   Wt 134 lb (60.8 kg)   SpO2 99%   BMI 23.74 kg/m  BP Readings from Last 3 Encounters:  12/12/24 120/64  09/01/24 128/74  08/23/24 114/68   Wt Readings from  Last 3 Encounters:  12/12/24 134 lb (60.8 kg)  09/01/24 132 lb (59.9 kg)  08/23/24 134 lb (60.8 kg)    Physical Exam Vitals and nursing note reviewed.  Constitutional:      General: She is not in acute distress.    Appearance: Normal appearance.  HENT:     Head: Normocephalic.     Right Ear: Tympanic membrane, ear canal and external ear normal.     Left Ear: Tympanic membrane, ear canal and external ear normal.  Eyes:     Extraocular Movements: Extraocular movements intact.     Conjunctiva/sclera: Conjunctivae normal.     Pupils: Pupils are equal, round, and reactive to light.  Cardiovascular:     Rate and Rhythm: Normal rate and regular rhythm.     Heart sounds: Normal heart sounds.  Pulmonary:     Effort: Pulmonary effort is normal.     Breath sounds: Normal breath sounds.  Musculoskeletal:     Right lower leg: No edema.     Left lower leg: No edema.  Neurological:     General: No focal deficit present.     Mental Status: She is alert and oriented to person, place, and time.  Psychiatric:        Mood and Affect: Mood normal.        Behavior: Behavior normal.        Thought Content: Thought content normal.        Judgment: Judgment normal.      No results found for any visits on 12/12/24.          [1]  Social History Tobacco Use   Smoking status: Former    Current packs/day: 0.00    Average packs/day: 1 pack/day for 57.6 years (57.6 ttl pk-yrs)    Types: Cigarettes    Start date: 69    Quit date: 08/15/2022    Years since quitting: 2.3   Smokeless tobacco: Never  Vaping Use   Vaping status: Every Day  Substance Use Topics   Alcohol use: No    Alcohol/week: 0.0 standard drinks of alcohol   Drug use: No  [2]  Allergies Allergen Reactions   Iodinated Contrast Media Other (See Comments)    Pt states she gets viscous when contrast is administered.    Omnipaque  [Iohexol ] Other (See Comments)    EXTREME AGITATION   Fish Oil Diarrhea   Spiriva   Respimat [Tiotropium Bromide] Cough    Gaging and excessive phlegm

## 2024-12-13 ENCOUNTER — Ambulatory Visit: Payer: Self-pay | Admitting: Family Medicine

## 2024-12-13 LAB — CBC WITH DIFFERENTIAL/PLATELET
Absolute Lymphocytes: 1668 {cells}/uL (ref 850–3900)
Absolute Monocytes: 572 {cells}/uL (ref 200–950)
Basophils Absolute: 58 {cells}/uL (ref 0–200)
Basophils Relative: 0.6 %
Eosinophils Absolute: 504 {cells}/uL — ABNORMAL HIGH (ref 15–500)
Eosinophils Relative: 5.2 %
HCT: 39.4 % (ref 35.9–46.0)
Hemoglobin: 12.7 g/dL (ref 11.7–15.5)
MCH: 30.1 pg (ref 27.0–33.0)
MCHC: 32.2 g/dL (ref 31.6–35.4)
MCV: 93.4 fL (ref 81.4–101.7)
MPV: 10.3 fL (ref 7.5–12.5)
Monocytes Relative: 5.9 %
Neutro Abs: 6897 {cells}/uL (ref 1500–7800)
Neutrophils Relative %: 71.1 %
Platelets: 347 Thousand/uL (ref 140–400)
RBC: 4.22 Million/uL (ref 3.80–5.10)
RDW: 11.8 % (ref 11.0–15.0)
Total Lymphocyte: 17.2 %
WBC: 9.7 Thousand/uL (ref 3.8–10.8)

## 2024-12-13 LAB — COMPREHENSIVE METABOLIC PANEL WITH GFR
AG Ratio: 1.6 (calc) (ref 1.0–2.5)
ALT: 20 U/L (ref 6–29)
AST: 15 U/L (ref 10–35)
Albumin: 4.2 g/dL (ref 3.6–5.1)
Alkaline phosphatase (APISO): 74 U/L (ref 37–153)
BUN: 18 mg/dL (ref 7–25)
CO2: 29 mmol/L (ref 20–32)
Calcium: 9.7 mg/dL (ref 8.6–10.4)
Chloride: 105 mmol/L (ref 98–110)
Creat: 0.95 mg/dL (ref 0.60–0.95)
Globulin: 2.6 g/dL (ref 1.9–3.7)
Glucose, Bld: 92 mg/dL (ref 65–99)
Potassium: 4 mmol/L (ref 3.5–5.3)
Sodium: 143 mmol/L (ref 135–146)
Total Bilirubin: 0.3 mg/dL (ref 0.2–1.2)
Total Protein: 6.8 g/dL (ref 6.1–8.1)
eGFR: 61 mL/min/1.73m2 (ref >=60)

## 2024-12-13 LAB — IRON,TIBC AND FERRITIN PANEL
%SAT: 34 % (ref 16–45)
Ferritin: 110 ng/mL (ref 16–288)
Iron: 105 ug/dL (ref 45–160)
TIBC: 310 ug/dL (ref 250–450)

## 2024-12-13 LAB — VITAMIN B12: Vitamin B-12: 447 pg/mL (ref 200–1100)

## 2024-12-13 LAB — LIPID PANEL
Cholesterol: 139 mg/dL (ref <200)
HDL: 48 mg/dL — ABNORMAL LOW (ref >=50)
LDL Cholesterol (Calc): 61 mg/dL
Non-HDL Cholesterol (Calc): 91 mg/dL (ref <130)
Total CHOL/HDL Ratio: 2.9 (calc) (ref <5.0)
Triglycerides: 254 mg/dL — ABNORMAL HIGH (ref <150)

## 2024-12-13 LAB — TSH: TSH: 0.92 m[IU]/L (ref 0.40–4.50)

## 2024-12-13 LAB — VITAMIN D 25 HYDROXY (VIT D DEFICIENCY, FRACTURES): Vit D, 25-Hydroxy: 71 ng/mL (ref 30–100)

## 2024-12-14 ENCOUNTER — Other Ambulatory Visit: Payer: Self-pay

## 2025-01-04 NOTE — Progress Notes (Signed)
 "      Synopsis: Referred in November 2022 for lung nodule by Tonita Fallow, MD  Subjective:   PATIENT ID: Alison Campbell GENDER: female DOB: [birth year], MRN: [id number]  Chief Complaint  Patient presents with   Follow-up    Follow up. Patient says she's been coughing up a lot of phlegm.     This is a 79 year old female, past medical history of gastroesophageal reflux, CKD, hyperlipidemia.Patient had a lung cancer screening CT on 11/02/2021.  Lung cancer screening CT revealed multiple small pulmonary nodules.  There was a new pleural-based nodule in the right upper lobe.  With a mean dry volume of 5.3 mm in diameter.  No other significant suspicious lesions.  Also has associated centrilobular and paraseptal emphysema.  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  Patient was referred to pulmonary for evaluation.  OV 11/13/2021: Here today to discuss abnormal CT imaging.  We also had a long discussion regarding smoking cessation.  Please see separate documentation regarding smoking cessation.  Patient has smoked since age 27.  She quit for 9 years however has had approximately 40+ pack years smoking at around half to 1 full pack per day.  OV 05/29/2022: Here today for follow-up recent CT scan.CT scan of the chest was completed on 05/09/2022 this was in follow-up of a right upper lobe pulmonary nodule.  Findings felt to be consistent with infection or inflammatory lesion on CT.  There is unchanged mild pulmonary fibrosis with apical to basal gradient irregular peripheral interstitial opacities groundglass and varicoid bronchiectasis without any evidence of honeycombing.  Findings on the CT were suggestive of an alternate diagnosis besides UIP.  From a respiratory standpoint she is doing okay.  She is not very active so she is not seeing much difference in her shortness of breath related to exertion.   OV 07/03/2022  Subjective:   Patient ID: Alison Campbell, female , DOB: 1946-10-19 , age 88 y.o. , MRN: [id number] , ADDRESS: [address] [place] [address] PCP Tonita Fallow, MD Patient Care Team: Tonita Fallow, MD as PCP - General (Internal Medicine) Teressa Toribio SQUIBB, MD as Attending Physician (Gastroenterology) Jakie Alm SAUNDERS, MD as Consulting Physician (Gastroenterology) Ethyl Lonni BRAVO, MD (Inactive) as Consulting Physician (Otolaryngology) Ivin Kocher, MD as Consulting Physician (Dermatology) Brenna Adine CROME, DO as Consulting Physician (Pulmonary Disease)  This Provider for this visit: Treatment Team:  Attending Provider: Geronimo Amel, MD    07/03/2022 -transfer of care from Dr. Adine Icard to the pulm fibrosis center Dr. Geronimo Chief Complaint  Patient presents with   Follow-up    Pt states she has been doing okay since last visit and denies any complaints.     HPI Alison Campbell 79 y.o. -pleasant female has been getting annual low-dose CT scans since 2018 annually.  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  Therefore she has been referred here.  According to the patient she is quite asymptomatic.  She barely has any symptoms and she feels fine.  She did have some cough in November 2022 and this is actually cleared up.  Current symptom severity is listed below   [facility] Comprehensive ILD Questionnaire  Symptoms:  Very mild    Past Medical History :  -She believes she might have some COPD.  Denies any asthma connective  tissue disease.  Denies any stroke seizures hepatitis pulmonary hypertension.  Denies kidney disease.  Denies heart disease denies pleurisy. -She had COVID-vaccine but not the COVID   ROS:  8 pound intentional weight loss but otherwise negative  FAMILY HISTORY of LUNG DISEASE:  -She has an interesting family  history.  Her maternal grandmother was the cousin of any Renie  the [employer] ([email or web address]).  Her father was himself born in 25.  She was born in [birth year].  There is no family history of any lung disease.  PERSONAL EXPOSURE HISTORY:  -She started smoking in 1966.  She still smokes cigarettes.  In between she quit for 9 years.  No cigar smoking.  No marijuana smoking no cocaine no intravenous drug use  HOME  EXPOSURE and HOBBY DETAILS :  Single-family home for the last 29 years.  Age of the home is 50 years.  She does some gardening but otherwise detailed organic antigen exposure history in the house is negative  OCCUPATIONAL HISTORY (122 questions) : She is done some foot care work but otherwise detailed organic and inorganic antigen exposure history is negative  PULMONARY TOXICITY HISTORY (27 items):  She has been on prednisone  in the past  INVESTIGATIONS:  Personally visualized all the CT scans and showed it to her agree with current thinking of alternative to UIP diagnosis.  annual LDCT 2018, 2019, 2020 and 2021 July and 2022 November . ILD first repored in July 2021   and this the LDCT reported ILD as progressive though prior scan no mention of ILD. In my professional opinion: some ILD was there in 2019:. HRCT then done Nov 2022 - reported as prob UI and the May 2023 HRCT reported as altermnative dx. AGree with alternative diagnsos    CT Chest data - HRCT May 2023  Narrative & Impression  CLINICAL DATA:  Follow-up right upper lobe pulmonary nodule   EXAM: CT CHEST WITHOUT CONTRAST   TECHNIQUE: Multidetector CT imaging of the chest was performed following the standard protocol without intravenous contrast. High resolution imaging of the lungs, as well as inspiratory and expiratory imaging, was performed.   RADIATION DOSE REDUCTION: This exam was performed according to the departmental dose-optimization program which includes  automated exposure control, adjustment of the mA and/or kV according to patient size and/or use of iterative reconstruction technique.   COMPARISON:  11/02/2021   FINDINGS: Cardiovascular: Aortic atherosclerosis. Normal heart size. Left and right coronary artery calcifications. No pericardial effusion.   Mediastinum/Nodes: No enlarged mediastinal, hilar, or axillary lymph nodes. Thyroid  gland, trachea, and esophagus demonstrate no significant findings.   Lungs/Pleura: Minimal paraseptal emphysema. Subpleural nodule of the anterior right upper lobe new on prior examination is diminished in fullness and conspicuity, with an irregular remnant in this vicinity (series 13, image 109). Additional small pulmonary nodules are unchanged. Unchanged mild pulmonary fibrosis in a pattern with apical to basal gradient featuring irregular peripheral interstitial opacity, ground-glass, and varicoid bronchiectasis without evidence of subpleural bronchiolectasis or honeycombing. No significant air trapping on expiratory phase imaging. No pleural effusion or pneumothorax.   Upper Abdomen: No acute abnormality.   Musculoskeletal: No chest wall abnormality. No suspicious osseous lesions identified.   IMPRESSION: 1. Subpleural nodule of the anterior right upper lobe new on prior examination is diminished in fullness and conspicuity, with an irregular remnant in this vicinity. Findings are consistent nonspecific sequelae of infection or inflammation, for which no further specific follow-up is required. Additional small pulmonary nodules are unchanged. Lung RADS category  2, benign appearance or behavior. Consider ongoing low-dose CT lung cancer screening if indicated by patient age, smoking history, and/or other risk factors for lung cancer. 2. Unchanged mild pulmonary fibrosis in a pattern with apical to basal gradient featuring irregular peripheral interstitial opacity, ground-glass, and varicoid  bronchiectasis without evidence of subpleural bronchiolectasis or honeycombing. Findings are suggestive of an alternative diagnosis (not UIP) per consensus guidelines: Diagnosis of Idiopathic Pulmonary Fibrosis: An Official ATS/ERS/JRS/ALAT Clinical Practice Guideline. Am JINNY Honey Crit Care Med Vol 198, Iss 5, [phone], Aug 29 2017. 3. Minimal emphysema. 4. Coronary artery disease.   Aortic Atherosclerosis (ICD10-I70.0) and Emphysema (ICD10-J43.9).     Electronically Signed   By: Marolyn JONETTA Jaksch M.D.   On: 05/12/2022 15:42        PFT  OV 10/01/2022  Subjective:  Patient ID: Alison Campbell, female , DOB: [birth year] , age 49 y.o. , MRN: [id number] , ADDRESS: [address] [place] [address] PCP Tonita Fallow, MD Patient Care Team: Tonita Fallow, MD as PCP - General (Internal Medicine) Teressa Toribio SQUIBB, MD as Attending Physician (Gastroenterology) Jakie Alm SAUNDERS, MD as Consulting Physician (Gastroenterology) Ethyl Lonni BRAVO, MD (Inactive) as Consulting Physician (Otolaryngology) Ivin Kocher, MD as Consulting Physician (Dermatology) Brenna Adine CROME, DO as Consulting Physician (Pulmonary Disease)  This Provider for this visit: Treatment Team:  Attending Provider: Geronimo Amel, MD    10/01/2022 -   Chief Complaint  Patient presents with   Follow-up    Pt recently had a procedure performed by Dr. Kerrin.  Pt states she has been doing okay since last visit. States has some soreness from the procedure that was done.     HPI Conleigh Heinlein 79 y.o. -presents with her daughter Rock Hummer who is a optician, dispensing.  Rock used to work for the [employer] and also [employer].  Patient CT scan had alternative diagnosis to UIP.  Therefore she underwent surgical lung biopsy on 08/15/2022.  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  We discussed several aspects of IPF mainly that is progressive disease with  significant variability.  Overall she is stable.  Other than the soreness from the surgery she is fine.  We discussed 3 options of nintedanib versus pirfenidone  versus participating in clinical trial with a modified form of pirfenidone .  The summary of this is detailed below.  She absolutely does not want area side effect.  Therefore she not interested in nintedanib.  She is known to have diverticulosis and is concerned this might provoke diverticulitis therefore even more caution against nintedanib.  We discussed pirfenidone  with key elements below.  We discussed the [facility] trial along with the concept of clinical trials as a care option.  She is taken a copy of the consent to reflect on what would be the appropriate course for her which would either be take standard of care pirfenidone  or participate in the clinical trial.   She quit smoking 09/15/22         PFT  SLB [id number]  SURGICAL PATHOLOGY  CASE: [id number]  PATIENT: Terricka Madlock  Surgical Pathology Report      Clinical History: Interstitial lung disease (nt)      FINAL MICROSCOPIC DIAGNOSIS:   A. 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 LUNG, RIGHT UPPER LOBE, RESECTION:  - Minimal to mild interstitial fibrosis  with elastosis (see comment)   C. 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 There is evidence of architectural distortion with  peribronchiolar and smooth muscle metaplasia. Multiple fibroblastic foci  are seen along with focal microscopic honeycombing.  Granulomas or  increased eosinophils are not seen. Overall, the distribution and nature  of the interstitial fibrosis in this case are most suggestive of usual  interstitial pneumonia (UIP) pattern. UIP pattern may be seen with  collagen vascular diseases, drug reactions, or as an idiopathic disease  (idiopathic pulmonary fibrosis). Scattered lymphoid aggregates within  the affected areas may favor a collagen vascular disease. Clinical and  radiographic correlation is necessary.    OV 01/19/2023  Subjective:  Patient ID: Maryalice Pasley, female , DOB: 29-Jan-1946 , age 68 y.o. , MRN: [id number] , ADDRESS: [address] [place] [address] PCP Tonita Fallow, MD Patient Care Team: Tonita Fallow, MD as PCP - General (Internal Medicine) Teressa Toribio SQUIBB, MD as Attending Physician (Gastroenterology) Jakie Alm SAUNDERS, MD as Consulting Physician (Gastroenterology) Ethyl Lonni BRAVO, MD (Inactive) as Consulting Physician (Otolaryngology) Ivin Kocher, MD as Consulting Physician (Dermatology) Brenna Adine CROME, DO as Consulting Physician (Pulmonary Disease)  This Provider for this visit: Treatment Team:  Attending Provider: Geronimo Amel, MD   01/19/2023 -   Chief Complaint  Patient presents with   Follow-up    Follow up for IPF. Pt is still continuing the pirfenidone  three times a day with no issues noted at this time.      HPI Ginette Bradway 79 y.o. -returns for follow-up.  Last seen in the fall 2023.  She continues to do well.  She says symptoms are stable.  Not much shortness of breath.  She is not on oxygen.  She is tolerating pirfenidone  really well except for slight diminished appetite but no weight loss.  No nausea no vomiting no diarrhea no GI upset no fatigue no skin rash.  She is relapsed with her smoking.  She is smoking only few cigarettes though.  She says she is trying to cut down.  Last pulmonary function test and high-resolution CT chest was  in May 2023.  Her last set of labs was in October 2023.  I reviewed this.  She needs another set of labs.  Of note she still has some incisional pain from lung biopsy.  It is very mild and tolerable she does not want medicines.  She has upcoming appointment Dr. Kerrin.  We discussed participation in DEXA bone scan research protocol.  She is interested in this.  Pulmonary fibrosis patients might be at high risk for osteoporosis.  She has not had a bone scan in some years.  Given a copy of the consent form.  I also emailed the daughter the consent form.  The daughter is not here with her today.  Her daughter Rock is now working at [employer] as a engineer, civil (consulting).  She is pretty excited about that.      Modified Six Minute Walk - 01/19/23 1000     Type of O2 used  Room Air    Number of laps completed  3    Lap Pace Moderate    Resting Heartrate 80 bpm  Final Heartrate 92 bpm    Resting Pulse Ox 100 %    Desaturated to <= 3 points No    Desaturated to < 88% No    Became tachycardic No    Symptoms  no symptoms noted while walking 3 laps    Was the O2 correction test done? No    comments lab1: 98%/92, lap2:100%/94, lap3:100%/95 and after: 100%/92           OV 05/21/2023  Subjective:  Patient ID: Mele Sylvester, female , DOB: 1946-08-18 , age 40 y.o. , MRN: [id number] , ADDRESS: [address] [place] [address] PCP Tonita Fallow, MD Patient Care Team: Tonita Fallow, MD as PCP - General (Internal Medicine) Teressa Toribio SQUIBB, MD as Attending Physician (Gastroenterology) Jakie Alm SAUNDERS, MD as Consulting Physician (Gastroenterology) Ethyl Lonni BRAVO, MD (Inactive) as Consulting Physician (Otolaryngology) Ivin Kocher, MD as Consulting Physician (Dermatology) Brenna Adine CROME, DO as Consulting Physician (Pulmonary Disease)  This Provider for this visit: Treatment Team:  Attending Provider: Geronimo Amel, MD    05/21/2023 -   Chief Complaint   Patient presents with   Follow-up    F/up on PFT and CT scan     HPI Argelia Formisano 79 y.o. -returns for follow-up.  She continues to feel stable from a dyspnea standpoint.  Her liver function test in May 2024 was normal.  Her symptom score below shows stability.  She had pulmonary function test and high-resolution CT scan of the chest and these are stable.  In terms of pirfenidone : She feels she is tolerating it well.  She gets a little fatigue after taking it but then it is transient and resolves.  But just as I began to examine her she told me that she has got rash on her bilateral forearms.  She said it happened 4 weeks ago while she was doing some bug spray in the outdoors.  She was sun exposed in her forearms.  There is no itching or burning.  It is punctate rash.  She thinks that that it is a bug bite.  But it has not resolved.  I did explain to her that pirfenidone  can cause sun exposed rash.  She does not apply sunscreen because she rarely goes outside.  I did remind her that she needs to wear sunscreen even if she goes out for 5 minutes.  We took a shared decision making that she will stop pirfenidone  for 2 weeks and then restart and monitor if the rash goes away.  Lung cancer screening: Recent CT scan of the chest May 2024 high-resolution CT chest for pulmonary fibrosis did not show any evidence of lung cancer  Smoking: We discussed quitting smoking.  She said that she quit for 9 years while living in [place]  but when her kids became teenagers and she relapsed into smoking.  She is now smoking 10 cigarettes/day.  We discussed Chantix  and Zyban she does not want to do this.  She says she will try willpower..  She smokes with the left hand she smokes outdoors.  I advised her to cut down by 1 cigarette/week.  Also try using her right hand and try different places to smoke. she is going to try some of the strategies. Tital time spent on this >=  Social: Her daughter  Rock had some breast tumors removed fortunately they were benign.  But she also tore her ankle ligaments and is in a brace.   OV 11/10/2023  Subjective:  Patient ID: Dayonna Selbe, female , DOB: 1946-03-15 , age 32 y.o. , MRN: [id number] , ADDRESS: [address] [place] [address] PCP Tonita Fallow, MD Patient Care Team: Tonita Fallow, MD as PCP - General (Internal Medicine) Teressa Toribio SQUIBB, MD as Attending Physician (Gastroenterology) Jakie Alm SAUNDERS, MD as Consulting Physician (Gastroenterology) Ethyl Lonni BRAVO, MD (Inactive) as Consulting Physician (Otolaryngology) Ivin Kocher, MD as Consulting Physician (Dermatology) Brenna Adine CROME, DO as Consulting Physician (Pulmonary Disease)  This Provider for this visit: Treatment Team:  Attending Provider: Geronimo Amel, MD     11/10/2023 -   Chief Complaint  Patient presents with   Follow-up    PFT's. Breathing has been stable and she is not coughing much.      HPI Sanora Cunanan 79 y.o. -presents with daughter Rock for follow-up.  Rock herself had an ankle tear and she is recovering.  Patient herself in the interim while [place] science [place] on 10[place]26/2023 tripped and fell and fractured the right kneecap.  She had surgery on 10/30/2023 and is recovering.  Chart review done and external record.  And diagnosis of right patella fracture confirmed and she had open reduction internal fixation.  She has a brace on Dr. Rock is worried about DVT risk and is concerned the patient may not be applying her TED stockings correctly patient is brought in wheelchair today because of the right knee issue.  Rock is an independent historian today.  IPF: Feels stable.  Pulmonary function test are stable.  Last CT was in spring 2024.  And then also in July 2024.  According to the radiologist there is no progression.  Personally visualized it and I do agree with it.  Therapeutic monitoring with  pirfenidone : Liver function test normal October 2024 not much side effects at all.  Tolerating it well as a low-dose protocol.  She did have a skin rash last time but it appears this is because of Crestor .  She is now on Crestor  only 3 times a week and the skin rash is warm.  Smoking: She continues to smoke.  After the fall she quit smoking on 10/30/2023.  Asking for nicotine  patch.  Advised to get it over-the-counter.     OV 08/23/2024  Subjective:  Patient ID: Kanyon Bunn, female , DOB: 1946-05-20 , age 8 y.o. , MRN: [id number] , ADDRESS: [address] [place] [address] PCP Aletha Bene, MD Patient Care Team: Aletha Bene, MD as PCP - General (Family Medicine) Teressa Toribio SQUIBB, MD (Inactive) as Attending Physician (Gastroenterology) Jakie Alm SAUNDERS, MD as Consulting Physician (Gastroenterology) Ethyl Lonni BRAVO, MD (Inactive) as Consulting Physician (Otolaryngology) Ivin Kocher, MD as Consulting Physician (Dermatology) Brenna Adine CROME, DO as Consulting Physician (Pulmonary Disease)  This Provider for this visit: Treatment Team:  Attending Provider: Geronimo Amel, MD    08/23/2024 -   Chief Complaint  Patient presents with   Medical Management of Chronic Issues   Interstitial Lung Disease    Breathing has been overall stable. She has occ non prod cough.    HPI Courtnay Petrilla 80 y.o. -returns for follow-up.  Since I last saw her on her birthday on June 17, 2026 she ended up with intraparenchymal hemorrhage.  Course was complicated by hospital delirium.  She has been discharged.  At this point in time she is back to baseline almost.  There are some mild short-term memory deficit but the daughter thinks it is almost to baseline.  Be going to see a neuro-ophthalmologist to make sure she can drive.  She is doing her ADLs.  And otherwise doing well.  Patient also endorses the same.  The daughter is independent historian here  In terms of IPF:  She feels stable: She did have pulmonary function test but the DLCO is declined.  Last CT scan personally visualized was done May 2025 and a slight progression compared to 2023.  The DLCO also reflects that.  But she is feeling good.  Esbriet : She is tolerating this at low-dose.  Liver function test is normal  Smoking: She is again trying to quit she met with primary care physician and she started on Chantix .  She is aware about the risk of progression of ILD with smoking and also increased stroke risk cardiac risk etc.       OV 01/05/2025  Subjective:  Patient ID: Alison Campbell, female , DOB: 1946/09/12 , age 51 y.o. , MRN: [id number] , ADDRESS: [address] [place] [address] PCP Aletha Bene, MD Patient Care Team: Aletha Bene, MD as PCP - General (Family Medicine) Teressa Toribio SQUIBB, MD (Inactive) as Attending Physician (Gastroenterology) Jakie Alm SAUNDERS, MD as Consulting Physician (Gastroenterology) Ethyl Lonni BRAVO, MD (Inactive) as Consulting Physician (Otolaryngology) Ivin Kocher, MD as Consulting Physician (Dermatology) Brenna Adine CROME, DO as Consulting Physician (Pulmonary Disease)  This Provider for this visit: Treatment Team:  Attending Provider: Geronimo Amel, MD    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  - PFt May 2024 /NOv 2024  -Started pirfenidone  towards the end of 2023 -low-dose protocol.  - Slowly progressive as of high-resolution CT chest May 2025 2025  Mild associated emphysema   Coronary artery calcification -seen cardiology Dr. Jeffrie Jeffrie in 2022   Quit smoking 2023-relapsed as of January 2024. Lung cancer screening  - No evidence of lung cancer on CT chest June 2025  Currently Esbriet /Pirfenidone  requires intensive drug monitoring due to high concerns for Adverse effects of ,  including  Drug Induced Liver Injury, significant GI side effects that include but not limited to Diarrhea, Nausea, Vomiting,  and other system side effects that include Fatigue, headaches, weight loss and other side effects such as skin rash. These will be monitored with  blood work such as LFT initially once a month for 6 months and then quarterly  #Screening for pulmonary hypertension  - Normal echo June 2025   01/05/2025 -   Chief Complaint  Patient presents with   Medical Management of Chronic Issues   Interstitial Lung Disease    Breathing doing well overall no new concerns.      HPI Kylinn Shropshire 79 y.o. -ara Aiyanah Kalama is an 79 year old female with IPFpulmonary fibrosis who presents for follow-up of her pulmonary condition. SHE is on Esbret. he needs to eat something when taking it due to her irregular meal schedule. No sunburn has been experienced. She has quit smoking and transitioned to vaping at the lowest dose, having not smoked a cigarette for a couple of months. She reports full recovery from her strokes, which she was previously unaware of until diagnosed. No new health issues, hospitalizations, emergency room visits, urgent care visits, or surgeries have occurred since her last visit in August. Symtpom score blow  Her social history includes celebrating Christmas and [place] Year with her family, noting that her  son's birthday is on New Year's Day.      SYMPTOM SCALE - ILD 07/03/2022 10/01/2022  05/21/2023  139# 11/10/2023 136# 08/23/2024 134# Low dose esbreit Reducing smoking Chantix  + 01/05/2025 Low esbreit Stopped tobacoo Low dose vaping  Current weight        O2 use ra ra ra ra ra ra  Shortness of Breath 0 -> 5 scale with 5 being worst (score 6 If unable to do)   0    At rest 0 0 0 0 0 0  Simple tasks - showers, clothes change, eating, shaving 1 1 0 1/5 0 0  Household (dishes, doing bed, laundry) 0 2 1 3 1  0  Shopping 0 2 0 2 0 x  Walking level at own pace 0 3  0 1 0 0  Walking up Stairs 0 1 1 0 1 3  Total (30-36) Dyspnea Score 1 9 2 8 2 3       Non-dyspnea symptoms (0-> 5 scale) 07/03/2022 10/01/2022  05/21/2023   11/10/2023  08/23/2024  01/05/2025   How bad is your cough? 1 0 0 1 0 na  How bad is your fatigue 2 2 2 3 1 4  - esbire  How bad is nausea 2 0 0 0 0 0  How bad is vomiting?  0 0 0 0 0 0  How bad is diarrhea? 0 00 0 0 0 0  How bad is anxiety? 0 0 0 1 0 00  How bad is depression 0 0 0 0 0 0  Any chronic pain - if so where and how bad 0 0 x  0 0   Simple office walk 185 feet x  3 laps goal with forehead probe 07/03/2022  11/10/2023 Rt knee issue and on wheel cahir  O2 used ra   Number laps completed 3   Comments about pace avg   Resting Pulse Ox/HR 98% and 69/min   Final Pulse Ox/HR 96% and 96/min   Desaturated </= 88% no   Desaturated <= 3% points no   Got Tachycardic >/= 90/min yes   Symptoms at end of test No complaints   Miscellaneous comments x    PFT     Latest Ref Rng & Units 08/23/2024    1:40 PM 11/10/2023    8:42 AM 05/19/2023    3:28 PM 05/07/2022    1:36 PM  PFT Results  FVC-Pre L 2.51  2.48  2.26  1.97   FVC-Predicted Pre % 105  98  88  75   FVC-Post L    2.02   FVC-Predicted Post %    77   Pre FEV1/FVC % % 78  80  72  82   Post FEV1/FCV % %    77   FEV1-Pre L 1.96  1.98  1.63  1.62   FEV1-Predicted Pre % 111  105  85  83   FEV1-Post L    1.55   DLCO uncorrected ml/min/mmHg 10.80  14.88  12.06  12.18   DLCO UNC% % 61  81  65  66   DLCO corrected ml/min/mmHg 10.91  14.46  12.10  11.74   DLCO COR %Predicted % 62  79  66  63   DLVA Predicted % 74  90  88  87   TLC L    4.74   TLC % Predicted %    96   RV % Predicted %    118  LAB RESULTS last 96 hours No results found.       has a past medical history of Allergy (06/08/24), Anxiety, Atherosclerosis, Cataract (08/01/2024), CKD (chronic kidney disease) stage 3, GFR 30-59 ml/min (HCC) (12/24/2020), Colon polyps, COPD (chronic obstructive  pulmonary disease) (HCC), Costochondritis, Elevated blood pressure reading, Emphysema of lung (HCC), GERD (gastroesophageal reflux disease), Hyperlipidemia, Hypertension, Insomnia, Osteoporosis, Pre-diabetes, and Stroke (HCC) (06/07/2024).   reports that she quit smoking about 2 years ago. Her smoking use included cigarettes. She started smoking about 60 years ago. She has a 57.6 pack-year smoking history. She has never used smokeless tobacco.  Past Surgical History:  Procedure Laterality Date   ABDOMINAL HYSTERECTOMY     COLONOSCOPY  09/10/2011   hysterectomy     INTERCOSTAL NERVE BLOCK Right 08/15/2022   Procedure: INTERCOSTAL NERVE BLOCK;  Surgeon: Kerrin Elspeth BROCKS, MD;  Location: [facility] OR;  Service: Thoracic;  Laterality: Right;   LUNG BIOPSY Right 08/15/2022   Procedure: LUNG BIOPSY;  Surgeon: Kerrin Elspeth BROCKS, MD;  Location: [facility] OR;  Service: Thoracic;  Laterality: Right;   ORIF PATELLA Right 10/30/2023   Procedure: OPEN REDUCTION INTERNAL FIXATION (ORIF) PATELLA;  Surgeon: Kay Kemps, MD;  Location: [facility] ORS;  Service: Orthopedics;  Laterality: Right;  90   POLYPECTOMY     UVULECTOMY N/A 09/09/2016   Procedure: PARTIAL UVULECTOMY, direct laryngoscopy;  Surgeon: Lonni FORBES Angle, MD;  Location:  SURGERY CENTER;  Service: ENT;  Laterality: N/A;  PARTIAL UVULECTOMY, direct laryngoscopy    Allergies[1]  Immunization History  Administered Date(s) Administered   Fluad Quad(high Dose 65+) 09/25/2022   INFLUENZA, HIGH DOSE SEASONAL PF 08/29/2013, 10/25/2015, 11/24/2017, 11/29/2018, 10/15/2021, 10/22/2023, 12/12/2024   Influenza,inj,Quad PF,6+ Mos 10/31/2022   PFIZER(Purple Top)SARS-COV-2 Vaccination 02/02/2020, 02/28/2020   Pneumococcal Conjugate-13 02/02/2015   Pneumococcal Polysaccharide-23 03/29/2010   Tdap 02/26/2009, 10/31/2022   Zoster, Live 12/29/2008    Family History  Problem Relation Age of Onset   Heart disease Mother    Heart attack Mother    Heart  disease Father    Diabetes Father    Heart attack Father    Cancer Brother        Lung   Bipolar disorder Daughter    Colon cancer Neg Hx    Esophageal cancer Neg Hx    Stomach cancer Neg Hx    Rectal cancer Neg Hx     Current Medications[2]      Objective:   Vitals:   01/05/25 1135  BP: 124/68  Pulse: 85  SpO2: 98%  Weight: 135 lb (61.2 kg)  Height: 5' 3 (1.6 m)    Estimated body mass index is 23.91 kg/m as calculated from the following:   Height as of this encounter: 5' 3 (1.6 m).   Weight as of this encounter: 135 lb (61.2 kg).  @WEIGHTCHANGE @  [employer]   01/05/25 1135  Weight: 135 lb (61.2 kg)     Physical Exam   General: No distress. Looks well O2 at rest: no Cane present: no Sitting in wheel chair: ono Frail: no Obese: no Neuro: Alert and Oriented x 3. GCS 15. Speech normal Psych: Pleasant Resp:  Barrel Chest - nono.  Wheeze - no, Crackles - mild mayb, No overt respiratory distress CVS: Normal heart sounds. Murmurs - no Ext: Stigmata of Connective Tissue Disease - no HEENT: Normal upper airway. PEERL +. No post nasal drip        Assessment/     Assessment & Plan IPF (idiopathic  pulmonary fibrosis) (HCC)  Encounter for therapeutic drug monitoring    PLAN Patient Instructions  IPF (idiopathic pulmonary fibrosis) (HCC) Encounter for therapeutic drug monitoring   -Pulmonary fibrosis is slowly progressive since 2023 > Aug 2025 -Liver function test normal on pirfenidone   Dec 2025 -Overall tolerating pirfenidone  well; LOW Dose protocol   Plan  - START JASCAYD - - the day you start this STOP Pirfenidone  but till then continue pirfenidone  as below  - doing this roll over due to convenience  -Cotinue pirfenidone   LOW dose protocol  2 pills 3 times daily with food but you can try without food but overall do not recommed thatg -Reminder for you to apply sunscreen to the skin and sun exposed areas even when you go out for 5  minutes   Smoker  - GLAD QUIT TOBACCO and now on low dose vape  Plan  - slowly work on quitting vape  Lung cancer screening   - No evidence of lung cancer on recent CT scan chest May 2024 and May 79774  Plan -Capture any lung cancer screening information on future CT scan of the chest for pulmonary fibrosis  - aim summer 2026    Follow-up - 15 minute visit with Dr. Geronimo in 6 months after hrct   -Symptom score and exercise hypoxemia test at follow-up    FOLLOWUP    No follow-ups on file.    SIGNATURE    Dr. Dorethia Geronimo, M.D., F.C.C.P,  Pulmonary and Critical Care Medicine Staff Physician, [facility] Director - Interstitial Lung Disease  Program  Pulmonary Fibrosis [facility] [place], [place], [address]  Pager: [phone], If no answer or between  15:00h - 7:00h: call [phone] Telephone: [phone]  11:59 AM 01/05/2025    [1]  Allergies Allergen Reactions   Iodinated Contrast Media Other (See Comments)    Pt states she gets viscous when contrast is administered.    Omnipaque  [Iohexol ] Other (See Comments)    EXTREME AGITATION   Fish Oil Diarrhea   Spiriva  Respimat [Tiotropium Bromide] Cough    Gaging and excessive phlegm  [2]  Current Outpatient Medications:    acetaminophen  (TYLENOL ) 500 MG tablet, Take 1,000 mg by mouth daily as needed for mild pain (pain score 1-3), moderate pain (pain score 4-6) or headache., Disp: , Rfl:    ALPRAZolam  (XANAX ) 0.5 MG tablet, Take 1 tablet (0.5 mg total) by mouth at bedtime as needed for anxiety. Take 1-2 tablets 30 minutes prior to MRI, may repeat once as needed. Must have driver., Disp: 3 tablet, Rfl: 0   amLODipine  (NORVASC ) 10 MG tablet, Take 1 tablet (10 mg total) by mouth daily., Disp: 30 tablet, Rfl: 3   aspirin  81 MG chewable tablet, Chew 81 mg by mouth daily., Disp: , Rfl:    Calcium  Carb-Cholecalciferol  (CALCIUM  600 + D PO), Take 2  tablets by mouth daily., Disp: , Rfl:    cholecalciferol  (VITAMIN D3) 25 MCG (1000 UNIT) tablet, Take 5,000 Units by mouth daily., Disp: , Rfl:    ezetimibe  (ZETIA ) 10 MG tablet, TAKE 1 TABLET BY MOUTH DAILY FOR CHOLESTEROL, Disp: 90 tablet, Rfl: 3   Multiple Vitamin (MULTIVITAMIN PO), Take 1 tablet by mouth daily., Disp: , Rfl:    Pirfenidone  267 MG TABS, Take 2 tablets (534 mg total) by mouth with breakfast, with lunch, and with evening meal., Disp: 180 tablet, Rfl: 2   Psyllium (METAMUCIL FIBER  PO), Take 1 tablet by mouth daily., Disp: , Rfl:    QUEtiapine  (SEROQUEL ) 50 MG tablet, Take 1 tablet (50 mg total) by mouth at bedtime., Disp: 90 tablet, Rfl: 1   rosuvastatin  (CRESTOR ) 10 MG tablet, Take 1 tablet (10 mg total) by mouth daily., Disp: 90 tablet, Rfl: 3   varenicline  (CHANTIX ) 0.5 MG tablet, Take 1 tablet (0.5 mg total) by mouth 2 (two) times daily. (Patient not taking: Reported on 01/05/2025), Disp: 60 tablet, Rfl: 2   Varenicline  Tartrate, Starter, (CHANTIX  STARTING MONTH PAK) 0.5 MG X 11 & 1 MG X 42 TBPK, Starter pack (Patient not taking: Reported on 01/05/2025), Disp: 53 each, Rfl: 0  "

## 2025-01-04 NOTE — Patient Instructions (Addendum)
 IPF (idiopathic pulmonary fibrosis) (HCC) Encounter for therapeutic drug monitoring   -Pulmonary fibrosis is slowly progressive since 2023 > Aug 2025 -Liver function test normal on pirfenidone   Dec 2025 -Overall tolerating pirfenidone  well; LOW Dose protocol   Plan  - START JASCAYD - - the day you start this STOP Pirfenidone  but till then continue pirfenidone  as below  - doing this roll over due to convenience  -Cotinue pirfenidone   LOW dose protocol  2 pills 3 times daily with food but you can try without food but overall do not recommed thatg -Reminder for you to apply sunscreen to the skin and sun exposed areas even when you go out for 5 minutes   Smoker  - GLAD QUIT TOBACCO and now on low dose vape  Plan  - slowly work on quitting vape  Lung cancer screening   - No evidence of lung cancer on recent CT scan chest May 2024 and May 79[address]  Plan -Capture any lung cancer screening information on future CT scan of the chest for pulmonary fibrosis  - aim summer 2026    Follow-up - 15 minute visit with Dr. Geronimo in 6 months after hrct   -Symptom score and exercise hypoxemia test at follow-up

## 2025-01-05 ENCOUNTER — Encounter: Payer: Self-pay | Admitting: Internal Medicine

## 2025-01-05 ENCOUNTER — Ambulatory Visit (INDEPENDENT_AMBULATORY_CARE_PROVIDER_SITE_OTHER): Admitting: Internal Medicine

## 2025-01-05 VITALS — BP 124/68 | HR 85 | Ht 63.0 in | Wt 135.0 lb

## 2025-01-05 DIAGNOSIS — Z5181 Encounter for therapeutic drug level monitoring: Secondary | ICD-10-CM

## 2025-01-05 DIAGNOSIS — F17290 Nicotine dependence, other tobacco product, uncomplicated: Secondary | ICD-10-CM

## 2025-01-05 DIAGNOSIS — J84112 Idiopathic pulmonary fibrosis: Secondary | ICD-10-CM | POA: Diagnosis not present

## 2025-01-06 ENCOUNTER — Telehealth: Payer: Self-pay

## 2025-01-06 ENCOUNTER — Other Ambulatory Visit: Payer: Self-pay

## 2025-01-06 NOTE — Telephone Encounter (Signed)
 Received Alison Campbell new start paperwork. Opening benefits investigation in this thread, updates to follow.

## 2025-01-09 ENCOUNTER — Other Ambulatory Visit: Payer: Self-pay

## 2025-01-09 ENCOUNTER — Other Ambulatory Visit (HOSPITAL_COMMUNITY): Payer: Self-pay

## 2025-01-09 NOTE — Progress Notes (Signed)
 Specialty Pharmacy Refill Coordination Note  Alison Campbell is a 79 y.o. female contacted today regarding refills of specialty medication(s) Pirfenidone    Patient requested Delivery   Delivery date: 01/11/25   Verified address: [address], [place]. [address]   Medication will be filled on: 01/10/25

## 2025-01-09 NOTE — Telephone Encounter (Signed)
 Submitted a Prior Authorization request to [employer] for JASCAYD via CoverMyMeds. Authorization has been CANCELLED due to pa not needed.    Test claim unsuccessful due to cost exceeds maximum rejection. Will contact [employer] to see if they can override.  CMM Key: [id number]

## 2025-01-10 ENCOUNTER — Other Ambulatory Visit: Payer: Self-pay

## 2025-01-11 ENCOUNTER — Telehealth: Payer: Self-pay

## 2025-01-11 NOTE — Telephone Encounter (Signed)
 Copied from CRM [id number]. Topic: Clinical - Prescription Issue >> Jan 09, 2025 11:13 AM Benton KIDD wrote: Reason for CRM: patient is calling because dr geronimo was sending in a new prescription for patient which she doesn't remember the name . Patient is wanting to speak with doctor to see which pharmacy medication will be sent to . Please give patient a call back  [phone]  ATC X1. LMTCB.

## 2025-01-12 ENCOUNTER — Other Ambulatory Visit (HOSPITAL_COMMUNITY): Payer: Self-pay

## 2025-01-12 NOTE — Telephone Encounter (Signed)
 Called [employer] to request override, per rep the pharmacy must reach out and complete override process. Will need rx sent to [facility] to get live claim and coordinate with pharmacy to get override.  Called pt and advised her spec meds can take a few weeks to process. Advised her it is approved with her insurance but we need to resolve rejection. Will message her with my phone number and keep her updated. Pt verbalized her understanding.

## 2025-01-12 NOTE — Telephone Encounter (Signed)
 Good morning, patient is calling about the JASCAYD, can you please contact patient with update?  Thank you.

## 2025-01-17 ENCOUNTER — Ambulatory Visit: Attending: Internal Medicine

## 2025-01-17 DIAGNOSIS — J849 Interstitial pulmonary disease, unspecified: Secondary | ICD-10-CM

## 2025-01-17 MED ORDER — JASCAYD 18 MG PO TABS
18.0000 mg | ORAL_TABLET | Freq: Two times a day (BID) | ORAL | 5 refills | Status: AC
  Filled 2025-01-26: qty 60, 30d supply, fill #0

## 2025-01-17 NOTE — Progress Notes (Cosign Needed)
 [facility]  Referring Provider: Dr. Geronimo  Virtual Visit via Telephone Note  I connected with Alison Campbell on 01/17/25 at  2:00 PM EST by telephone and verified that I am speaking with the correct person using two identifiers.  Location: Patient: home Provider: office   I discussed the limitations, risks, security and privacy concerns of performing an evaluation and management service by telephone and the availability of in person appointments. I also discussed with the patient that there may be a patient responsible charge related to this service. The patient expressed understanding and agreed to proceed.  Subjective:  Patient called today by [facility] team for Jascayd  new start.  Patient was last seen by Dr. Geronimo on 01/05/25.  Pertinent past medical history includes IPF.   Currently taking antifibrotic: pirfenidone  low-dose protocol  Objective: Allergies[1]  Outpatient Encounter Medications as of 01/17/2025  Medication Sig   nerandomilast  (JASCAYD ) 18 MG tablet Take 18 mg by mouth in the morning and at bedtime.   acetaminophen  (TYLENOL ) 500 MG tablet Take 1,000 mg by mouth daily as needed for mild pain (pain score 1-3), moderate pain (pain score 4-6) or headache.   ALPRAZolam  (XANAX ) 0.5 MG tablet Take 1 tablet (0.5 mg total) by mouth at bedtime as needed for anxiety. Take 1-2 tablets 30 minutes prior to MRI, may repeat once as needed. Must have driver.   amLODipine  (NORVASC ) 10 MG tablet Take 1 tablet (10 mg total) by mouth daily.   aspirin  81 MG chewable tablet Chew 81 mg by mouth daily.   Calcium  Carb-Cholecalciferol  (CALCIUM  600 + D PO) Take 2 tablets by mouth daily.   cholecalciferol  (VITAMIN D3) 25 MCG (1000 UNIT) tablet Take 5,000 Units by mouth daily.   ezetimibe  (ZETIA ) 10 MG tablet TAKE 1 TABLET BY MOUTH DAILY FOR CHOLESTEROL   Multiple Vitamin (MULTIVITAMIN PO) Take 1 tablet by mouth daily.   Pirfenidone  267 MG  TABS Take 2 tablets (534 mg total) by mouth with breakfast, with lunch, and with evening meal.   Psyllium (METAMUCIL FIBER PO) Take 1 tablet by mouth daily.   QUEtiapine  (SEROQUEL ) 50 MG tablet Take 1 tablet (50 mg total) by mouth at bedtime.   rosuvastatin  (CRESTOR ) 10 MG tablet Take 1 tablet (10 mg total) by mouth daily.   varenicline  (CHANTIX ) 0.5 MG tablet Take 1 tablet (0.5 mg total) by mouth 2 (two) times daily. (Patient not taking: Reported on 01/05/2025)   Varenicline  Tartrate, Starter, (CHANTIX  STARTING MONTH PAK) 0.5 MG X 11 & 1 MG X 42 TBPK Starter pack (Patient not taking: Reported on 01/05/2025)   No facility-administered encounter medications on file as of 01/17/2025.     Immunization History  Administered Date(s) Administered   Fluad Quad(high Dose 65+) 09/25/2022   INFLUENZA, HIGH DOSE SEASONAL PF 08/29/2013, 10/25/2015, 11/24/2017, 11/29/2018, 10/15/2021, 10/22/2023, 12/12/2024   Influenza,inj,Quad PF,6+ Mos 10/31/2022   PFIZER(Purple Top)SARS-COV-2 Vaccination 02/02/2020, 02/28/2020   Pneumococcal Conjugate-13 02/02/2015   Pneumococcal Polysaccharide-23 03/29/2010   Tdap 02/26/2009, 10/31/2022   Zoster, Live 12/29/2008      PFT's TLC  Date Value Ref Range Status  05/07/2022 4.74 L Final      CMP     Component Value Date/Time   NA 143 12/12/2024 0826   K 4.0 12/12/2024 0826   CL 105 12/12/2024 0826   CO2 29 12/12/2024 0826   GLUCOSE 92 12/12/2024 0826   BUN 18 12/12/2024 0826   CREATININE 0.95 12/12/2024 0826   CALCIUM  9.7 12/12/2024 0826  PROT 6.8 12/12/2024 0826   PROT 7.0 09/01/2023 0913   ALBUMIN 3.6 06/14/2024 1910   ALBUMIN 4.4 09/01/2023 0913   AST 15 12/12/2024 0826   ALT 20 12/12/2024 0826   ALKPHOS 62 06/14/2024 1910   BILITOT 0.3 12/12/2024 0826   BILITOT 0.2 09/01/2023 0913   GFRNONAA 51 (L) 06/14/2024 1910   GFRNONAA 48 (L) 06/18/2020 0959   GFRAA 56 (L) 06/18/2020 0959    HRCT (ordered 01/05/25)  Assessment and Plan  Jascayd   Medication Management Thoroughly counseled patient on the efficacy, mechanism of action, dosing, administration, adverse effects, and monitoring parameters of Jascayd .  Patient verbalized understanding.   Goals of Therapy: Will not stop or reverse the progression of ILD. It will slow the progression of ILD.   Dosing: Recommended dose will be 18mg  tablet, Take 1 tablet twice daily. May be administered with or without regard to food.   Adverse Effects: Weight loss (nerandomilast  monotherapy: 8%; background nintedanib: 14-16%; background pirfenidone : 6%) Decreased appetite (nerandomilast  monotherapy: 6% to 9%; concomitant nintedanib: 7% to 10%; concomitant pirfenidone : 13%) Diarrhea (nerandomilast  monotherapy: 17% to 26%; concomitant nintedanib: 50% to 62%; concomitant pirfenidone : 24%)  Monitoring: Monitor for diarrhea, decreased appetite, weight loss  Drug interactions: nerandomilast  is a major substrate of CYP3A4. Avoid use of moderate and strong CYP3A4 inducers or inhibitors.  No documented current use of moderate and strong CYP3A4 inducers or inhibitors.   Access: Approval of Jascayd  through: insurance Rx sent to: [facility]: [phone]    PLAN:  - Per Dr. Reeves instruction, stop Esbriet  (pirfenidone ) the day you start Jascayd  - START Jascayd  18mg  tablet, Take 1 tablet twice daily. - Follow-up with Dr. Geronimo as recommended in June or July after HRCT  Thank you for involving pharmacy to assist in providing this patient's care.   I discussed the assessment and treatment plan with the patient. The patient was provided an opportunity to ask questions and all were answered. The patient agreed with the plan and demonstrated an understanding of the instructions.   The patient was advised to call back or seek an in-person evaluation if the symptoms worsen or if the condition fails to improve as anticipated.  I provided 10 minutes of non-face-to-face time  during this encounter.  Aleck Puls, PharmD, BCPS, CPP Clinical Pharmacist  [facility]     [1]  Allergies Allergen Reactions   Iodinated Contrast Media Other (See Comments)    Pt states she gets viscous when contrast is administered.    Omnipaque  [Iohexol ] Other (See Comments)    EXTREME AGITATION   Fish Oil Diarrhea   Spiriva  Respimat [Tiotropium Bromide] Cough    Gaging and excessive phlegm

## 2025-01-17 NOTE — Telephone Encounter (Signed)
 See pharmacotherapy visit 01/17/25 - initial counseling complete and Rx triaged to [facility].

## 2025-01-20 ENCOUNTER — Telehealth: Payer: Self-pay | Admitting: *Deleted

## 2025-01-20 NOTE — Telephone Encounter (Signed)
 I believe medication is Jascayd  Did anyone try to contact this patient?  Copied from CRM #[id number]. Topic: Clinical - Prescription Issue >> Jan 19, 2025 12:34 PM Rilla B wrote: Reason for CRM: Patient returning call regarding a prescription.  States they did not leave a name and unsure what day they called. Please call patient @ [phone]   ----------------------------------------------------------------------- From previous Reason for Contact - Other: Reason for CRM:

## 2025-01-23 NOTE — Telephone Encounter (Signed)
 Patient is returning a call from the 14th . She called back but no one has reached back out to patient concerning the call she received about medication . Please give patient a call back concerning the call she received on the 14th about a medication  [phone]

## 2025-01-26 ENCOUNTER — Other Ambulatory Visit: Payer: Self-pay

## 2025-01-26 ENCOUNTER — Other Ambulatory Visit (HOSPITAL_COMMUNITY): Payer: Self-pay

## 2025-01-26 NOTE — Progress Notes (Signed)
 Specialty Pharmacy Initial Fill Coordination Note  Alison Campbell is a 79 y.o. female contacted today regarding initial fill of specialty medication(s) Nerandomilast  (Jascayd )   Patient requested Delivery   Delivery date: 01/31/25   Verified address: [address], [place], [place] [address]   Medication will be filled on: 01/27/25   Patient is aware of $0 copayment.    **Pt is aware may be delayed due to weather**

## 2025-01-26 NOTE — Telephone Encounter (Signed)
 Rx was sent to [facility] and rejected for cost exceeds maximum. Contacted [employer] at [phone] and was able to get authorization to allow claim to go through. Per test claim copay for 30 day supply is $85.  Authorization #: [id number]   Pt enrolled in Pulmonary Fibrosis grant through PAF:  Amount: $5000 Award Period: 07/30/24 - 01/26/26 BIN: [id number] PCN: [id number] Group: [id number] ID: [id number]  For pharmacy inquiries, contact PDMI at [phone]. For patient inquiries, contact PAF at [phone].

## 2025-01-26 NOTE — Telephone Encounter (Signed)
 I called and spoke with patient, provided information in the note from pharmacy:  Jenny Monette BIRCH, [facility]    01/26/25  1:44 PM Note Rx was sent to [facility] and rejected for cost exceeds maximum. Contacted [employer] at [phone] and was able to get authorization to allow claim to go through. Per test claim copay for 30 day supply is $85.   Authorization #: [id number]     Pt enrolled in Pulmonary Fibrosis grant through PAF:   Amount: $5000 Award Period: 07/30/24 - 01/26/26 BIN: [id number] PCN: [id number] Group: [id number] ID: [id number]   For pharmacy inquiries, contact PDMI at [phone]. For patient inquiries, contact PAF at [phone].      Chasadee, Can you call and speak with patient and explain how the grant works and next steps?  Thank you.

## 2025-01-27 ENCOUNTER — Other Ambulatory Visit: Payer: Self-pay

## 2025-01-27 NOTE — Telephone Encounter (Signed)
 Pt was contacted yesterday and gave her details about process for filling Jascayd . NFN

## 2025-01-27 NOTE — Telephone Encounter (Signed)
 Resolved Alison Campbell is a 79 y.o. female contacted today regarding initial fill of specialty medication(s) Nerandomilast  (Jascayd )     Patient requested Delivery   Delivery date: 01/31/25

## 2025-01-31 NOTE — Progress Notes (Signed)
 Please see pharmacotherapy visit note 01/17/25 -   Patient is transitioning from pirfenidone  to Jascayd .   Complete education provided on Jascayd  prior to first dispense.   Aleck Puls, PharmD, BCPS, CPP Clinical Pharmacist  [facility]  [facility]

## 2025-03-06 ENCOUNTER — Ambulatory Visit: Admitting: Neurology
